# Patient Record
Sex: Female | Born: 1970 | Hispanic: Yes | Marital: Single | Smoking: Never smoker
Health system: Southern US, Community
[De-identification: ages and names within clinical notes are randomized; demographics above are authoritative.]

## PROBLEM LIST (undated history)

## (undated) DIAGNOSIS — G4730 Sleep apnea, unspecified: Secondary | ICD-10-CM

## (undated) DIAGNOSIS — G8929 Other chronic pain: Secondary | ICD-10-CM

## (undated) DIAGNOSIS — F419 Anxiety disorder, unspecified: Secondary | ICD-10-CM

## (undated) DIAGNOSIS — N2889 Other specified disorders of kidney and ureter: Secondary | ICD-10-CM

## (undated) DIAGNOSIS — J45909 Unspecified asthma, uncomplicated: Secondary | ICD-10-CM

## (undated) DIAGNOSIS — K219 Gastro-esophageal reflux disease without esophagitis: Secondary | ICD-10-CM

## (undated) DIAGNOSIS — L509 Urticaria, unspecified: Secondary | ICD-10-CM

## (undated) DIAGNOSIS — F4310 Post-traumatic stress disorder, unspecified: Secondary | ICD-10-CM

## (undated) DIAGNOSIS — D649 Anemia, unspecified: Secondary | ICD-10-CM

## (undated) DIAGNOSIS — T8859XA Other complications of anesthesia, initial encounter: Secondary | ICD-10-CM

## (undated) DIAGNOSIS — M329 Systemic lupus erythematosus, unspecified: Secondary | ICD-10-CM

## (undated) DIAGNOSIS — G43909 Migraine, unspecified, not intractable, without status migrainosus: Secondary | ICD-10-CM

## (undated) DIAGNOSIS — M24312 Pathological dislocation of left shoulder, not elsewhere classified: Secondary | ICD-10-CM

## (undated) DIAGNOSIS — A159 Respiratory tuberculosis unspecified: Secondary | ICD-10-CM

## (undated) DIAGNOSIS — C801 Malignant (primary) neoplasm, unspecified: Secondary | ICD-10-CM

## (undated) DIAGNOSIS — J189 Pneumonia, unspecified organism: Secondary | ICD-10-CM

## (undated) DIAGNOSIS — Q652 Congenital dislocation of hip, unspecified: Secondary | ICD-10-CM

## (undated) DIAGNOSIS — M810 Age-related osteoporosis without current pathological fracture: Secondary | ICD-10-CM

## (undated) DIAGNOSIS — M1990 Unspecified osteoarthritis, unspecified site: Secondary | ICD-10-CM

## (undated) DIAGNOSIS — F32A Depression, unspecified: Secondary | ICD-10-CM

## (undated) DIAGNOSIS — D509 Iron deficiency anemia, unspecified: Secondary | ICD-10-CM

## (undated) DIAGNOSIS — M24311 Pathological dislocation of right shoulder, not elsewhere classified: Secondary | ICD-10-CM

## (undated) HISTORY — DX: Gastro-esophageal reflux disease without esophagitis: K21.9

## (undated) HISTORY — DX: Anemia, unspecified: D64.9

## (undated) HISTORY — DX: Unspecified asthma, uncomplicated: J45.909

## (undated) HISTORY — DX: Iron deficiency anemia, unspecified: D50.9

## (undated) HISTORY — DX: Other specified disorders of kidney and ureter: N28.89

## (undated) HISTORY — DX: Urticaria, unspecified: L50.9

## (undated) HISTORY — DX: Other chronic pain: G89.29

## (undated) HISTORY — DX: Age-related osteoporosis without current pathological fracture: M81.0

## (undated) HISTORY — DX: Systemic lupus erythematosus, unspecified: M32.9

## (undated) HISTORY — PX: TOTAL HIP ARTHROPLASTY: SHX124

## (undated) HISTORY — PX: HIP SURGERY: SHX245

## (undated) HISTORY — DX: Post-traumatic stress disorder, unspecified: F43.10

## (undated) HISTORY — DX: Pathological dislocation of right shoulder, not elsewhere classified: M24.311

## (undated) HISTORY — DX: Migraine, unspecified, not intractable, without status migrainosus: G43.909

## (undated) HISTORY — DX: Pathological dislocation of right shoulder, not elsewhere classified: M24.312

## (undated) HISTORY — DX: Congenital dislocation of hip, unspecified: Q65.2

## (undated) HISTORY — DX: Sleep apnea, unspecified: G47.30

## (undated) HISTORY — DX: Anxiety disorder, unspecified: F41.9

---

## 2011-11-01 HISTORY — PX: STAPEDECTOMY: SHX2435

## 2012-03-08 HISTORY — PX: LAPAROSCOPIC GASTRIC SLEEVE RESECTION: SHX5895

## 2015-03-09 DIAGNOSIS — N2889 Other specified disorders of kidney and ureter: Secondary | ICD-10-CM

## 2015-03-09 HISTORY — DX: Other specified disorders of kidney and ureter: N28.89

## 2016-11-15 ENCOUNTER — Encounter: Payer: Self-pay | Admitting: Family Medicine

## 2016-11-15 ENCOUNTER — Ambulatory Visit (INDEPENDENT_AMBULATORY_CARE_PROVIDER_SITE_OTHER): Payer: Medicare Other | Admitting: Family Medicine

## 2016-11-15 DIAGNOSIS — Z23 Encounter for immunization: Secondary | ICD-10-CM

## 2016-11-15 DIAGNOSIS — G43909 Migraine, unspecified, not intractable, without status migrainosus: Secondary | ICD-10-CM | POA: Insufficient documentation

## 2016-11-15 DIAGNOSIS — M329 Systemic lupus erythematosus, unspecified: Secondary | ICD-10-CM | POA: Diagnosis not present

## 2016-11-15 DIAGNOSIS — D649 Anemia, unspecified: Secondary | ICD-10-CM | POA: Diagnosis not present

## 2016-11-15 DIAGNOSIS — G43809 Other migraine, not intractable, without status migrainosus: Secondary | ICD-10-CM | POA: Diagnosis not present

## 2016-11-15 MED ORDER — TOPIRAMATE 100 MG PO TABS: 100.0000 mg | ORAL_TABLET | Freq: Two times a day (BID) | ORAL | 0 refills | Status: DC

## 2016-11-15 MED ORDER — HYDROXYCHLOROQUINE SULFATE 200 MG PO TABS: 200.0000 mg | ORAL_TABLET | Freq: Every day | ORAL | 0 refills | Status: DC

## 2016-11-15 MED ORDER — PREDNISONE 10 MG PO TABS: 40.0000 mg | ORAL_TABLET | Freq: Every day | ORAL | 0 refills | Status: AC

## 2016-11-15 NOTE — Progress Notes (Signed)
Subjective:  Melissa Gaines is a 46 y.o. female who presents to the [facility] today to establish as a new patient.   HPI:  Establish care - Recently moved to [place] from [place] has multiple medical conditions and her previous PCP had given her paper prescriptions to see several specialists which she has with her today, including: rheumatology, PT, pain clinic, hematology - Needs refills on medications, is about to run out. States is on plaquenil, prednisone and topamax all chronically. Is especially concerned about the prednisone since she states she is not in a lupus flare and feels like the steroids are making her gain weight - Is requesting vitb12 injection as she states that she was getting these every few weeks from her previous PCP and she is overdue. She states this was for her anemia and that she was told that she is iron deficient as well.  ROS: Per HPI, otherwise a 14 point review of systems was performed and was negative  PMH:  The following were reviewed and entered/updated in epic: Past Medical History:  Diagnosis Date  . Anemia   . Asthma   . Chronic pain   . Congenital hip dislocation   . GERD (gastroesophageal reflux disease)   . Kidney mass 2017  . Migraine   . Osteoporosis   . Sleep apnea   . Systemic lupus erythematosus ([place])    Patient Active Problem List   Diagnosis Date Noted  . Systemic lupus erythematosus ([place])   . Anemia   . Migraine    Past Surgical History:  Procedure Laterality Date  . CESAREAN SECTION W/BTL  2010  . EAR MASTOIDECTOMY W/ COCHLEAR IMPLANT W/ LANDMARK  2014  . HIP SURGERY     in childhood for congenital hip dislocation with repeat b/l hip surgery in 2002, L hip surgery again in 2013  . LAPAROSCOPIC GASTRIC SLEEVE RESECTION  2014    Family History  Problem Relation Age of Onset  . Asthma Mother   . Liver cancer Mother   . Heart Problems Mother        heart attack and heart failure  . Kidney Stones Mother   .  Osteoporosis Mother   . Stroke Mother   . Depression Mother   . Anxiety disorder Mother   . Kidney Stones Father   . Hernia Father   . Depression Sister   . Anxiety disorder Sister     Medications- reviewed and updated Current Outpatient Prescriptions  Medication Sig Dispense Refill  . hydroxychloroquine (PLAQUENIL) 200 MG tablet Take 1 tablet (200 mg total) by mouth daily. 30 tablet 0  . predniSONE (DELTASONE) 10 MG tablet Take 4 tablets (40 mg total) by mouth daily with breakfast. 80 tablet 0  . topiramate (TOPAMAX) 100 MG tablet Take 1 tablet (100 mg total) by mouth 2 (two) times daily. 60 tablet 0   No current facility-administered medications for this visit.     Allergies-reviewed and updated Allergies  Allergen Reactions  . Fish Allergy     To salmon and tilapia  NKDA  Social History   Social History  . Marital status: Single    Spouse name: N/A  . Number of children: 2  . Years of education: some high school   Occupational History  . disabled    Social History Main Topics  . Smoking status: Never Smoker  . Smokeless tobacco: Never Used  . Alcohol use No  . Drug use: No  . Sexual activity: Not  Asked   Other Topics Concern  . None   Social History Narrative   Lives with her 2 children, dog, fish, hamsters.    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    The person she would like to make her medical decisions for her is Bobbye Morton, a foster brother.   She likes to spend time with her children and write poems.    Objective:  Physical Exam: BP 102/70   Pulse 91   Temp 98 F (36.7 C) (Oral)   Ht 4' 10.5" (1.486 m)   Wt 177 lb (80.3 kg)   LMP 10/25/2016   SpO2 96%   BMI 36.36 kg/m   Gen: NAD, resting comfortably CV: RRR with no murmurs appreciated Pulm: NWOB, CTAB with no crackles, wheezes, or rhonchi GI: Normal bowel sounds present. Soft, Nontender, Nondistended. MSK: no edema, cyanosis, or clubbing noted Skin: warm,  dry Neuro: grossly normal, moves all extremities Psych: Normal affect and thought content   Assessment/Plan:  Systemic lupus erythematosus ([place]) Request records from previous PCP and rheumatologist. Check baseline bloodwork today including ANA. Refill for plaquenil and prednisone given today. Discussed possibly tapering off prednisone depending on what information her patient records show. Will hold off on rheumatology referral at this time until can obtain more information.   Anemia Patient has a self reported history of both iron and vitb12 deficiency. Will get records from previous PCP and check CBC today before restarting any supplementation given the confusing and complicated history. Will not refer to hematology at this time despite patient request to do so. She voiced good understanding that understanding her medical history is the first step.  Migraine Patient requested refill for her topamax and seemed reasonably sure of her dosing. Since she does not have any headaches and seems to be doing well, topamax refilled today. Will follow up on PCP records regarding this.  Establish care  - ROI signed for previous PCP, opthalmologist, vein specialist, urology, allergy, rheumatologist, pulmonologist, GYN, orthopedics, ENT.  - Patient given advance directive information given her special wishes   Follow up in 3-4 weeks after medical records obtained to discuss next steps for chronic disease management.   Bufford Lope, DO PGY-2, [facility] 11/15/2016 9:26 AM

## 2016-11-15 NOTE — Patient Instructions (Signed)
It was good to see you today! Thank you for establishing with [place] here.  We will get records and get baseline bloodwork today.  We may need to taper you off the prednisone at your next visit.  Please complete an advance directive.   Please check-out at the front desk before leaving the clinic. Make an appointment in  3-4 weeks for follow up so we can get a better idea of your medical history after receiving some medical records.  We are checking some labs today. If results require attention, either myself or my nurse will get in touch with you. If everything is normal, you will get a letter in the mail or a message in My Chart. Please give [place] a call if you do not hear from [place] after 2 weeks.   Please bring all of your medications with you to each visit.   Sign up for My Chart to have easy access to your labs results, and communication with your primary care physician.  Feel free to call with any questions or concerns at any time, at [phone].   Take care,  Dr. Bufford Lope, [place]

## 2016-11-16 LAB — BASIC METABOLIC PANEL
BUN / CREAT RATIO: 13 (ref 9–23)
BUN: 6 mg/dL (ref 6–24)
CHLORIDE: 100 mmol/L (ref 96–106)
CO2: 24 mmol/L (ref 20–29)
Calcium: 8.8 mg/dL (ref 8.7–10.2)
Creatinine, Ser: 0.46 mg/dL — ABNORMAL LOW (ref 0.57–1.00)
GFR calc Af Amer: 139 mL/min/{1.73_m2} (ref >59)
GFR calc non Af Amer: 121 mL/min/{1.73_m2} (ref >59)
GLUCOSE: 79 mg/dL (ref 65–99)
POTASSIUM: 4.4 mmol/L (ref 3.5–5.2)
SODIUM: 141 mmol/L (ref 134–144)

## 2016-11-16 LAB — ANEMIA PROFILE B
BASOS ABS: 0 10*3/uL (ref 0.0–0.2)
Basos: 0 %
EOS (ABSOLUTE): 0.1 10*3/uL (ref 0.0–0.4)
EOS: 1 %
Ferritin: 27 ng/mL (ref 15–150)
Folate: 6.5 ng/mL (ref >3.0)
HEMOGLOBIN: 13.5 g/dL (ref 11.1–15.9)
Hematocrit: 42.6 % (ref 34.0–46.6)
IMMATURE GRANS (ABS): 0 10*3/uL (ref 0.0–0.1)
IMMATURE GRANULOCYTES: 0 %
IRON SATURATION: 35 % (ref 15–55)
IRON: 138 ug/dL (ref 27–159)
LYMPHS: 35 %
Lymphocytes Absolute: 2.2 10*3/uL (ref 0.7–3.1)
MCH: 24.9 pg — AB (ref 26.6–33.0)
MCHC: 31.7 g/dL (ref 31.5–35.7)
MCV: 79 fL (ref 79–97)
MONOCYTES: 6 %
Monocytes Absolute: 0.4 10*3/uL (ref 0.1–0.9)
NEUTROS PCT: 58 %
Neutrophils Absolute: 3.7 10*3/uL (ref 1.4–7.0)
Platelets: 253 10*3/uL (ref 150–379)
RBC: 5.42 x10E6/uL — ABNORMAL HIGH (ref 3.77–5.28)
RDW: 21.7 % — ABNORMAL HIGH (ref 12.3–15.4)
RETIC CT PCT: 1.7 % (ref 0.6–2.6)
TIBC: 398 ug/dL (ref 250–450)
UIBC: 260 ug/dL (ref 131–425)
Vitamin B-12: 327 pg/mL (ref 232–1245)
WBC: 6.4 10*3/uL (ref 3.4–10.8)

## 2016-11-16 LAB — ANA: ANA: POSITIVE — AB

## 2016-11-16 LAB — VITAMIN D 25 HYDROXY (VIT D DEFICIENCY, FRACTURES): VIT D 25 HYDROXY: 18.1 ng/mL — AB (ref 30.0–100.0)

## 2016-11-17 ENCOUNTER — Encounter: Payer: Self-pay | Admitting: Family Medicine

## 2016-11-17 NOTE — Assessment & Plan Note (Signed)
Request records from previous PCP and rheumatologist. Check baseline bloodwork today including ANA. Refill for plaquenil and prednisone given today. Discussed possibly tapering off prednisone depending on what information her patient records show. Will hold off on rheumatology referral at this time until can obtain more information.

## 2016-11-17 NOTE — Assessment & Plan Note (Signed)
Patient has a self reported history of both iron and vitb12 deficiency. Will get records from previous PCP and check CBC today before restarting any supplementation given the confusing and complicated history. Will not refer to hematology at this time despite patient request to do so. She voiced good understanding that understanding her medical history is the first step.

## 2016-11-17 NOTE — Assessment & Plan Note (Signed)
Patient requested refill for her topamax and seemed reasonably sure of her dosing. Since she does not have any headaches and seems to be doing well, topamax refilled today. Will follow up on PCP records regarding this.

## 2016-11-18 ENCOUNTER — Encounter: Payer: Self-pay | Admitting: Family Medicine

## 2016-11-22 ENCOUNTER — Other Ambulatory Visit: Payer: Self-pay | Admitting: Family Medicine

## 2016-11-22 DIAGNOSIS — M329 Systemic lupus erythematosus, unspecified: Secondary | ICD-10-CM

## 2016-12-06 ENCOUNTER — Ambulatory Visit (INDEPENDENT_AMBULATORY_CARE_PROVIDER_SITE_OTHER): Payer: Medicare Other | Admitting: Family Medicine

## 2016-12-06 ENCOUNTER — Encounter: Payer: Self-pay | Admitting: Family Medicine

## 2016-12-06 VITALS — BP 100/70 | HR 84 | Temp 98.2°F | Ht 58.5 in | Wt 171.8 lb

## 2016-12-06 DIAGNOSIS — R42 Dizziness and giddiness: Secondary | ICD-10-CM | POA: Diagnosis not present

## 2016-12-06 NOTE — Patient Instructions (Signed)
It was good to see you today!  I think you are having side effects from your medications - Please taper off the prednisone. Take 30mg  for 2 days, then go to 20mg  for 2 days, then 10mg  for 3 days, then off. I want you to follow up with me next week - If you are still having symptoms then we may need to decrease your topamax dose - get some sleep   Please check-out at the front desk before leaving the clinic. Make an appointment in about 1 week for recheck.   Please bring all of your medications with you to each visit.   Sign up for My Chart to have easy access to your labs results, and communication with your primary care physician.  Feel free to call with any questions or concerns at any time, at [phone].   Take care,  Dr. Bufford Lope, [place]

## 2016-12-06 NOTE — Progress Notes (Signed)
    Subjective:  Melissa Gaines is a 46 y.o. female who presents to the [facility] today with a chief complaint of dizziness.   HPI:  Dizziness - here for regularly scheduled visit and did not have acute concern prior to this appointment but has been feeling dizzy today - states her dizziness is both acute and chronic, does not have a specific time frame but thinks is related to staying up all last night with her sick child. She also states that both her children have been sick for several days and she has gotten very little sleep over the last 2-3 nights - Has not fallen - no vision changes, weakness, numbness or tingling.  ROS: Per HPI  Objective:  Physical Exam: BP 100/70   Pulse 84   Temp 98.2 F (36.8 C) (Oral)   Ht 4' 10.5" (1.486 m)   Wt 171 lb 12.8 oz (77.9 kg)   SpO2 96%   BMI 35.30 kg/m   Gen: NAD, resting comfortably CV: RRR with no murmurs appreciated Pulm: NWOB, CTAB with no crackles, wheezes, or rhonchi GI: Normal bowel sounds present. Soft, Nontender, Nondistended. MSK: no edema, cyanosis, or clubbing noted Skin: warm, dry Neuro: grossly normal, moves all extremities. CN II-XII. Intact finger to nose bilaterally. Gait normal. DTR 2+. Psych: Normal affect and thought content   Assessment/Plan:  Dizziness Patient is well appearing on exam with a completely normal neuro exam. Dizziness is likely due to combination of medication side effect and sleep deprivation. Patient was recently restarted on her prednisone and topamax after being out while she transferred her care to here at [facility] from [place]. Will have patient taper off prednisone and return in 1 week for follow up. Suspect will have to decrease topamax dose to 100mg  once daily since doses >100 mg daily have shown no additional benefit although per her records from [place] she had been on 100mg  BID and tolerating well in the past.  Discussed visit with Melissa Gaines today  Melissa Lope, DO PGY-2, [place]  [facility] 12/06/2016 9:46 AM

## 2016-12-14 DIAGNOSIS — R42 Dizziness and giddiness: Secondary | ICD-10-CM | POA: Insufficient documentation

## 2016-12-14 NOTE — Assessment & Plan Note (Signed)
Patient is well appearing on exam with a completely normal neuro exam. Dizziness is likely due to combination of medication side effect and sleep deprivation. Patient was recently restarted on her prednisone and topamax after being out while she transferred her care to here at [facility] from [place]. Will have patient taper off prednisone and return in 1 week for follow up. Suspect will have to decrease topamax dose to 100mg  once daily since doses >100 mg daily have shown no additional benefit although per her records from [place] she had been on 100mg  BID and tolerating well in the past.

## 2016-12-16 ENCOUNTER — Ambulatory Visit (INDEPENDENT_AMBULATORY_CARE_PROVIDER_SITE_OTHER): Payer: Medicare Other | Admitting: Family Medicine

## 2016-12-16 ENCOUNTER — Encounter: Payer: Self-pay | Admitting: Family Medicine

## 2016-12-16 ENCOUNTER — Other Ambulatory Visit (HOSPITAL_COMMUNITY)
Admission: RE | Admit: 2016-12-16 | Discharge: 2016-12-16 | Disposition: A | Discharge status: Home / Self Care | Payer: Medicare Other | Source: Ambulatory Visit | Attending: Family Medicine | Admitting: Family Medicine

## 2016-12-16 VITALS — BP 120/80 | HR 86 | Temp 97.8°F | Ht 59.0 in | Wt 171.0 lb

## 2016-12-16 DIAGNOSIS — Z124 Encounter for screening for malignant neoplasm of cervix: Secondary | ICD-10-CM | POA: Diagnosis not present

## 2016-12-16 DIAGNOSIS — Z23 Encounter for immunization: Secondary | ICD-10-CM | POA: Diagnosis present

## 2016-12-16 DIAGNOSIS — E559 Vitamin D deficiency, unspecified: Secondary | ICD-10-CM | POA: Insufficient documentation

## 2016-12-16 DIAGNOSIS — G43809 Other migraine, not intractable, without status migrainosus: Secondary | ICD-10-CM | POA: Diagnosis not present

## 2016-12-16 DIAGNOSIS — R42 Dizziness and giddiness: Secondary | ICD-10-CM

## 2016-12-16 MED ORDER — TOPIRAMATE 100 MG PO TABS: 100.0000 mg | ORAL_TABLET | Freq: Every day | ORAL | 2 refills | Status: DC

## 2016-12-16 MED ORDER — VITAMIN D (ERGOCALCIFEROL) 1.25 MG (50000 UNIT) PO CAPS: 50000.0000 [IU] | ORAL_CAPSULE | ORAL | 0 refills | Status: DC

## 2016-12-16 NOTE — Patient Instructions (Addendum)
It was good to see you today!  We discussed decreasing your topamax, take 100mg  daily.  Start taking vit D supplementation 1 pill a week for the next 6-8 weeks, then we will recheck your levels.   Tetanus booster was given today.  We checked a pap smear today, If results require attention, either myself or my nurse will get in touch with you. If everything is normal, you will get a letter in the mail or a message in My Chart. Please give [place] a call if you do not hear from [place] after 2 weeks.  Please check-out at the front desk before leaving the clinic. Make an appointment in  6-8 weeks after starting vit D to recheck your levels and to follow up on your headaches.   Please bring all of your medications with you to each visit.   Sign up for My Chart to have easy access to your labs results, and communication with your primary care physician.  Feel free to call with any questions or concerns at any time, at [phone].   Take care,  Dr. Bufford Lope, [place]

## 2016-12-16 NOTE — Progress Notes (Signed)
    Subjective:  Melissa Gaines is a 46 y.o. female who presents to the [facility] today for follow up on dizziness  HPI:  Dizziness - Feels much improved after getting some rest. Dizziness has resolved. - Recently saw eye doctor or is seeing eye doctor, uncertain but someone told her that she might have cataracts which she thinks was contributing to her dizziness - Has tapered off prednisone, last dose was on 12/09/16 - no acute vision changes, weakness, numbness or tingling - does have her chronic intermittent headaches/migraines that have not worsened   ROS: Per HPI  Objective:  Physical Exam: BP 120/80   Pulse 86   Temp 97.8 F (36.6 C) (Oral)   Ht 4\' 11"  (1.499 m)   Wt 171 lb (77.6 kg)   LMP 11/16/2016   SpO2 97%   BMI 34.54 kg/m   Gen: NAD, resting comfortably CV: RRR with no murmurs appreciated Pulm: NWOB, CTAB with no crackles, wheezes, or rhonchi GI: Normal bowel sounds present. Soft, Nontender, Nondistended. Pelvic exam: normal external genitalia, vulva, vagina, cervix, uterus and adnexa. MSK: no edema, cyanosis, or clubbing noted Skin: warm, dry Neuro: grossly normal, moves all extremities Psych: Normal affect and thought content    Assessment/Plan:  Dizziness Resolved. However, discussed with patient that may be of benefit to trial her topamax at more appropriate dosing of 100mg  qd as suspect it may be playing a role in her intermittent nonspecific symptoms and patient's desire to be on less medication. If patient's migraines worsen, discussed resuming the 100mg  BID dosing.  Vitamin D deficiency Given vitD level of 18.1 on 11/15/16. Will start treatment with 50,000U weekly for 6 weeks with recheck of vitD levels.  Health maintenance - tetanus booster given today - pap smear collected today   Bufford Lope, DO PGY-2, [place] [facility] 12/16/2016 10:10 AM

## 2016-12-16 NOTE — Assessment & Plan Note (Signed)
Resolved. However, discussed with patient that may be of benefit to trial her topamax at more appropriate dosing of 100mg  qd as suspect it may be playing a role in her intermittent nonspecific symptoms and patient's desire to be on less medication. If patient's migraines worsen, discussed resuming the 100mg  BID dosing.

## 2016-12-16 NOTE — Assessment & Plan Note (Signed)
Given vitD level of 18.1 on 11/15/16. Will start treatment with 50,000U weekly for 6 weeks with recheck of vitD levels.

## 2016-12-21 ENCOUNTER — Encounter: Payer: Self-pay | Admitting: Family Medicine

## 2016-12-21 DIAGNOSIS — R479 Unspecified speech disturbances: Secondary | ICD-10-CM | POA: Insufficient documentation

## 2016-12-21 LAB — CYTOLOGY - PAP
Adequacy: ABSENT
DIAGNOSIS: NEGATIVE
HPV: NOT DETECTED

## 2016-12-29 ENCOUNTER — Encounter: Payer: Self-pay | Admitting: Family Medicine

## 2017-01-26 ENCOUNTER — Encounter: Payer: Self-pay | Admitting: Family Medicine

## 2017-01-26 DIAGNOSIS — N2889 Other specified disorders of kidney and ureter: Secondary | ICD-10-CM | POA: Insufficient documentation

## 2017-02-04 ENCOUNTER — Ambulatory Visit: Payer: Medicare Other | Admitting: Family Medicine

## 2017-02-04 ENCOUNTER — Other Ambulatory Visit: Payer: Self-pay

## 2017-02-04 ENCOUNTER — Encounter: Payer: Self-pay | Admitting: Family Medicine

## 2017-02-04 VITALS — BP 98/58 | HR 82 | Temp 98.6°F | Wt 182.0 lb

## 2017-02-04 DIAGNOSIS — G43809 Other migraine, not intractable, without status migrainosus: Secondary | ICD-10-CM

## 2017-02-04 DIAGNOSIS — G44209 Tension-type headache, unspecified, not intractable: Secondary | ICD-10-CM

## 2017-02-04 DIAGNOSIS — E559 Vitamin D deficiency, unspecified: Secondary | ICD-10-CM

## 2017-02-04 NOTE — Progress Notes (Signed)
    Subjective:  Melissa Gaines is a 46 y.o. female who presents to the [facility] today with a chief complaint of headache.   HPI:  Headache - States her dizziness has stayed gone since last appointment with me but now feels like she's having more headaches - Headaches are different than her chronic migraines, these she feels in back of her neck and sides of her head - worse with stress, has been really struggling with communicating with her children.has not been able to go to positive parenting classes because she lost the contact information - no vision changes, lightheadness, nausea or vomiting   ROS: Per HPI  Objective:  Physical Exam: BP (!) 98/58   Pulse 82   Temp 98.6 F (37 C) (Oral)   Wt 182 lb (82.6 kg)   LMP 01/06/2017   SpO2 98%   BMI 36.76 kg/m   Gen: NAD, resting comfortably HEENT: [place], AT. Pupils equal, round and reactive. EOMI. Neck: cervical paraspinal muscles and trapezius muscles are tense and mildly tender to palpation CV: RRR with no murmurs appreciated Pulm: NWOB, CTAB with no crackles, wheezes, or rhonchi GI: Normal bowel sounds present. Soft, Nontender, Nondistended. MSK: no edema, cyanosis, or clubbing noted Skin: warm, dry Neuro: grossly normal, moves all extremities Psych: Normal affect and thought content   Assessment/Plan:  Tension headache Based on history, these more recent headaches appear to be tension headaches associated with increased stress with her children. Migraines appear to be under good control at reduced topamax dose of 100mg  qd. Advised symptomatic management of tension headache at home and discussed ways in which to reduce stress including signing up for positive parenting classes (info given and helped patient record in her cellphone today). Follow up in 3 months.    Bufford Lope, DO PGY-2, [place] [facility] 02/04/2017 10:29 AM

## 2017-02-04 NOTE — Patient Instructions (Addendum)
[facility]: [place], [place] https://www.[facility].org Holistic services for children and adolescents including therapy, substance abuse treatment, housing for pregnant adolescents, and parental education There is also a location at [facility], which is closer to the bus line.  [facility] [place]:  [phone] [address] [place] http://www.fspcares.org/  Therapy for children and adults. Additional services for child abuse, domestic violence, substance use, and financial counseling.  [facility]: [place], [address] Takes Medicaid and provides limited number of parenting classes using positive parenting techniques.  Tension Headache A tension headache is pain, pressure, or aching that is felt over the front and sides of your head. These headaches can last from 30 minutes to several days. Follow these instructions at home: Managing pain  Take over-the-counter and prescription medicines only as told by your doctor.  Lie down in a dark, quiet room when you have a headache.  If directed, apply ice to your head and neck area: ? Put ice in a plastic bag. ? Place a towel between your skin and the bag. ? Leave the ice on for 20 minutes, 2-3 times per day.  Use a heating pad or a hot shower to apply heat to your head and neck area as told by your doctor. Eating and drinking  Eat meals on a regular schedule.  Do not drink a lot of alcohol.  Do not use a lot of caffeine, or stop using caffeine. General instructions  Keep all follow-up visits as told by your doctor. This is important.  Keep a journal to find out if certain things bring on headaches. For example, write down: ? What you eat and drink. ? How much sleep you get. ? Any change to your diet or medicines.  Try getting a massage, or doing other things that help you to relax.  Lessen stress.  Sit up straight. Do  not tighten (tense) your muscles.  Do not use tobacco products. This includes cigarettes, chewing tobacco, or e-cigarettes. If you need help quitting, ask your doctor.  Exercise regularly as told by your doctor.  Get enough sleep. This may mean 7-9 hours of sleep. Contact a doctor if:  Your symptoms are not helped by medicine.  You have a headache that feels different from your usual headache.  You feel sick to your stomach (nauseous) or you throw up (vomit).  You have a fever. Get help right away if:  Your headache becomes very bad.  You keep throwing up.  You have a stiff neck.  You have trouble seeing.  You have trouble speaking.  You have pain in your eye or ear.  Your muscles are weak or you lose muscle control.  You lose your balance or you have trouble walking.  You feel like you will pass out (faint) or you pass out.  You have confusion. This information is not intended to replace advice given to you by your health care provider. Make sure you discuss any questions you have with your health care provider. Document Released: 05/19/2009 Document Revised: 10/23/2015 Document Reviewed: 06/17/2014 Elsevier Interactive Patient Education  [employer].

## 2017-02-05 LAB — VITAMIN D 25 HYDROXY (VIT D DEFICIENCY, FRACTURES): VIT D 25 HYDROXY: 19.2 ng/mL — AB (ref 30.0–100.0)

## 2017-02-07 DIAGNOSIS — G44209 Tension-type headache, unspecified, not intractable: Secondary | ICD-10-CM | POA: Insufficient documentation

## 2017-02-07 NOTE — Assessment & Plan Note (Signed)
Based on history, these more recent headaches appear to be tension headaches associated with increased stress with her children. Migraines appear to be under good control at reduced topamax dose of 100mg  qd. Advised symptomatic management of tension headache at home and discussed ways in which to reduce stress including signing up for positive parenting classes (info given and helped patient record in her cellphone today). Follow up in 3 months.

## 2017-02-07 NOTE — Assessment & Plan Note (Deleted)
Based on history, these more recent headaches appear to be tension headaches associated with increased stress with her children. Migraines appear to be under good control at reduced topamax dose of 100mg  qd. Advised symptomatic management of tension headache at home and discussed ways in which to reduce stress including signing up for positive parenting classes (info given and helped patient record in her cellphone today). Follow up in 3 months.

## 2017-05-23 ENCOUNTER — Ambulatory Visit (INDEPENDENT_AMBULATORY_CARE_PROVIDER_SITE_OTHER): Payer: Medicare Other | Admitting: Family Medicine

## 2017-05-23 ENCOUNTER — Encounter: Payer: Self-pay | Admitting: Family Medicine

## 2017-05-23 ENCOUNTER — Other Ambulatory Visit: Payer: Self-pay

## 2017-05-23 VITALS — BP 122/64 | HR 74 | Temp 97.9°F | Ht 59.0 in | Wt 201.0 lb

## 2017-05-23 DIAGNOSIS — G8929 Other chronic pain: Secondary | ICD-10-CM | POA: Insufficient documentation

## 2017-05-23 DIAGNOSIS — M25561 Pain in right knee: Secondary | ICD-10-CM

## 2017-05-23 MED ORDER — DICLOFENAC SODIUM 1 % TD GEL: 2.0000 g | Freq: Four times a day (QID) | TRANSDERMAL | 0 refills | Status: DC

## 2017-05-23 MED ORDER — IBUPROFEN 600 MG PO TABS: 600.0000 mg | ORAL_TABLET | Freq: Four times a day (QID) | ORAL | 0 refills | Status: DC | PRN

## 2017-05-23 NOTE — Assessment & Plan Note (Signed)
Started after fall 1 month ago, now acutely worsened with continued use. Patient able to bear weight/walk so will treat symptomatically with NSAIDs, ice and home exercises. Discussed with patient that if no improvement with conservative management that she should return for reevaluation at which time would consider xray.

## 2017-05-23 NOTE — Patient Instructions (Signed)
It was good to see you today!  For your knee,  - taken ibuprofen and use the voltaren gel, these have anti-inflammatory effects which will hopefully help your pain. - Ice is good - Knee brace will probably not help. - Once pain is a little better start exercises and stretches.  If not better in the next few weeks come back for re-evaluation and possibly an xray.   Sign up for My Chart to have easy access to your labs results, and communication with your primary care physician.  Feel free to call with any questions or concerns at any time, at [phone].   Take care,  Dr. Bufford Lope, DO [facility]   Knee Exercises Ask your health care provider which exercises are safe for you. Do exercises exactly as told by your health care provider and adjust them as directed. It is normal to feel mild stretching, pulling, tightness, or discomfort as you do these exercises, but you should stop right away if you feel sudden pain or your pain gets worse.Do not begin these exercises until told by your health care provider. STRETCHING AND RANGE OF MOTION EXERCISES These exercises warm up your muscles and joints and improve the movement and flexibility of your knee. These exercises also help to relieve pain, numbness, and tingling. Exercise A: Knee Extension, Prone 1. Lie on your abdomen on a bed. 2. Place your left / right knee just beyond the edge of the surface so your knee is not on the bed. You can put a towel under your left / right thigh just above your knee for comfort. 3. Relax your leg muscles and allow gravity to straighten your knee. You should feel a stretch behind your left / right knee. 4. Hold this position for __________ seconds. 5. Scoot up so your knee is supported between repetitions. Repeat __________ times. Complete this stretch __________ times a day. Exercise B: Knee Flexion, Active  1. Lie on your back with both knees straight. If this causes back discomfort,  bend your left / right knee so your foot is flat on the floor. 2. Slowly slide your left / right heel back toward your buttocks until you feel a gentle stretch in the front of your knee or thigh. 3. Hold this position for __________ seconds. 4. Slowly slide your left / right heel back to the starting position. Repeat __________ times. Complete this exercise __________ times a day. Exercise C: Quadriceps, Prone  1. Lie on your abdomen on a firm surface, such as a bed or padded floor. 2. Bend your left / right knee and hold your ankle. If you cannot reach your ankle or pant leg, loop a belt around your foot and grab the belt instead. 3. Gently pull your heel toward your buttocks. Your knee should not slide out to the side. You should feel a stretch in the front of your thigh and knee. 4. Hold this position for __________ seconds. Repeat __________ times. Complete this stretch __________ times a day. Exercise D: Hamstring, Supine 1. Lie on your back. 2. Loop a belt or towel over the ball of your left / right foot. The ball of your foot is on the walking surface, right under your toes. 3. Straighten your left / right knee and slowly pull on the belt to raise your leg until you feel a gentle stretch behind your knee. ? Do not let your left / right knee bend while you do this. ? Keep your other  leg flat on the floor. 4. Hold this position for __________ seconds. Repeat __________ times. Complete this stretch __________ times a day. STRENGTHENING EXERCISES These exercises build strength and endurance in your knee. Endurance is the ability to use your muscles for a long time, even after they get tired. Exercise E: Quadriceps, Isometric  1. Lie on your back with your left / right leg extended and your other knee bent. Put a rolled towel or small pillow under your knee if told by your health care provider. 2. Slowly tense the muscles in the front of your left / right thigh. You should see your kneecap  slide up toward your hip or see increased dimpling just above the knee. This motion will push the back of the knee toward the floor. 3. For __________ seconds, keep the muscle as tight as you can without increasing your pain. 4. Relax the muscles slowly and completely. Repeat __________ times. Complete this exercise __________ times a day. Exercise F: Straight Leg Raises - Quadriceps 1. Lie on your back with your left / right leg extended and your other knee bent. 2. Tense the muscles in the front of your left / right thigh. You should see your kneecap slide up or see increased dimpling just above the knee. Your thigh may even shake a bit. 3. Keep these muscles tight as you raise your leg 4-6 inches (10-15 cm) off the floor. Do not let your knee bend. 4. Hold this position for __________ seconds. 5. Keep these muscles tense as you lower your leg. 6. Relax your muscles slowly and completely after each repetition. Repeat __________ times. Complete this exercise __________ times a day. Exercise G: Hamstring, Isometric 1. Lie on your back on a firm surface. 2. Bend your left / right knee approximately __________ degrees. 3. Dig your left / right heel into the surface as if you are trying to pull it toward your buttocks. Tighten the muscles in the back of your thighs to dig as hard as you can without increasing any pain. 4. Hold this position for __________ seconds. 5. Release the tension gradually and allow your muscles to relax completely for __________ seconds after each repetition. Repeat __________ times. Complete this exercise __________ times a day. Exercise H: Hamstring Curls  If told by your health care provider, do this exercise while wearing ankle weights. Begin with __________ weights. Then increase the weight by 1 lb (0.5 kg) increments. Do not wear ankle weights that are more than __________. 1. Lie on your abdomen with your legs straight. 2. Bend your left / right knee as far as you  can without feeling pain. Keep your hips flat against the floor. 3. Hold this position for __________ seconds. 4. Slowly lower your leg to the starting position.  Repeat __________ times. Complete this exercise __________ times a day. Exercise I: Squats (Quadriceps) 1. Stand in front of a table, with your feet and knees pointing straight ahead. You may rest your hands on the table for balance but not for support. 2. Slowly bend your knees and lower your hips like you are going to sit in a chair. ? Keep your weight over your heels, not over your toes. ? Keep your lower legs upright so they are parallel with the table legs. ? Do not let your hips go lower than your knees. ? Do not bend lower than told by your health care provider. ? If your knee pain increases, do not bend as low. 3. Hold the  squat position for __________ seconds. 4. Slowly push with your legs to return to standing. Do not use your hands to pull yourself to standing. Repeat __________ times. Complete this exercise __________ times a day. Exercise J: Wall Slides (Quadriceps)  1. Lean your back against a smooth wall or door while you walk your feet out 18-24 inches (46-61 cm) from it. 2. Place your feet hip-width apart. 3. Slowly slide down the wall or door until your knees bend __________ degrees. Keep your knees over your heels, not over your toes. Keep your knees in line with your hips. 4. Hold for __________ seconds. Repeat __________ times. Complete this exercise __________ times a day. Exercise K: Straight Leg Raises - Hip Abductors 1. Lie on your side with your left / right leg in the top position. Lie so your head, shoulder, knee, and hip line up. You may bend your bottom knee to help you keep your balance. 2. Roll your hips slightly forward so your hips are stacked directly over each other and your left / right knee is facing forward. 3. Leading with your heel, lift your top leg 4-6 inches (10-15 cm). You should feel  the muscles in your outer hip lifting. ? Do not let your foot drift forward. ? Do not let your knee roll toward the ceiling. 4. Hold this position for __________ seconds. 5. Slowly return your leg to the starting position. 6. Let your muscles relax completely after each repetition. Repeat __________ times. Complete this exercise __________ times a day. Exercise L: Straight Leg Raises - Hip Extensors 1. Lie on your abdomen on a firm surface. You can put a pillow under your hips if that is more comfortable. 2. Tense the muscles in your buttocks and lift your left / right leg about 4-6 inches (10-15 cm). Keep your knee straight as you lift your leg. 3. Hold this position for __________ seconds. 4. Slowly lower your leg to the starting position. 5. Let your leg relax completely after each repetition. Repeat __________ times. Complete this exercise __________ times a day. This information is not intended to replace advice given to you by your health care provider. Make sure you discuss any questions you have with your health care provider. Document Released: 01/06/2005 Document Revised: 11/17/2015 Document Reviewed: 12/29/2014 Elsevier Interactive Patient Education  2018 [employer].

## 2017-05-23 NOTE — Progress Notes (Signed)
    Subjective:  Melissa Gaines is a 47 y.o. female who presents to the [facility] today with a chief complaint of R knee pain.   HPI:  R knee pain for the last 1 month, acutely worsening over the last 2 weeks. Having some clicking sensation intermittently. Having difficulty going down the stairs or getting in/out of car. Pain is achy at baseline with sharp almost electric feeling when clicks.  Fell down on it when it was raining because slipped in mud about 1 month ago. Did not have popping sensation at the time.  Tried wrapping it, also her friend's voltaren gel which a little. Also tried aspirin 325mg  without much relief.  No numbness or weakness Able to walk and bear weight on it. No falls.  ROS: Per HPI  Objective:  Physical Exam: BP 122/64   Pulse 74   Temp 97.9 F (36.6 C) (Oral)   Ht 4\' 11"  (1.499 m)   Wt 201 lb (91.2 kg)   LMP 04/25/2017 (Approximate)   SpO2 98%   BMI 40.60 kg/m   Gen: NAD, resting comfortably MSK: R anterior knee diffusely TTP, unable to determine point tenderness. No tenderness to palpation in posterior knee. No erythema, edema or effusion. Flexion limited due to pain to 95 degrees, full extension. No laxity with varus or valgus stress. Able to perform drawer testing due to pain. Can walk 4+ steps.  Skin: warm, dry Neuro: grossly normal, moves all extremities.  Psych: Normal affect and thought content   Assessment/Plan:  Acute pain of right knee Started after fall 1 month ago, now acutely worsened with continued use. Patient able to bear weight/walk so will treat symptomatically with NSAIDs, ice and home exercises. Discussed with patient that if no improvement with conservative management that she should return for reevaluation at which time would consider xray.    Bufford Lope, DO PGY-2, [facility] 05/23/2017 9:58 AM

## 2017-07-08 DIAGNOSIS — Z86711 Personal history of pulmonary embolism: Secondary | ICD-10-CM | POA: Insufficient documentation

## 2017-07-08 DIAGNOSIS — M329 Systemic lupus erythematosus, unspecified: Secondary | ICD-10-CM | POA: Insufficient documentation

## 2017-07-08 HISTORY — DX: Personal history of pulmonary embolism: Z86.711

## 2017-07-26 ENCOUNTER — Encounter: Payer: Self-pay | Admitting: Family Medicine

## 2017-07-26 ENCOUNTER — Ambulatory Visit (INDEPENDENT_AMBULATORY_CARE_PROVIDER_SITE_OTHER): Payer: Medicare Other | Admitting: Family Medicine

## 2017-07-26 ENCOUNTER — Other Ambulatory Visit: Payer: Self-pay

## 2017-07-26 VITALS — BP 116/78 | HR 75 | Temp 98.0°F | Wt 204.0 lb

## 2017-07-26 DIAGNOSIS — Z1231 Encounter for screening mammogram for malignant neoplasm of breast: Secondary | ICD-10-CM

## 2017-07-26 DIAGNOSIS — Z0001 Encounter for general adult medical examination with abnormal findings: Secondary | ICD-10-CM | POA: Diagnosis not present

## 2017-07-26 NOTE — Progress Notes (Signed)
Subjective:  Melissa Gaines is a 47 y.o. female who presents to the [facility] today for annual wellness.  HPI:  Saw rheumatology recently earlier this month. Had bloodwork done. Had renal ultrasound.  Still having lots of stress and anxiety but overall manging well. Thinks that this will improve with the Youth focus counseling that her children will get in-home  Obesity -Right now she eats quite a bit as her children do not always finish the food and she feels the need to eat all the remaining food to prevent waist which is causing some overeating - stopped buying junk food - buying more vegetables - starting to walk, limited by her chronic R knee pain.  Now walking about once a week sometimes she walks to the bank which takes her 2 hours.  But most the time she walks about 20 minutes at a time which is what her knee can tolerate  ROS: Per HPI, otherwise all systems reviewed and negative  PMH:  The following were reviewed and entered/updated in epic: Past Medical History:  Diagnosis Date  . Anemia   . Asthma   . Chronic pain   . Congenital hip dislocation   . GERD (gastroesophageal reflux disease)   . Kidney mass 2017  . Migraine   . Osteoporosis   . Sleep apnea   . Systemic lupus erythematosus ([place])    Patient Active Problem List   Diagnosis Date Noted  . Systemic lupus erythematosus ([place]) 07/08/2017  . Hx pulmonary embolism 07/08/2017  . Acute pain of right knee 05/23/2017  . Tension headache 02/07/2017  . Renal mass 01/26/2017  . 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/21/2016  . Vitamin D deficiency 12/16/2016  . Dizziness 12/14/2016  . Anemia   . Migraine    Past Surgical History:  Procedure Laterality Date  . CESAREAN SECTION  2005  . CESAREAN SECTION W/BTL  2010  . HIP SURGERY     in childhood for congenital hip dislocation  . [place] RESECTION  2014  . STAPEDECTOMY  11/01/2011   L postauricular stapedectomy with CO2 laser and insertion of 6 x  4.75 mm SMart Piston  . TOTAL HIP ARTHROPLASTY     05/2000 R, 11/2000 L    Family History  Problem Relation Age of Onset  . Asthma Mother   . Liver cancer Mother   . Heart Problems Mother        heart attack and heart failure  . Kidney Stones Mother   . Osteoporosis Mother   . Stroke Mother   . Depression Mother   . Anxiety disorder Mother   . Kidney Stones Father   . Hernia Father   . Depression Sister   . Anxiety disorder Sister     Medications- reviewed and updated Current Outpatient Medications  Medication Sig Dispense Refill  . aspirin EC 81 MG tablet Take 81 mg by mouth daily.    . folic acid (FOLVITE) 1 MG tablet Take 1 tablet by mouth daily.    . methotrexate (RHEUMATREX) 2.5 MG tablet Take 6 tablets by mouth once a week.    . diclofenac sodium (VOLTAREN) 1 % GEL Apply 2 g topically 4 (four) times daily. 100 g 0  . hydroxychloroquine (PLAQUENIL) 200 MG tablet Take 1 tablet (200 mg total) by mouth daily. 30 tablet 0  . ibuprofen (ADVIL,MOTRIN) 600 MG tablet Take 1 tablet (600 mg total) by mouth every 6 (six) hours as needed for mild pain or moderate pain. 30 tablet 0  .  topiramate (TOPAMAX) 100 MG tablet Take 1 tablet (100 mg total) by mouth daily. 30 tablet 2  . Vitamin D, Ergocalciferol, (DRISDOL) 50000 units CAPS capsule Take 1 capsule (50,000 Units total) by mouth every 7 (seven) days. 8 capsule 0   No current facility-administered medications for this visit.     Allergies-reviewed and updated Allergies  Allergen Reactions  . 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 Throat closes and short of breath.    Social History   Socioeconomic History  . Marital status: Single    Spouse name: Not on file  . Number of children: 2  . Years of education: some high school  . Highest education level: Not on file  Occupational History  . Occupation: disabled  Social Needs  . Financial resource strain: Not on file  . Food insecurity:    Worry: Not on file     Inability: Not on file  . Transportation needs:    Medical: Not on file    Non-medical: Not on file  Tobacco Use  . Smoking status: Never Smoker  . Smokeless tobacco: Never Used  Substance and Sexual Activity  . Alcohol use: No  . Drug use: No  . Sexual activity: Not on file  Lifestyle  . Physical activity:    Days per week: 1 day    Minutes per session: 20 min  . Stress: Not on file  Relationships  . Social connections:    Talks on phone: Not on file    Gets together: Not on file    Attends religious service: Not on file    Active member of club or organization: Not on file    Attends meetings of clubs or organizations: Not on file    Relationship status: Not on file  Other Topics Concern  . Not on file  Social History Narrative   Lives with her 2 children, dog, fish, hamsters.    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    The person she would like to make her medical decisions for her is Bobbye Morton, a foster brother.   She likes to spend time with her children and write poems.    Objective:  Physical Exam: BP 116/78   Pulse 75   Temp 98 F (36.7 C) (Oral)   Wt 204 lb (92.5 kg)   LMP 06/26/2017 (Approximate)   SpO2 96%   BMI 41.20 kg/m   Gen: NAD, resting comfortably CV: RRR with no murmurs appreciated Pulm: NWOB, CTAB with no crackles, wheezes, or rhonchi GI: Normal bowel sounds present. Soft, Nontender, Nondistended. MSK: no edema, cyanosis, or clubbing noted Skin: warm, dry Neuro: grossly normal, moves all extremities Psych: Normal affect and thought content  Assessment/Plan:  Severe obesity (BMI >= 40) (HCC) Patient counseled on healthy diet and exercise.  Specifically counseled on the plate method and portion control.  Given handouts today  Annual visit for general adult medical examination with abnormal findings -Mammogram ordered for health maintenance routine screening   Bufford Lope, DO PGY-2, [facility] 07/26/2017 8:36 AM

## 2017-07-26 NOTE — Assessment & Plan Note (Signed)
Patient counseled on healthy diet and exercise.  Specifically counseled on the plate method and portion control.  Given handouts today

## 2017-07-26 NOTE — Patient Instructions (Addendum)
Obtain twice the volume of vegetables as either protein or starchy foods for both lunch and dinner.   You may also read about the DASH diet at: [employer]  Health Maintenance, Female Adopting a healthy lifestyle and getting preventive care can go a long way to promote health and wellness. Talk with your health care provider about what schedule of regular examinations is right for you. This is a good chance for you to check in with your provider about disease prevention and staying healthy. In between checkups, there are plenty of things you can do on your own. Experts have done a lot of research about which lifestyle changes and preventive measures are most likely to keep you healthy. Ask your health care provider for more information. Weight and diet Eat a healthy diet  Be sure to include plenty of vegetables, fruits, low-fat dairy products, and lean protein.  Do not eat a lot of foods high in solid fats, added sugars, or salt.  Get regular exercise. This is one of the most important things you can do for your health. ? Most adults should exercise for at least 150 minutes each week. The exercise should increase your heart rate and make you sweat (moderate-intensity exercise). ? Most adults should also do strengthening exercises at least twice a week. This is in addition to the moderate-intensity exercise.  Maintain a healthy weight  Body mass index (BMI) is a measurement that can be used to identify possible weight problems. It estimates body fat based on height and weight. Your health care provider can help determine your BMI and help you achieve or maintain a healthy weight.  For females 33 years of age and older: ? A BMI below 18.5 is considered underweight. ? A BMI of 18.5 to 24.9 is normal. ? A BMI of 25 to 29.9 is considered overweight. ? A BMI of 30 and above is considered obese.  Watch levels of  cholesterol and blood lipids  You should start having your blood tested for lipids and cholesterol at 46 years of age, then have this test every 5 years.  You may need to have your cholesterol levels checked more often if: ? Your lipid or cholesterol levels are high. ? You are older than 46 years of age. ? You are at high risk for heart disease.  Cancer screening Lung Cancer  Lung cancer screening is recommended for adults 81-17 years old who are at high risk for lung cancer because of a history of smoking.  A yearly low-dose CT scan of the lungs is recommended for people who: ? Currently smoke. ? Have quit within the past 15 years. ? Have at least a 30-pack-year history of smoking. A pack year is smoking an average of one pack of cigarettes a day for 1 year.  Yearly screening should continue until it has been 15 years since you quit.  Yearly screening should stop if you develop a health problem that would prevent you from having lung cancer treatment.  Breast Cancer  Practice breast self-awareness. This means understanding how your breasts normally appear and feel.  It also means doing regular breast self-exams. Let your health care provider know about any changes, no matter how small.  If you are in your 20s or 30s, you should have a clinical breast exam (CBE) by a health care provider every 1-3 years as part of a regular health exam.  If you are 100 or older, have a CBE every year. Also consider having a  breast X-ray (mammogram) every year.  If you have a family history of breast cancer, talk to your health care provider about genetic screening.  If you are at high risk for breast cancer, talk to your health care provider about having an MRI and a mammogram every year.  Breast cancer gene (BRCA) assessment is recommended for women who have family members with BRCA-related cancers. BRCA-related cancers include: ? Breast. ? Ovarian. ? Tubal. ? Peritoneal cancers.  Results  of the assessment will determine the need for genetic counseling and BRCA1 and BRCA2 testing.  Cervical Cancer Your health care provider may recommend that you be screened regularly for cancer of the pelvic organs (ovaries, uterus, and vagina). This screening involves a pelvic examination, including checking for microscopic changes to the surface of your cervix (Pap test). You may be encouraged to have this screening done every 3 years, beginning at age 20.  For women ages 67-65, health care providers may recommend pelvic exams and Pap testing every 3 years, or they may recommend the Pap and pelvic exam, combined with testing for human papilloma virus (HPV), every 5 years. Some types of HPV increase your risk of cervical cancer. Testing for HPV may also be done on women of any age with unclear Pap test results.  Other health care providers may not recommend any screening for nonpregnant women who are considered low risk for pelvic cancer and who do not have symptoms. Ask your health care provider if a screening pelvic exam is right for you.  If you have had past treatment for cervical cancer or a condition that could lead to cancer, you need Pap tests and screening for cancer for at least 20 years after your treatment. If Pap tests have been discontinued, your risk factors (such as having a new sexual partner) need to be reassessed to determine if screening should resume. Some women have medical problems that increase the chance of getting cervical cancer. In these cases, your health care provider may recommend more frequent screening and Pap tests.  Colorectal Cancer  This type of cancer can be detected and often prevented.  Routine colorectal cancer screening usually begins at 46 years of age and continues through 46 years of age.  Your health care provider may recommend screening at an earlier age if you have risk factors for colon cancer.  Your health care provider may also recommend using  home test kits to check for hidden blood in the stool.  A small camera at the end of a tube can be used to examine your colon directly (sigmoidoscopy or colonoscopy). This is done to check for the earliest forms of colorectal cancer.  Routine screening usually begins at age 59.  Direct examination of the colon should be repeated every 5-10 years through 46 years of age. However, you may need to be screened more often if early forms of precancerous polyps or small growths are found.  Skin Cancer  Check your skin from head to toe regularly.  Tell your health care provider about any new moles or changes in moles, especially if there is a change in a mole's shape or color.  Also tell your health care provider if you have a mole that is larger than the size of a pencil eraser.  Always use sunscreen. Apply sunscreen liberally and repeatedly throughout the day.  Protect yourself by wearing long sleeves, pants, a wide-brimmed hat, and sunglasses whenever you are outside.  Heart disease, diabetes, and high blood pressure  High  blood pressure causes heart disease and increases the risk of stroke. High blood pressure is more likely to develop in: ? People who have blood pressure in the high end of the normal range (130-139/85-89 mm Hg). ? People who are overweight or obese. ? People who are African American.  If you are 2-73 years of age, have your blood pressure checked every 3-5 years. If you are 10 years of age or older, have your blood pressure checked every year. You should have your blood pressure measured twice-once when you are at a hospital or clinic, and once when you are not at a hospital or clinic. Record the average of the two measurements. To check your blood pressure when you are not at a hospital or clinic, you can use: ? An automated blood pressure machine at a pharmacy. ? A home blood pressure monitor.  If you are between 72 years and 69 years old, ask your health care provider  if you should take aspirin to prevent strokes.  Have regular diabetes screenings. This involves taking a blood sample to check your fasting blood sugar level. ? If you are at a normal weight and have a low risk for diabetes, have this test once every three years after 46 years of age. ? If you are overweight and have a high risk for diabetes, consider being tested at a younger age or more often. Preventing infection Hepatitis B  If you have a higher risk for hepatitis B, you should be screened for this virus. You are considered at high risk for hepatitis B if: ? You were born in a country where hepatitis B is common. Ask your health care provider which countries are considered high risk. ? Your parents were born in a high-risk country, and you have not been immunized against hepatitis B (hepatitis B vaccine). ? You have HIV or AIDS. ? You use needles to inject street drugs. ? You live with someone who has hepatitis B. ? You have had sex with someone who has hepatitis B. ? You get hemodialysis treatment. ? You take certain medicines for conditions, including cancer, organ transplantation, and autoimmune conditions.  Hepatitis C  Blood testing is recommended for: ? Everyone born from [birth year] through 1964. ? Anyone with known risk factors for hepatitis C.  Sexually transmitted infections (STIs)  You should be screened for sexually transmitted infections (STIs) including gonorrhea and chlamydia if: ? You are sexually active and are younger than 46 years of age. ? You are older than 46 years of age and your health care provider tells you that you are at risk for this type of infection. ? Your sexual activity has changed since you were last screened and you are at an increased risk for chlamydia or gonorrhea. Ask your health care provider if you are at risk.  If you do not have HIV, but are at risk, it may be recommended that you take a prescription medicine daily to prevent HIV infection. This  is called pre-exposure prophylaxis (PrEP). You are considered at risk if: ? You are sexually active and do not regularly use condoms or know the HIV status of your partner(s). ? You take drugs by injection. ? You are sexually active with a partner who has HIV.  Talk with your health care provider about whether you are at high risk of being infected with HIV. If you choose to begin PrEP, you should first be tested for HIV. You should then be tested every 3 months for  as long as you are taking PrEP. Pregnancy  If you are premenopausal and you may become pregnant, ask your health care provider about preconception counseling.  If you may become pregnant, take 400 to 800 micrograms (mcg) of folic acid every day.  If you want to prevent pregnancy, talk to your health care provider about birth control (contraception). Osteoporosis and menopause  Osteoporosis is a disease in which the bones lose minerals and strength with aging. This can result in serious bone fractures. Your risk for osteoporosis can be identified using a bone density scan.  If you are 9 years of age or older, or if you are at risk for osteoporosis and fractures, ask your health care provider if you should be screened.  Ask your health care provider whether you should take a calcium or vitamin D supplement to lower your risk for osteoporosis.  Menopause may have certain physical symptoms and risks.  Hormone replacement therapy may reduce some of these symptoms and risks. Talk to your health care provider about whether hormone replacement therapy is right for you. Follow these instructions at home:  Schedule regular health, dental, and eye exams.  Stay current with your immunizations.  Do not use any tobacco products including cigarettes, chewing tobacco, or electronic cigarettes.  If you are pregnant, do not drink alcohol.  If you are breastfeeding, limit how much and how often you drink alcohol.  Limit alcohol intake  to no more than 1 drink per day for nonpregnant women. One drink equals 12 ounces of beer, 5 ounces of wine, or 1 ounces of hard liquor.  Do not use street drugs.  Do not share needles.  Ask your health care provider for help if you need support or information about quitting drugs.  Tell your health care provider if you often feel depressed.  Tell your health care provider if you have ever been abused or do not feel safe at home. This information is not intended to replace advice given to you by your health care provider. Make sure you discuss any questions you have with your health care provider. Document Released: 09/07/2010 Document Revised: 07/31/2015 Document Reviewed: 11/26/2014 Elsevier Interactive Patient Education  [employer].

## 2017-08-03 DIAGNOSIS — R760 Raised antibody titer: Secondary | ICD-10-CM | POA: Insufficient documentation

## 2017-08-16 DIAGNOSIS — N2889 Other specified disorders of kidney and ureter: Secondary | ICD-10-CM | POA: Diagnosis not present

## 2017-08-29 DIAGNOSIS — R6889 Other general symptoms and signs: Secondary | ICD-10-CM | POA: Diagnosis not present

## 2017-08-31 ENCOUNTER — Ambulatory Visit
Admission: RE | Admit: 2017-08-31 | Discharge: 2017-08-31 | Disposition: A | Discharge status: Home / Self Care | Payer: Medicare HMO | Source: Ambulatory Visit | Attending: Family Medicine | Admitting: Family Medicine

## 2017-08-31 DIAGNOSIS — Z1231 Encounter for screening mammogram for malignant neoplasm of breast: Secondary | ICD-10-CM

## 2017-08-31 DIAGNOSIS — R6889 Other general symptoms and signs: Secondary | ICD-10-CM | POA: Diagnosis not present

## 2017-09-26 DIAGNOSIS — R6889 Other general symptoms and signs: Secondary | ICD-10-CM | POA: Diagnosis not present

## 2017-10-11 ENCOUNTER — Ambulatory Visit (HOSPITAL_COMMUNITY): Payer: Medicare Other | Admitting: Psychiatry

## 2017-10-12 DIAGNOSIS — M329 Systemic lupus erythematosus, unspecified: Secondary | ICD-10-CM | POA: Diagnosis not present

## 2017-10-12 DIAGNOSIS — R6889 Other general symptoms and signs: Secondary | ICD-10-CM | POA: Diagnosis not present

## 2017-10-12 DIAGNOSIS — Z79899 Other long term (current) drug therapy: Secondary | ICD-10-CM | POA: Diagnosis not present

## 2017-10-12 DIAGNOSIS — Z86711 Personal history of pulmonary embolism: Secondary | ICD-10-CM | POA: Diagnosis not present

## 2017-10-12 DIAGNOSIS — R760 Raised antibody titer: Secondary | ICD-10-CM | POA: Diagnosis not present

## 2017-10-18 ENCOUNTER — Encounter

## 2017-10-28 DIAGNOSIS — R6889 Other general symptoms and signs: Secondary | ICD-10-CM | POA: Diagnosis not present

## 2017-11-05 ENCOUNTER — Ambulatory Visit (HOSPITAL_COMMUNITY): Payer: Medicare HMO | Admitting: Psychiatry

## 2017-11-05 DIAGNOSIS — R6889 Other general symptoms and signs: Secondary | ICD-10-CM | POA: Diagnosis not present

## 2017-12-07 ENCOUNTER — Encounter: Payer: Self-pay | Admitting: Hematology and Oncology

## 2017-12-07 ENCOUNTER — Telehealth: Payer: Self-pay | Admitting: Hematology and Oncology

## 2017-12-07 NOTE — Telephone Encounter (Signed)
New referral received from Dr. Gerilyn Nestle for history of pe. Pt has been cld and scheduled to see Dr. Audelia Hives on 10/14 at 11am. Pt aware to arrive 30 minutes early. Letter mailed.

## 2017-12-10 ENCOUNTER — Ambulatory Visit (HOSPITAL_COMMUNITY): Payer: Medicare HMO | Admitting: Psychiatry

## 2017-12-17 ENCOUNTER — Other Ambulatory Visit: Payer: Self-pay | Admitting: Hematology and Oncology

## 2017-12-17 ENCOUNTER — Ambulatory Visit (HOSPITAL_COMMUNITY): Payer: Medicare HMO | Admitting: Psychiatry

## 2017-12-17 NOTE — Progress Notes (Signed)
[facility] Initial Consultation  Patient Name:  Melissa Gaines  DOB: [birth year]   Date of Service: December 19, 2017  Primary Care Provider Bufford Lope DO  [address] [place], [place] [address]   Referring Provider: Lenice Pressman MD  Consulting Physician: Henreitta Leber, MD Hematology/Oncology  Reason for Referral: In the setting of a previous peripartum pulmonary embolism while residing in [place], [place] in 2010 [facility]); with anticoagulation discontinued after 6 months without recurrence, she presents now to exclude an underlying hereditary/acquired thrombophilia.  History Present Illness: Melissa Gaines is a 47 year old Hispanic Jehovah's Witness, originally from [place], [place], currently residing [place] whose past medical history is significant for systemic lupus erythematosus identified initially in 2005; chronic depression; obstructive sleep apnea noncompliant with CPAP; bronchial asthma; gastroesophageal reflux disease; chronic tension headaches; sleep disorder; elevated BMI; osteoporosis; congenital hip dislocation, status post bilateral total hip arthroplasty; anemia of chronic disease; vitamin D deficiency on replacement; and previous gastric sleeve resection in 2014.  Her primary care provider is Dr. Jamelle Rushing, [place].  She is followed also by Dr. Lenice Pressman, rheumatology.  She is alone at this first visit.  In 2005 she presented with a butterfly rash and mouth sores and was treated initially with Plaquenil and prednisone.  Because they were affecting her eyes, she subsequently was switched to methotrexate and Plaquenil.  She continues on methotrexate to the present. On Jul 08, 2017: A complete blood count showed hemoglobin 11.9 hematocrit 36.8 MCV 38.7 MCH 25.5 RDW 15.7 WBC 6.7 with 63% neutrophils 30% lymphocytes 4% monocytes 2% eosinophils 1%  basophil; platelets 302,000.  Her hepatitis C antibody was nonreactive.  Her hepatitis B surface antigen was nonreactive.  Her ANA titer on May 3 was 1: 320, speckled pattern.  On Jul 22, 2017 a lupus anticoagulant was identified.  The antiphospholipid antibody IgG 46.1 (<15); antiphospholipid antibody IgM 13.4 (<15); antiphospholipid antibody IgA 21 (<15); beta-2 glycoprotein 1 antibody IgM 19.4 (<15); beta-2 glycoprotein antibody IgG G 50.2 (<15).  On Jul 22, 2017 serum iron 31 TIBC 450 iron saturation 7% ferritin 9.  Complement C3 143; complement C4 21 it appears also at the time that she had a urinary tract infection.  The studies were performed at [facility].  She has a prior history of gestational diabetes mellitus.  She has no hypertension, coronary artery disease, or cardiac dysrhythmia.  She has on occasion subjective palpitations.  There is no prior seizure disorder or stroke syndrome.  She has decreased hearing in the left ear and underwent a cochlear implant while residing in [place].  She has no thyroid disease. She had a gastric sleeve resection in 2014.  She denies peptic ulcer or gastroesophageal reflux disease.  She has no viral hepatitis, inflammatory bowel disease, or symptomatic diverticulosis.  She has no rheumatoid or gouty arthritis.  Although she has osteoporosis, by report, she is not on a bisphosphonate.  Her energy level is very low.  She is single, never married.  She has 2 children without major medical problems.  She has monthly missed menstruation unchanged over her usual baseline.  She continues on methotrexate and folic acid for systemic lupus erythematosus..  Both her appetite and weight remain stable.  She has episodic frontal tension headaches without dizziness, lightheadedness, syncope, or near syncopal episodes.  She reports no rash or itching.  She has no new visual changes; she had a cochlear  implant on the left with diminished hearing.  There is  no pain or difficulty in swallowing.  She has no unusual cough, sore throat, or orthopnea.  No fever, shaking chills, sweats, or flulike symptoms are reported.  She has occasional episodes of reflux/heartburn but takes no medication.  There is no nausea, vomiting, diarrhea, constipation.  She denies melena or bright red blood per rectum.  She has never had a screening colonoscopy.No urinary frequency, urgency, hematuria, dysuria reported.    She has swelling of her ankles which does not reseed overnight. She has no bleeding tendency or easy bruisability.  She has degenerative joint disease involving the hands, fingers, hips, lower back, with "electric shock like pain" radiating from her lower back to her feet (right > left).  She has chronic paresthesias involving the feet.  It is with this background she presents now for further diagnostic and therapeutic recommendations to exclude a hereditary/acquired thrombophilia in the setting of a previous peripartum pulmonary embolism presumably without lower extremity deep vein thrombosis in 2010, currently not on anticoagulation therapy as described above.  Past Medical History:  Diagnosis Date  . Anemia   . Asthma   . Chronic pain   . Congenital hip dislocation   . GERD (gastroesophageal reflux disease)   . Kidney mass 2017  . Migraine   . Osteoporosis   . Sleep apnea   . Systemic lupus erythematosus ([place])    Past Surgical History:  Procedure Laterality Date  . CESAREAN SECTION  2005  . CESAREAN SECTION W/BTL  2010  . HIP SURGERY     in childhood for congenital hip dislocation  . [place] RESECTION  2014  . STAPEDECTOMY  11/01/2011   L postauricular stapedectomy with CO2 laser and insertion of 6 x 4.75 mm SMart Piston  . TOTAL HIP ARTHROPLASTY     05/2000 R, 11/2000 L   Gynecologic History: Her menarche was at age 70 years. Her last menses was in September. She is a gravida 2 para 2. Her first pregnancy was at age 21  years. Her last pregnancy was at age 63 years. Her menses last 7 days with 4 heavy days. Her menstruation is fairly regular on a 28-30-day cycle. In her teens she was on oral contraception for 1-1/2 years. Her most mammogram was in June, 2019. Her last Pap smear was in 2018. She has no prior breast biopsy.  Family History  Problem Relation Age of Onset  . Asthma Mother   . Liver cancer Mother   . Heart Problems Mother        heart attack and heart failure  . Kidney Stones Mother   . Osteoporosis Mother   . Stroke Mother   . Depression Mother   . Anxiety disorder Mother   . Kidney Stones Father   . Hernia Father   . Depression Sister   . Anxiety disorder Sister   Mother: Deceased: Age 82 years: SLE/CAD/liver cancer Father: Age 77 years: Kidney problems She has no brothers. She has 1 sister age 61 years with bronchial asthma/CAD.  Social History   Socioeconomic History  . Marital status: Single    Spouse name: Not on file  . Number of children: 2  . Years of education: some high school  . Highest education level: Not on file  Occupational History  . Occupation: disabled  Social Needs  . Financial resource strain: Not on file  . Food insecurity:    Worry: Not on file  Inability: Not on file  . Transportation needs:    Medical: Not on file    Non-medical: Not on file  Tobacco Use  . Smoking status: Never Smoker  . Smokeless tobacco: Never Used  Substance and Sexual Activity  . Alcohol use: No  . Drug use: No  . Sexual activity: Not on file  Lifestyle  . Physical activity:    Days per week: 1 day    Minutes per session: 20 min  . Stress: Not on file  Relationships  . Social connections:    Talks on phone: Not on file    Gets together: Not on file    Attends religious service: Not on file    Active member of club or organization: Not on file    Attends meetings of clubs or organizations: Not on file    Relationship status: Not on file  . Intimate partner  violence:    Fear of current or ex partner: Not on file    Emotionally abused: Not on file    Physically abused: Not on file    Forced sexual activity: Not on file  Other Topics Concern  . Not on file  Social History Narrative   Lives with her 2 children, dog, fish, hamsters.    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    The person she would like to make her medical decisions for her is Bobbye Morton, a foster brother.   She likes to spend time with her children and write poems.  Melissa Gaines is single, never married. She is a lifetime non-smoker. In her teens between ages 55 and 60 years, she drank fairly heavily. She does not consume any alcoholic beverages now. She reports no recreational drug use.  Transfusion History: She was transfused following her surgery 4 years ago.  Exposure History: She has no known exposure to toxic chemicals, radiation, or pesticides.  Allergies  Allergen Reactions  . 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 Throat closes and short of breath.  She has no medical allergies. She has nonspecific seasonal allergies.   Current Outpatient Medications on File Prior to Visit  Medication Sig  . aspirin EC 81 MG tablet Take 81 mg by mouth daily.  . folic acid (FOLVITE) 1 MG tablet Take 1 tablet by mouth daily.  . hydroxychloroquine (PLAQUENIL) 200 MG tablet Take 1 tablet (200 mg total) by mouth daily.  [name] Kitchen ibuprofen (ADVIL,MOTRIN) 600 MG tablet Take 1 tablet (600 mg total) by mouth every 6 (six) hours as needed for mild pain or moderate pain.  . methotrexate (RHEUMATREX) 2.5 MG tablet Take 6 tablets by mouth once a week.  . Vitamin D, Ergocalciferol, (DRISDOL) 50000 units CAPS capsule Take 1 capsule (50,000 Units total) by mouth every 7 (seven) days.  . diclofenac sodium (VOLTAREN) 1 % GEL Apply 2 g topically 4 (four) times daily. (Patient not taking: Reported on 12/19/2017)  . topiramate (TOPAMAX) 100 MG tablet Take 1 tablet  (100 mg total) by mouth daily. (Patient not taking: Reported on 12/19/2017)   No current facility-administered medications on file prior to visit.     Review of Systems: Constitutional: No fever, sweats, or shaking chills.  No appetite or weight deficit.  Elevated BMI. Skin: No rash, scaling, sores, lumps, or jaundice. HEENT: No visual changes; left cochlear implant and hearing deficit. Pulmonary: No unusual cough, sore throat, or orthopnea; DOE; bronchial asthma. Breasts: No complaints. Cardiovascular: No coronary artery disease,  angina, or myocardial infarction.  No cardiac dysrhythmia, essential hypertension, or dyslipidemia. Gastrointestinal: No indigestion, dysphagia, abdominal pain, diarrhea, or constipation.  No change in bowel habits; GERD.  No nausea or vomiting.  No melena or bright red blood per rectum.  Gastric sleeve resection, 2014. Genitourinary: No urinary frequency, urgency, hematuria, or dysuria. Musculoskeletal: Congenital dislocated hips; status post arthroplasty.  Degenerative joint disease involving the fingers, hands, hips, lower back, with extension into the lower extremities, right > left.  No new arthralgias or myalgias; no joint swelling, pain, or instability.  Right lower extremity pain without swelling SLE. Hematologic: No bleeding tendency or easy bruisability. Endocrine: No intolerance to heat or cold; no thyroid disease or diabetes mellitus. Vascular: No peripheral arterial or venous thromboembolic disease. Psychological: Chronic depression without suicidal ideation.  No anxiety disorder or panic attacks; or mood changes. Neurological: Chronic tension headaches 3 times weekly.  Topamax is ineffective.  Disequilibrium and imbalance due to back and hip pain.  No dizziness, lightheadedness, syncope, or near syncopal episodes; paresthesias involving the lower extremities; bilateral congenital hip dislocation.  Physical Examination: Vital Signs: Body surface area is  1.93 meters squared.  Vitals:   12/19/17 1216  BP: 123/79  Pulse: 69  Resp: 20  Temp: 97.9 F (36.6 C)  SpO2: 99%    Filed Weights   12/19/17 1216  Weight: 198 lb 3.2 oz (89.9 kg)  ECOG PERFORMANCE STATUS: 1 Constitutional:  Melissa Gaines is fully nourished and developed albeit overweight.  She looks age appropriate.  She is friendly and cooperative without respiratory compromise at rest. Skin: No rashes, scaling, dryness, jaundice, or itching. HEENT: Head is normocephalic and atraumatic.  Pupils are equal round and reactive to light and accommodation.  Sclerae are anicteric.  Conjunctivae are pink.  No sinus tenderness nor oropharyngeal lesions.  Lips without cracking or peeling; tongue without mass, inflammation, or nodularity.  Mucous membranes are moist. Neck: Supple and symmetric.  No jugular venous distention or thyromegaly.  Trachea is midline. Lymphatics: No cervical or supraclavicular lymphadenopathy.  No epitrochlear, axillary, or inguinal lymphadenopathy is appreciated. Respiratory/chest: Thorax is symmetrical.  Breath sounds are clear to auscultation and percussion.  Normal excursion and respiratory effort. Back: Symmetric without deformity or tenderness. Cardiovascular: Heart rate and rhythm are regular without murmurs. Gastrointestinal: Abdomen is soft, nontender; no organomegaly.  Bowel sounds are normoactive.  No masses are appreciated. Rectal examination: Not performed. Extremities: In the lower extremities, there is no asymmetric swelling, erythema, or cord formation.  She has pain in the right lower extremity on palpation.  No clubbing, cyanosis, nor edema. Hematologic: No petechiae, hematomas, or ecchymoses. Psychological:  She is oriented to person, place, and time; normal affect, memory, and cognition. Neurological: There are no gross neurologic deficits.  Laboratory Results: I have reviewed the data as listed: December 19, 2017  Ref Range & Units 13:42   WBC Count 4.0 - 10.5 K/uL 7.1   RBC 3.87 - 5.11 MIL/uL 5.36High    Hemoglobin 12.0 - 15.0 g/dL 12.2   HCT 36.0 - 46.0 % 39.4   MCV 80.0 - 100.0 fL 73.5Low    MCH 26.0 - 34.0 pg 22.8Low    MCHC 30.0 - 36.0 g/dL 31.0   RDW 11.5 - 15.5 % 19.9High    Platelet Count 150 - 400 K/uL 312   nRBC 0.0 - 0.2 % 0.0   Neutrophils Relative % % 53   Neutro Abs 1.7 - 7.7 K/uL 3.8   Lymphocytes Relative % 40  Lymphs Abs 0.7 - 4.0 K/uL 2.8   Monocytes Relative % 4   Monocytes Absolute 0.1 - 1.0 K/uL 0.3   Eosinophils Relative % 2   Eosinophils Absolute 0.0 - 0.5 K/uL 0.1   Basophils Relative % 0   Basophils Absolute 0.0 - 0.1 K/uL 0.0   Immature Granulocytes % 1   Abs Immature Granulocytes 0.00 - 0.07 K/uL 0.04     Ref Range & Units 13:42 34yr ago  Sodium 135 - 145 mmol/L 140  141 R  Potassium 3.5 - 5.1 mmol/L 3.8  4.4 R  Chloride 98 - 111 mmol/L 104  100 R  CO2 22 - 32 mmol/L 24  24 R  Glucose, Bld 70 - 99 mg/dL 77  79 R  BUN 6 - 20 mg/dL 5Low   6 R  Creatinine, Ser 0.44 - 1.00 mg/dL 0.63  0.46Low  R  Calcium 8.9 - 10.3 mg/dL 9.6  8.8 R  Total Protein 6.5 - 8.1 g/dL 8.7High     Albumin 3.5 - 5.0 g/dL 4.1    AST 15 - 41 U/L 17    ALT 0 - 44 U/L 17    Alkaline Phosphatase 38 - 126 U/L 81    Total Bilirubin 0.3 - 1.2 mg/dL 0.3    GFR calc non Af Amer >60 mL/min >60  121 R  GFR calc Af Amer >60 mL/min >60  139 R   Diagnostic/Imaging Studies: August 11, 2017 DIGITAL SCREENING BILATERAL MAMMOGRAM WITH CAD  COMPARISON:  None.  ACR Breast Density Category a: The breast tissue is almost entirely fatty.  FINDINGS: There are no findings suspicious for malignancy. Images were processed with CAD.  IMPRESSION: No mammographic evidence of malignancy. A result letter of this screening mammogram will be mailed directly to the patient.  RECOMMENDATION: Screening mammogram in one year. (Code:SM-B-01Y)  BI-RADS CATEGORY  1: Negative.  Trude Mcburney M.D. 08/31/2017  14:57  Summary/Assessment: In the setting of a previous peripartum pulmonary embolism while residing in [place], [place] in 2010 [facility]); with anticoagulation discontinued after 6 months without recurrence, she presents now to exclude an underlying hereditary/acquired thrombophilia.  In 2005, she presented with a butterfly rash and mouth sores and was treated initially with hydroxychloroquine and prednisone.  Because the prednisone was affecting her eyesight, she subsequently was switched to methotrexate and Plaquenil.  She continues on methotrexate and hydroxychloroquine to the present.   On Jul 08, 2017: A complete blood count showed hemoglobin 11.9 hematocrit 36.8 MCV 38.7 MCH 25.5 RDW 15.7 WBC 6.7 with 63% neutrophils 30% lymphocytes 4% monocytes 2% eosinophils 1% basophil; platelets 302,000.  Her hepatitis C antibody was nonreactive.  Her hepatitis B surface antigen was nonreactive.  Her ANA titer on May 3 was 1: 320, speckled pattern.  On Jul 22, 2017 a lupus anticoagulant was identified.  The antiphospholipid antibody IgG 46.1 (<15); antiphospholipid antibody IgM 13.4 (<15); antiphospholipid antibody IgA 21 (<15); beta-2 glycoprotein 1 antibody IgM 19.4 (<15); beta-2 glycoprotein antibody IgG G 50.2 (<15).  On Jul 22, 2017 serum iron 31 TIBC 450 iron saturation 7% ferritin 9.  Complement C3 143; complement C4 21.  It appears also at the time that she had a urinary tract infection.  The studies were performed at [facility].  She has a prior history of gestational diabetes mellitus.  She has no hypertension, coronary artery disease, or cardiac dysrhythmia.  She has on occasion subjective palpitations.  There is no  prior seizure disorder or stroke syndrome.  She has decreased hearing in the left ear and underwent a cochlear implant while residing in [place].  She has no thyroid disease. She had a gastric sleeve resection in 2014.  She takes no  vitamin supplementation.  She denies peptic ulcer or gastroesophageal reflux disease.  She has no viral hepatitis, inflammatory bowel disease, or symptomatic diverticulosis.  She has no rheumatoid or gouty arthritis.  Although she has osteoporosis, by report, she is not on a bisphosphonate.  Her energy level is very low.    Both her appetite and weight remain stable.  She has episodic frontal tension headaches without dizziness, lightheadedness, syncope, or near syncopal episodes.  She reports no rash or itching.  She has no new visual changes; she had a cochlear implant on the left with diminished hearing.  There is no pain or difficulty in swallowing.  She has no unusual cough, sore throat, or orthopnea.  No fever, shaking chills, sweats, or flulike symptoms are reported.  She has occasional episodes of reflux/heartburn but takes no medication.  There is no nausea, vomiting, diarrhea, constipation.  She denies melena or bright red blood per rectum.  She has never had a screening colonoscopy.No urinary frequency, urgency, hematuria, dysuria reported.  She has swelling of her ankles which does not reseed overnight. She has no bleeding tendency or easy bruisability.  She has degenerative joint disease involving the hands, fingers, hips, lower back, with "electric shock like pain" radiating from her lower back to her feet (right > left).  She has chronic paresthesias involving the feet.   Her other comorbid problems include systemic lupus erythematosus identified initially in 2005, on methotrexate and hydroxychloroquine; chronic depression; obstructive sleep apnea noncompliant with CPAP; bronchial asthma; gastroesophageal reflux disease; chronic tension headaches; sleep disorder; elevated BMI; osteoporosis; degenerative joint disease; chronic pain syndrome; congenital hip dislocation, status post bilateral total hip arthroplasty; anemia of chronic disease; vitamin D deficiency on replacement; and previous gastric  sleeve resection in 2014.    Recommendation/Plan: We discussed in detail her prior history as well as the results of her laboratory studies including a previous hypercoagulable evaluation discussed above.  Because of her prior history with a pulmonary embolism in the peripartum period, a hypercoagulable evaluation was requested to assess and/or identify her risk for subsequent pulmonary embolism/DVT.  If the antiphospholipid antibody is still abnormal, I would consider some form of anticoagulation given her risk.  By report, a duplex venous doppler was performed of her right lower extremity which was reportedly negative.  Those records are not available on the existing chart.  If it was not performed, I recommend a right lower extremity duplex venous doppler to exclude a thrombus.  She reports no previous deep vein thrombosis at the time of her initial pulmonary embolism.  Those records however are not available on the existing chart.  Because of her anemia, laboratory studies were obtained to exclude an underlying nutritional deficiency, hemolytic process, iron deficit, or metabolic anomaly.  Those results are pending.  Her next scheduled doctor visit to discuss those results is on January 02, 2018.  She was advised to call us in the interim should any new or untoward problems arise.   The total time spent discussing her prior pulmonary embolism history, laboratory studies done prior to today's visit, and the importance of identifying a predisposing hereditary/acquired tendency toward venous thromboembolic disease or arterial vascular disease was 60 minutes.  At least 50% of that time was  spent in discussion, reviewing outside records, laboratory evaluation, counseling, and answering questions.   This note was dictated using voice activated technology/software.  Unfortunately, typographical errors are not uncommon, and transcription is subject to mistakes and regrettably misinterpretation.  If  necessary, clarification of the above information can be discussed with me at any time.  Thank you Drs. Shawna Orleans and Ziokowska for allowing my participation in the care of Melissa Gaines. I will keep you closely informed as the results of her preliminary laboratory data become available.  Please do not hesitate to call should any questions arise regarding this initial consultation and discussion.  FOLLOW UP: AS DIRECTED   cc:      Bufford Lope DO                 Lenice Pressman MD   Henreitta Leber, MD  Hematology/Oncology [place] [address] [place], [place] [address] Office: [phone] KRCV: [phone]

## 2017-12-19 ENCOUNTER — Inpatient Hospital Stay: Payer: Medicare HMO

## 2017-12-19 ENCOUNTER — Other Ambulatory Visit: Payer: Self-pay | Admitting: Hematology and Oncology

## 2017-12-19 ENCOUNTER — Inpatient Hospital Stay: Payer: Medicare HMO | Attending: Hematology and Oncology | Admitting: Hematology and Oncology

## 2017-12-19 ENCOUNTER — Encounter: Payer: Self-pay | Admitting: Hematology and Oncology

## 2017-12-19 ENCOUNTER — Other Ambulatory Visit: Payer: Self-pay

## 2017-12-19 ENCOUNTER — Telehealth: Payer: Self-pay

## 2017-12-19 VITALS — BP 123/79 | HR 69 | Temp 97.9°F | Resp 20 | Ht 59.0 in | Wt 198.2 lb

## 2017-12-19 DIAGNOSIS — Z7982 Long term (current) use of aspirin: Secondary | ICD-10-CM | POA: Diagnosis not present

## 2017-12-19 DIAGNOSIS — Z86711 Personal history of pulmonary embolism: Secondary | ICD-10-CM | POA: Diagnosis not present

## 2017-12-19 DIAGNOSIS — K219 Gastro-esophageal reflux disease without esophagitis: Secondary | ICD-10-CM | POA: Diagnosis not present

## 2017-12-19 DIAGNOSIS — D649 Anemia, unspecified: Secondary | ICD-10-CM

## 2017-12-19 DIAGNOSIS — Z8249 Family history of ischemic heart disease and other diseases of the circulatory system: Secondary | ICD-10-CM | POA: Diagnosis not present

## 2017-12-19 DIAGNOSIS — D539 Nutritional anemia, unspecified: Secondary | ICD-10-CM

## 2017-12-19 DIAGNOSIS — O88019 Air embolism in pregnancy, unspecified trimester: Secondary | ICD-10-CM

## 2017-12-19 DIAGNOSIS — Z9884 Bariatric surgery status: Secondary | ICD-10-CM

## 2017-12-19 DIAGNOSIS — G4730 Sleep apnea, unspecified: Secondary | ICD-10-CM | POA: Diagnosis not present

## 2017-12-19 DIAGNOSIS — M329 Systemic lupus erythematosus, unspecified: Secondary | ICD-10-CM

## 2017-12-19 DIAGNOSIS — E559 Vitamin D deficiency, unspecified: Secondary | ICD-10-CM

## 2017-12-19 DIAGNOSIS — F329 Major depressive disorder, single episode, unspecified: Secondary | ICD-10-CM | POA: Diagnosis not present

## 2017-12-19 DIAGNOSIS — R002 Palpitations: Secondary | ICD-10-CM | POA: Diagnosis not present

## 2017-12-19 DIAGNOSIS — Z79899 Other long term (current) drug therapy: Secondary | ICD-10-CM | POA: Diagnosis not present

## 2017-12-19 LAB — CBC WITH DIFFERENTIAL (CANCER CENTER ONLY)
ABS IMMATURE GRANULOCYTES: 0.04 10*3/uL (ref 0.00–0.07)
BASOS ABS: 0 10*3/uL (ref 0.0–0.1)
Basophils Relative: 0 %
Eosinophils Absolute: 0.1 10*3/uL (ref 0.0–0.5)
Eosinophils Relative: 2 %
HCT: 39.4 % (ref 36.0–46.0)
HEMOGLOBIN: 12.2 g/dL (ref 12.0–15.0)
IMMATURE GRANULOCYTES: 1 %
LYMPHS ABS: 2.8 10*3/uL (ref 0.7–4.0)
Lymphocytes Relative: 40 %
MCH: 22.8 pg — ABNORMAL LOW (ref 26.0–34.0)
MCHC: 31 g/dL (ref 30.0–36.0)
MCV: 73.5 fL — ABNORMAL LOW (ref 80.0–100.0)
MONOS PCT: 4 %
Monocytes Absolute: 0.3 10*3/uL (ref 0.1–1.0)
Neutro Abs: 3.8 10*3/uL (ref 1.7–7.7)
Neutrophils Relative %: 53 %
Platelet Count: 312 10*3/uL (ref 150–400)
RBC: 5.36 MIL/uL — AB (ref 3.87–5.11)
RDW: 19.9 % — ABNORMAL HIGH (ref 11.5–15.5)
WBC Count: 7.1 10*3/uL (ref 4.0–10.5)
nRBC: 0 % (ref 0.0–0.2)

## 2017-12-19 LAB — RETICULOCYTES
Immature Retic Fract: 17.6 % — ABNORMAL HIGH (ref 2.3–15.9)
RBC.: 5.36 MIL/uL — ABNORMAL HIGH (ref 3.87–5.11)
RETIC CT PCT: 1.4 % (ref 0.4–3.1)
Retic Count, Absolute: 75 10*3/uL (ref 19.0–186.0)

## 2017-12-19 LAB — COMPREHENSIVE METABOLIC PANEL
ALBUMIN: 4.1 g/dL (ref 3.5–5.0)
ALK PHOS: 81 U/L (ref 38–126)
ALT: 17 U/L (ref 0–44)
ANION GAP: 12 (ref 5–15)
AST: 17 U/L (ref 15–41)
BILIRUBIN TOTAL: 0.3 mg/dL (ref 0.3–1.2)
BUN: 5 mg/dL — AB (ref 6–20)
CALCIUM: 9.6 mg/dL (ref 8.9–10.3)
CO2: 24 mmol/L (ref 22–32)
CREATININE: 0.63 mg/dL (ref 0.44–1.00)
Chloride: 104 mmol/L (ref 98–111)
GFR calc Af Amer: >60 mL/min (ref >60)
GFR calc non Af Amer: >60 mL/min (ref >60)
GLUCOSE: 77 mg/dL (ref 70–99)
Potassium: 3.8 mmol/L (ref 3.5–5.1)
Sodium: 140 mmol/L (ref 135–145)
TOTAL PROTEIN: 8.7 g/dL — AB (ref 6.5–8.1)

## 2017-12-19 LAB — VITAMIN B12: Vitamin B-12: 226 pg/mL (ref 180–914)

## 2017-12-19 LAB — ANTITHROMBIN III: ANTITHROMB III FUNC: 109 % (ref 75–120)

## 2017-12-19 LAB — FOLATE: FOLATE: 13.6 ng/mL (ref >5.9)

## 2017-12-19 NOTE — Patient Instructions (Signed)
We discussed in detail your prior history with blood clots during pregnancy and your most recent laboratory studies.  Because of your prior pulmonary embolism during pregnancy, a hypercoagulable evaluation was requested to identify your risk for subsequent pulmonary embolism/DVT.  In addition, because of your anemia, laboratory studies are obtained to exclude an underlying cause for your anemia.  Barring any unforeseen complications, your next scheduled doctor visit to discuss those results he is on January 02, 2018.  Please do not hesitate to call in the interim should any new or untoward problems arise.  Thank you! Ladona Ridgel, MD Hematology/Oncology [phone]

## 2017-12-19 NOTE — Telephone Encounter (Signed)
Printed avs and calender of upcoming appointment. Per 10/14 los 

## 2017-12-20 ENCOUNTER — Telehealth: Payer: Self-pay | Admitting: Emergency Medicine

## 2017-12-20 LAB — HEMOGLOBINOPATHY EVALUATION
HGB F QUANT: 0 % (ref 0.0–2.0)
HGB S QUANTITAION: 0 %
Hgb A2 Quant: 1.9 % (ref 1.8–3.2)
Hgb A: 98.1 % (ref 96.4–98.8)
Hgb C: 0 %
Hgb Variant: 0 %

## 2017-12-20 LAB — IRON AND TIBC
Iron: 19 ug/dL — ABNORMAL LOW (ref 41–142)
Saturation Ratios: 4 % — ABNORMAL LOW (ref 21–57)
TIBC: 478 ug/dL — AB (ref 236–444)
UIBC: 459 ug/dL

## 2017-12-20 LAB — FACTOR 8 ASSAY: COAGULATION FACTOR VIII: 223 % — AB (ref 56–140)

## 2017-12-20 LAB — PROTEIN C ACTIVITY: Protein C Activity: 181 % — ABNORMAL HIGH (ref 73–180)

## 2017-12-20 LAB — HOMOCYSTEINE: HOMOCYSTEINE-NORM: 9.9 umol/L (ref 0.0–15.0)

## 2017-12-20 LAB — HAPTOGLOBIN: Haptoglobin: 305 mg/dL — ABNORMAL HIGH (ref 34–200)

## 2017-12-20 LAB — PROTEIN S ACTIVITY: PROTEIN S ACTIVITY: 81 % (ref 63–140)

## 2017-12-20 LAB — FERRITIN: Ferritin: 6 ng/mL — ABNORMAL LOW (ref 11–307)

## 2017-12-20 NOTE — Telephone Encounter (Signed)
Faxed visit info for MD Ruben visit from 12/19/17 to MD Ziolkowska.   Fax received

## 2017-12-21 LAB — CARDIOLIPIN ANTIBODIES, IGG, IGM, IGA
ANTICARDIOLIPIN IGA: 21 U/mL — AB (ref 0–11)
ANTICARDIOLIPIN IGG: 62 GPL U/mL — AB (ref 0–14)
ANTICARDIOLIPIN IGM: 28 [MPL'U]/mL — AB (ref 0–12)

## 2017-12-21 LAB — BETA-2-GLYCOPROTEIN I ABS, IGG/M/A
Beta-2 Glyco I IgG: 27 GPI IgG units — ABNORMAL HIGH (ref 0–20)
Beta-2-Glycoprotein I IgA: 17 GPI IgA units (ref 0–25)
Beta-2-Glycoprotein I IgM: 16 GPI IgM units (ref 0–32)

## 2017-12-23 LAB — METHYLMALONIC ACID, SERUM: Methylmalonic Acid, Quantitative: 92 nmol/L (ref 0–378)

## 2017-12-26 LAB — FACTOR 5 LEIDEN

## 2017-12-26 LAB — PROTHROMBIN GENE MUTATION

## 2018-01-01 ENCOUNTER — Other Ambulatory Visit: Payer: Self-pay | Admitting: Hematology and Oncology

## 2018-01-01 DIAGNOSIS — O88019 Air embolism in pregnancy, unspecified trimester: Secondary | ICD-10-CM

## 2018-01-03 ENCOUNTER — Inpatient Hospital Stay: Payer: Medicare HMO

## 2018-01-03 ENCOUNTER — Inpatient Hospital Stay (HOSPITAL_BASED_OUTPATIENT_CLINIC_OR_DEPARTMENT_OTHER): Payer: Medicare HMO | Admitting: Hematology and Oncology

## 2018-01-03 ENCOUNTER — Encounter: Payer: Self-pay | Admitting: Hematology and Oncology

## 2018-01-03 ENCOUNTER — Telehealth: Payer: Self-pay | Admitting: Hematology and Oncology

## 2018-01-03 VITALS — BP 136/68 | HR 98 | Temp 98.4°F | Resp 17 | Ht 59.0 in | Wt 203.6 lb

## 2018-01-03 DIAGNOSIS — R002 Palpitations: Secondary | ICD-10-CM | POA: Diagnosis not present

## 2018-01-03 DIAGNOSIS — F329 Major depressive disorder, single episode, unspecified: Secondary | ICD-10-CM | POA: Diagnosis not present

## 2018-01-03 DIAGNOSIS — Z7982 Long term (current) use of aspirin: Secondary | ICD-10-CM

## 2018-01-03 DIAGNOSIS — G4730 Sleep apnea, unspecified: Secondary | ICD-10-CM

## 2018-01-03 DIAGNOSIS — Z86711 Personal history of pulmonary embolism: Secondary | ICD-10-CM

## 2018-01-03 DIAGNOSIS — Z8249 Family history of ischemic heart disease and other diseases of the circulatory system: Secondary | ICD-10-CM

## 2018-01-03 DIAGNOSIS — D649 Anemia, unspecified: Secondary | ICD-10-CM

## 2018-01-03 DIAGNOSIS — Z79899 Other long term (current) drug therapy: Secondary | ICD-10-CM

## 2018-01-03 DIAGNOSIS — M329 Systemic lupus erythematosus, unspecified: Secondary | ICD-10-CM

## 2018-01-03 DIAGNOSIS — O88019 Air embolism in pregnancy, unspecified trimester: Secondary | ICD-10-CM

## 2018-01-03 DIAGNOSIS — E559 Vitamin D deficiency, unspecified: Secondary | ICD-10-CM

## 2018-01-03 DIAGNOSIS — Z9884 Bariatric surgery status: Secondary | ICD-10-CM

## 2018-01-03 DIAGNOSIS — K219 Gastro-esophageal reflux disease without esophagitis: Secondary | ICD-10-CM | POA: Diagnosis not present

## 2018-01-03 LAB — PROTIME-INR
INR: 0.92
PROTHROMBIN TIME: 12.3 s (ref 11.4–15.2)

## 2018-01-03 LAB — D-DIMER, QUANTITATIVE: D-Dimer, Quant: 0.48 ug/mL-FEU (ref 0.00–0.50)

## 2018-01-03 LAB — APTT: aPTT: 38 seconds — ABNORMAL HIGH (ref 24–36)

## 2018-01-03 NOTE — Patient Instructions (Signed)
We discussed in detail the results of your prior hypercoagulable work-up and evaluation.  Your antiphospholipid antibody profile was abnormal.  Your factor VIII level was also elevated.  Copies of these tests were given for your review.  A repeat lupus anticoagulant was requested today.  Barring any unforeseen complications, your next scheduled doctor visit to discuss these results is on November 13.  Please do not hesitate to call if any questions arise in the interim.  Ladona Ridgel, MD Hematology/Oncology

## 2018-01-03 NOTE — Progress Notes (Signed)
[facility] Outpatient Progress Note  Patient Name:  Melissa Gaines  DOB: March 22, 1970   Date of Service: January 03, 2018  Primary Care Provider Bufford Lope DO  [address] [place], [place] [address]   Referring Provider: Lenice Pressman MD  Consulting Physician: Henreitta Leber, MD Hematology/Oncology  Reason for Visit: In the setting of a previous peripartum pulmonary embolism while residing in [place], [place] in 2010 [facility]); with anticoagulation discontinued after 6 months without recurrence, she presents now for the results of her preliminary laboratory evaluation and recommendations in the setting of 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  Brief History: Melissa Gaines is a 47 year old Hispanic Je[employer]'s [employer], originally from [place], [place], currently residing [place] whose past medical history is significant for systemic lupus erythematosus identified initially in 2005; chronic depression; obstructive sleep apnea noncompliant with CPAP; bronchial asthma; gastroesophageal reflux disease; chronic tension headaches; sleep disorder; elevated BMI; osteoporosis; congenital hip dislocation, status post bilateral total hip arthroplasty; anemia of chronic disease; vitamin D deficiency on replacement; and previous gastric sleeve resection in 2014.  Her primary care provider is Dr. Jamelle Rushing, [place].  She is followed also by Dr. Lenice Pressman, rheumatology.  She is alone at this visit.  In 2005 she presented with a butterfly rash and mouth sores and was treated initially with Plaquenil and prednisone.  Because they were affecting her eyes, she subsequently was switched to methotrexate and Plaquenil.  She continues on methotrexate to the present. On Jul 08, 2017: A complete blood count showed hemoglobin 11.9 hematocrit 36.8 MCV 38.7 MCH 25.5 RDW 15.7 WBC 6.7 with 63%  neutrophils 30% lymphocytes 4% monocytes 2% eosinophils 1% basophil; platelets 302,000.  Her hepatitis C antibody was nonreactive.  Her hepatitis B surface antigen was nonreactive.  Her ANA titer on May 3 was 1: 320, speckled pattern.  On Jul 22, 2017 a lupus anticoagulant was identified.  The antiphospholipid antibody IgG 46.1 (<15); antiphospholipid antibody IgM 13.4 (<15); antiphospholipid antibody IgA 21 (<15); beta-2 glycoprotein 1 antibody IgM 19.4 (<15); beta-2 glycoprotein antibody IgG G 50.2 (<15).  On Jul 22, 2017 serum iron 31 TIBC 450 iron saturation 7% ferritin 9.  Complement C3 143; complement C4 21 it appears also at the time that she had a urinary tract infection.  The studies were performed at [facility].  At the time of our initial visit, we discussed in detail her prior history as well as the results of her laboratory studies including a previous hypercoagulable evaluation. Because of  her prior pulmonary embolism was in the peripartum period, a hypercoagulable evaluation was requested to assess and/or identify her risk for subsequent pulmonary embolism/DVT.  If the antiphospholipid antibody is still abnormal, some form of anticoagulation would be considered given the risk.  By report, a duplex venous doppler was performed of her right lower extremity which was reportedly negative.  Those records are not available on the existing chart.  If it was not performed, I recommend a right lower extremity duplex venous doppler to exclude a thrombus. She reports no previous deep vein thrombosis at the time of her initial pulmonary embolism.  Those records however are not available on the existing chart.  She has decreased hearing in the left ear and underwent a cochlear implant while residing in [place]. She had a gastric sleeve resection in 2014. Although she has osteoporosis, by report, she is not on a bisphosphonate. Her energy level  is low. She is single, never married.  She has 2 healthy children. She has monthly missed menstruation unchanged over her usual baseline. She continues on methotrexate and folic acid for systemic lupus erythematosus.  Because of her anemia, laboratory studies were obtained to exclude an underlying nutritional deficiency, hemolytic process, iron deficit, or metabolic anomaly.  Those results are detailed below. It is with this background she presents now for further discussion regarding her previous laboratory evaluation from October 14 to exclude a hereditary/acquired thrombophilia in the setting of previous peripartum  pulmonary embolism presumably without lower extremity deep vein thrombosis in 2010, currently not on anticoagulation therapy as described above.  Interval History: In the interim since her last visit, both her appetite and weight remain stable.  She has episodic frontal tension headaches without dizziness, lightheadedness, syncope, or near syncopal episodes. She reports no rash or itching.  She has no new visual changes; she had a cochlear implant on the left with diminished hearing. There is no pain or difficulty in swallowing.  She has no unusual cough, sore throat, or orthopnea.  No fever, shaking chills, sweats, or flulike symptoms are reported.  She has occasional episodes of reflux/heartburn but takes no medication.  There is no nausea, vomiting, diarrhea, constipation.  She denies melena or bright red blood per rectum. She has never had a screening colonoscopy. No urinary frequency, urgency, hematuria, dysuria reported. She has swelling of her ankles which does not reseed overnight. She has no bleeding tendency or easy bruisability.  She has degenerative joint disease involving the hands, fingers, hips, lower back, with "electric shock like pain" radiating from her lower back to her feet (right > left).  She has chronic paresthesias involving the feet.    Past Medical History Reviewed        Family History Reviewed        Social History Reviewed  Past Medical History:  Diagnosis Date  . Anemia   . Asthma   . Chronic pain   . Congenital hip dislocation   . GERD (gastroesophageal reflux disease)   . Kidney mass 2017  . Migraine   . Osteoporosis   . Sleep apnea   . Systemic lupus erythematosus (HCC)    Allergies  Allergen Reactions  . Fish Allergy Anaphylaxis    To Gaines and tilapia. Throat closes and short of breath.  She has no medical allergies. She has nonspecific seasonal allergies.  Current Outpatient Medications on File Prior to Visit  Medication Sig  . diclofenac sodium (VOLTAREN) 1 % GEL Apply 2 g topically 4 (four) times daily. (Patient not taking: Reported on 12/19/2017)  . folic acid (FOLVITE) 1 MG tablet Take 1 tablet by mouth daily.  [name] Kitchen ibuprofen (ADVIL,MOTRIN) 600 MG tablet Take 1 tablet (600 mg total) by mouth every 6 (six) hours as needed for mild pain or moderate pain.  . methotrexate (RHEUMATREX) 2.5 MG tablet Take 6 tablets by mouth once a week.  . topiramate (TOPAMAX) 100 MG tablet Take 1 tablet (100 mg total) by mouth daily. (Patient not taking: Reported on 12/19/2017)  . Vitamin D, Ergocalciferol, (DRISDOL) 50000 units CAPS capsule Take 1 capsule (50,000 Units total) by mouth every 7 (seven) days.   No current facility-administered medications on file prior to visit.     Review of Systems: Constitutional: No fever, sweats, or shaking chills.  No appetite or weight deficit.  Elevated BMI. Skin: No rash, scaling, sores, lumps, or jaundice. HEENT: No visual changes; left cochlear implant and  hearing deficit. Pulmonary: No unusual cough, sore throat, or orthopnea; DOE; bronchial asthma. Breasts: No complaints. Cardiovascular: No coronary artery disease, angina, or myocardial infarction.  No cardiac dysrhythmia, essential hypertension, or dyslipidemia. Gastrointestinal: No indigestion, dysphagia, abdominal pain, diarrhea, or constipation.  No change in bowel habits; GERD.   No nausea or vomiting.  No melena or bright red blood per rectum.  Gastric sleeve resection, 2014. Genitourinary: No urinary frequency, urgency, hematuria, or dysuria. Musculoskeletal: Congenital dislocated hips; status post arthroplasty.  Degenerative joint disease involving the fingers, hands, hips, lower back, with extension into the lower extremities, right > left.  No new arthralgias or myalgias; no joint swelling, pain, or instability.  Right lower extremity pain without swelling SLE. Hematologic: No bleeding tendency or easy bruisability. Endocrine: No intolerance to heat or cold; no thyroid disease or diabetes mellitus. Vascular: No peripheral arterial or venous thromboembolic disease. Psychological: Chronic depression without suicidal ideation.  No anxiety disorder or panic attacks; or mood changes. Neurological: Chronic tension headaches 3 times weekly.  Topamax is ineffective.  Disequilibrium and imbalance due to back and hip pain.  No dizziness, lightheadedness, syncope, or near syncopal episodes; paresthesias involving the lower extremities; bilateral congenital hip dislocation.  Physical Examination: Vital Signs: Body surface area is 1.96 meters squared.  Vitals:   01/03/18 1101  BP: 136/68  Pulse: 98  Resp: 17  Temp: 98.4 F (36.9 C)  SpO2: 99%    Filed Weights   01/03/18 1101  Weight: 203 lb 9.6 oz (92.4 kg)  Body mass index is 41.12 kg/m. ECOG PERFORMANCE STATUS: 1 Constitutional:  Rhythm Gubbels is fully nourished and developed albeit overweight.  She looks age appropriate.  She is friendly and cooperative without respiratory compromise at rest. Skin: No rashes, scaling, dryness, jaundice, or itching. HEENT: Head is normocephalic and atraumatic.  Pupils are equal round and reactive to light and accommodation.  Sclerae are anicteric.  Conjunctivae are pink.  No sinus tenderness nor oropharyngeal lesions.  Lips without cracking or peeling; tongue without mass,  inflammation, or nodularity.  Mucous membranes are moist. Neck: Supple and symmetric.  No jugular venous distention or thyromegaly.  Trachea is midline. Lymphatics: No cervical or supraclavicular lymphadenopathy.  No epitrochlear, axillary, or inguinal lymphadenopathy is appreciated. Respiratory/chest: Thorax is symmetrical.  Breath sounds are clear to auscultation and percussion.  Normal excursion and respiratory effort. Back: Symmetric without deformity or tenderness. Cardiovascular: Heart rate and rhythm are regular without murmurs. Gastrointestinal: Abdomen is soft, nontender; no organomegaly.  Bowel sounds are normoactive.  No masses are appreciated. Extremities: In the lower extremities, there is no asymmetric swelling, erythema, or cord formation.  She has pain in the right lower extremity on palpation.  No clubbing, cyanosis, nor edema. Hematologic: No petechiae, hematomas, or ecchymoses. Psychological:  She is oriented to person, place, and time; normal affect, memory, and cognition. Neurological: There are no gross neurologic deficits.  Laboratory Results: December 19, 2017  Ref Range & Units 2wk ago 68yr ago  WBC Count 4.0 - 10.5 K/uL 7.1  6.4 R  RBC 3.87 - 5.11 MIL/uL 5.36High   5.42High  R  Hemoglobin 12.0 - 15.0 g/dL 12.2  13.5 R  HCT 36.0 - 46.0 % 39.4  42.6 R  MCV 80.0 - 100.0 fL 73.5Low   79 R  MCH 26.0 - 34.0 pg 22.8Low   24.9Low  R  MCHC 30.0 - 36.0 g/dL 31.0  31.7 R  RDW 11.5 - 15.5 % 19.9High   21.7High  R  Platelet Count 150 - 400 K/uL 312  253 R  nRBC 0.0 - 0.2 % 0.0    Neutrophils Relative % % 53  58 R  Neutro Abs 1.7 - 7.7 K/uL 3.8  3.7 R  Lymphocytes Relative % 40    Lymphs Abs 0.7 - 4.0 K/uL 2.8  2.2 R  Monocytes Relative % 4    Monocytes Absolute 0.1 - 1.0 K/uL 0.3    Eosinophils Relative % 2    Eosinophils Absolute 0.0 - 0.5 K/uL 0.1    Basophils Relative % 0    Basophils Absolute 0.0 - 0.1 K/uL 0.0  0.0 R  Immature Granulocytes % 1  0 R  Abs Immature  Granulocytes 0.00 - 0.07 K/uL 0.04      Ref Range & Units 2wk ago 75yr ago  Sodium 135 - 145 mmol/L 140  141 R  Potassium 3.5 - 5.1 mmol/L 3.8  4.4 R  Chloride 98 - 111 mmol/L 104  100 R  CO2 22 - 32 mmol/L 24  24 R  Glucose, Bld 70 - 99 mg/dL 77  79 R  BUN 6 - 20 mg/dL 5Low   6 R  Creatinine, Ser 0.44 - 1.00 mg/dL 0.63  0.46Low  R  Calcium 8.9 - 10.3 mg/dL 9.6  8.8 R  Total Protein 6.5 - 8.1 g/dL 8.7High     Albumin 3.5 - 5.0 g/dL 4.1    AST 15 - 41 U/L 17    ALT 0 - 44 U/L 17    Alkaline Phosphatase 38 - 126 U/L 81    Total Bilirubin 0.3 - 1.2 mg/dL 0.3    GFR calc non Af Amer >60 mL/min >60  121 R  GFR calc Af Amer >60 mL/min >60  139 R  Reticulocyte count 1.4% Haptoglobin 305 Iron/TIBC 19/478 Iron saturation 4% Ferritin 6 Vitamin B12 226 Hemoglobin electrophoresis:  Normal adult hemoglobin Antithrombin III 109 Protein C activity 181 Protein S activity 81 Factor V Leiden gene mutation: Not identified Prothrombin gene mutation ([id number]): Not detected Factor VIII 223  Ref Range & Units 2wk ago  Anticardiolipin IgG 0 - 14 GPL U/mL 62High    Comment: (NOTE)              Negative:       <15              Indeterminate:   15 - 20              Low-Med Positive: >20 - 80              High Positive:     >80   Anticardiolipin IgM 0 - 12 MPL U/mL 28High    Comment: (NOTE)              Negative:       <13              Indeterminate:   13 - 20              Low-Med Positive: >20 - 80              High Positive:     >80   Anticardiolipin IgA 0 - 11 APL U/mL 21High    Comment: (NOTE)              Negative:       <12              Indeterminate:   12 -  20              Low-Med Positive: >20 - 80              High Positive:     >80  Performed At: [facility]  [place], [place] [id number]  Rush Farmer MD [place]:[id number]     Ref Range & Units 2wk ago  Beta-2 Glyco I IgG 0 - 20 GPI IgG units 27High    Comment: (NOTE)  The reference interval reflects a 3SD or 99th percentile interval,  which is thought to represent a potentially clinically significant  result in accordance with the International Consensus Statement on  the classification criteria for definitive antiphospholipid  syndrome (APS). J Thromb Haem 2006;4:295-306.   Beta-2-Glycoprotein I IgM 0 - 32 GPI IgM units 16   Comment: (NOTE)  The reference interval reflects a 3SD or 99th percentile interval,  which is thought to represent a potentially clinically significant  result in accordance with the International Consensus Statement on  the classification criteria for definitive antiphospholipid  syndrome (APS). J Thromb Haem 2006;4:295-306.  Performed At: [facility]  [place], [place] [id number]  Rush Farmer MD PX:[id number]   Beta-2-Glycoprotein I IgA 0 - 25 GPI IgA units 17   Comment: (NOTE)  The reference interval reflects a 3SD or 99th percentile interval,  which is thought to represent a potentially clinically significant  result in accordance with the International Consensus Statement on  the classification criteria for definitive antiphospholipid  syndrome (APS). J Thromb Haem 2006;4:295-306.        Diagnostic/Imaging Studies: August 11, 2017 DIGITAL SCREENING BILATERAL MAMMOGRAM WITH CAD  COMPARISON: None.  ACR Breast Density Category a: The breast tissue is almost entirely fatty.  FINDINGS: There are no findings suspicious for malignancy. Images were processed with CAD.  IMPRESSION: No mammographic evidence of malignancy. A result letter of this screening mammogram will be mailed directly to the patient.  RECOMMENDATION: Screening mammogram in one year. (Code:SM-B-01Y)  BI-RADS CATEGORY 1: Negative.  Melissa Gaines  M.D. 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  In 2005 she presented with a butterfly rash and mouth sores and was treated initially with Plaquenil and prednisone.  Because they were affecting her eyes, she subsequently was switched to methotrexate and Plaquenil.  She continues on methotrexate to the present. On Jul 08, 2017: A complete blood count showed hemoglobin 11.9 hematocrit 36.8 MCV 38.7 MCH 25.5 RDW 15.7 WBC 6.7 with 63% neutrophils 30% lymphocytes 4% monocytes 2% eosinophils 1% basophil; platelets 302,000.  Her hepatitis C antibody was nonreactive.  Her hepatitis B surface antigen was nonreactive.  Her ANA titer on May 3 was 1: 320, speckled pattern.    On Jul 22, 2017 a lupus anticoagulant was identified. The antiphospholipid antibody IgG 46.1 (<15); antiphospholipid antibody IgM 13.4 (<15); antiphospholipid antibody IgA 21 (<15); beta-2 glycoprotein 1 antibody IgM 19.4 (<15); beta-2 glycoprotein antibody IgG G 50.2 (<15).  On Jul 22, 2017 serum iron 31 TIBC 450 iron saturation 7% ferritin 9.  Complement C3 143; complement C4 21 it appears also at the time that she had a urinary tract infection. [facility] studies were performed [facility].  At the time of our initial visit, we discussed in detail her prior history as well as the results of her laboratory studies including a previous hypercoagulable evaluation. Because of  her prior pulmonary embolism was in the peripartum period, a hypercoagulable evaluation was requested to assess and/or identify her risk for subsequent pulmonary embolism/DVT. If the antiphospholipid  antibody is still abnormal, some form of anticoagulation would be considered given her risk.  There is  She has decreased hearing in the left ear and underwent a cochlear implant while residing in [place]. She had a gastric sleeve resection in 2014. Although she has osteoporosis, by report, she is not on a bisphosphonate. Her energy level is low. She is single, never married. She has 2 healthy children. She has monthly missed menstruation unchanged over her usual baseline. She continues on methotrexate and folic acid for systemic lupus erythematosus.  Because of her anemia, laboratory studies were obtained to exclude an underlying nutritional deficiency, hemolytic process, iron deficit, or metabolic anomaly.  Those results are detailed above.   In the interim since her last visit, both her appetite and weight remain stable.  She has episodic frontal tension headaches without dizziness, lightheadedness, syncope, or near syncopal episodes. She reports no rash or itching.  She has no new visual changes; she had a cochlear implant on the left with diminished hearing.  There is no pain or difficulty in swallowing.  She has no unusual cough, sore throat, or orthopnea.  No fever, shaking chills, sweats, or flulike symptoms are reported. She has occasional episodes of reflux/heartburn but takes no medication.  There is no nausea, vomiting, diarrhea, constipation.  She denies melena or bright red blood per rectum. She has never had a screening colonoscopy.No urinary frequency, urgency, hematuria, dysuria reported. She has swelling of her ankles which does not reseed overnight. She has no bleeding tendency or easy bruisability.  She has degenerative joint disease involving the hands, fingers, hips, lower back, with "electric shock like pain" radiating from her lower back to her feet (right > left).  She has chronic paresthesias involving the feet.   Her other comorbid problems include systemic lupus erythematosus  identified initially in 2005; chronic depression; obstructive sleep apnea noncompliant with CPAP; bronchial asthma; gastroesophageal reflux disease; chronic tension headaches; sleep disorder; elevated BMI; osteoporosis; congenital hip dislocation, status post bilateral total hip arthroplasty; anemia of chronic disease; vitamin D deficiency on replacement; and previous gastric sleeve resection in 2014.   Recommendation/Plan: The results of her laboratory studies were reviewed and discussed in detail.  The antiphospholipid antibody profile is positive.  Her factor VIII assay at is also elevated both suggesting a thrombophilia.  Because factor VIII is also an acute phase reactant, it should be repeated after 12 weeks to confirm. She has no hemoglobinopathy.  A lupus anticoagulant, although requested, was not performed.  That test will be obtained today along with baseline PT and aPTT.  Unbeknownst at our first visit, she has been taking iron by mouth: 3 tablets daily at equal intervals.  Although she is iron deficient, she is not iron depleted.  She is not anemic. She was advised to continue for the present. She appears to be tolerating oral iron without difficulty.  If her menses continue to be heavy, then parenteral iron may be necessary.  It was requested that she bring in her iron tablets at the time of her next visit in order to record the brand, dose, and interval.  By report,  a duplex venous doppler was performed of her right lower extremity which was reportedly negative. Those records are not available on the existing chart.  If it was not performed, I recommend a right lower extremity duplex venous doppler to exclude a thrombus. She reports no previous deep vein thrombosis at the time of her initial pulmonary embolism.  Those records however are not available on the existing chart.  Barring any unforeseen complications, she is scheduled for return visit on November 13 to discuss those results with  recommendations.  She was advised to call us in the interim should any new or untoward problems arise.  The total time spent discussing her prior laboratory studies, their significance, and the importance of identifying a predisposing hereditary/acquired tendency toward venous thromboembolic disease or arterial vascular disease was 40 minutes.  At least 50% of that time was spent in face-to-face discussion, reviewing outside records, laboratory evaluation, counseling, and answering questions.   This note was dictated using voice activated technology/software.  Unfortunately, typographical errors are not uncommon, and transcription is subject to mistakes and regrettably misinterpretation.  If necessary, clarification of the above information can be discussed with me at any time.  FOLLOW UP: AS DIRECTED   cc:      Bufford Lope DO                 Lenice Pressman MD   Henreitta Leber, MD  Hematology/Oncology [place] [address] [place], [place] [address] Office: [phone] TGGY: [phone]

## 2018-01-03 NOTE — Telephone Encounter (Signed)
Gave pt avs and calendar  °

## 2018-01-05 LAB — DRVVT MIX: DRVVT MIX: 45.7 s (ref 0.0–47.0)

## 2018-01-05 LAB — LUPUS ANTICOAGULANT PANEL
DRVVT: 50.7 s — ABNORMAL HIGH (ref 0.0–47.0)
PTT Lupus Anticoagulant: 42 s (ref 0.0–51.9)

## 2018-01-17 DIAGNOSIS — R760 Raised antibody titer: Secondary | ICD-10-CM | POA: Diagnosis not present

## 2018-01-17 DIAGNOSIS — M329 Systemic lupus erythematosus, unspecified: Secondary | ICD-10-CM | POA: Diagnosis not present

## 2018-01-17 DIAGNOSIS — E611 Iron deficiency: Secondary | ICD-10-CM | POA: Diagnosis not present

## 2018-01-17 DIAGNOSIS — Z79899 Other long term (current) drug therapy: Secondary | ICD-10-CM | POA: Diagnosis not present

## 2018-01-17 DIAGNOSIS — Z86711 Personal history of pulmonary embolism: Secondary | ICD-10-CM | POA: Diagnosis not present

## 2018-01-18 ENCOUNTER — Telehealth: Payer: Self-pay

## 2018-01-18 ENCOUNTER — Inpatient Hospital Stay: Payer: Medicare HMO | Attending: Hematology and Oncology | Admitting: Hematology and Oncology

## 2018-01-18 ENCOUNTER — Encounter: Payer: Self-pay | Admitting: Hematology and Oncology

## 2018-01-18 VITALS — BP 120/72 | HR 84 | Temp 97.8°F | Resp 18 | Ht 59.0 in | Wt 201.9 lb

## 2018-01-18 DIAGNOSIS — Z79899 Other long term (current) drug therapy: Secondary | ICD-10-CM | POA: Diagnosis not present

## 2018-01-18 DIAGNOSIS — M329 Systemic lupus erythematosus, unspecified: Secondary | ICD-10-CM | POA: Diagnosis not present

## 2018-01-18 DIAGNOSIS — Z86711 Personal history of pulmonary embolism: Secondary | ICD-10-CM | POA: Insufficient documentation

## 2018-01-18 DIAGNOSIS — D500 Iron deficiency anemia secondary to blood loss (chronic): Secondary | ICD-10-CM

## 2018-01-18 DIAGNOSIS — D649 Anemia, unspecified: Secondary | ICD-10-CM

## 2018-01-18 NOTE — Telephone Encounter (Signed)
Printed avs and calender of upcoming appointment. Per 11/13 los 

## 2018-01-18 NOTE — Progress Notes (Signed)
Hematology/Oncology Outpatient Progress Note  Patient Name:  Melissa Gaines DOB:01-22-1971    Date of Service: January 18, 2018  Primary Care Provider Lauretta Chester [address] [place], [place] [address]  Referring Provider: Gunnar Fusi ZiokowskaMD  Consulting Physician: Henreitta Leber, MD Hematology/Oncology  Reason for Visit: In the setting of a previousperipartumpulmonary embolismwhile residing in [place] in 2010 ([facility]);with anticoagulation discontinued after 6 months without recurrence,she presents now for the results of her laboratory evaluation and recommendations in the setting of thrombophilia.   Brief History: WandaRodriguez-Almeydais a 47 year old Hispanic Jehovah's Witness,[place] past medical history is significant for systemic lupus erythematosus identified initially in 2005; chronic depression; obstructive sleep apneanoncompliant with CPAP; bronchial asthma; gastroesophageal reflux disease; chronic tension headaches; sleep disorder; elevated BMI; osteoporosis; congenital hip dislocation,status post bilateral total hip arthroplasty; anemia of chronic disease; vitamin D deficiency on replacement; and previous gastric sleeve resection in 2014.Her primary care provider is Dr. Delrae Rend [facility]. She is followed also by Dr. Lenice Pressman, rheumatology.She is alone at this visit.  In 2005 she presented with a butterfly rash and mouth sores and was treated initially with Plaquenil and prednisone. Because they were affecting her eyes, she subsequently was switched to methotrexate and Plaquenil. She continues on methotrexate to the present.On Jul 08, 2017: A complete blood count showed hemoglobin 11.9 hematocrit 36.8 MCV 38.7 MCH 25.5 RDW 15.7 WBC 6.7 with 63% neutrophils 30% lymphocytes 4% monocytes 2% eosinophils 1% basophil;  platelets 302,000. Her hepatitis C antibody was nonreactive. Her hepatitis B surface antigen was nonreactive. Her ANA titer on May 3 was 1: 320, speckled pattern. On Jul 22, 2017 a lupus anticoagulant was identified.The antiphospholipid antibody IgG 46.1 (<15); antiphospholipid antibody IgM 13.4 (<15); antiphospholipid antibody IgA 21(<15);beta-2 glycoprotein 1 antibody IgM19.4 (<15); beta-2 glycoprotein antibody IgG G 50.2 (<15).On Jul 22, 2017 serum iron 31 TIBC 450 iron saturation 7% ferritin 9. Complement C3 143; complement C4 21 it appears also at the time that she had a urinary tract infection. The studies were performed at [facility].  At the time of our initial visit, we discussed in detail her prior history as well as the results of her laboratory studies including a previous hypercoagulable evaluation. Because of her priorpulmonary embolism was in the peripartum period,a hypercoagulable evaluation was requested to assess and/or identify herrisk for subsequent pulmonary embolism/DVT. If the antiphospholipid antibody is still abnormal, some form of anticoagulation would be considered given the risk.  By report, a duplex venousdoppler was performed ofherright lower extremity which was reportedly negative. Those records are not available on the existing chart. If it was not performed, I recommend a right lower extremity duplex venous doppler to exclude a thrombus. She reports no previous deep vein thrombosis at the time of her initial pulmonary embolism. Those records however are not available on the existing chart. She has decreased hearing in the left ear and underwent a cochlear implant while residing in [place]. She had a gastric sleeve resection in 2014. Although she has osteoporosis, by report, she is not on a bisphosphonate. Her energy level is low. She is single, never married. She has 2 healthy children. She has monthly menstruation unchanged  over her usual baseline. Only yesterday methotrexate was discontinued for systemic lupus erythematosus.   At the time of her last visit, we discussed the results of her hypercoagulable evaluation.  The antiphospholipid antibody profile was positive. This was the second antiphospholipid antibody profile  that was positive since May.  Her factor VIII assay was also elevated.  Because factor VIII is also an acute phase reactant, it should be repeated after 12 weeks to confirm. She has no hemoglobinopathy.  The lupus anticoagulant, and baseline PT and aPTT were normal.  Because ofheranemia, laboratory studies were obtained to exclude an underlying nutritional deficiency, hemolytic process, iron deficit,or metabolic anomaly. Those results are detailed below. Unbeknownst at our first visit, she has been taking iron by mouth: 3 tablets daily for years.  Although she is iron deficient, she is not iron depleted.  She is not anemic. She was advised to continue for the present. She appears to be tolerating oral iron without difficulty.  If her menses continue to be heavy, then parenteral iron may be an option. It is with this background she presents now for further discussion regarding her previous laboratory evaluation from October 14and October 29 to exclude a hereditary/acquired thrombophiliain the setting of previousperipartumpulmonary embolism presumably without lower extremity deep vein thrombosis in 2010, currently not on anticoagulation therapy as described above.  Interval History: In the interim since her last visit, both her appetite and weight remain stable. She has episodic frontaltension headaches without dizziness, lightheadedness, syncope, or near syncopal episodes. She reports no rash or itching. She has no new visual changes;she had a cochlear implant on the left with diminished hearing. There is no pain or difficulty in swallowing. She has no unusual cough, sore throat, or orthopnea. No  fever, shaking chills, sweats, or flulike symptoms are reported. She has occasional episodes of reflux/heartburn but takes no medication. There is no nausea, vomiting, diarrhea, constipation. She denies melena or bright red blood per rectum. She has never had a screening colonoscopy. No urinary frequency, urgency, hematuria, dysuria reported. She has swelling of her ankleswhich does not reseed overnight. She has no bleeding tendency or easy bruisability.She has degenerative joint disease involving the hands, fingers, hips, lower back, with "electric shock like pain" radiating from her lower back to her feet (right >left). She has chronic paresthesias involving the feet.   Past Medical History Reviewed        Family History Reviewed       Social History Reviewed      Past Medical History:  Diagnosis Date  . Anemia   . Asthma   . Chronic pain   . Congenital hip dislocation   . GERD (gastroesophageal reflux disease)   . Kidney mass 2017  . Migraine   . Osteoporosis   . Sleep apnea   . Systemic lupus erythematosus (HCC)         Allergies  Allergen Reactions  . 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 Throat closes and short of breath.  She has no medical allergies. She has nonspecific seasonal allergies.      Current Outpatient Medications on File Prior to Visit  Medication Sig  . diclofenac sodium (VOLTAREN) 1 % GEL Apply 2 g topically 4 (four) times daily. (Patient not taking: Reported on 12/19/2017)  . folic acid (FOLVITE) 1 MG tablet Take 1 tablet by mouth daily.  [name] Kitchen ibuprofen (ADVIL,MOTRIN) 600 MG tablet Take 1 tablet (600 mg total) by mouth every 6 (six) hours as needed for mild pain or moderate pain.  . methotrexate (RHEUMATREX) 2.5 MG tablet Take 6 tablets by mouth once a week.  . topiramate (TOPAMAX) 100 MG tablet Take 1 tablet (100 mg total) by mouth daily. (Patient not taking: Reported on 12/19/2017)  .  Vitamin D, Ergocalciferol, (DRISDOL)  50000 units CAPS capsule Take 1 capsule (50,000 Units total) by mouth every 7 (seven) days.   No current facility-administered medications on file prior to visit.     Review of Systems: Constitutional: No fever, sweats, or shaking chills. No appetite or weight deficit. Elevated BMI. Skin: No rash, scaling, sores, lumps, or jaundice. HEENT: No visual changes;left cochlear implant andhearing deficit. Pulmonary: No unusual cough, sore throat, or orthopnea;DOE;bronchial asthma. Breasts: No complaints. Cardiovascular: No coronary artery disease, angina, or myocardial infarction. No cardiac dysrhythmia, essential hypertension, or dyslipidemia. Gastrointestinal: No indigestion, dysphagia, abdominal pain, diarrhea, or constipation.No change in bowel habits;GERD.No nausea or vomiting. No melena or bright red blood per rectum. Gastric sleeve resection, 2014. Genitourinary: Nourinaryfrequency, urgency, hematuria, or dysuria. Musculoskeletal:Congenital dislocated hips; status post arthroplasty. Degenerative joint disease involving the fingers, hands, hips, lower back, with extension into the lower extremities,right >left. Nonewarthralgias or myalgias; no joint swelling, pain, or instability.Right lower extremity pain without swelling SLE. Hematologic: No bleeding tendency or easy bruisability. Endocrine: No intolerance to heat or cold; no thyroid disease or diabetes mellitus. Vascular: No peripheral arterial or venous thromboembolic disease. Psychological:Chronicdepressionwithout suicidal ideation.No anxiety disorder or panic attacks;or mood changes. Neurological:Chronic tension headaches 3 times weekly. Topamax is ineffective. Disequilibrium and imbalance due to back and hip pain. No dizziness, lightheadedness, syncope, or near syncopal episodes;paresthesias involving the lower extremities;bilateral congenital hip dislocation.  Physical Examination:  Vital Signs: Body  surface area is 1.95 meters squared. Body mass index is 40.78 kg/m.    01/18/18 1012  BP: 120/72  Pulse: 84  Resp: 18  Temp: 97.8 F (36.6 C)  SpO2: 98%    Filed Weights   01/18/18 1012  Weight: 201 lb 14.4 oz (91.6 kg)  ECOG PERFORMANCE STATUS:1 Constitutional:WandaRodriguez-Almeydais fully nourished and developedalbeit overweight. She looks age appropriate. She is friendly and cooperative without respiratory compromise at rest. Skin: No rashes, scaling, dryness, jaundice, or itching. HEENT: Head is normocephalic and atraumatic. Pupils are equal round and reactive to light and accommodation. Sclerae are anicteric. Conjunctivae are pink. No sinus tenderness nor oropharyngeal lesions. Lips without cracking or peeling; tongue without mass, inflammation, or nodularity. Mucous membranes are moist. Neck: Supple and symmetric. No jugular venous distention or thyromegaly. Trachea is midline. Lymphatics: No cervical or supraclavicular lymphadenopathy. No epitrochlear, axillary, or inguinal lymphadenopathy is appreciated. Respiratory/chest: Thorax is symmetrical. Breath sounds are clear to auscultation and percussion. Normal excursion and respiratory effort. Back: Symmetric without deformity or tenderness. Cardiovascular: Heart rate and rhythm are regular without murmurs. Gastrointestinal: Abdomen is soft, nontender; no organomegaly. Bowel sounds are normoactive. No masses are appreciated. Extremities: In the lower extremities, there is no asymmetric swelling, erythema, or cord formation.She has pain in the right lower extremity on palpation. No clubbing, cyanosis, nor edema. Hematologic: No petechiae, hematomas, or ecchymoses. Psychological: She is oriented to person, place, and time; normal affect, memory, andcognition. Neurological:There are no gross neurologic deficits.  Laboratory Results: January 03, 2018 PT/INR 12.3/0.92 aPTT 38 D-dimer 0.48 Lupus  anticoagulant: Not identified.  December 19, 2017  Ref Range & Units 2wk ago 22yr ago  WBC Count 4.0 - 10.5 K/uL 7.1  6.4 R  RBC 3.87 - 5.11 MIL/uL 5.36High   5.42High  R  Hemoglobin 12.0 - 15.0 g/dL 12.2  13.5 R  HCT 36.0 - 46.0 % 39.4  42.6 R  MCV 80.0 - 100.0 fL 73.5Low   79 R  MCH 26.0 - 34.0 pg 22.8Low   24.9Low  R  MCHC  30.0 - 36.0 g/dL 31.0  31.7 R  RDW 11.5 - 15.5 % 19.9High   21.7High  R  Platelet Count 150 - 400 K/uL 312  253 R  nRBC 0.0 - 0.2 % 0.0    Neutrophils Relative % % 53  58 R  Neutro Abs 1.7 - 7.7 K/uL 3.8  3.7 R  Lymphocytes Relative % 40    Lymphs Abs 0.7 - 4.0 K/uL 2.8  2.2 R  Monocytes Relative % 4    Monocytes Absolute 0.1 - 1.0 K/uL 0.3    Eosinophils Relative % 2    Eosinophils Absolute 0.0 - 0.5 K/uL 0.1    Basophils Relative % 0    Basophils Absolute 0.0 - 0.1 K/uL 0.0  0.0 R  Immature Granulocytes % 1  0 R  Abs Immature Granulocytes 0.00 - 0.07 K/uL 0.04      Ref Range & Units 2wk ago 27yr ago  Sodium 135 - 145 mmol/L 140  141 R  Potassium 3.5 - 5.1 mmol/L 3.8  4.4 R  Chloride 98 - 111 mmol/L 104  100 R  CO2 22 - 32 mmol/L 24  24 R  Glucose, Bld 70 - 99 mg/dL 77  79 R  BUN 6 - 20 mg/dL 5Low   6 R  Creatinine, Ser 0.44 - 1.00 mg/dL 0.63  0.46Low  R  Calcium 8.9 - 10.3 mg/dL 9.6  8.8 R  Total Protein 6.5 - 8.1 g/dL 8.7High     Albumin 3.5 - 5.0 g/dL 4.1    AST 15 - 41 U/L 17    ALT 0 - 44 U/L 17    Alkaline Phosphatase 38 - 126 U/L 81    Total Bilirubin 0.3 - 1.2 mg/dL 0.3    GFR calc non Af Amer >60 mL/min >60  121 R  GFR calc Af Amer >60 mL/min >60  139 R  Reticulocyte count 1.4% Haptoglobin 305 Iron/TIBC 19/478 Iron saturation 4% Ferritin 6 Vitamin B12 226 Hemoglobin electrophoresis:  Normal adult hemoglobin Antithrombin III 109 Protein C activity 181 Protein S activity 81 Factor V Leiden gene mutation: Not identified Prothrombin gene mutation ([id number]): Not detected Factor VIII 223  Ref Range & Units 2wk ago    Anticardiolipin IgG 0 - 14 GPL U/mL 62High    Comment: (NOTE)              Negative:       <15              Indeterminate:   15 - 20              Low-Med Positive: >20 - 80              High Positive:     >80   Anticardiolipin IgM 0 - 12 MPL U/mL 28High    Comment: (NOTE)              Negative:       <13              Indeterminate:   13 - 20              Low-Med Positive: >20 - 80              High Positive:     >80   Anticardiolipin IgA 0 - 11 APL U/mL 21High    Comment: (NOTE)              Negative:       <  12              Indeterminate:   12 - 20              Low-Med Positive: >20 - 80              High Positive:     >80  Performed At: [facility]  [place], [place] [id number]  Rush Farmer MD YS:[id number]     Ref Range & Units 2wk ago  Beta-2 Glyco I IgG 0 - 20 GPI IgG units 27High    Comment: (NOTE)  The reference interval reflects a 3SD or 99th percentile interval,  which is thought to represent a potentially clinically significant  result in accordance with the International Consensus Statement on  the classification criteria for definitive antiphospholipid  syndrome (APS). J Thromb Haem 2006;4:295-306.   Beta-2-Glycoprotein I IgM 0 - 32 GPI IgM units 16   Comment: (NOTE)  The reference interval reflects a 3SD or 99th percentile interval,  which is thought to represent a potentially clinically significant  result in accordance with the International Consensus Statement on  the classification criteria for definitive antiphospholipid  syndrome (APS). J Thromb Haem 2006;4:295-306.  Performed At: [facility]  [place], [place] [id number]  Rush Farmer MD [id number]   Beta-2-Glycoprotein I IgA 0 - 25 GPI IgA units 17    Comment: (NOTE)  The reference interval reflects a 3SD or 99th percentile interval,  which is thought to represent a potentially clinically significant  result in accordance with the International Consensus Statement on  the classification criteria for definitive antiphospholipid  syndrome (APS). J Thromb Haem 2006;4:295-306.        Diagnostic/Imaging Studies: August 11, 2017 DIGITAL SCREENING BILATERAL MAMMOGRAM WITH CAD  COMPARISON: None.  ACR Breast Density Category a: The breast tissue is almost entirely fatty.  FINDINGS: There are no findings suspicious for malignancy. Images were processed with CAD.  IMPRESSION: No mammographic evidence of malignancy. A result letter of this screening mammogram will be mailed directly to the patient.  RECOMMENDATION: Screening mammogram in one year. (Code:SM-B-01Y)  BI-RADS CATEGORY 1: Negative.  Melissa Gaines M.D. 08/31/2017 14:57  Summary/Assessment: In the setting of a previousperipartumpulmonary embolismwhile [place] in [place] in [place] ([facility]);with anticoagulation discontinued after 6 months without recurrence,she presents now for the results of her laboratory evaluation and recommendations in the setting of thrombophilia.   In 2005 she presented with a butterfly rash and mouth sores and was treated initially with Plaquenil and prednisone. Because they were affecting her eyes, she subsequently was switched to methotrexate and Plaquenil. She continues on methotrexate to the present.On Jul 08, 2017: A complete blood count showed hemoglobin 11.9 hematocrit 36.8 MCV 38.7 MCH 25.5 RDW 15.7 WBC 6.7 with 63% neutrophils 30% lymphocytes 4% monocytes 2% eosinophils 1% basophil; platelets 302,000. Her hepatitis C antibody was nonreactive. Her hepatitis B surface antigen was nonreactive. Her ANA titer on May 3 was 1: 320, speckled pattern. On Jul 22, 2017 a lupus anticoagulant was  identified.The antiphospholipid antibody IgG 46.1 (<15); antiphospholipid antibody IgM 13.4 (<15); antiphospholipid antibody IgA 21(<15);beta-2 glycoprotein 1 antibody IgM19.4 (<15); beta-2 glycoprotein antibody IgG G 50.2 (<15).On Jul 22, 2017 serum iron 31 TIBC 450 iron saturation 7% ferritin 9. Complement C3 143; complement C4 21 it appears also at the time that she had a urinary tract infection. The studies were performed at [facility].  At the time  of our initial visit, we discussed in detail her prior history as well as the results of her laboratory studies including a previous hypercoagulable evaluation. Because of her priorpulmonary embolism was in the peripartum period,a hypercoagulable evaluation was requested to assess and/or identify herrisk for subsequent pulmonary embolism/DVT. If the antiphospholipid antibody was still abnormal, some form of anticoagulation would be considered given the risk.  By report, a duplex venousdoppler was performed ofherright lower extremity which was reportedly negative. Those records are not available on the existing chart. If it was not performed, I recommend a right lower extremity duplex venous doppler to exclude a thrombus. She reports no previous deep vein thrombosis at the time of her initial pulmonary embolism. Those records however are not available on the existing chart. She has 2 healthy children. She has monthly menstruation unchanged over her usual baseline. Only yesterday methotrexate was discontinued for systemic lupus erythematosus.   At the time of her last visit, we discussed the results of her hypercoagulable evaluation.  The antiphospholipid antibody profile was positive. This was the second antiphospholipid antibody profile that was positive since May.  Her factor VIII assay was also elevated.  Because factor VIII is also an acute phase reactant, it should be repeated after 12 weeks to confirm. She has  no hemoglobinopathy.  The lupus anticoagulant, and baseline PT and aPTT were normal.  Because ofheranemia, laboratory studies were obtained to exclude an underlying nutritional deficiency, hemolytic process, iron deficit,or metabolic anomaly. Those results are detailed below. Unbeknownst at our first visit, she has been taking iron by mouth: 3 tablets daily for years.  Although she is iron deficient, she is not iron depleted.  She is not anemic. She was advised to continue for the present. She appears to be tolerating oral iron without difficulty.  If her menses continue to be heavy, then parenteral iron may be an option.  In the interim since her last visit, both her appetite and weight remain stable. She has episodic frontaltension headaches without dizziness, lightheadedness, syncope, or near syncopal episodes. She reports no rash or itching. She has no new visual changes;she had a cochlear implant on the left with diminished hearing. There is no pain or difficulty in swallowing. She has no unusual cough, sore throat, or orthopnea. No fever, shaking chills, sweats, or flulike symptoms are reported. She has occasional episodes of reflux/heartburn but takes no medication. There is no nausea, vomiting, diarrhea, constipation. She denies melena or bright red blood per rectum. She has never had a screening colonoscopy.No urinary frequency, urgency, hematuria, dysuria reported. She has swelling of her ankleswhich does not reseed overnight. She has no bleeding tendency or easy bruisability.She has degenerative joint disease involving the hands, fingers, hips, lower back, with "electric shock like pain" radiating from her lower back to her feet (right >left). She has chronic paresthesias involving the feet.   Her other comorbid problems include systemic lupus erythematosus identified initially in 2005; chronic depression; obstructive sleep apneanoncompliant with CPAP; bronchial asthma;  gastroesophageal reflux disease; chronic tension headaches; sleep disorder; elevated BMI; osteoporosis; congenital hip dislocation,status post bilateral total hip arthroplasty; anemia of chronic disease; vitamin D deficiency on replacement; and previous gastric sleeve resection in 2014.  Recommendation/Plan: The results of her laboratory studies were reviewed and discussed in detail.  The antiphospholipid antibody profile is positive.  Her factor VIII assay is also elevated, both suggesting thrombophilia.  Because factor VIII is an acute phase reactant, it should be repeated after 12 weeks to confirm. She  has no hemoglobinopathy. The lupus anticoagulant, d-dimer, PT and aPTT are normal.  Because the antiphospholipid antibody profile was positive on 2 separate occasions greater than 12 weeks apart, and in light of the fact that she had a pulmonary embolism previously with a known risk factor, systemic lupus erythematosus, I recommended that she consider long-term anticoagulation with 1 of the newer DOAC such as apixaban. Because factor VIII itself can be misleading as an acute phase reactant, I would not place significant emphasis on it.  It can be repeated in 12 weeks to confirm.  Her level at 223 is high and generally considered an independent risk factor for VTE.  I recommended that she discuss restarting anticoagulation with her primary care physician, Dr. Shawna Orleans, and her rheumatologist, Dr. Lyda Perone.  If comfortable, they can start her on either apixaban or rivaroxaban.  Alternatively, we discussed obtaining an outside second opinion at [facility] with Dr. Joan Flores, a worldwide anticoagulation specialist.  I would be more than willing to facilitate that visit if requested.  She was advised to continue ferrous sulfate: 325 mg 3 times daily for the present. She appears to be tolerating oral iron without difficulty.  If her menses continue to be heavy, then parenteral iron may be necessary.   Barring  any unforeseen complications, she is scheduled for return visit on December 30 for repeat blood count and ferritin.  She was advised to call us in the interim should any new or untoward problems arise.  The total time spent discussingher laboratory studies, their significance, and the identifiable predisposing  tendency toward venous thromboembolic disease or arterial vascular disease was 25 minutes. At least 50% of that time was spent in face-to-face discussion, reviewing outside records, laboratory evaluation, counseling, and answering questions.   This note was dictated using voice activated technology/software.  Unfortunately, typographical errors are not uncommon, and transcription is subject to mistakes and regrettably misinterpretation.  If necessary, clarification of the above information can be discussed with me at any time.  FOLLOW UP: AS DIRECTED   BR:AXENM JYooDO Aldona ZiokowskaMD   Henreitta Leber, MD  Hematology/Oncology [facility] [address] [place], [place] [address] Office: [phone] VOPF: [phone]

## 2018-01-18 NOTE — Patient Instructions (Signed)
We discussed the results of your hypercoagulable evaluation in detail.  Because the anti-cardiolipin antibody profile was positive on 2 separate occasions greater than 12 weeks apart, and in light of the fact that you had a pulmonary embolism previously with unknown risk factor of systemic lupus erythematosus, you should consider long-term anticoagulation with 1 of the newer oral anticoagulants such as Xarelto or Eliquis.  As mentioned before, your factor VIII level was also elevated.  This by itself, in the presence of SLE, is not the only determinant in recommending anticoagulation.  Discussed this with Dr. you your primary care physician.  If you wish to get a second opinion, I would recommend [facility] in [facility] with Dr. Joan Flores a coagulation specialist.  Please let us know if we can facilitate.  Continue ferrous sulfate: 325 mg 3 times daily as tolerated.  Barring any unforeseen complications, your next scheduled doctor visit with laboratory studies is on December 30.  Please call if any new or untoward problems arise.

## 2018-01-25 ENCOUNTER — Other Ambulatory Visit: Payer: Self-pay

## 2018-01-25 ENCOUNTER — Encounter: Payer: Self-pay | Admitting: Family Medicine

## 2018-01-25 ENCOUNTER — Ambulatory Visit (INDEPENDENT_AMBULATORY_CARE_PROVIDER_SITE_OTHER): Payer: Medicare HMO | Admitting: Family Medicine

## 2018-01-25 VITALS — BP 108/62 | HR 90 | Temp 98.0°F | Wt 202.0 lb

## 2018-01-25 DIAGNOSIS — D6861 Antiphospholipid syndrome: Secondary | ICD-10-CM | POA: Diagnosis not present

## 2018-01-25 DIAGNOSIS — Z114 Encounter for screening for human immunodeficiency virus [HIV]: Secondary | ICD-10-CM | POA: Diagnosis not present

## 2018-01-25 DIAGNOSIS — G43809 Other migraine, not intractable, without status migrainosus: Secondary | ICD-10-CM | POA: Diagnosis not present

## 2018-01-25 MED ORDER — APIXABAN 2.5 MG PO TABS: 2.5000 mg | ORAL_TABLET | Freq: Two times a day (BID) | ORAL | 2 refills | Status: DC

## 2018-01-25 MED ORDER — TOPIRAMATE 100 MG PO TABS: 100.0000 mg | ORAL_TABLET | Freq: Every day | ORAL | 2 refills | Status: DC

## 2018-01-25 NOTE — Assessment & Plan Note (Signed)
Reviewed heme/onc notes which detail that given positive antiphospholipid antibody along with h/o peripartum PE that patient would benefit from being on a DOAC. Start eliquis 2.5mg  BID. She will need q66mo CBC and yearly renal function monitoring. 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 Patient voiced good understanding.

## 2018-01-25 NOTE — Progress Notes (Signed)
    Subjective:  Melissa Gaines is a 47 y.o. female who presents to the [facility] today with a chief complaint of dizziness and anticoagulation.   HPI:  Patient states that she has been having her chronic migraines that are worse than usual lately since running out of her topamax and also with the stress of her children's behavioral issues. She has noticed 2 episodes of room spinning over the last few months that was short lived and self resolved. No changes in vision or hearing. No presyncope. She has been trying to stay well hydrated which seems to help.  She is also here today to discuss starting anticoagulation as recommended by her rheumatologist and hematologist. She states that when she was on a blood thinner in [place] for a PE at that time, she was on an oral medications that she thinks was not warfarin. She recalls being counseled on the risk of bleeding at that time but she thankfully did not have any issues.    ROS: Per HPI  Social Hx: She reports that she has never smoked. She has never used smokeless tobacco. She reports that she does not drink alcohol or use drugs.   Objective:  Physical Exam: BP 108/62   Pulse 90   Temp 98 F (36.7 C) (Oral)   Wt 202 lb (91.6 kg)   LMP 12/30/2017   SpO2 96%   BMI 40.80 kg/m   Gen: NAD, resting comfortably CV: RRR with no murmurs appreciated Pulm: NWOB, CTAB with no crackles, wheezes, or rhonchi GI: Normal bowel sounds present. Soft, Nontender, Nondistended. MSK: no edema, cyanosis, or clubbing noted. T Skin: warm, dry Neuro: grossly normal, moves all extremities   Assessment/Plan:  Migraine Refilled topamax. Vertigo may or may not be related but seems infrequent. Discussed good hydration and return precautions.  Antiphospholipid antibody syndrome [facility]) Reviewed heme/onc notes which detail that given positive antiphospholipid antibody along with h/o peripartum PE that patient would benefit from being on a DOAC. Start  eliquis 2.5mg  BID. She will need q52mo CBC and yearly renal function monitoring. 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 Patient voiced good understanding.   Bufford Lope, DO PGY-3, [facility] 01/25/2018 9:41 AM

## 2018-01-25 NOTE — Assessment & Plan Note (Signed)
Refilled topamax. Vertigo may or may not be related but seems infrequent. Discussed good hydration and return precautions.

## 2018-01-25 NOTE — Patient Instructions (Addendum)
Start taking apixiban 2.5mg  twice a day    Apixaban oral tablets What is this medicine? APIXABAN (a PIX a ban) is an anticoagulant (blood thinner). It is used to lower the chance of stroke in people with a medical condition called atrial fibrillation. It is also used to treat or prevent blood clots in the lungs or in the veins. This medicine may be used for other purposes; ask your health care provider or pharmacist if you have questions. COMMON BRAND NAME(S): Eliquis What should I tell my health care provider before I take this medicine? They need to know if you have any of these conditions: -bleeding disorders -bleeding in the brain -blood in your stools (black or tarry stools) or if you have blood in your vomit -history of stomach bleeding -kidney disease -liver disease -mechanical heart valve -an unusual or allergic reaction to apixaban, other medicines, foods, dyes, or preservatives -pregnant or trying to get pregnant -breast-feeding How should I use this medicine? Take this medicine by mouth with a glass of water. Follow the directions on the prescription label. You can take it with or without food. If it upsets your stomach, take it with food. Take your medicine at regular intervals. Do not take it more often than directed. Do not stop taking except on your doctor's advice. Stopping this medicine may increase your risk of a blot clot. Be sure to refill your prescription before you run out of medicine. Talk to your pediatrician regarding the use of this medicine in children. Special care may be needed. Overdosage: If you think you have taken too much of this medicine contact a poison control center or emergency room at once. NOTE: This medicine is only for you. Do not share this medicine with others. What if I miss a dose? If you miss a dose, take it as soon as you can. If it is almost time for your next dose, take only that dose. Do not take double or extra doses. What may interact  with this medicine? This medicine may interact with the following: -aspirin and aspirin-like medicines -certain medicines for fungal infections like ketoconazole and itraconazole -certain medicines for seizures like carbamazepine and phenytoin -certain medicines that treat or prevent blood clots like warfarin, enoxaparin, and dalteparin -clarithromycin -NSAIDs, medicines for pain and inflammation, like ibuprofen or naproxen -rifampin -ritonavir -St. John's wort This list may not describe all possible interactions. Give your health care provider a list of all the medicines, herbs, non-prescription drugs, or dietary supplements you use. Also tell them if you smoke, drink alcohol, or use illegal drugs. Some items may interact with your medicine. What should I watch for while using this medicine? Visit your doctor or health care professional for regular checks on your progress. Notify your doctor or health care professional and seek emergency treatment if you develop breathing problems; changes in vision; chest pain; severe, sudden headache; pain, swelling, warmth in the leg; trouble speaking; sudden numbness or weakness of the face, arm or leg. These can be signs that your condition has gotten worse. If you are going to have surgery or other procedure, tell your doctor that you are taking this medicine. What side effects may I notice from receiving this medicine? Side effects that you should report to your doctor or health care professional as soon as possible: -allergic reactions like skin rash, itching or hives, swelling of the face, lips, or tongue -signs and symptoms of bleeding such as bloody or black, tarry stools; red or dark-brown urine;  spitting up blood or brown material that looks like coffee grounds; red spots on the skin; unusual bruising or bleeding from the eye, gums, or nose This list may not describe all possible side effects. Call your doctor for medical advice about side effects.  You may report side effects to FDA at 1-800-FDA-1088. Where should I keep my medicine? Keep out of the reach of children. Store at room temperature between 20 and 25 degrees C (68 and 77 degrees F). Throw away any unused medicine after the expiration date. NOTE: This sheet is a summary. It may not cover all possible information. If you have questions about this medicine, talk to your doctor, pharmacist, or health care provider.  2018 Elsevier/Gold Standard (2015-09-15 11:54:23)    Vertigo Vertigo means that you feel like you are moving when you are not. Vertigo can also make you feel like things around you are moving when they are not. This feeling can come and go at any time. Vertigo often goes away on its own. Follow these instructions at home:  Avoid making fast movements.  Avoid driving.  Avoid using heavy machinery.  Avoid doing any task or activity that might cause danger to you or other people if you would have a vertigo attack while you are doing it.  Sit down right away if you feel dizzy or have trouble with your balance.  Take over-the-counter and prescription medicines only as told by your doctor.  Follow instructions from your doctor about which positions or movements you should avoid.  Drink enough fluid to keep your pee (urine) clear or pale yellow.  Keep all follow-up visits as told by your doctor. This is important. Contact a doctor if:  Medicine does not help your vertigo.  You have a fever.  Your problems get worse or you have new symptoms.  Your family or friends see changes in your behavior.  You feel sick to your stomach (nauseous) or you throw up (vomit).  You have a "pins and needles" feeling or you are numb in part of your body. Get help right away if:  You have trouble moving or talking.  You are always dizzy.  You pass out (faint).  You get very bad headaches.  You feel weak or have trouble using your hands, arms, or legs.  You have  changes in your hearing.  You have changes in your seeing (vision).  You get a stiff neck.  Bright light starts to bother you. This information is not intended to replace advice given to you by your health care provider. Make sure you discuss any questions you have with your health care provider. Document Released: 12/02/2007 Document Revised: 07/31/2015 Document Reviewed: 06/17/2014 Elsevier Interactive Patient Education  [employer].

## 2018-01-26 ENCOUNTER — Encounter: Payer: Self-pay | Admitting: Family Medicine

## 2018-01-26 LAB — HIV ANTIBODY (ROUTINE TESTING W REFLEX): HIV SCREEN 4TH GENERATION: NONREACTIVE

## 2018-02-01 DIAGNOSIS — R809 Proteinuria, unspecified: Secondary | ICD-10-CM | POA: Diagnosis not present

## 2018-02-14 ENCOUNTER — Ambulatory Visit (INDEPENDENT_AMBULATORY_CARE_PROVIDER_SITE_OTHER): Payer: Medicare HMO | Admitting: Psychiatry

## 2018-02-14 ENCOUNTER — Encounter (HOSPITAL_COMMUNITY): Payer: Self-pay | Admitting: Psychiatry

## 2018-02-14 VITALS — BP 108/75 | HR 76 | Ht <= 58 in | Wt 208.0 lb

## 2018-02-14 DIAGNOSIS — F331 Major depressive disorder, recurrent, moderate: Secondary | ICD-10-CM

## 2018-02-14 DIAGNOSIS — F4310 Post-traumatic stress disorder, unspecified: Secondary | ICD-10-CM | POA: Diagnosis not present

## 2018-02-14 MED ORDER — PAROXETINE HCL 20 MG PO TABS: ORAL_TABLET | ORAL | 1 refills | Status: DC

## 2018-02-14 MED ORDER — HYDROXYZINE PAMOATE 25 MG PO CAPS: ORAL_CAPSULE | ORAL | 0 refills | Status: DC
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
## 2018-02-28 ENCOUNTER — Telehealth: Payer: Self-pay | Admitting: Internal Medicine

## 2018-02-28 NOTE — Telephone Encounter (Signed)
RR out moved 12/30 lab/fu to [facility] 1/9. Left message. Schedule mailed.

## 2018-03-06 ENCOUNTER — Other Ambulatory Visit: Payer: Self-pay

## 2018-03-06 ENCOUNTER — Ambulatory Visit: Payer: Self-pay | Admitting: Hematology and Oncology

## 2018-03-14 ENCOUNTER — Telehealth: Payer: Self-pay | Admitting: Internal Medicine

## 2018-03-14 NOTE — Telephone Encounter (Signed)
R/s appt per 1/7 sch message - r/s per patient request unable to come in in the afternoon need AM appt around 10

## 2018-03-16 ENCOUNTER — Inpatient Hospital Stay: Payer: Medicare HMO | Admitting: Internal Medicine

## 2018-03-16 ENCOUNTER — Inpatient Hospital Stay: Payer: Medicare HMO

## 2018-03-29 ENCOUNTER — Other Ambulatory Visit: Payer: Self-pay

## 2018-03-29 DIAGNOSIS — D500 Iron deficiency anemia secondary to blood loss (chronic): Secondary | ICD-10-CM | POA: Diagnosis not present

## 2018-03-29 DIAGNOSIS — R310 Gross hematuria: Secondary | ICD-10-CM | POA: Insufficient documentation

## 2018-03-29 DIAGNOSIS — Z7901 Long term (current) use of anticoagulants: Secondary | ICD-10-CM | POA: Insufficient documentation

## 2018-03-29 DIAGNOSIS — G43809 Other migraine, not intractable, without status migrainosus: Secondary | ICD-10-CM

## 2018-03-29 DIAGNOSIS — D649 Anemia, unspecified: Secondary | ICD-10-CM | POA: Insufficient documentation

## 2018-03-29 DIAGNOSIS — D6861 Antiphospholipid syndrome: Secondary | ICD-10-CM

## 2018-03-29 MED ORDER — APIXABAN 2.5 MG PO TABS: 2.5000 mg | ORAL_TABLET | Freq: Two times a day (BID) | ORAL | 2 refills | Status: DC

## 2018-03-29 MED ORDER — TOPIRAMATE 100 MG PO TABS: 100.0000 mg | ORAL_TABLET | Freq: Every day | ORAL | 2 refills | Status: DC

## 2018-03-29 NOTE — Telephone Encounter (Signed)
Pt calling asking for Rx's be sent to [facility]. Pt was told by [facility] that we would have to send new Rx's to [facility].  Ottis Stain, CMA

## 2018-04-07 ENCOUNTER — Inpatient Hospital Stay: Payer: Medicare HMO | Admitting: Internal Medicine

## 2018-04-07 ENCOUNTER — Inpatient Hospital Stay: Payer: Medicare HMO

## 2018-04-11 ENCOUNTER — Encounter (HOSPITAL_COMMUNITY): Payer: Self-pay | Admitting: Psychiatry

## 2018-04-11 ENCOUNTER — Ambulatory Visit (INDEPENDENT_AMBULATORY_CARE_PROVIDER_SITE_OTHER): Payer: Medicare HMO | Admitting: Psychiatry

## 2018-04-11 DIAGNOSIS — F331 Major depressive disorder, recurrent, moderate: Secondary | ICD-10-CM

## 2018-04-11 DIAGNOSIS — F4310 Post-traumatic stress disorder, unspecified: Secondary | ICD-10-CM

## 2018-04-11 MED ORDER — PAROXETINE HCL 30 MG PO TABS: 30.0000 mg | ORAL_TABLET | Freq: Every day | ORAL | 1 refills | Status: DC
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
## 2018-04-14 ENCOUNTER — Telehealth: Payer: Self-pay

## 2018-04-14 ENCOUNTER — Inpatient Hospital Stay: Payer: Medicare HMO | Attending: Hematology and Oncology

## 2018-04-14 ENCOUNTER — Telehealth: Payer: Self-pay | Admitting: *Deleted

## 2018-04-14 ENCOUNTER — Encounter: Payer: Self-pay | Admitting: Neurology

## 2018-04-14 ENCOUNTER — Inpatient Hospital Stay (HOSPITAL_BASED_OUTPATIENT_CLINIC_OR_DEPARTMENT_OTHER): Payer: Medicare HMO | Admitting: Internal Medicine

## 2018-04-14 ENCOUNTER — Encounter (HOSPITAL_COMMUNITY): Payer: Self-pay | Admitting: Internal Medicine

## 2018-04-14 ENCOUNTER — Other Ambulatory Visit (HOSPITAL_COMMUNITY): Payer: Self-pay | Admitting: Internal Medicine

## 2018-04-14 VITALS — BP 100/67 | HR 79 | Temp 97.7°F | Resp 18 | Ht <= 58 in | Wt 191.3 lb

## 2018-04-14 DIAGNOSIS — D6861 Antiphospholipid syndrome: Secondary | ICD-10-CM | POA: Insufficient documentation

## 2018-04-14 DIAGNOSIS — E559 Vitamin D deficiency, unspecified: Secondary | ICD-10-CM | POA: Diagnosis not present

## 2018-04-14 DIAGNOSIS — K219 Gastro-esophageal reflux disease without esophagitis: Secondary | ICD-10-CM | POA: Insufficient documentation

## 2018-04-14 DIAGNOSIS — D500 Iron deficiency anemia secondary to blood loss (chronic): Secondary | ICD-10-CM

## 2018-04-14 DIAGNOSIS — J45909 Unspecified asthma, uncomplicated: Secondary | ICD-10-CM | POA: Diagnosis not present

## 2018-04-14 DIAGNOSIS — Z86718 Personal history of other venous thrombosis and embolism: Secondary | ICD-10-CM

## 2018-04-14 DIAGNOSIS — F4310 Post-traumatic stress disorder, unspecified: Secondary | ICD-10-CM

## 2018-04-14 DIAGNOSIS — Z86711 Personal history of pulmonary embolism: Secondary | ICD-10-CM | POA: Diagnosis not present

## 2018-04-14 DIAGNOSIS — G8929 Other chronic pain: Secondary | ICD-10-CM

## 2018-04-14 DIAGNOSIS — Z79899 Other long term (current) drug therapy: Secondary | ICD-10-CM

## 2018-04-14 DIAGNOSIS — M25561 Pain in right knee: Secondary | ICD-10-CM

## 2018-04-14 DIAGNOSIS — M810 Age-related osteoporosis without current pathological fracture: Secondary | ICD-10-CM | POA: Diagnosis not present

## 2018-04-14 DIAGNOSIS — M329 Systemic lupus erythematosus, unspecified: Secondary | ICD-10-CM | POA: Diagnosis not present

## 2018-04-14 DIAGNOSIS — R51 Headache: Secondary | ICD-10-CM | POA: Diagnosis not present

## 2018-04-14 DIAGNOSIS — D509 Iron deficiency anemia, unspecified: Secondary | ICD-10-CM | POA: Insufficient documentation

## 2018-04-14 DIAGNOSIS — D508 Other iron deficiency anemias: Secondary | ICD-10-CM

## 2018-04-14 HISTORY — DX: Iron deficiency anemia, unspecified: D50.9

## 2018-04-14 LAB — CBC WITH DIFFERENTIAL (CANCER CENTER ONLY)
Abs Immature Granulocytes: 0.04 10*3/uL (ref 0.00–0.07)
Basophils Absolute: 0 10*3/uL (ref 0.0–0.1)
Basophils Relative: 1 %
Eosinophils Absolute: 0.1 10*3/uL (ref 0.0–0.5)
Eosinophils Relative: 2 %
HCT: 37.9 % (ref 36.0–46.0)
Hemoglobin: 11.4 g/dL — ABNORMAL LOW (ref 12.0–15.0)
Immature Granulocytes: 1 %
Lymphocytes Relative: 43 %
Lymphs Abs: 2.9 10*3/uL (ref 0.7–4.0)
MCH: 21.8 pg — AB (ref 26.0–34.0)
MCHC: 30.1 g/dL (ref 30.0–36.0)
MCV: 72.3 fL — ABNORMAL LOW (ref 80.0–100.0)
MONO ABS: 0.5 10*3/uL (ref 0.1–1.0)
Monocytes Relative: 8 %
Neutro Abs: 3.2 10*3/uL (ref 1.7–7.7)
Neutrophils Relative %: 45 %
Platelet Count: 281 10*3/uL (ref 150–400)
RBC: 5.24 MIL/uL — ABNORMAL HIGH (ref 3.87–5.11)
RDW: 20.4 % — ABNORMAL HIGH (ref 11.5–15.5)
WBC Count: 6.8 10*3/uL (ref 4.0–10.5)
nRBC: 0 % (ref 0.0–0.2)

## 2018-04-14 LAB — COMPREHENSIVE METABOLIC PANEL
ALT: 14 U/L (ref 0–44)
AST: 15 U/L (ref 15–41)
Albumin: 4 g/dL (ref 3.5–5.0)
Alkaline Phosphatase: 72 U/L (ref 38–126)
Anion gap: 9 (ref 5–15)
BUN: 6 mg/dL (ref 6–20)
CO2: 21 mmol/L — ABNORMAL LOW (ref 22–32)
Calcium: 8.9 mg/dL (ref 8.9–10.3)
Chloride: 109 mmol/L (ref 98–111)
Creatinine, Ser: 0.66 mg/dL (ref 0.44–1.00)
GFR calc Af Amer: >60 mL/min (ref >60)
Glucose, Bld: 99 mg/dL (ref 70–99)
POTASSIUM: 3.8 mmol/L (ref 3.5–5.1)
Sodium: 139 mmol/L (ref 135–145)
TOTAL PROTEIN: 7.9 g/dL (ref 6.5–8.1)
Total Bilirubin: 0.5 mg/dL (ref 0.3–1.2)

## 2018-04-14 LAB — FERRITIN: Ferritin: <4 ng/mL — ABNORMAL LOW (ref 11–307)

## 2018-04-14 NOTE — Progress Notes (Signed)
Diagnosis Iron deficiency anemia due to chronic blood loss - Plan: CBC with Differential/Platelet, Comprehensive metabolic panel, Lactate dehydrogenase, Protein electrophoresis, serum, Ferritin, Vitamin B12, Folate, Methylmalonic acid, serum  Chronic nonintractable headache, unspecified headache type - Plan: Ambulatory referral to Neurology  Staging Cancer Staging No matching staging information was found for the patient.  Assessment and Plan: 1.  Pulmonary embolism.  48 year old female previously followed by Dr. Audelia Hives due to history of peripartumpulmonary embolismwhile residing in [place] in 2010 ([facility]);with anticoagulation discontinued after 6 months without recurrence.    In 2005 she presented with a butterfly rash and mouth sores and was treated initially with Plaquenil and prednisone. Because they were affecting her eyes, she subsequently was switched to methotrexate and Plaquenil. She continues on methotrexate to the present.On Jul 08, 2017: A complete blood count showed hemoglobin 11.9 hematocrit 36.8 MCV 38.7 MCH 25.5 RDW 15.7 WBC 6.7 with 63% neutrophils 30% lymphocytes 4% monocytes 2% eosinophils 1% basophil; platelets 302,000. Her hepatitis C antibody was nonreactive. Her hepatitis B surface antigen was nonreactive. Her ANA titer on May 3 was 1: 320, speckled pattern. On Jul 22, 2017 a lupus anticoagulant was identified.The antiphospholipid antibody IgG 46.1 (<15); antiphospholipid antibody IgM 13.4 (<15); antiphospholipid antibody IgA 21(<15);beta-2 glycoprotein 1 antibody IgM19.4 (<15); beta-2 glycoprotein antibody IgG G 50.2 (<15).On Jul 22, 2017 serum iron 31 TIBC 450 iron saturation 7% ferritin 9. Complement C3 143; complement C4 21 it appears also at the time that she had a urinary tract infection. The studies were performed at [facility].  Pt underwent a hypercoagulable evaluation  to assess and/or  identify herrisk for subsequent pulmonary embolism/DVT. By report, a duplex venousdoppler was performed ofherright lower extremity which was reportedly negative. Those records are not available for confirmation.   The antiphospholipid antibody profile was positive. This was the second antiphospholipid antibody profile that was positive since May.  Her factor VIII assay was also elevated.  She has no hemoglobinopathy.  The lupus anticoagulant, and baseline PT and aPTT were normal.  Because the antiphospholipid antibody profile was positive on 2 separate occasions greater than 12 weeks apart, and in light of the fact that she had a pulmonary embolism previously with a known risk factor, systemic lupus erythematosus, Dr. Audelia Hives recommended that she consider long-term anticoagulation with 1 of the newer DOAC such as apixaban. Pt has been started on anticoagulation with her primary care physician, Dr. Shawna Orleans with Apixaban. Her  rheumatologist, Dr. Lyda Perone was also aware of the recommendations.  Pt is a Jehovah's Witness and has concerns about bleeding risks associated with anticoagulation.  She also reports she had evaluation in [place] or follow-up that was reportedly negative and she was taken off anticoagulation in 2012.    I discussed with pt that she has reportedly not had evidence of clot since 2010.  She was reportedly off blood thinner since 2012 and has now resumed therapy based on Dr. Payton Spark recommendations and has been on anticoagulation 2 months.     I have discussed with her their are potential risks of bleeding with any form of anticoagulation.  Risk versus benefit is weighed and based on Dr. Payton Spark evaluation there is concern regarding potential risk for thrombosis based on APL ab profile continuing to be positive.  You would also have to factor in she likely had a provoked clot due to pregnancy and postpartum and has not had recurrent event since that time.  She has been  informed of her  risk of recurrent clot due to prior history and persistent positive labs studies.    Unclear if pt should resume anticoagulation due to the length of time off therapy with no event especially due to her concerns for bleeding risks with anticoagulation.  Also, unclear currently role of DOACs for treatment of APL ab as coumadin usually recommended. Will determine if Dr. Joan Flores at [facility] will review case to determine if continuation of therapy will be recommended.  At the present time will continue Apaxiban.  Pt will be seen for follow-up in 08/2018.  2.  Headaches.  Pt is referred to nephrology for evaluation.  She denies any change in symptoms since starting Apaxiban.    3.  Iron deficiency anemia. Labs done today 04/14/2018 reviewed and showed WBC 6.8 HB 11.4 plts 281,000.  Chemistries WNL with K+ 3.8 Cr 0.66 and normal LFTs.  Ferritin is <4 which is consistent with IDA.  Pt reportedly set up for GI evaluation. She has an intolerance to oral iron.  Pt recommended for Feraheme 510 mg IV D1 and D8.  She will have repeat labs 05/2018 after completion of IV iron.    4.  Lupus.  Pt is followed by  Dr. Lyda Perone of Rheumatology.  Have spoken with her today regarding case.    5.  Health maintenance.  Follow-up with GI as recommended.   25 minutes spent with more than 50% spent in counseling and coordination of care and review of records.    Interval History:  Historical data obtained from note dated 01/18/2018: 48 year old female previously followed by Dr. Audelia Hives due to history of peripartumpulmonary embolismwhile residing in [place] in 2010 ([facility]);with anticoagulation discontinued after 6 months without recurrence.    In 2005 she presented with a butterfly rash and mouth sores and was treated initially with Plaquenil and prednisone. Because they were affecting her eyes, she subsequently was switched to methotrexate and Plaquenil. She continues on methotrexate to the  present.On Jul 08, 2017: A complete blood count showed hemoglobin 11.9 hematocrit 36.8 MCV 38.7 MCH 25.5 RDW 15.7 WBC 6.7 with 63% neutrophils 30% lymphocytes 4% monocytes 2% eosinophils 1% basophil; platelets 302,000. Her hepatitis C antibody was nonreactive. Her hepatitis B surface antigen was nonreactive. Her ANA titer on May 3 was 1: 320, speckled pattern. On Jul 22, 2017 a lupus anticoagulant was identified.The antiphospholipid antibody IgG 46.1 (<15); antiphospholipid antibody IgM 13.4 (<15); antiphospholipid antibody IgA 21(<15);beta-2 glycoprotein 1 antibody IgM19.4 (<15); beta-2 glycoprotein antibody IgG G 50.2 (<15).On Jul 22, 2017 serum iron 31 TIBC 450 iron saturation 7% ferritin 9. Complement C3 143; complement C4 21 it appears also at the time that she had a urinary tract infection. The studies were performed at [facility].  Pt underwent a hypercoagulable evaluation  to assess and/or identify herrisk for subsequent pulmonary embolism/DVT. By report, a duplex venousdoppler was performed ofherright lower extremity which was reportedly negative. Those records are not available for confirmation.   The antiphospholipid antibody profile was positive. This was the second antiphospholipid antibody profile that was positive since May.  Her factor VIII assay was also elevated.  She has no hemoglobinopathy.  The lupus anticoagulant, and baseline PT and aPTT were normal.  Because the antiphospholipid antibody profile was positive on 2 separate occasions greater than 12 weeks apart, and in light of the fact that she had a pulmonary embolism previously with a known risk factor, systemic lupus erythematosus, Dr.  Audelia Hives recommended that she consider long-term anticoagulation with 1 of the newer DOAC such as apixaban. Pt has been started on anticoagulation with her primary care physician, Dr. Shawna Orleans with Apixaban. Her  rheumatologist, Dr. Lyda Perone was also aware of the  recommendations.  Current Status:  Pt is seen today for follow-up to go over labs.  She reports headache that has not changed since beginning [place].  She is here to go over labs.    Problem List Patient Active Problem List   Diagnosis Date Noted  . Hematuria, gross [R31.0] 03/29/2018  . Iron deficiency anemia due to chronic blood loss [D50.0] 03/29/2018  . Long term current use of anticoagulant [Z79.01] 03/29/2018  . Antiphospholipid antibody syndrome ([place]) [D68.61] 01/25/2018  . Severe obesity (BMI >= 40) ([place]) [E66.01] 07/26/2017  . Systemic lupus erythematosus ([place]) [M32.9] 07/08/2017  . Hx pulmonary embolism [Z86.711] 07/08/2017  . Chronic pain of right knee [M25.561, G89.29] 05/23/2017  . Renal mass [N28.89] 01/26/2017  . Speech impediment [R47.9] 12/21/2016  . Vitamin D deficiency [E55.9] 12/16/2016  . Anemia [D64.9]   . Migraine [G43.909]     Past Medical History Past Medical History:  Diagnosis Date  . Anemia   . Anxiety   . Asthma   . Chronic pain   . Congenital hip dislocation   . GERD (gastroesophageal reflux disease)   . Kidney mass 2017  . Migraine   . Osteoporosis   . Pathological dislocation of shoulder joint, bilateral    congential  . PTSD (post-traumatic stress disorder)   . Sleep apnea   . Systemic lupus erythematosus ([place])     Past Surgical History Past Surgical History:  Procedure Laterality Date  . CESAREAN SECTION  2005  . CESAREAN SECTION W/BTL  2010  . HIP SURGERY     in childhood for congenital hip dislocation  . Portal RESECTION  2014  . STAPEDECTOMY  11/01/2011   L postauricular stapedectomy with CO2 laser and insertion of 6 x 4.75 mm SMart Piston  . TOTAL HIP ARTHROPLASTY     05/2000 R, 11/2000 L    Family History Family History  Problem Relation Age of Onset  . Asthma Mother   . Liver cancer Mother   . Heart Problems Mother        heart attack and heart failure  . Kidney Stones Mother   . Osteoporosis  Mother   . Stroke Mother   . Depression Mother   . Anxiety disorder Mother   . Kidney Stones Father   . Hernia Father   . Depression Sister   . Anxiety disorder Sister      Social History  reports that she has never smoked. She has never used smokeless tobacco. She reports previous alcohol use. She reports that she does not use drugs.  Medications  Current Outpatient Medications:  .  apixaban (ELIQUIS) 2.5 MG TABS tablet, Take 1 tablet (2.5 mg total) by mouth 2 (two) times daily., Disp: 60 tablet, Rfl: 2 .  diclofenac sodium (VOLTAREN) 1 % GEL, Apply 2 g topically 4 (four) times daily., Disp: 100 g, Rfl: 0 .  ferrous sulfate 325 (65 FE) MG tablet, Take 325 mg by mouth daily with breakfast., Disp: , Rfl:  .  hydrOXYzine (VISTARIL) 25 MG capsule, Take one to two capsule as needed for sleep, Disp: 60 capsule, Rfl: 0 .  PARoxetine (PAXIL) 30 MG tablet, Take 1 tablet (30 mg total) by mouth daily., Disp: 30 tablet, Rfl: 1 .  topiramate (TOPAMAX) 100 MG tablet, Take 1 tablet (100 mg total) by mouth daily., Disp: 30 tablet, Rfl: 2 .  Vitamin D, Ergocalciferol, (DRISDOL) 50000 units CAPS capsule, Take 1 capsule (50,000 Units total) by mouth every 7 (seven) days., Disp: 8 capsule, Rfl: 0 .  folic acid (FOLVITE) 1 MG tablet, Take 1 tablet by mouth daily., Disp: , Rfl:  .  methotrexate (RHEUMATREX) 2.5 MG tablet, Take 6 tablets by mouth once a week., Disp: , Rfl:   Allergies Fish allergy  Review of Systems Review of Systems - Oncology ROS negative other than headache   Physical Exam  Vitals Wt Readings from Last 3 Encounters:  04/14/18 191 lb 4.8 oz (86.8 kg)  01/25/18 202 lb (91.6 kg)  01/18/18 201 lb 14.4 oz (91.6 kg)   Temp Readings from Last 3 Encounters:  04/14/18 97.7 F (36.5 C) (Oral)  01/25/18 98 F (36.7 C) (Oral)  01/18/18 97.8 F (36.6 C) (Oral)   BP Readings from Last 3 Encounters:  04/14/18 100/67  01/25/18 108/62  01/18/18 120/72   Pulse Readings from Last 3  Encounters:  04/14/18 79  01/25/18 90  01/18/18 84   Constitutional: Well-developed, well-nourished, and in no distress.   HENT: Head: Normocephalic and atraumatic.  Mouth/Throat: No oropharyngeal exudate. Mucosa moist. Eyes: Pupils are equal, round, and reactive to light. Conjunctivae are normal. No scleral icterus.  Neck: Normal range of motion. Neck supple. No JVD present.  Cardiovascular: Normal rate, regular rhythm and normal heart sounds.  Exam reveals no gallop and no friction rub.   No murmur heard. Pulmonary/Chest: Effort normal and breath sounds normal. No respiratory distress. No wheezes.No rales.  Abdominal: Soft. Bowel sounds are normal. No distension. There is no tenderness. There is no guarding.  Musculoskeletal: No edema or tenderness.  Lymphadenopathy: No cervical, axillary or supraclavicular adenopathy.  Neurological: Alert and oriented to person, place, and time. No cranial nerve deficit.  Skin: Skin is warm and dry. No rash noted. No erythema. No pallor.  Psychiatric: Affect and judgment normal.   Labs Appointment on 04/14/2018  Component Date Value Ref Range Status  . Ferritin 04/14/2018 <4* 11 - 307 ng/mL Final   Performed at [facility], [place] [address], [place], [place] [address]  . Sodium 04/14/2018 139  135 - 145 mmol/L Final  . Potassium 04/14/2018 3.8  3.5 - 5.1 mmol/L Final  . Chloride 04/14/2018 109  98 - 111 mmol/L Final  . CO2 04/14/2018 21* 22 - 32 mmol/L Final  . Glucose, Bld 04/14/2018 99  70 - 99 mg/dL Final  . BUN 04/14/2018 6  6 - 20 mg/dL Final  . Creatinine, Ser 04/14/2018 0.66  0.44 - 1.00 mg/dL Final  . Calcium 04/14/2018 8.9  8.9 - 10.3 mg/dL Final  . Total Protein 04/14/2018 7.9  6.5 - 8.1 g/dL Final  . Albumin 04/14/2018 4.0  3.5 - 5.0 g/dL Final  . AST 04/14/2018 15  15 - 41 U/L Final  . ALT 04/14/2018 14  0 - 44 U/L Final  . Alkaline Phosphatase 04/14/2018 72  38 - 126 U/L Final  . Total Bilirubin 04/14/2018  0.5  0.3 - 1.2 mg/dL Final  . GFR calc non Af Amer 04/14/2018 >60  >60 mL/min Final  . GFR calc Af Amer 04/14/2018 >60  >60 mL/min Final  . Anion gap 04/14/2018 9  5 - 15 Final   Performed at [facility], [place] [address] [place], [place] [address]  .  WBC Count 04/14/2018 6.8  4.0 - 10.5 K/uL Final  . RBC 04/14/2018 5.24* 3.87 - 5.11 MIL/uL Final  . Hemoglobin 04/14/2018 11.4* 12.0 - 15.0 g/dL Final  . HCT 04/14/2018 37.9  36.0 - 46.0 % Final  . MCV 04/14/2018 72.3* 80.0 - 100.0 fL Final  . MCH 04/14/2018 21.8* 26.0 - 34.0 pg Final  . MCHC 04/14/2018 30.1  30.0 - 36.0 g/dL Final  . RDW 04/14/2018 20.4* 11.5 - 15.5 % Final  . Platelet Count 04/14/2018 281  150 - 400 K/uL Final  . nRBC 04/14/2018 0.0  0.0 - 0.2 % Final  . Neutrophils Relative % 04/14/2018 45  % Final  . Neutro Abs 04/14/2018 3.2  1.7 - 7.7 K/uL Final  . Lymphocytes Relative 04/14/2018 43  % Final  . Lymphs Abs 04/14/2018 2.9  0.7 - 4.0 K/uL Final  . Monocytes Relative 04/14/2018 8  % Final  . Monocytes Absolute 04/14/2018 0.5  0.1 - 1.0 K/uL Final  . Eosinophils Relative 04/14/2018 2  % Final  . Eosinophils Absolute 04/14/2018 0.1  0.0 - 0.5 K/uL Final  . Basophils Relative 04/14/2018 1  % Final  . Basophils Absolute 04/14/2018 0.0  0.0 - 0.1 K/uL Final  . Immature Granulocytes 04/14/2018 1  % Final  . Abs Immature Granulocytes 04/14/2018 0.04  0.00 - 0.07 K/uL Final   Performed at [facility], [place] [address], [place], [place] [address]     Pathology Orders Placed This Encounter  Procedures  . CBC with Differential/Platelet    Standing Status:   Future    Standing Expiration Date:   04/15/2019  . Comprehensive metabolic panel    Standing Status:   Future    Standing Expiration Date:   04/15/2019  . Lactate dehydrogenase    Standing Status:   Future    Standing Expiration Date:   04/15/2019  . Protein electrophoresis, serum    Standing Status:   Future    Standing  Expiration Date:   04/15/2019  . Ferritin    Standing Status:   Future    Standing Expiration Date:   04/15/2019  . Vitamin B12    Standing Status:   Future    Standing Expiration Date:   04/15/2019  . Folate    Standing Status:   Future    Standing Expiration Date:   04/15/2019  . Methylmalonic acid, serum    Standing Status:   Future    Standing Expiration Date:   04/15/2019  . Ambulatory referral to Neurology    Referral Priority:   Routine    Referral Type:   Consultation    Referral Reason:   Specialty Services Required    Requested Specialty:   Neurology    Number of Visits Requested:   1       Zoila Shutter MD

## 2018-04-14 NOTE — Telephone Encounter (Signed)
Printed avs and calender of upcoming appointment. Per 2/7 los 

## 2018-04-14 NOTE — Telephone Encounter (Signed)
TCT patient regarding lab results.  Spoke with patient.  Informed her that her lab work from today revealed low iron levels. Dr. Walden Field is ordering IV iron x 2 doses, a week apart, follow up labs will be in March. Pt also instructed to stay on her blood thinner for now and to make a follow up appt with her GI doctor. Pt voiced understanding. No further questions or concerns.

## 2018-04-18 ENCOUNTER — Telehealth: Payer: Self-pay | Admitting: Internal Medicine

## 2018-04-18 NOTE — Telephone Encounter (Signed)
Scheduled appt per 2/7 sch message - pt is aware of appt date and time

## 2018-04-19 ENCOUNTER — Encounter (HOSPITAL_COMMUNITY): Payer: Self-pay | Admitting: Psychiatry

## 2018-04-19 ENCOUNTER — Ambulatory Visit (INDEPENDENT_AMBULATORY_CARE_PROVIDER_SITE_OTHER): Payer: Medicare HMO | Admitting: Psychiatry

## 2018-04-19 DIAGNOSIS — F4310 Post-traumatic stress disorder, unspecified: Secondary | ICD-10-CM | POA: Diagnosis not present

## 2018-04-19 DIAGNOSIS — F331 Major depressive disorder, recurrent, moderate: Secondary | ICD-10-CM | POA: Diagnosis not present
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
## 2018-04-27 ENCOUNTER — Ambulatory Visit (INDEPENDENT_AMBULATORY_CARE_PROVIDER_SITE_OTHER): Payer: Medicare HMO | Admitting: Psychiatry

## 2018-04-27 ENCOUNTER — Encounter (HOSPITAL_COMMUNITY): Payer: Self-pay | Admitting: Psychiatry

## 2018-04-27 DIAGNOSIS — F4310 Post-traumatic stress disorder, unspecified: Secondary | ICD-10-CM

## 2018-04-27 DIAGNOSIS — F331 Major depressive disorder, recurrent, moderate: Secondary | ICD-10-CM | POA: Diagnosis not present

## 2018-04-28 ENCOUNTER — Inpatient Hospital Stay: Payer: Medicare HMO

## 2018-04-28 VITALS — BP 96/59 | HR 88 | Temp 98.4°F | Resp 16 | Ht <= 58 in | Wt 189.2 lb

## 2018-04-28 DIAGNOSIS — M329 Systemic lupus erythematosus, unspecified: Secondary | ICD-10-CM | POA: Diagnosis not present

## 2018-04-28 DIAGNOSIS — Z86718 Personal history of other venous thrombosis and embolism: Secondary | ICD-10-CM | POA: Diagnosis not present

## 2018-04-28 DIAGNOSIS — M25561 Pain in right knee: Secondary | ICD-10-CM | POA: Diagnosis not present

## 2018-04-28 DIAGNOSIS — D500 Iron deficiency anemia secondary to blood loss (chronic): Secondary | ICD-10-CM | POA: Diagnosis not present

## 2018-04-28 DIAGNOSIS — Z86711 Personal history of pulmonary embolism: Secondary | ICD-10-CM | POA: Diagnosis not present

## 2018-04-28 DIAGNOSIS — Z79899 Other long term (current) drug therapy: Secondary | ICD-10-CM | POA: Diagnosis not present

## 2018-04-28 DIAGNOSIS — G8929 Other chronic pain: Secondary | ICD-10-CM | POA: Diagnosis not present

## 2018-04-28 DIAGNOSIS — R51 Headache: Secondary | ICD-10-CM | POA: Diagnosis not present

## 2018-04-28 DIAGNOSIS — D508 Other iron deficiency anemias: Secondary | ICD-10-CM

## 2018-04-28 DIAGNOSIS — D6861 Antiphospholipid syndrome: Secondary | ICD-10-CM | POA: Diagnosis not present

## 2018-04-28 MED ORDER — SODIUM CHLORIDE 0.9 % IV SOLN
Freq: Once | INTRAVENOUS | Status: AC
  Administered 2018-04-28: 09:00:00 via INTRAVENOUS
  Filled 2018-04-28: qty 250

## 2018-04-28 MED ORDER — SODIUM CHLORIDE 0.9 % IV SOLN
510.0000 mg | Freq: Once | INTRAVENOUS | Status: AC
  Administered 2018-04-28: 510 mg via INTRAVENOUS
  Filled 2018-04-28: qty 17

## 2018-04-28 NOTE — Patient Instructions (Signed)
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
## 2018-05-01 DIAGNOSIS — Z86711 Personal history of pulmonary embolism: Secondary | ICD-10-CM | POA: Diagnosis not present

## 2018-05-01 DIAGNOSIS — M329 Systemic lupus erythematosus, unspecified: Secondary | ICD-10-CM | POA: Diagnosis not present

## 2018-05-01 DIAGNOSIS — R760 Raised antibody titer: Secondary | ICD-10-CM | POA: Diagnosis not present

## 2018-05-04 ENCOUNTER — Ambulatory Visit (INDEPENDENT_AMBULATORY_CARE_PROVIDER_SITE_OTHER): Payer: Medicare HMO | Admitting: Psychiatry

## 2018-05-04 DIAGNOSIS — F331 Major depressive disorder, recurrent, moderate: Secondary | ICD-10-CM | POA: Diagnosis not present

## 2018-05-04 DIAGNOSIS — F4310 Post-traumatic stress disorder, unspecified: Secondary | ICD-10-CM | POA: Diagnosis not present

## 2018-05-05 ENCOUNTER — Inpatient Hospital Stay: Payer: Medicare HMO

## 2018-05-05 VITALS — BP 136/72 | HR 87 | Temp 97.7°F | Resp 18

## 2018-05-05 DIAGNOSIS — Z79899 Other long term (current) drug therapy: Secondary | ICD-10-CM | POA: Diagnosis not present

## 2018-05-05 DIAGNOSIS — D500 Iron deficiency anemia secondary to blood loss (chronic): Secondary | ICD-10-CM | POA: Diagnosis not present

## 2018-05-05 DIAGNOSIS — Z86711 Personal history of pulmonary embolism: Secondary | ICD-10-CM | POA: Diagnosis not present

## 2018-05-05 DIAGNOSIS — D508 Other iron deficiency anemias: Secondary | ICD-10-CM

## 2018-05-05 DIAGNOSIS — D6861 Antiphospholipid syndrome: Secondary | ICD-10-CM | POA: Diagnosis not present

## 2018-05-05 DIAGNOSIS — Z86718 Personal history of other venous thrombosis and embolism: Secondary | ICD-10-CM | POA: Diagnosis not present

## 2018-05-05 DIAGNOSIS — G8929 Other chronic pain: Secondary | ICD-10-CM | POA: Diagnosis not present

## 2018-05-05 DIAGNOSIS — M25561 Pain in right knee: Secondary | ICD-10-CM | POA: Diagnosis not present

## 2018-05-05 DIAGNOSIS — R51 Headache: Secondary | ICD-10-CM | POA: Diagnosis not present

## 2018-05-05 DIAGNOSIS — M329 Systemic lupus erythematosus, unspecified: Secondary | ICD-10-CM | POA: Diagnosis not present

## 2018-05-05 MED ORDER — SODIUM CHLORIDE 0.9 % IV SOLN
510.0000 mg | Freq: Once | INTRAVENOUS | Status: AC
  Administered 2018-05-05: 510 mg via INTRAVENOUS
  Filled 2018-05-05: qty 17

## 2018-05-05 MED ORDER — SODIUM CHLORIDE 0.9 % IV SOLN
Freq: Once | INTRAVENOUS | Status: AC
  Administered 2018-05-05: 09:00:00 via INTRAVENOUS
  Filled 2018-05-05: qty 250

## 2018-05-05 NOTE — Patient Instructions (Signed)

## 2018-05-07 ENCOUNTER — Encounter (HOSPITAL_COMMUNITY): Payer: Self-pay | Admitting: Psychiatry
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
## 2018-05-11 ENCOUNTER — Encounter (HOSPITAL_COMMUNITY): Payer: Self-pay | Admitting: Psychiatry

## 2018-05-11 ENCOUNTER — Ambulatory Visit (INDEPENDENT_AMBULATORY_CARE_PROVIDER_SITE_OTHER): Payer: Medicare Other | Admitting: Psychiatry

## 2018-05-11 DIAGNOSIS — F331 Major depressive disorder, recurrent, moderate: Secondary | ICD-10-CM

## 2018-05-11 DIAGNOSIS — F4310 Post-traumatic stress disorder, unspecified: Secondary | ICD-10-CM
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
## 2018-05-15 ENCOUNTER — Ambulatory Visit (INDEPENDENT_AMBULATORY_CARE_PROVIDER_SITE_OTHER): Payer: Medicare Other | Admitting: Psychiatry

## 2018-05-15 DIAGNOSIS — F331 Major depressive disorder, recurrent, moderate: Secondary | ICD-10-CM | POA: Diagnosis not present

## 2018-05-15 DIAGNOSIS — F4310 Post-traumatic stress disorder, unspecified: Secondary | ICD-10-CM

## 2018-05-15 MED ORDER — PAROXETINE HCL 40 MG PO TABS: 40.0000 mg | ORAL_TABLET | Freq: Every day | ORAL | 2 refills | Status: DC

## 2018-05-15 MED ORDER — HYDROXYZINE PAMOATE 25 MG PO CAPS: 25.0000 mg | ORAL_CAPSULE | Freq: Every day | ORAL | 0 refills | Status: DC

## 2018-05-15 MED ORDER — HYDROXYZINE PAMOATE 25 MG PO CAPS: 25.0000 mg | ORAL_CAPSULE | Freq: Every day | ORAL | 2 refills | Status: DC

## 2018-05-15 MED ORDER — PAROXETINE HCL 30 MG PO TABS: 30.0000 mg | ORAL_TABLET | Freq: Every day | ORAL | 2 refills | Status: DC

## 2018-05-15 MED ORDER — PAROXETINE HCL 40 MG PO TABS: 40.0000 mg | ORAL_TABLET | Freq: Every day | ORAL | 0 refills | Status: DC
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
## 2018-05-18 ENCOUNTER — Encounter (HOSPITAL_COMMUNITY): Payer: Self-pay | Admitting: Psychiatry

## 2018-05-18 ENCOUNTER — Ambulatory Visit (INDEPENDENT_AMBULATORY_CARE_PROVIDER_SITE_OTHER): Payer: Medicare Other | Admitting: Psychiatry

## 2018-05-18 ENCOUNTER — Other Ambulatory Visit: Payer: Self-pay

## 2018-05-18 DIAGNOSIS — F4310 Post-traumatic stress disorder, unspecified: Secondary | ICD-10-CM

## 2018-05-18 DIAGNOSIS — F331 Major depressive disorder, recurrent, moderate: Secondary | ICD-10-CM | POA: Diagnosis not present
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
## 2018-05-25 ENCOUNTER — Encounter (HOSPITAL_COMMUNITY): Payer: Self-pay | Admitting: Psychiatry

## 2018-05-25 ENCOUNTER — Ambulatory Visit (INDEPENDENT_AMBULATORY_CARE_PROVIDER_SITE_OTHER): Payer: Medicare Other | Admitting: Psychiatry

## 2018-05-25 ENCOUNTER — Other Ambulatory Visit: Payer: Self-pay

## 2018-05-25 DIAGNOSIS — F4310 Post-traumatic stress disorder, unspecified: Secondary | ICD-10-CM | POA: Diagnosis not present

## 2018-05-25 DIAGNOSIS — F331 Major depressive disorder, recurrent, moderate: Secondary | ICD-10-CM | POA: Diagnosis not present
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
## 2018-05-26 ENCOUNTER — Inpatient Hospital Stay: Payer: Medicare Other | Attending: Hematology and Oncology

## 2018-05-26 ENCOUNTER — Other Ambulatory Visit: Payer: Self-pay

## 2018-05-26 DIAGNOSIS — D500 Iron deficiency anemia secondary to blood loss (chronic): Secondary | ICD-10-CM | POA: Insufficient documentation

## 2018-05-26 LAB — CBC WITH DIFFERENTIAL/PLATELET
Abs Immature Granulocytes: 0.01 10*3/uL (ref 0.00–0.07)
BASOS PCT: 1 %
Basophils Absolute: 0 10*3/uL (ref 0.0–0.1)
Eosinophils Absolute: 0.1 10*3/uL (ref 0.0–0.5)
Eosinophils Relative: 2 %
HCT: 40.7 % (ref 36.0–46.0)
Hemoglobin: 12.8 g/dL (ref 12.0–15.0)
Immature Granulocytes: 0 %
Lymphocytes Relative: 45 %
Lymphs Abs: 2.3 10*3/uL (ref 0.7–4.0)
MCH: 25.1 pg — ABNORMAL LOW (ref 26.0–34.0)
MCHC: 31.4 g/dL (ref 30.0–36.0)
MCV: 80 fL (ref 80.0–100.0)
Monocytes Absolute: 0.3 10*3/uL (ref 0.1–1.0)
Monocytes Relative: 7 %
Neutro Abs: 2.3 10*3/uL (ref 1.7–7.7)
Neutrophils Relative %: 45 %
Platelets: 277 10*3/uL (ref 150–400)
RBC: 5.09 MIL/uL (ref 3.87–5.11)
RDW: 27.9 % — ABNORMAL HIGH (ref 11.5–15.5)
WBC: 5.1 10*3/uL (ref 4.0–10.5)
nRBC: 0 % (ref 0.0–0.2)

## 2018-05-26 LAB — COMPREHENSIVE METABOLIC PANEL
ALT: 10 U/L (ref 0–44)
AST: 10 U/L — ABNORMAL LOW (ref 15–41)
Albumin: 3.8 g/dL (ref 3.5–5.0)
Alkaline Phosphatase: 67 U/L (ref 38–126)
Anion gap: 10 (ref 5–15)
BUN: 8 mg/dL (ref 6–20)
CO2: 20 mmol/L — ABNORMAL LOW (ref 22–32)
Calcium: 8.4 mg/dL — ABNORMAL LOW (ref 8.9–10.3)
Chloride: 110 mmol/L (ref 98–111)
Creatinine, Ser: 0.64 mg/dL (ref 0.44–1.00)
GFR calc Af Amer: >60 mL/min (ref >60)
GFR calc non Af Amer: >60 mL/min (ref >60)
Glucose, Bld: 91 mg/dL (ref 70–99)
Potassium: 3.9 mmol/L (ref 3.5–5.1)
Sodium: 140 mmol/L (ref 135–145)
TOTAL PROTEIN: 7.5 g/dL (ref 6.5–8.1)
Total Bilirubin: 0.2 mg/dL — ABNORMAL LOW (ref 0.3–1.2)

## 2018-05-26 LAB — FERRITIN: Ferritin: 127 ng/mL (ref 11–307)

## 2018-05-26 LAB — LACTATE DEHYDROGENASE: LDH: 133 U/L (ref 98–192)

## 2018-06-01 ENCOUNTER — Ambulatory Visit (INDEPENDENT_AMBULATORY_CARE_PROVIDER_SITE_OTHER): Payer: Medicare Other | Admitting: Psychiatry

## 2018-06-01 ENCOUNTER — Encounter (HOSPITAL_COMMUNITY): Payer: Self-pay | Admitting: Psychiatry

## 2018-06-01 ENCOUNTER — Other Ambulatory Visit: Payer: Self-pay

## 2018-06-01 DIAGNOSIS — F331 Major depressive disorder, recurrent, moderate: Secondary | ICD-10-CM | POA: Diagnosis not present

## 2018-06-01 DIAGNOSIS — F4310 Post-traumatic stress disorder, unspecified: Secondary | ICD-10-CM
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
## 2018-06-08 ENCOUNTER — Ambulatory Visit (HOSPITAL_COMMUNITY): Payer: Medicare Other | Admitting: Psychiatry

## 2018-06-12 ENCOUNTER — Telehealth (INDEPENDENT_AMBULATORY_CARE_PROVIDER_SITE_OTHER): Payer: Medicare Other | Admitting: Family Medicine

## 2018-06-12 ENCOUNTER — Other Ambulatory Visit: Payer: Self-pay

## 2018-06-12 DIAGNOSIS — G43809 Other migraine, not intractable, without status migrainosus: Secondary | ICD-10-CM | POA: Diagnosis not present

## 2018-06-12 MED ORDER — SUMATRIPTAN SUCCINATE 100 MG PO TABS: 50.0000 mg | ORAL_TABLET | Freq: Once | ORAL | 0 refills | Status: DC

## 2018-06-12 NOTE — Assessment & Plan Note (Addendum)
Per patient, this is similar to typical migraine headache. No concerning symptoms present at this time. Dizziness is also chronic for patient. Could be vertigo as it seems to be exacerbated with movement. Patient without a car and unable to afford medications at this time. Recommended migraine cocktail with tylenol, Aspirin, and caffeine. Spent time going through meds at home and instructions on how to make and take cocktail (one 500mg  Tylenol, two 325mg  Aspirin, and a cup of coffee). Will also send in a dose of Sumatriptan 50mg  x 1 with repeat dose after 2 hours if no relief. Return precautions discussed at length.  Given patient is on Eliquis chronically, reassured her that acute use of Aspirin is safe but return precautions discussed and to seek medical care if she experiences any form of trauma.  Patient understood and agreed to plan.

## 2018-06-12 NOTE — Progress Notes (Signed)
[place] Telemedicine Visit  Patient consented to have visit conducted via telephone.  Encounter participants: Patient: Melissa Gaines  Provider: Danna Hefty  Others (if applicable): None  Chief Complaint: "Migraines"  HPI: Pain along temples bilaterally, "feels like electric shock" x 1 week, constant, noises and lights make the pain worse. This is similar to her chronic headaches. Complete darkness and silence makes the pain better. Notes episodes of room spinning with sudden changes in movement, self resolves when laying down. Has been taking topamax without improvement. Denies any worsening stress at this time. Denies vision changes, lightheadedness, nausea, vomiting. Tylenol and ibuprofen are not helping.  ROS: See HPI  Pertinent PMHx: Chronic migraines, dizziness, tension headache  Exam:  Gen: no acute distress, pleasant Respiratory: talking in full sentences, no audible wheezes  Assessment/Plan:  Migraine Per patient, this is similar to typical migraine headache. No concerning symptoms present at this time. Dizziness is also chronic for patient. Could be vertigo as it seems to be exacerbated with movement. Patient without a car and unable to afford medications at this time. Recommended migraine cocktail with tylenol, Aspirin, and caffeine. Spent time going through meds at home and instructions on how to make and take cocktail (one 500mg  Tylenol, two 325mg  Aspirin, and a cup of coffee). Will also send in a dose of Sumatriptan 50mg  x 1 with repeat dose after 2 hours if no relief. Return precautions discussed at length.  Given patient is on Eliquis chronically, reassured her that acute use of Aspirin is safe but return precautions discussed and to seek medical care if she experiences any form of trauma.  Patient understood and agreed to plan.    Time spent on phone with patient: 25 minutes  Mina Marble, [place],  PGY1 06/12/2018

## 2018-06-15 ENCOUNTER — Other Ambulatory Visit: Payer: Self-pay

## 2018-06-15 ENCOUNTER — Ambulatory Visit (INDEPENDENT_AMBULATORY_CARE_PROVIDER_SITE_OTHER): Payer: Medicare Other | Admitting: Psychiatry

## 2018-06-15 ENCOUNTER — Encounter (HOSPITAL_COMMUNITY): Payer: Self-pay | Admitting: Psychiatry

## 2018-06-15 DIAGNOSIS — F331 Major depressive disorder, recurrent, moderate: Secondary | ICD-10-CM | POA: Diagnosis not present

## 2018-06-15 DIAGNOSIS — F4310 Post-traumatic stress disorder, unspecified: Secondary | ICD-10-CM
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
## 2018-06-22 ENCOUNTER — Ambulatory Visit (HOSPITAL_COMMUNITY): Payer: Medicare Other | Admitting: Psychiatry

## 2018-06-27 NOTE — Progress Notes (Signed)
Virtual Visit via Video Note The purpose of this virtual visit is to provide medical care while limiting exposure to the novel coronavirus.    Consent was obtained for video visit:  Yes.   Answered questions that patient had about telehealth interaction:  Yes.   I discussed the limitations, risks, security and privacy concerns of performing an evaluation and management service by telemedicine. I also discussed with the patient that there may be a patient responsible charge related to this service. The patient expressed understanding and agreed to proceed.  Pt location: Home Physician Location: office Name of referring provider:  Higgs, Melissa Dad, MD I connected with Melissa Gaines at patients initiation/request on 06/28/2018 at  9:10 AM EDT by video enabled telemedicine application and verified that I am speaking with the correct person using two identifiers. Pt MRN:  [id number] Pt DOB:  29-Nov-1970 Video Participants:  Melissa Gaines   History of Present Illness:  Melissa Gaines is a 48 year old woman with PTSD, anxiety, chronic dizziness, migraines, SLE and antiphospholipid antibody syndrome with history of PE on chronic anticoagulation who presents for migraines.  History supplemented by family medicine notes.  Onset:  Mild migraines in 1990s but worse since birth of son in 2010 Location:  Left temple radiating down to back of head Quality:  pounding Intensity:  Severe.  She denies new headache, thunderclap headache  Aura:  no Premonitory Phase:  no Postdrome:  no Associated symptoms:  Photophobia, phonophobia, dizziness, stuttering speech. If severe, she has memory deficits such as difficulty spelling her name or remembering her social security number.  She denies associated nausea, vomiting, visual disturbance, unilateral numbness or weakness. Duration:  Varies.  1 hour to several hours.  Last month, she had one that lasted 5 days. Frequency:  Daily Frequency  of abortive medication: ASA and Tylenol daily Triggers:  unknown Relieving factors:  Resting in dark quiet room, cold packs Activity:  aggravates  Rescue therapy:  Extra-Strength Tylenol with 2 ASA 325mg  and sometimes cup of coffee (makes her jittery) Current NSAIDS:  ASA 325mg  Current analgesics:  Extra-strength Tylenol Current triptans:  none Current ergotamine:  none Current anti-emetic:  none Current muscle relaxants:  none Current anti-anxiolytic:  hydroxyzine Current sleep aide:  none Current Antihypertensive medications:  none Current Antidepressant medications:  Paxil 40mg  Current Anticonvulsant medications:  topiramate 100mg  (but she ran out) Current anti-CGRP:  none Current Vitamins/Herbal/Supplements:  D, folic acid Current Antihistamines/Decongestants:  none Other therapy:  Icy-cold  Hormone/birth control:  none Other medications:  Eliquis, methotrexate  Past NSAIDS:  ibuprofen Past analgesics:  none Past abortive triptans:  none Past abortive ergotamine:  none Past muscle relaxants:  none Past anti-emetic:  none Past antihypertensive medications:  none Past antidepressant medications:  none Past anticonvulsant medications:  none Past anti-CGRP:  none Past vitamins/Herbal/Supplements:  none Past antihistamines/decongestants:  none Other past therapies:  none  Caffeine:  No coffee.  Pepsi daily. Alcohol:  no Smoker:  no Diet:  4 glasses of water daily.  Skips meals Exercise:  no Depression:  yes; Anxiety:  yes Other pain:  Joint pain, back pain, lumbar radicular pain Sleep hygiene:  varies Family history of headache:  Mom  05/26/18 LABS:  CBC with WBC 5.1, HGB 12.8, HCT 40.7, PLT 277; CMP with Na 140, K 3.9, Cl 110, CO2 20, glucose 91, BUN 8, Cr 0.64, t bili 0.2, ALP 67, AST 10, ALT 10.  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  However, she followed up with another neurologist in [place] who performed a  repeat brain MRI and was told that it was gone.  Past Medical History: Past Medical History:  Diagnosis Date  . Anemia   . Anxiety   . Asthma   . Chronic pain   . Congenital hip dislocation   . GERD (gastroesophageal reflux disease)   . Iron deficiency anemia 04/14/2018  . Kidney mass 2017  . Migraine   . Osteoporosis   . Pathological dislocation of shoulder joint, bilateral    congential  . PTSD (post-traumatic stress disorder)   . Sleep apnea   . Systemic lupus erythematosus (HCC)     Medications: Outpatient Encounter Medications as of 06/28/2018  Medication Sig  . apixaban (ELIQUIS) 2.5 MG TABS tablet Take 1 tablet (2.5 mg total) by mouth 2 (two) times daily.  . diclofenac sodium (VOLTAREN) 1 % GEL Apply 2 g topically 4 (four) times daily.  . ferrous sulfate 325 (65 FE) MG tablet Take 325 mg by mouth daily with breakfast.  . folic acid (FOLVITE) 1 MG tablet Take 1 tablet by mouth daily.  . hydrOXYzine (VISTARIL) 25 MG capsule Take 1 capsule (25 mg total) by mouth at bedtime.  . methotrexate (RHEUMATREX) 2.5 MG tablet Take 6 tablets by mouth once a week.  [name] Kitchen PARoxetine (PAXIL) 40 MG tablet Take 1 tablet (40 mg total) by mouth daily.  . SUMAtriptan (IMITREX) 100 MG tablet Take 0.5 tablets (50 mg total) by mouth once for 1 dose. May repeat in 2 hours if headache persists or recurs.  . topiramate (TOPAMAX) 100 MG tablet Take 1 tablet (100 mg total) by mouth daily.  . Vitamin D, Ergocalciferol, (DRISDOL) 50000 units CAPS capsule Take 1 capsule (50,000 Units total) by mouth every 7 (seven) days.   No facility-administered encounter medications on file as of 06/28/2018.     Allergies: Allergies  Allergen Reactions  . 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 Throat closes and short of breath.    Family History: Family History  Problem Relation Age of Onset  . Asthma Mother   . Liver cancer Mother   . Heart Problems Mother        heart attack and heart failure  .  Kidney Stones Mother   . Osteoporosis Mother   . Stroke Mother   . Depression Mother   . Anxiety disorder Mother   . Kidney Stones Father   . Hernia Father   . Depression Sister   . Anxiety disorder Sister     Social History: Social History   Socioeconomic History  . Marital status: Single    Spouse name: Not on file  . Number of children: 2  . Years of education: some high school  . Highest education level: 11th grade  Occupational History  . Occupation: disabled  Social Needs  . Financial resource strain: Very hard  . Food insecurity:    Worry: Often true    Inability: Often true  . Transportation needs:    Medical: Yes    Non-medical: Yes  Tobacco Use  . Smoking status: Never Smoker  . Smokeless tobacco: Never Used  Substance and Sexual Activity  . Alcohol use: Not Currently    Comment: when she was 48 years old  . Drug use: No  . Sexual activity: Yes    Partners: Male    Birth control/protection: Condom  Lifestyle  .  Physical activity:    Days per week: 0 days    Minutes per session: 0 min  . Stress: To some extent  Relationships  . Social connections:    Talks on phone: Never    Gets together: Never    Attends religious service: Never    Active member of club or organization: No    Attends meetings of clubs or organizations: Never    Relationship status: Never married  . Intimate partner violence:    Fear of current or ex partner: No    Emotionally abused: No    Physically abused: No    Forced sexual activity: No  Other Topics Concern  . Not on file  Social History Narrative   Lives with her 2 children, dog, fish, hamsters.    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    The person she would like to make her medical decisions for her is Bobbye Morton, a foster brother.   She likes to spend time with her children and write poems.   Observations/Objective:   Height 4\' 10"  (1.473 m), weight 190 lb (86.2 kg). No acute  distress.  Alert and oriented.  Speech fluent and not dysarthric.  Language intact.  Eyes orthophoric and move in all directions.  Face symmetric.  Assessment and Plan:   Chronic migraine without aura, without status migrainosus, not intractable  1.  For preventative management, Aimovig 70mg  monthly.  She will remain off topiramate as it was ineffective. 2.  For abortive therapy, rizatriptan 10mg .  While she has antiphospholipid antibody syndrome which may increase risk of stroke, she is currently anticoagulated and does not have known cerebrovascular disease. 3.  She will stop ASA and Tylenol use.  She will use only rizatriptan for abortive therapy.  Limit use of pain relievers to no more than 2 days out of week to prevent risk of rebound or medication-overuse headache. 4.  Keep headache diary 5.  Exercise, hydration, caffeine cessation, sleep hygiene, monitor for and avoid triggers 6.  Consider:  magnesium citrate 400mg  daily, riboflavin 400mg  daily, and coenzyme Q10 100mg  three times daily 7.  Follow up in 4 months.  Follow Up Instructions:    -I discussed the assessment and treatment plan with the patient. The patient was provided an opportunity to ask questions and all were answered. The patient agreed with the plan and demonstrated an understanding of the instructions.   The patient was advised to call back or seek an in-person evaluation if the symptoms worsen or if the condition fails to improve as anticipated.   Dudley Major, DO

## 2018-06-28 ENCOUNTER — Telehealth (INDEPENDENT_AMBULATORY_CARE_PROVIDER_SITE_OTHER): Payer: Medicare Other | Admitting: Neurology

## 2018-06-28 ENCOUNTER — Other Ambulatory Visit: Payer: Self-pay

## 2018-06-28 ENCOUNTER — Encounter: Payer: Self-pay | Admitting: Neurology

## 2018-06-28 VITALS — Ht <= 58 in | Wt 190.0 lb

## 2018-06-28 DIAGNOSIS — G43709 Chronic migraine without aura, not intractable, without status migrainosus: Secondary | ICD-10-CM | POA: Diagnosis not present

## 2018-06-28 MED ORDER — ERENUMAB-AOOE 70 MG/ML ~~LOC~~ SOAJ: 70.0000 mg | SUBCUTANEOUS | 3 refills | Status: DC

## 2018-06-28 MED ORDER — RIZATRIPTAN BENZOATE 10 MG PO TABS: ORAL_TABLET | ORAL | 3 refills | Status: DC

## 2018-06-28 NOTE — Patient Instructions (Signed)
1.  Start Aimovig 70mg  injection monthly (every 30 days).  We will not continue topiramate. 2.  at earliest onset of migraine, take rizatriptan 10mg .  May repeat dose after 2 hours if needed (maximum 2 tablets in 24 hours). 3.  Stop ASA and Tylenol and caffeine (Pepsi) use.  She will use only rizatriptan for abortive therapy.  Limit use of pain relievers to no more than 2 days out of week to prevent risk of rebound or medication-overuse headache. 4.  Keep headache diary 5.  Exercise, hydration, caffeine cessation, sleep hygiene, monitor for and avoid triggers 6.  Consider:  magnesium citrate 400mg  daily, riboflavin 400mg  daily, and coenzyme Q10 100mg  three times daily 7.  Follow up in 4 months.

## 2018-06-29 ENCOUNTER — Ambulatory Visit (INDEPENDENT_AMBULATORY_CARE_PROVIDER_SITE_OTHER): Payer: Medicare Other | Admitting: Psychiatry

## 2018-06-29 ENCOUNTER — Encounter (HOSPITAL_COMMUNITY): Payer: Self-pay | Admitting: Psychiatry

## 2018-06-29 DIAGNOSIS — F331 Major depressive disorder, recurrent, moderate: Secondary | ICD-10-CM | POA: Diagnosis not present

## 2018-06-29 DIAGNOSIS — F4310 Post-traumatic stress disorder, unspecified: Secondary | ICD-10-CM | POA: Diagnosis not present

## 2018-06-29 NOTE — Progress Notes (Signed)
Virtual Visit via Video Note  I connected with Jeny Nield on 06/29/18 at  1:30 PM EDT by a video enabled telemedicine application and verified that I am speaking with the correct person using two identifiers.   I discussed the limitations of evaluation and management by telemedicine and the availability of in person appointments. The patient expressed understanding and agreed to proceed.  History of Present Illness: PTSD and MDD due to adverse life events, parenting challenges, and relational conflict.    Observations/Objective: Counselor met with [name] for individual therapy via Webex. Counselor assessed for mental health concerns. [name] looked happy and content, however she reported that her kids are "driving her crazy" and that she is having a difficult time getting them to follow any rules or structure. She reports she is overwhelmed with their school work, because she doesn't even understand it and they stay up all night and miss classes. Counselor discussed strategies to put in place to address these issues. [name] reported having little motivation or interest in changing things until the next school year. We discussed the impact of that choice on her health and mental health, as well as how it would impact the kids as well. Counselor encouraged [name] to be in touch with the school and their therapist to find alternative solutions.    Assessment and Plan: Counselor will continue to meet with [name] to address treatment plan goals. [name] will attempt to implement boundaries, structure, effective communication and self-care.   Follow Up Instructions: Counselor will set up additional session and send Webex link before next visit.    I discussed the assessment and treatment plan with the patient. The patient was provided an opportunity to ask questions and all were answered. The patient agreed with the plan and demonstrated an understanding of the instructions.   The patient was advised to call  back or seek an in-person evaluation if the symptoms worsen or if the condition fails to improve as anticipated.  I provided 33 minutes of non-face-to-face time during this encounter.   Lise Auer, LCSW

## 2018-07-06 ENCOUNTER — Ambulatory Visit (HOSPITAL_COMMUNITY): Payer: Medicare Other | Admitting: Psychiatry

## 2018-07-14 ENCOUNTER — Other Ambulatory Visit: Payer: Self-pay

## 2018-07-14 ENCOUNTER — Ambulatory Visit (INDEPENDENT_AMBULATORY_CARE_PROVIDER_SITE_OTHER): Payer: Medicare Other | Admitting: Psychiatry

## 2018-07-14 ENCOUNTER — Encounter (HOSPITAL_COMMUNITY): Payer: Self-pay | Admitting: Psychiatry

## 2018-07-14 DIAGNOSIS — F4310 Post-traumatic stress disorder, unspecified: Secondary | ICD-10-CM

## 2018-07-14 DIAGNOSIS — F331 Major depressive disorder, recurrent, moderate: Secondary | ICD-10-CM | POA: Diagnosis not present

## 2018-07-14 NOTE — Progress Notes (Signed)
Virtual Visit via Video Note  I connected with Melissa Gaines on 07/14/18 at  9:30 AM EDT by a video enabled telemedicine application and verified that I am speaking with the correct person using two identifiers.  Location: Patient: Melissa Gaines Provider: Lise Auer, LCSCW   I discussed the limitations of evaluation and management by telemedicine and the availability of in person appointments. The patient expressed understanding and agreed to proceed.  History of Present Illness: MDD and PTSD due to adverse life experiences, parenting issues, financial issues, and complex medical conditions.    Observations/Objective: Counselor met with Melissa Gaines for individual therapy via Webex. Counselor assessed MH symptoms and progress on treatment plan goals. Melissa Gaines shared that things have been about the same for her, no extreme depression or anxiety, just frustration with her children for not focusing on school, respecting her or following rules/expectations. Counselor and Melissa Gaines explored solutions to making her parenting issues and home schooling more manageable. Counselor assessed transposrtation, financial and physical health concerns. Counselor and Melissa Gaines explored communication and boundary issues within new relationships. Counselor praised Materials engineer for putting skills to action. Melissa Gaines denied suicidal ideation or self-harm.  Assessment and Plan: Counselor will continue to meet with Melissa Gaines to address treatment plan goals. [name] will continue to follow recommendations of providers and implement skills learned in session.  Follow Up Instructions: Counselor will send information for next session via Webex.    I discussed the assessment and treatment plan with the patient. The patient was provided an opportunity to ask questions and all were answered. The patient agreed with the plan and demonstrated an understanding of the instructions.   The patient was advised to call back or seek an in-person evaluation  if the symptoms worsen or if the condition fails to improve as anticipated.  I provided 50 minutes of non-face-to-face time during this encounter.   Lise Auer, LCSW

## 2018-07-28 ENCOUNTER — Other Ambulatory Visit: Payer: Self-pay

## 2018-07-28 ENCOUNTER — Ambulatory Visit (INDEPENDENT_AMBULATORY_CARE_PROVIDER_SITE_OTHER): Payer: Medicare Other | Admitting: Psychiatry

## 2018-07-28 DIAGNOSIS — F331 Major depressive disorder, recurrent, moderate: Secondary | ICD-10-CM | POA: Diagnosis not present

## 2018-07-28 DIAGNOSIS — F4310 Post-traumatic stress disorder, unspecified: Secondary | ICD-10-CM

## 2018-07-30 ENCOUNTER — Encounter (HOSPITAL_COMMUNITY): Payer: Self-pay | Admitting: Psychiatry

## 2018-07-30 NOTE — Progress Notes (Signed)
Virtual Visit via Video Note  I connected with Melissa Gaines on 07/30/18 at  9:30 AM EDT by a video enabled telemedicine application and verified that I am speaking with the correct person using two identifiers.  Location: Patient: Melissa Gaines Provider: Lise Auer, LCSW   I discussed the limitations of evaluation and management by telemedicine and the availability of in person appointments. The patient expressed understanding and agreed to proceed.  History of Present Illness: MDD and PTSD   Observations/Objective: Counselor met with Melissa Gaines for individual therapy via Webex. Counselor assessed MH symptoms and progress on treatment plan goals. Melissa Gaines denied suicidal ideation or self-harm behaviors. Melissa Gaines shared that her main concerns are her children. She reports that she continues to struggle to get them to follow household expectations which has caused them to be behind in their online academics. Melissa Gaines reports her anxiety has increased as she suspects her teenaged daughter may be pregnant. Counselor explored her thoughts and feelings about this situation. Counselor praised her for having open communication with her daughter and for seeking medical services to determine her needs. Counselor reminded Melissa Gaines to focus on things she can control and her own self-care, as well as reaching out to her support system to process more.   Assessment and Plan: Counselor will continue to meet with Melissa Gaines to address treatment plan goals. [name] will continue to follow recommendations of providers and implement skills learned in session.  Follow Up Instructions: Counselor will send information for next session via Webex.     I discussed the assessment and treatment plan with the patient. The patient was provided an opportunity to ask questions and all were answered. The patient agreed with the plan and demonstrated an understanding of the instructions.   The patient was advised to call back or seek an  in-person evaluation if the symptoms worsen or if the condition fails to improve as anticipated.  I provided 45 minutes of non-face-to-face time during this encounter.   Lise Auer, LCSW

## 2018-08-05 ENCOUNTER — Other Ambulatory Visit (HOSPITAL_COMMUNITY): Payer: Self-pay | Admitting: Psychiatry

## 2018-08-05 DIAGNOSIS — F4310 Post-traumatic stress disorder, unspecified: Secondary | ICD-10-CM

## 2018-08-05 DIAGNOSIS — F331 Major depressive disorder, recurrent, moderate: Secondary | ICD-10-CM

## 2018-08-11 ENCOUNTER — Ambulatory Visit (HOSPITAL_COMMUNITY): Payer: Medicare Other | Admitting: Psychiatry

## 2018-08-11 ENCOUNTER — Other Ambulatory Visit: Payer: Self-pay

## 2018-08-11 DIAGNOSIS — F4310 Post-traumatic stress disorder, unspecified: Secondary | ICD-10-CM

## 2018-08-11 DIAGNOSIS — F331 Major depressive disorder, recurrent, moderate: Secondary | ICD-10-CM

## 2018-08-11 NOTE — Progress Notes (Signed)
Virtual Visit via Video Note  I connected with Melissa Gaines on 08/11/18 at 10:00 AM EDT by a video enabled telemedicine application and verified that I am speaking with the correct person using two identifiers.  Location: Patient: Melissa Gaines Provider: Lise Auer, LCSW   I discussed the limitations of evaluation and management by telemedicine and the availability of in person appointments. The patient expressed understanding and agreed to proceed.  History of Present Illness: MDD and PTSD due adverse life experiences, financial issues and family stressors.    Observations/Objective: Counselor met with Melissa Gaines for individual therapy via Webex. Counselor assessed MH symptoms and progress on treatment plan goals. Melissa Gaines denied suicidal ideation or self-harm behaviors. Melissa Gaines shared that her anxiety levels had decreased and stabilized since our last session. She had found out that her daughter is not pregnant and has been able to have good conversations with her, the boyfriend and his mother about their relationship and the expectations. Counselor praised Melissa Gaines for her parenting abilities and for her communication skills. Counselor and [name] explored her frustrations with her son and daughter's school experience and how she has communicated her concerns with the school. Counselor and Melissa Gaines spent time discussing and developing healthy boundaries in her current efforts to find a companion. Counselor explored past attempts and we role played ways to protect her and her kids from new people/men getting involved in an unhealthy manner. Counselor praised Melissa Gaines for the work she is doing and planned out next session together.   Assessment and Plan: Counselor will continue to meet with Melissa Gaines to address treatment plan goals. [name] will continue to follow recommendations of providers and implement skills learned in session.  Follow Up Instructions: Counselor will send information for next session via Webex.      I discussed the assessment and treatment plan with the patient. The patient was provided an opportunity to ask questions and all were answered. The patient agreed with the plan and demonstrated an understanding of the instructions.   The patient was advised to call back or seek an in-person evaluation if the symptoms worsen or if the condition fails to improve as anticipated.  I provided 60 minutes of non-face-to-face time during this encounter.   Lise Auer, LCSW

## 2018-08-15 ENCOUNTER — Ambulatory Visit (INDEPENDENT_AMBULATORY_CARE_PROVIDER_SITE_OTHER): Payer: Medicare Other | Admitting: Psychiatry

## 2018-08-15 ENCOUNTER — Other Ambulatory Visit: Payer: Self-pay

## 2018-08-15 ENCOUNTER — Encounter (HOSPITAL_COMMUNITY): Payer: Self-pay | Admitting: Psychiatry

## 2018-08-15 DIAGNOSIS — F4310 Post-traumatic stress disorder, unspecified: Secondary | ICD-10-CM | POA: Diagnosis not present

## 2018-08-15 DIAGNOSIS — F331 Major depressive disorder, recurrent, moderate: Secondary | ICD-10-CM

## 2018-08-15 MED ORDER — HYDROXYZINE PAMOATE 25 MG PO CAPS: 25.0000 mg | ORAL_CAPSULE | Freq: Every day | ORAL | 0 refills | Status: DC

## 2018-08-15 MED ORDER — PAROXETINE HCL 40 MG PO TABS: 40.0000 mg | ORAL_TABLET | Freq: Every day | ORAL | 0 refills | Status: DC
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
## 2018-08-24 NOTE — Progress Notes (Signed)
Request Reference Number: [id number]. AIMOVIG INJ 70MG /ML is approved through 03/08/2019. For further questions, call [phone]  Also rcvd fax from [employer]. Stating covered approved through 03/08/19 under Medicare part D benefit.

## 2018-08-24 NOTE — Progress Notes (Signed)
Initiated on Cover My Meds. Key: [id number] [employer] is reviewing your PA request. Typically an electronic response will be received within 72 hours.

## 2018-08-25 ENCOUNTER — Ambulatory Visit (HOSPITAL_COMMUNITY): Payer: Medicare Other | Admitting: Psychiatry

## 2018-09-18 ENCOUNTER — Encounter (HOSPITAL_COMMUNITY): Payer: Self-pay | Admitting: Psychiatry

## 2018-09-18 ENCOUNTER — Ambulatory Visit (INDEPENDENT_AMBULATORY_CARE_PROVIDER_SITE_OTHER): Payer: Medicare Other | Admitting: Psychiatry

## 2018-09-18 ENCOUNTER — Other Ambulatory Visit: Payer: Self-pay

## 2018-09-18 DIAGNOSIS — F331 Major depressive disorder, recurrent, moderate: Secondary | ICD-10-CM | POA: Diagnosis not present

## 2018-09-18 DIAGNOSIS — F4310 Post-traumatic stress disorder, unspecified: Secondary | ICD-10-CM

## 2018-09-18 NOTE — Progress Notes (Signed)
Virtual Visit via Video Note  I connected with Melissa Gaines on 09/18/18 at  3:00 PM EDT by a video enabled telemedicine application and verified that I am speaking with the correct person using two identifiers.  Location: Patient: Melissa Gaines Provider: Lise Auer, LCSW   I discussed the limitations of evaluation and management by telemedicine and the availability of in person appointments. The patient expressed understanding and agreed to proceed.  History of Present Illness: MDD and PTSD   Observations/Objective: Counselor met with Melissa Gaines for individual therapy via Webex. Counselor assessed MH symptoms and progress on treatment plan goals. Melissa Gaines denied suicidal ideation or self-harm behaviors. Melissa Gaines shared that she is "surviving" being at home with her children for the 5th straight month. She shared her concerns about their behaviors and concerns of them returning to school. Counselor prompted Melissa Gaines getting engaged in a support or parent group to connect with others who have similar concerns. Counselor praised her for her parenting skills and what she would have to offer to the groups. Counselor explored parenting influences she has had throughout life and her role as a parent. Counselor assessed her social outlets and basic needs. Counselor will send her information on support groups in her area that are meeting virtually.   Assessment and Plan: Counselor will continue to meet with Melissa Gaines to address treatment plan goals. Melissa Gaines will continue to follow recommendations of providers and implement skills learned in session.  Follow Up Instructions: Counselor will send information for next session via Webex.     I discussed the assessment and treatment plan with the patient. The patient was provided an opportunity to ask questions and all were answered. The patient agreed with the plan and demonstrated an understanding of the instructions.   The patient was advised to call  back or seek an in-person evaluation if the symptoms worsen or if the condition fails to improve as anticipated.  I provided 60 minutes of non-face-to-face time during this encounter.   Lise Auer, LCSW

## 2018-10-06 ENCOUNTER — Inpatient Hospital Stay: Payer: Medicare Other

## 2018-10-10 ENCOUNTER — Other Ambulatory Visit: Payer: Self-pay | Admitting: Family Medicine

## 2018-10-10 DIAGNOSIS — Z1231 Encounter for screening mammogram for malignant neoplasm of breast: Secondary | ICD-10-CM

## 2018-10-13 ENCOUNTER — Telehealth: Payer: Self-pay | Admitting: Hematology

## 2018-10-13 ENCOUNTER — Ambulatory Visit: Payer: Self-pay | Admitting: Internal Medicine

## 2018-10-13 ENCOUNTER — Other Ambulatory Visit: Payer: Self-pay

## 2018-10-13 ENCOUNTER — Inpatient Hospital Stay: Payer: Medicare Other | Attending: Internal Medicine

## 2018-10-13 DIAGNOSIS — G43909 Migraine, unspecified, not intractable, without status migrainosus: Secondary | ICD-10-CM | POA: Diagnosis not present

## 2018-10-13 DIAGNOSIS — K219 Gastro-esophageal reflux disease without esophagitis: Secondary | ICD-10-CM | POA: Insufficient documentation

## 2018-10-13 DIAGNOSIS — Z7901 Long term (current) use of anticoagulants: Secondary | ICD-10-CM | POA: Insufficient documentation

## 2018-10-13 DIAGNOSIS — G8929 Other chronic pain: Secondary | ICD-10-CM | POA: Diagnosis not present

## 2018-10-13 DIAGNOSIS — F4310 Post-traumatic stress disorder, unspecified: Secondary | ICD-10-CM | POA: Diagnosis not present

## 2018-10-13 DIAGNOSIS — R5383 Other fatigue: Secondary | ICD-10-CM | POA: Diagnosis not present

## 2018-10-13 DIAGNOSIS — F329 Major depressive disorder, single episode, unspecified: Secondary | ICD-10-CM | POA: Diagnosis not present

## 2018-10-13 DIAGNOSIS — Z9884 Bariatric surgery status: Secondary | ICD-10-CM | POA: Diagnosis not present

## 2018-10-13 DIAGNOSIS — D6861 Antiphospholipid syndrome: Secondary | ICD-10-CM | POA: Diagnosis not present

## 2018-10-13 DIAGNOSIS — M810 Age-related osteoporosis without current pathological fracture: Secondary | ICD-10-CM | POA: Insufficient documentation

## 2018-10-13 DIAGNOSIS — D6851 Activated protein C resistance: Secondary | ICD-10-CM | POA: Diagnosis not present

## 2018-10-13 DIAGNOSIS — E538 Deficiency of other specified B group vitamins: Secondary | ICD-10-CM | POA: Diagnosis not present

## 2018-10-13 DIAGNOSIS — D509 Iron deficiency anemia, unspecified: Secondary | ICD-10-CM | POA: Insufficient documentation

## 2018-10-13 DIAGNOSIS — M329 Systemic lupus erythematosus, unspecified: Secondary | ICD-10-CM | POA: Insufficient documentation

## 2018-10-13 DIAGNOSIS — D500 Iron deficiency anemia secondary to blood loss (chronic): Secondary | ICD-10-CM

## 2018-10-13 DIAGNOSIS — Z79899 Other long term (current) drug therapy: Secondary | ICD-10-CM | POA: Insufficient documentation

## 2018-10-13 LAB — CBC WITH DIFFERENTIAL/PLATELET
Abs Immature Granulocytes: 0.03 10*3/uL (ref 0.00–0.07)
Basophils Absolute: 0 10*3/uL (ref 0.0–0.1)
Basophils Relative: 1 %
Eosinophils Absolute: 0.3 10*3/uL (ref 0.0–0.5)
Eosinophils Relative: 4 %
HCT: 42 % (ref 36.0–46.0)
Hemoglobin: 13.4 g/dL (ref 12.0–15.0)
Immature Granulocytes: 0 %
Lymphocytes Relative: 37 %
Lymphs Abs: 2.6 10*3/uL (ref 0.7–4.0)
MCH: 28.2 pg (ref 26.0–34.0)
MCHC: 31.9 g/dL (ref 30.0–36.0)
MCV: 88.2 fL (ref 80.0–100.0)
Monocytes Absolute: 0.4 10*3/uL (ref 0.1–1.0)
Monocytes Relative: 5 %
Neutro Abs: 3.8 10*3/uL (ref 1.7–7.7)
Neutrophils Relative %: 53 %
Platelets: 317 10*3/uL (ref 150–400)
RBC: 4.76 MIL/uL (ref 3.87–5.11)
RDW: 13.4 % (ref 11.5–15.5)
WBC: 7 10*3/uL (ref 4.0–10.5)
nRBC: 0 % (ref 0.0–0.2)

## 2018-10-13 LAB — COMPREHENSIVE METABOLIC PANEL
ALT: 11 U/L (ref 0–44)
AST: 12 U/L — ABNORMAL LOW (ref 15–41)
Albumin: 3.6 g/dL (ref 3.5–5.0)
Alkaline Phosphatase: 69 U/L (ref 38–126)
Anion gap: 11 (ref 5–15)
BUN: 6 mg/dL (ref 6–20)
CO2: 21 mmol/L — ABNORMAL LOW (ref 22–32)
Calcium: 9 mg/dL (ref 8.9–10.3)
Chloride: 105 mmol/L (ref 98–111)
Creatinine, Ser: 0.6 mg/dL (ref 0.44–1.00)
GFR calc Af Amer: >60 mL/min (ref >60)
GFR calc non Af Amer: >60 mL/min (ref >60)
Glucose, Bld: 111 mg/dL — ABNORMAL HIGH (ref 70–99)
Potassium: 4 mmol/L (ref 3.5–5.1)
Sodium: 137 mmol/L (ref 135–145)
Total Bilirubin: <0.2 mg/dL — ABNORMAL LOW (ref 0.3–1.2)
Total Protein: 7.9 g/dL (ref 6.5–8.1)

## 2018-10-13 LAB — FERRITIN: Ferritin: 16 ng/mL (ref 11–307)

## 2018-10-13 LAB — VITAMIN B12: Vitamin B-12: 164 pg/mL — ABNORMAL LOW (ref 180–914)

## 2018-10-13 LAB — FOLATE: Folate: 8.4 ng/mL (ref >5.9)

## 2018-10-13 LAB — LACTATE DEHYDROGENASE: LDH: 149 U/L (ref 98–192)

## 2018-10-13 NOTE — Telephone Encounter (Signed)
[facility] transfer to [place]. When patient came to [facility] today for lab spoke with patient re transfer to Dr. Burr Medico. Gave patient f/u for 8/19 with Lacie.

## 2018-10-16 LAB — METHYLMALONIC ACID, SERUM: Methylmalonic Acid, Quantitative: 208 nmol/L (ref 0–378)

## 2018-10-16 LAB — PROTEIN ELECTROPHORESIS, SERUM
A/G Ratio: 1 (ref 0.7–1.7)
Albumin ELP: 3.6 g/dL (ref 2.9–4.4)
Alpha-1-Globulin: 0.2 g/dL (ref 0.0–0.4)
Alpha-2-Globulin: 0.8 g/dL (ref 0.4–1.0)
Beta Globulin: 1.1 g/dL (ref 0.7–1.3)
Gamma Globulin: 1.4 g/dL (ref 0.4–1.8)
Globulin, Total: 3.6 g/dL (ref 2.2–3.9)
Total Protein ELP: 7.2 g/dL (ref 6.0–8.5)

## 2018-10-25 ENCOUNTER — Inpatient Hospital Stay (HOSPITAL_BASED_OUTPATIENT_CLINIC_OR_DEPARTMENT_OTHER): Payer: Medicare Other | Admitting: Nurse Practitioner

## 2018-10-25 ENCOUNTER — Other Ambulatory Visit: Payer: Self-pay

## 2018-10-25 ENCOUNTER — Encounter: Payer: Self-pay | Admitting: Nurse Practitioner

## 2018-10-25 VITALS — BP 114/64 | HR 73 | Temp 98.9°F | Resp 18 | Ht <= 58 in | Wt 213.2 lb

## 2018-10-25 DIAGNOSIS — M810 Age-related osteoporosis without current pathological fracture: Secondary | ICD-10-CM | POA: Diagnosis not present

## 2018-10-25 DIAGNOSIS — D509 Iron deficiency anemia, unspecified: Secondary | ICD-10-CM | POA: Diagnosis not present

## 2018-10-25 DIAGNOSIS — G8929 Other chronic pain: Secondary | ICD-10-CM | POA: Diagnosis not present

## 2018-10-25 DIAGNOSIS — D6851 Activated protein C resistance: Secondary | ICD-10-CM | POA: Diagnosis not present

## 2018-10-25 DIAGNOSIS — Z7901 Long term (current) use of anticoagulants: Secondary | ICD-10-CM | POA: Diagnosis not present

## 2018-10-25 DIAGNOSIS — Z9884 Bariatric surgery status: Secondary | ICD-10-CM | POA: Diagnosis not present

## 2018-10-25 DIAGNOSIS — R5383 Other fatigue: Secondary | ICD-10-CM | POA: Diagnosis not present

## 2018-10-25 DIAGNOSIS — M329 Systemic lupus erythematosus, unspecified: Secondary | ICD-10-CM | POA: Diagnosis not present

## 2018-10-25 DIAGNOSIS — E538 Deficiency of other specified B group vitamins: Secondary | ICD-10-CM | POA: Diagnosis not present

## 2018-10-25 DIAGNOSIS — Z79899 Other long term (current) drug therapy: Secondary | ICD-10-CM | POA: Diagnosis not present

## 2018-10-25 DIAGNOSIS — D6861 Antiphospholipid syndrome: Secondary | ICD-10-CM | POA: Diagnosis not present

## 2018-10-25 DIAGNOSIS — D508 Other iron deficiency anemias: Secondary | ICD-10-CM

## 2018-10-25 DIAGNOSIS — K219 Gastro-esophageal reflux disease without esophagitis: Secondary | ICD-10-CM | POA: Diagnosis not present

## 2018-10-25 DIAGNOSIS — G43909 Migraine, unspecified, not intractable, without status migrainosus: Secondary | ICD-10-CM | POA: Diagnosis not present

## 2018-10-25 MED ORDER — CYANOCOBALAMIN 1000 MCG/ML IJ SOLN: 1000.0000 ug | INTRAMUSCULAR | 0 refills | Status: DC

## 2018-10-25 MED ORDER — CYANOCOBALAMIN 1000 MCG/ML IJ SOLN: 1000.0000 ug | Freq: Once | INTRAMUSCULAR | Status: DC

## 2018-10-25 MED ORDER — "SYRINGE/NEEDLE (DISP) 25G X 5/8"" 1 ML MISC": 3 refills | Status: DC

## 2018-10-25 MED ORDER — CYANOCOBALAMIN 1000 MCG/ML IJ SOLN
1000.0000 ug | Freq: Once | INTRAMUSCULAR | Status: AC
  Administered 2018-10-25: 1000 ug via SUBCUTANEOUS

## 2018-10-25 MED ORDER — CYANOCOBALAMIN 1000 MCG/ML IJ SOLN
INTRAMUSCULAR | Status: AC
  Filled 2018-10-25: qty 1

## 2018-10-25 NOTE — Progress Notes (Addendum)
[name]   [phone] [phone]) [phone]   Clinic Follow up Note   Patient Care Team: [name], MD as PCP - Almond Lint, DO as Consulting Physician (Neurology) 10/25/2018  CHIEF COMPLAINT: f/u iron deficiency anemia  CURRENT THERAPY:  1. IV Feraheme 510 mg 04/28/18, 05/05/18 2. Oral ferrous sulfate, takes 1-3 tabs daily  3. Apixaban 2.5 mg BID  4. Begin B12 injection 1000 mcg monthly, first dose today in clinic then will do at home  DIAGNOSIS LIST: 1. Anemia - on 12/19/2017 ferritin 6 serum iron 19 TIBC 478, 4% transferrin saturation Hgb 12.2 consistent with iron deficiency, began oral iron; ferritin on 04/14/18 <4, patient received IV Feraheme weekly x2 on 04/28/18 and 05/05/18   2. B12 on 10/13/27 low 164; pending GI eval. She reports history of B12 deficiency and injections while living in [place], none in last 3 years since relocating to [facility]. H/o gastric bypass surgery in 2014  3. 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 G2P2  4. SLE - presented with butterfly rash in 2005, per historical note she was treated with plaquenil and prednisone but changed to plaquenil and methotrexate due to drug AE's. Positive ANA titer on 11/15/2016; lupus anticoagulant positive on 07/08/17, 07/22/17, and 05/01/18. The antiphospholipid antibody IgG 46.1 (<15); antiphospholipid antibody IgM 13.4 (<15); antiphospholipid antibody IgA 21(<15);beta-2 glycoprotein 1 antibody IgM19.4 (<15); beta-2 glycoprotein antibody IgG G 50.2 (<15).Followed by Rheumatologist Dr. Gerilyn Nestle at [facility]  5. Hypercoagulation work up: On 12/19/2017 showed slightly elevated protein C activity to 181% (range 73-180%) and elevated factor VIII assay to 223%, elevated beta-2 glycoprotein IgG to 27, and elevated cardiolipin antibodies IgG, IgM, and IgA; otherwise hemoglobinopathy evaluation, Antithrombin III,  protein S activity; factor V Leiden, and prothrombin gene were normal  INTERVAL HISTORY: [name] returns for f/u as scheduled. She was previously seen by Dr. Walden Field. She continues anticoagulation with eliquis. She tolerates well. She takes oral iron 1-3 tabs daily depending on how she feels. Denies epistaxis. Denies bloody or black stools. Her headaches improved after IV iron dose. Denies cough, chest pain, dyspnea, leg swelling. LMP early Aug, lasts 7 days and heavy for 4 days requiring product change q30-60 mins. She is more fatigued lately. Weight has increased, she is not very active due to her disability. She has 2 children with behavior challenges and she is stressed at home. 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 Socially she is single, 2 children. 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 Does not drive. She denies tobacco or recent alcohol use. Family history is significant for mother who is deceased, had liver cancer and SLE. MGM and PGM with cancer, and a "[place] aunt" who had throat cancer.    MEDICAL HISTORY:  Past Medical History:  Diagnosis Date  . Anemia   . Anxiety   . Asthma   . Chronic pain   . Congenital hip dislocation   . GERD (gastroesophageal reflux disease)   . Iron deficiency anemia 04/14/2018  . Kidney mass 2017  . Migraine   . Osteoporosis   . Pathological dislocation of shoulder joint, bilateral    congential  . PTSD (post-traumatic stress disorder)   .  Sleep apnea   . Systemic lupus erythematosus ([place])     SURGICAL HISTORY: Past Surgical History:  Procedure Laterality Date  . CESAREAN SECTION  2005  . CESAREAN SECTION W/BTL  2010  . HIP SURGERY     in childhood for  congenital hip dislocation  . Marcus RESECTION  2014  . STAPEDECTOMY  11/01/2011   L postauricular stapedectomy with CO2 laser and insertion of 6 x 4.75 mm SMart Piston  . TOTAL HIP ARTHROPLASTY     05/2000 R, 11/2000 L    I have reviewed the social history and family history with the patient and they are unchanged from previous note.  ALLERGIES:  is allergic to fish allergy.  MEDICATIONS:  Current Outpatient Medications  Medication Sig Dispense Refill  . acetaminophen (TYLENOL) 500 MG tablet Take 500 mg by mouth every 6 (six) hours as needed for headache.    [name] Kitchen apixaban (ELIQUIS) 2.5 MG TABS tablet Take 1 tablet (2.5 mg total) by mouth 2 (two) times daily. 60 tablet 2  . ferrous sulfate 325 (65 FE) MG tablet Take 325 mg by mouth daily with breakfast.    . hydrOXYzine (VISTARIL) 25 MG capsule Take 1 capsule (25 mg total) by mouth at bedtime. 90 capsule 0  . PARoxetine (PAXIL) 40 MG tablet Take 1 tablet (40 mg total) by mouth daily. 90 tablet 0  . rizatriptan (MAXALT) 10 MG tablet Take 1 tablet earliest onset of migraine.  May repeat in 2 hours if needed.  Maximum 2 tablets in 24hrs 10 tablet 3  . Vitamin D, Ergocalciferol, (DRISDOL) 50000 units CAPS capsule Take 1 capsule (50,000 Units total) by mouth every 7 (seven) days. 8 capsule 0  . cyanocobalamin (,VITAMIN B-12,) 1000 MCG/ML injection Inject 1 mL (1,000 mcg total) into the skin every 30 (thirty) days. 5 mL 0  . SUMAtriptan (IMITREX) 100 MG tablet Take 0.5 tablets (50 mg total) by mouth once for 1 dose. May repeat in 2 hours if headache persists or recurs. 1 tablet 0  . SYRINGE/NEEDLE, DISP, 1 ML 25G X 5/8" 1 ML MISC Match with B12 3 each 3  . topiramate (TOPAMAX) 100 MG tablet Take 1 tablet (100 mg total) by mouth daily. 30 tablet 2   No current facility-administered medications for this visit.     PHYSICAL EXAMINATION: ECOG PERFORMANCE STATUS: 2 - Symptomatic, <50% confined to bed  Vitals:   10/25/18 0941   BP: 114/64  Pulse: 73  Resp: 18  Temp: 98.9 F (37.2 C)  SpO2: 98%   Filed Weights   10/25/18 0941  Weight: 213 lb 3.2 oz (96.7 kg)    GENERAL:alert, no distress and comfortable SKIN: no obvious rash  EYES: sclera clear LUNGS: distant, with normal breathing effort HEART: regular rate & rhythm, no lower extremity edema ABDOMEN:abdomen soft, non-tender and normal bowel sounds Musculoskeletal:no cyanosis of digits  NEURO: alert & oriented x 3 with fluent speech, normal gait  LABORATORY DATA:  I have reviewed the data as listed CBC Latest Ref Rng & Units 10/13/2018 05/26/2018 04/14/2018  WBC 4.0 - 10.5 K/uL 7.0 5.1 6.8  Hemoglobin 12.0 - 15.0 g/dL 13.4 12.8 11.4(L)  Hematocrit 36.0 - 46.0 % 42.0 40.7 37.9  Platelets 150 - 400 K/uL 317 277 281     CMP Latest Ref Rng & Units 10/13/2018 05/26/2018 04/14/2018  Glucose 70 - 99 mg/dL 111(H) 91 99  BUN 6 - 20 mg/dL 6 8 6   Creatinine 0.44 - 1.00 mg/dL 0.60  0.64 0.66  Sodium 135 - 145 mmol/L 137 140 139  Potassium 3.5 - 5.1 mmol/L 4.0 3.9 3.8  Chloride 98 - 111 mmol/L 105 110 109  CO2 22 - 32 mmol/L 21(L) 20(L) 21(L)  Calcium 8.9 - 10.3 mg/dL 9.0 8.4(L) 8.9  Total Protein 6.5 - 8.1 g/dL 7.9 7.5 7.9  Total Bilirubin 0.3 - 1.2 mg/dL <0.2(L) 0.2(L) 0.5  Alkaline Phos 38 - 126 U/L 69 67 72  AST 15 - 41 U/L 12(L) 10(L) 15  ALT 0 - 44 U/L 11 10 14       RADIOGRAPHIC STUDIES: I have personally reviewed the radiological images as listed and agreed with the findings in the report. No results found.   ASSESSMENT & PLAN: 48 yo female with   1. Iron deficiency anemia due to chronic blood loss from menorrhagia and malabsorption due to gastric bypass surgery -She takes oral iron 1-3 times daily, which is not sufficient for her.  -S/p IV Feraheme x2 in 04/2018. She initially responded well, but ferritin on 10/13/18 down to 16; she is symptomatic with fatigue.   -The patient was seen with Dr. Burr Medico who discussed giving one dose IV Feraheme this week  to build her reserve, goal ferritin >50. The patient is in favor. She tolerated IV Feraheme in the past.  -Due to her heavy menses, Dr. Burr Medico recommended to hold eliquis 2 days before bleeding starts for up to 5 days, she will resume when bleeding lightens. She understands.  -She will return for lab in 3 and 6 months, f/u in 6 months or sooner if needed.  -She will call if symptoms of anemia worsen. Will arrange IV Feraheme PRN based on labs.   2. B12 deficiency, secondary to previous gastric bypass surgery -We reviewed her medical record; on 10/13/18 B12 level decreased to 164  -She also has what appears to represent peripheral neuropathy.  -Will initiate B12 injection. She will receive first dose today in clinic with nurse teaching so she can administer at home going forward.  -Dose will be 1000 mcg subQ monthly.  -Will monitor levels periodically   3. SLE  - followed by Rheum at [facility]  4. Antiphospholipid syndrome  -continue eliquis, she tolerates well.  -No bleeding.  -Refills per PCP   5. Migraines -followed by Neurology   PLAN: -Labs, medical record reviewed -IV Feraheme x1 in the next week; goal ferritin >50  -Continue oral iron  -Begin B12 injection, first dose today in clinic then once monthly at home -B12 inj teaching today per my nurse  -Continue eliquis, OK to hold eliquis 2 days before menstrual bleeding starts for up to 5 days, then resume when bleeding lightens  Orders Placed This Encounter  Procedures  . CBC with Differential (Cancer Center Only)    Standing Status:   Standing    Number of Occurrences:   10    Standing Expiration Date:   10/25/2019  . CMP ([facility] only)    Standing Status:   Standing    Number of Occurrences:   10    Standing Expiration Date:   10/25/2019  . Iron and TIBC    Standing Status:   Standing    Number of Occurrences:   10    Standing Expiration Date:   10/25/2019  . Ferritin    Standing Status:   Standing    Number of  Occurrences:   10    Standing Expiration Date:   10/25/2019  . Vitamin B12  Standing Status:   Standing    Number of Occurrences:   10    Standing Expiration Date:   10/25/2019   All questions were answered. The patient knows to call the clinic with any problems, questions or concerns. No barriers to learning was detected.     Alla Feeling, NP 10/25/18   Addendum  I have seen the patient, examined her. I agree with the assessment and and plan and have edited the notes.   I have reviewed her chart including lab results. Her iron deficient anemia is likely related to menorrhagia and gastric bypass surgery,will continue iv iron. Her anemia has resolved. She also has B12 deficiency which is probably related to gastric bypass surgery, she is comfortable to do B12 injection monthly at home. She also has antiphospholipid syndrome, based on her history of PE, and positive antiphospholipid antibodies and lupus anticoagulant.  I agree with long-term anticoagulation.  Patient is a Jehovah witness, would not take blood transfusion, so I recommend her to hold Eliquis during her menstrual, to reduce her heavy bleeding.  We will continue monitoring her lab and f/u her in our clinic.  Truitt Merle  10/25/2018

## 2018-10-26 ENCOUNTER — Telehealth: Payer: Self-pay | Admitting: Nurse Practitioner

## 2018-10-26 NOTE — Telephone Encounter (Signed)
Scheduled appt per 8/19 los. ° °Spoke with patient and she is aware of her appt date and time. °

## 2018-10-27 ENCOUNTER — Encounter: Payer: Self-pay | Admitting: Neurology

## 2018-10-29 NOTE — Progress Notes (Signed)
Virtual Visit via Telephone Note The purpose of this virtual visit is to provide medical care while limiting exposure to the novel coronavirus.    Consent was obtained for phone visit:  Yes Answered questions that patient had about telehealth interaction:  Yes I discussed the limitations, risks, security and privacy concerns of performing an evaluation and management service by telephone. I also discussed with the patient that there may be a patient responsible charge related to this service. The patient expressed understanding and agreed to proceed.  Pt location: Home Physician Location: Home Name of referring provider:  Bufford Lope, DO I connected with .[name] at patients initiation/request on 10/30/2018 at  8:30 AM EDT by telephone and verified that I am speaking with the correct person using two identifiers.  Pt MRN:  [id number] Pt DOB:  1970/09/25   History of Present Illness:  Melissa Gaines is a 48 year old woman with PTSD, anxiety, chronic dizziness, migraines, SLE and antiphospholipid antibody syndrome with history of PE on chronic anticoagulation who follows up for migraines.  UPDATE: Started Aimovig for preventative and rizatriptan for abortive therapy.  She only received 2 shots because insurance wouldn't approve it.  She still had headaches daily but intensity was a lot less.    Intensity:  severe Duration:  5 hours or more. Frequency:  daily Frequency of abortive medication: once or twice a week  Rescue therapy:  Extra-Strength Tylenol with 2 ASA 325mg  and sometimes cup of coffee (makes her jittery) Current NSAIDS:  None Current analgesics:  None Current triptans:  sumatriptan 100mg  Current ergotamine:  none Current anti-emetic:  none Current muscle relaxants:  none Current anti-anxiolytic:  hydroxyzine Current sleep aide:  none Current Antihypertensive medications:  none Current Antidepressant medications:  Paxil 40mg  Current  Anticonvulsant medications:  topiramate 100mg  (but she ran out) Current anti-CGRP:  Aimovig 70mg  Current Vitamins/Herbal/Supplements:  D, folic acid Current Antihistamines/Decongestants:  none Other therapy:  Icy-cold  Hormone/birth control:  none Other medications:  Eliquis, methotrexate  Caffeine:  No coffee.  Pepsi daily. Alcohol:  no Smoker:  no Diet:  4 glasses of water daily.  Skips meals Exercise:  no Depression:  yes; Anxiety:  yes Other pain:  Joint pain, back pain, lumbar radicular pain Sleep hygiene:  varies  HISTORY: Onset:  Mild migraines in 1990s but worse since birth of son in 2010 Location:  Left temple radiating down to back of head Quality:  pounding, shocks Initial Intensity:  Severe.  She denies new headache, thunderclap headache  Aura:  no Premonitory Phase:  no Postdrome:  no Associated symptoms:  Photophobia, phonophobia, dizziness, stuttering speech. If severe, she has memory deficits such as difficulty spelling her name or remembering her social security number.  She denies associated nausea, vomiting, visual disturbance, unilateral numbness or weakness. Initial Duration:  Varies.  1 hour to several hours.  Last month, she had one that lasted 5 days. Initial Frequency:  Daily Initial Frequency of abortive medication: ASA and Tylenol daily Triggers:  Unknown Relieving factors:  Resting in dark quiet room, cold packs Activity:  aggravates  Past NSAIDS:  ibuprofen, ASA 325mg  Past analgesics:  Extra-strength Tylenol Past abortive triptans:  rizatriptan  Past abortive ergotamine:  none Past muscle relaxants:  none Past anti-emetic:  none Past antihypertensive medications:  none Past antidepressant medications:  none Past anticonvulsant medications:  none Past anti-CGRP:  none Past vitamins/Herbal/Supplements:  none Past antihistamines/decongestants:  none Other past therapies:  none  Family  history of headache:  Mom  Of note, she was told she  had a 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  However, she followed up with another neurologist in [place] who performed a repeat brain MRI and was told that it was gone.    Observations/Objective:   Height 4\' 10"  (1.473 m), weight 213 lb (96.6 kg). No acute distress.  Alert and oriented.  Speech fluent and not dysarthric.  Language intact.   Assessment and Plan:   Migraine without aura, without status migrainosus, not intractable  1.  For preventative management, we will check with insurance to see if we can get Aimovig or other anti-CGRP covered.  Otherwise, we ill prescribe something else, such as nortriptyline or propranolol. 2.  For abortive therapy, sumatriptan refilled. 3.  Limit use of pain relievers to no more than 2 days out of week to prevent risk of rebound or medication-overuse headache. 4.  Keep headache diary 5.  Exercise, hydration, caffeine cessation, sleep hygiene, monitor for and avoid triggers 6.  Consider:  magnesium citrate 400mg  daily, riboflavin 400mg  daily, and coenzyme Q10 100mg  three times daily 7. Always keep in mind that currently taking a hormone or birth control may be a possible trigger or aggravating factor for migraine. 8. Follow up 4 months    Follow Up Instructions:    -I discussed the assessment and treatment plan with the patient. The patient was provided an opportunity to ask questions and all were answered. The patient agreed with the plan and demonstrated an understanding of the instructions.   The patient was advised to call back or seek an in-person evaluation if the symptoms worsen or if the condition fails to improve as anticipated.    Total Time spent in visit with the patient was:  13 minutes    Dudley Major, DO

## 2018-10-30 ENCOUNTER — Encounter: Payer: Self-pay | Admitting: Neurology

## 2018-10-30 ENCOUNTER — Ambulatory Visit (INDEPENDENT_AMBULATORY_CARE_PROVIDER_SITE_OTHER): Payer: Medicare Other

## 2018-10-30 ENCOUNTER — Telehealth (INDEPENDENT_AMBULATORY_CARE_PROVIDER_SITE_OTHER): Payer: Medicare Other | Admitting: Neurology

## 2018-10-30 ENCOUNTER — Other Ambulatory Visit: Payer: Self-pay

## 2018-10-30 VITALS — BP 110/70 | HR 93 | Temp 98.0°F | Ht <= 58 in | Wt 210.0 lb

## 2018-10-30 VITALS — Ht <= 58 in | Wt 213.0 lb

## 2018-10-30 DIAGNOSIS — G43709 Chronic migraine without aura, not intractable, without status migrainosus: Secondary | ICD-10-CM | POA: Diagnosis not present

## 2018-10-30 DIAGNOSIS — Z0000 Encounter for general adult medical examination without abnormal findings: Secondary | ICD-10-CM

## 2018-10-30 DIAGNOSIS — G43809 Other migraine, not intractable, without status migrainosus: Secondary | ICD-10-CM

## 2018-10-30 MED ORDER — SUMATRIPTAN SUCCINATE 100 MG PO TABS: ORAL_TABLET | ORAL | 3 refills | Status: DC

## 2018-10-30 NOTE — Progress Notes (Signed)
Subjective:   Melissa Gaines is a 48 y.o. female who presents for Medicare Annual (Subsequent) preventive examination.  The patient consented to a virtual visit.  Review of Systems: Defer to PCP    Objective:    Vitals: BP 110/70   Pulse 93   Temp 98 F (36.7 C) (Oral)   Ht 4\' 10"  (1.473 m)   Wt 210 lb (95.3 kg)   LMP 10/07/2018   SpO2 96%   BMI 43.89 kg/m   Body mass index is 43.89 kg/m.  Advanced Directives 10/30/2018 10/30/2018 10/27/2018 06/28/2018 04/14/2018 01/25/2018 01/18/2018  Does Patient Have a Medical Advance Directive? No No No Yes No No No  Type of Advance Directive - - - [place]  Does patient want to make changes to medical advance directive? - - - No - Patient declined - - -  Copy of [place] in Chart? - - - No - copy requested - - -  Would patient like information on creating a medical advance directive? Yes (MAU/Ambulatory/Procedural Areas - Information given) No - Guardian declined No - Patient declined - No - Patient declined No - Patient declined No - Patient declined    Tobacco Social History   Tobacco Use  Smoking Status Never Smoker  Smokeless Tobacco Never Used     Clinical Intake:  Pre-visit preparation completed: Yes  Pain Score: 10-Worst pain ever   How often do you need to have someone help you when you read instructions, pamphlets, or other written materials from your doctor or pharmacy?: 3 - Sometimes What is the last grade level you completed in school?: 11th grade  Interpreter Needed?: No  Past Medical History:  Diagnosis Date  . Anemia   . Anxiety   . Asthma   . Chronic pain   . Congenital hip dislocation   . GERD (gastroesophageal reflux disease)   . Iron deficiency anemia 04/14/2018  . Kidney mass 2017  . Migraine   . Osteoporosis   . Pathological dislocation of shoulder joint, bilateral    congential  . PTSD (post-traumatic stress disorder)   . Sleep apnea    . Systemic lupus erythematosus ([place])    Past Surgical History:  Procedure Laterality Date  . CESAREAN SECTION  2005  . CESAREAN SECTION W/BTL  2010  . HIP SURGERY     in childhood for congenital hip dislocation  . [place] RESECTION  2014  . STAPEDECTOMY  11/01/2011   L postauricular stapedectomy with CO2 laser and insertion of 6 x 4.75 mm SMart Piston  . TOTAL HIP ARTHROPLASTY     05/2000 R, 11/2000 L   Family History  Problem Relation Age of Onset  . Asthma Mother   . Liver cancer Mother   . Heart Problems Mother        heart attack and heart failure  . Kidney Stones Mother   . Osteoporosis Mother   . Stroke Mother   . Depression Mother   . Anxiety disorder Mother   . Kidney Stones Father   . Hernia Father   . Kidney disease Father   . Depression Sister   . Anxiety disorder Sister   . Asthma Sister   . Cancer Maternal Grandmother   . Cancer Paternal Grandmother   . Cancer Other    Social History   Socioeconomic History  . Marital status: Single    Spouse name: Not on file  . Number of  children: 2  . Years of education: some high school  . Highest education level: 11th grade  Occupational History  . Occupation: Disabled   Social Needs  . Financial resource strain: Very hard  . Food insecurity    Worry: Often true    Inability: Often true  . Transportation needs    Medical: Yes    Non-medical: Yes  Tobacco Use  . Smoking status: Never Smoker  . Smokeless tobacco: Never Used  Substance and Sexual Activity  . Alcohol use: Not Currently    Comment: when she was 48 years old; none after having children   . Drug use: No  . Sexual activity: Not Currently    Partners: Male    Birth control/protection: Condom, Surgical  Lifestyle  . Physical activity    Days per week: 0 days    Minutes per session: 0 min  . Stress: To some extent  Relationships  . Social connections    Talks on phone: More than three times a week    Gets together: Once  a week    Attends religious service: Never    Active member of club or organization: No    Attends meetings of clubs or organizations: Never    Relationship status: Never married  Other Topics Concern  . Not on file  Social History Narrative   Lives with her 2 children and dog here in [place].   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    She likes to spend time with her children and write poems.   Patient is dependent on sister, Mohammed Kindle. Mohammed Kindle runs her errands for her and helps her with her children.    Outpatient Encounter Medications as of 10/30/2018  Medication Sig  . acetaminophen (TYLENOL) 500 MG tablet Take 500 mg by mouth every 6 (six) hours as needed for headache.  [name] Kitchen apixaban (ELIQUIS) 2.5 MG TABS tablet Take 1 tablet (2.5 mg total) by mouth 2 (two) times daily.  . cyanocobalamin (,VITAMIN B-12,) 1000 MCG/ML injection Inject 1 mL (1,000 mcg total) into the skin every 30 (thirty) days.  . ferrous sulfate 325 (65 FE) MG tablet Take 325 mg by mouth daily with breakfast.  . hydrOXYzine (VISTARIL) 25 MG capsule Take 1 capsule (25 mg total) by mouth at bedtime.  [name] Kitchen PARoxetine (PAXIL) 40 MG tablet Take 1 tablet (40 mg total) by mouth daily.  . SUMAtriptan (IMITREX) 100 MG tablet Take 1 tablet earliest onset of headache.  May repeat in 2 hours if headache persists or recurs.  Maximum 2 tablets in 24 hours  . SYRINGE/NEEDLE, DISP, 1 ML 25G X 5/8" 1 ML MISC Match with B12  . Vitamin D, Ergocalciferol, (DRISDOL) 50000 units CAPS capsule Take 1 capsule (50,000 Units total) by mouth every 7 (seven) days.  [name] Kitchen AIMOVIG 70 MG/ML SOAJ    No facility-administered encounter medications on file as of 10/30/2018.     Activities of Daily Living In your present state of health, do you have any difficulty performing the following activities: 10/30/2018  Hearing? Y  Comment deaf left ear  Vision? N  Difficulty concentrating or making decisions? N  Walking or climbing stairs? Y   Dressing or bathing? N  Doing errands, shopping? Y  Comment sister runs her errands  Conservation officer, nature and eating ? N  Using the Toilet? N  In the past six months, have you accidently leaked urine? N  Do you have problems with loss of bowel control?  N  Managing your Medications? N  Managing your Finances? N  Housekeeping or managing your Housekeeping? N  Some recent data might be hidden    Patient Care Team: [name], MD as PCP - Almond Lint, DO as Consulting Physician (Neurology)    Assessment:   This is a routine wellness examination for Dajia.  Exercise Activities and Dietary recommendations Current Exercise Habits: The patient does not participate in regular exercise at present, Exercise limited by: orthopedic condition(s)  Goals    . Weight (lb) < 200 lb (90.7 kg)       Fall Risk Fall Risk  10/30/2018 10/27/2018 06/28/2018 12/16/2016 12/06/2016  Falls in the past year? 1 0 0 No No  Number falls in past yr: 0 - - - -  Injury with Fall? 1 - - - -  Risk for fall due to : Impaired balance/gait - - - -  Follow up Falls prevention discussed - Falls evaluation completed - -   Is the patient's home free of loose throw rugs in walkways, pet beds, electrical cords, etc?   yes      Grab bars in the bathroom? yes      Handrails on the stairs?   yes      Adequate lighting?   yes  Patient rating of health (0-10) scale: 5   Depression Screen PHQ 2/9 Scores 10/30/2018 01/25/2018 07/26/2017 05/23/2017  PHQ - 2 Score 2 0 0 0     Cognitive Function     6CIT Screen 10/30/2018  What Year? 0 points  What month? 0 points  What time? 0 points  Count back from 20 0 points  Months in reverse 0 points  Repeat phrase 0 points  Total Score 0    Immunization History  Administered Date(s) Administered  . Influenza,inj,Quad PF,6+ Mos 11/15/2016  . Tdap 12/16/2016     Screening Tests Health Maintenance  Topic Date Due  . INFLUENZA VACCINE  06/06/2019 (Originally  10/07/2018)  . MAMMOGRAM  09/01/2019  . PAP SMEAR-Modifier  12/17/2019  . TETANUS/TDAP  12/17/2026  . HIV Screening  Completed    Cancer Screenings: Lung: Low Dose CT Chest recommended if Age 62-80 years, 30 pack-year currently smoking OR have quit w/in 15years. Patient does not qualify. Breast:  Up to date on Mammogram? Yes   Up to date of Bone Density/Dexa? Yes Colorectal: has never had one  Additional Screenings: Hepatitis C Screening: Completed   Plan:  Complete the advanced directive packet with your sister.  You declined your flu vaccine today, please consider getting one in the next month or so.  Keep your mammogram appointment scheduled for September.   I have personally reviewed and noted the following in the patient's chart:   . Medical and social history . Use of alcohol, tobacco or illicit drugs  . Current medications and supplements . Functional ability and status . Nutritional status . Physical activity . Advanced directives . List of other physicians . Hospitalizations, surgeries, and ER visits in previous 12 months . Vitals . Screenings to include cognitive, depression, and falls . Referrals and appointments  In addition, I have reviewed and discussed with patient certain preventive protocols, quality metrics, and best practice recommendations. A written personalized care plan for preventive services as well as general preventive health recommendations were provided to patient.    Dorna Bloom, [place]  10/30/2018

## 2018-10-30 NOTE — Patient Instructions (Addendum)
You spoke to Melissa Gaines, [place] for your annual wellness visit.  We discussed goals: Goals    . Weight (lb) < 200 lb (90.7 kg)      We also discussed recommended health maintenance. Please call our office and schedule a visit. As discussed, you are due for your flu vaccine. You declined this today. Please consider getting one in the next month or so.   Health Maintenance  Topic Date Due  . INFLUENZA VACCINE  06/06/2019 (Originally 10/07/2018)  . MAMMOGRAM  09/01/2019  . PAP SMEAR-Modifier  12/17/2019  . TETANUS/TDAP  12/17/2026  . HIV Screening  Completed   Complete the advanced directive packet with your sister.   We also discussed weight and BMI. See handouts I have given you.   Here is an example of what a healthy plate looks like:    ? Make half your plate fruits and vegetables.     ? Focus on whole fruits.     ? Vary your veggies.  ? Make half your grains whole grains. -     ? Look for the word "whole" at the beginning of the ingredients list    ? Some whole-grain ingredients include whole oats, whole-wheat flour,        whole-grain corn, whole-grain brown rice, and whole rye.  ? Move to low-fat and fat-free milk or yogurt.  ? Vary your protein routine. - Meat, fish, poultry (chicken, [place]), eggs, beans (kidney, pinto), dairy.  ? Drink and eat less sodium, saturated fat, and added sugars.   Preventive Care 1-59 Years Old, Female Preventive care refers to visits with your health care provider and lifestyle choices that can promote health and wellness. This includes:  A yearly physical exam. This may also be called an annual well check.  Regular dental visits and eye exams.  Immunizations.  Screening for certain conditions.  Healthy lifestyle choices, such as eating a healthy diet, getting regular exercise, not using drugs or products that contain nicotine and tobacco, and limiting alcohol use. What can I expect for my preventive care visit? Physical exam  Your health care provider will check your:  Height and weight. This may be used to calculate body mass index (BMI), which tells if you are at a healthy weight.  Heart rate and blood pressure.  Skin for abnormal spots. Counseling Your health care provider may ask you questions about your:  Alcohol, tobacco, and drug use.  Emotional well-being.  Home and relationship well-being.  Sexual activity.  Eating habits.  Work and work Statistician.  Method of birth control.  Menstrual cycle.  Pregnancy history. What immunizations do I need?  Influenza (flu) vaccine  This is recommended every year. Tetanus, diphtheria, and pertussis (Tdap) vaccine  You may need a Td booster every 10 years. Varicella (chickenpox) vaccine  You may need this if you have not been vaccinated. Zoster (shingles) vaccine  You may need this after age 5. Measles, mumps, and rubella (MMR) vaccine  You may need at least one dose of MMR if you were born in 1957 or later. You may also need a second dose. Pneumococcal conjugate (PCV13) vaccine  You may need this if you have certain conditions and were not previously vaccinated. Pneumococcal polysaccharide (PPSV23) vaccine  You may need one or two doses if you smoke cigarettes or if you have certain conditions. Meningococcal conjugate (MenACWY) vaccine  You may need this if you have certain conditions. Hepatitis A vaccine  You may need this if  you have certain conditions or if you travel or work in places where you may be exposed to hepatitis A. Hepatitis B vaccine  You may need this if you have certain conditions or if you travel or work in places where you may be exposed to hepatitis B. Haemophilus influenzae type b (Hib) vaccine  You may need this if you have certain conditions. Human papillomavirus (HPV) vaccine  If recommended by your health care provider, you may need three doses over 6 months. You may receive vaccines as individual doses  or as more than one vaccine together in one shot (combination vaccines). Talk with your health care provider about the risks and benefits of combination vaccines. What tests do I need? Blood tests  Lipid and cholesterol levels. These may be checked every 5 years, or more frequently if you are over 3 years old.  Hepatitis C test.  Hepatitis B test. Screening  Lung cancer screening. You may have this screening every year starting at age 68 if you have a 30-pack-year history of smoking and currently smoke or have quit within the past 15 years.  Colorectal cancer screening. All adults should have this screening starting at age 37 and continuing until age 70. Your health care provider may recommend screening at age 48 if you are at increased risk. You will have tests every 1-10 years, depending on your results and the type of screening test.  Diabetes screening. This is done by checking your blood sugar (glucose) after you have not eaten for a while (fasting). You may have this done every 1-3 years.  Mammogram. This may be done every 1-2 years. Talk with your health care provider about when you should start having regular mammograms. This may depend on whether you have a family history of breast cancer.  BRCA-related cancer screening. This may be done if you have a family history of breast, ovarian, tubal, or peritoneal cancers.  Pelvic exam and Pap test. This may be done every 3 years starting at age 81. Starting at age 10, this may be done every 5 years if you have a Pap test in combination with an HPV test. Other tests  Sexually transmitted disease (STD) testing.  Bone density scan. This is done to screen for osteoporosis. You may have this scan if you are at high risk for osteoporosis. Follow these instructions at home: Eating and drinking  Eat a diet that includes fresh fruits and vegetables, whole grains, lean protein, and low-fat dairy.  Take vitamin and mineral supplements as  recommended by your health care provider.  Do not drink alcohol if: ? Your health care provider tells you not to drink. ? You are pregnant, may be pregnant, or are planning to become pregnant.  If you drink alcohol: ? Limit how much you have to 0-1 drink a day. ? Be aware of how much alcohol is in your drink. In the [place] one drink equals one 12 oz bottle of beer (355 mL), one 5 oz glass of wine (148 mL), or one 1 oz glass of hard liquor (44 mL). Lifestyle  Take daily care of your teeth and gums.  Stay active. Exercise for at least 30 minutes on 5 or more days each week.  Do not use any products that contain nicotine or tobacco, such as cigarettes, e-cigarettes, and chewing tobacco. If you need help quitting, ask your health care provider.  If you are sexually active, practice safe sex. Use a condom or other form of birth control (  contraception) in order to prevent pregnancy and STIs (sexually transmitted infections).  If told by your health care provider, take low-dose aspirin daily starting at age 93. What's next?  Visit your health care provider once a year for a well check visit.  Ask your health care provider how often you should have your eyes and teeth checked.  Stay up to date on all vaccines. This information is not intended to replace advice given to you by your health care provider. Make sure you discuss any questions you have with your health care provider. Document Released: 03/21/2015 Document Revised: 11/03/2017 Document Reviewed: 11/03/2017 Elsevier Patient Education  2020 [place] [facility]'s number is [phone]. Please call with questions or concerns about what we discussed today.

## 2018-11-01 ENCOUNTER — Inpatient Hospital Stay: Payer: Medicare Other

## 2018-11-01 ENCOUNTER — Other Ambulatory Visit: Payer: Self-pay | Admitting: Hematology

## 2018-11-01 ENCOUNTER — Telehealth: Payer: Self-pay

## 2018-11-01 ENCOUNTER — Telehealth: Payer: Self-pay | Admitting: *Deleted

## 2018-11-01 NOTE — Telephone Encounter (Signed)
Yes, please schedule her injection monthly until her next OV in Feb 2021. First dose tomorrow with iron infusion. I will check her B12 injection orders. Thanks   Truitt Merle MD

## 2018-11-01 NOTE — Telephone Encounter (Signed)
Pt. not arrived for her Iron infusion. Called her and she stated that she had the appt. down for Thursday08/27/20. Nurse told Pt. appt. was for today at 0830. Pt. states she will try to find transportation and call infusion back.

## 2018-11-01 NOTE — Telephone Encounter (Signed)
Pt called requesting to come in office for B12 injections.  Stated her copay is too high for her to do self B12 injections at home.  Wanted to know if she could come in office for the injections. Pt's   Phone    [phone].

## 2018-11-01 NOTE — Progress Notes (Signed)
I have reviewed this visit and agree with the documentation.  [name], MD Family Medicine PGY-1

## 2018-11-01 NOTE — Telephone Encounter (Signed)
Called patient back to inform her that appointment has been rescheduled for tomorrow 11/02/18 at 0830. She verbalized understanding.

## 2018-11-02 ENCOUNTER — Other Ambulatory Visit: Payer: Self-pay

## 2018-11-02 ENCOUNTER — Other Ambulatory Visit: Payer: Self-pay | Admitting: Hematology

## 2018-11-02 ENCOUNTER — Ambulatory Visit (INDEPENDENT_AMBULATORY_CARE_PROVIDER_SITE_OTHER): Payer: Medicare Other | Admitting: Psychiatry

## 2018-11-02 ENCOUNTER — Inpatient Hospital Stay: Payer: Medicare Other

## 2018-11-02 VITALS — BP 112/71 | HR 78 | Temp 98.2°F | Resp 20

## 2018-11-02 DIAGNOSIS — D6851 Activated protein C resistance: Secondary | ICD-10-CM | POA: Diagnosis not present

## 2018-11-02 DIAGNOSIS — M329 Systemic lupus erythematosus, unspecified: Secondary | ICD-10-CM | POA: Diagnosis not present

## 2018-11-02 DIAGNOSIS — D509 Iron deficiency anemia, unspecified: Secondary | ICD-10-CM | POA: Diagnosis not present

## 2018-11-02 DIAGNOSIS — M810 Age-related osteoporosis without current pathological fracture: Secondary | ICD-10-CM | POA: Diagnosis not present

## 2018-11-02 DIAGNOSIS — G8929 Other chronic pain: Secondary | ICD-10-CM | POA: Diagnosis not present

## 2018-11-02 DIAGNOSIS — F4310 Post-traumatic stress disorder, unspecified: Secondary | ICD-10-CM

## 2018-11-02 DIAGNOSIS — Z9884 Bariatric surgery status: Secondary | ICD-10-CM | POA: Diagnosis not present

## 2018-11-02 DIAGNOSIS — R5383 Other fatigue: Secondary | ICD-10-CM | POA: Diagnosis not present

## 2018-11-02 DIAGNOSIS — D508 Other iron deficiency anemias: Secondary | ICD-10-CM

## 2018-11-02 DIAGNOSIS — F331 Major depressive disorder, recurrent, moderate: Secondary | ICD-10-CM | POA: Diagnosis not present

## 2018-11-02 DIAGNOSIS — Z79899 Other long term (current) drug therapy: Secondary | ICD-10-CM | POA: Diagnosis not present

## 2018-11-02 DIAGNOSIS — E538 Deficiency of other specified B group vitamins: Secondary | ICD-10-CM | POA: Diagnosis not present

## 2018-11-02 DIAGNOSIS — K219 Gastro-esophageal reflux disease without esophagitis: Secondary | ICD-10-CM | POA: Diagnosis not present

## 2018-11-02 DIAGNOSIS — G43909 Migraine, unspecified, not intractable, without status migrainosus: Secondary | ICD-10-CM | POA: Diagnosis not present

## 2018-11-02 DIAGNOSIS — Z7901 Long term (current) use of anticoagulants: Secondary | ICD-10-CM | POA: Diagnosis not present

## 2018-11-02 DIAGNOSIS — D6861 Antiphospholipid syndrome: Secondary | ICD-10-CM | POA: Diagnosis not present

## 2018-11-02 MED ORDER — SODIUM CHLORIDE 0.9 % IV SOLN
510.0000 mg | Freq: Once | INTRAVENOUS | Status: AC
  Administered 2018-11-02: 510 mg via INTRAVENOUS
  Filled 2018-11-02: qty 510

## 2018-11-02 MED ORDER — CYANOCOBALAMIN 1000 MCG/ML IJ SOLN: 1000.0000 ug | Freq: Once | INTRAMUSCULAR | Status: DC

## 2018-11-02 NOTE — Progress Notes (Signed)
Virtual Visit via Video Note  I connected with Melissa Gaines on 11/02/18 at  3:00 PM EDT by a video enabled telemedicine application and verified that I am speaking with the correct person using two identifiers.  Location: Patient: Melissa Gaines Provider: Lise Auer, LCSW   I discussed the limitations of evaluation and management by telemedicine and the availability of in person appointments. The patient expressed understanding and agreed to proceed.  History of Present Illness: PTSD and MDD   Observations/Objective: Counselor met with Melissa Gaines for individual therapy via Webex. Counselor assessed MH symptoms and progress on treatment plan goals. Melissa Gaines denied suicidal ideation or self-harm behaviors. Melissa Gaines shared that she was currently experiencing a migraine due to increased activity and leaving the home yesterday. Counselor assessed her medical conditions and symptoms that are impacting her mental health. Melissa Gaines reported that her children are doing school from home and that she continues to struggle with their behaviors, but that she is glad they are home because she is in a high risk category for contracting COVID19. Counselor processed parenting and communication skills with Melissa Gaines, sharing psychoeducation on specific topics. Melissa Gaines reported that she intends on getting her children back into therapy and on their medications since school has started back. Counselor promoted self-care and we made a plan to schedule upcoming appointments.   Assessment and Plan: Counselor will continue to meet with patient to address treatment plan goals. Patient will continue to follow recommendations of providers and implement skills learned in session.  Follow Up Instructions: Counselor will send information for next session via Webex.     I discussed the assessment and treatment plan with the patient. The patient was provided an opportunity to ask questions and all were answered. The patient  agreed with the plan and demonstrated an understanding of the instructions.   The patient was advised to call back or seek an in-person evaluation if the symptoms worsen or if the condition fails to improve as anticipated.  I provided 30 minutes of non-face-to-face time during this encounter.   Lise Auer, LCSW

## 2018-11-02 NOTE — Patient Instructions (Addendum)
Ferumoxytol injection (IV Iron) What is this medicine? FERUMOXYTOL is an iron complex. Iron is used to make healthy red blood cells, which carry oxygen and nutrients throughout the body. This medicine is used to treat iron deficiency anemia. This medicine may be used for other purposes; ask your health care provider or pharmacist if you have questions. COMMON BRAND NAME(S): Feraheme What should I tell my health care provider before I take this medicine? They need to know if you have any of these conditions:  anemia not caused by low iron levels  high levels of iron in the blood  magnetic resonance imaging (MRI) test scheduled  an unusual or allergic reaction to iron, other medicines, foods, dyes, or preservatives  pregnant or trying to get pregnant  breast-feeding How should I use this medicine? This medicine is for injection into a vein. It is given by a health care professional in a hospital or clinic setting. Talk to your pediatrician regarding the use of this medicine in children. Special care may be needed. Overdosage: If you think you have taken too much of this medicine contact a poison control center or emergency room at once. NOTE: This medicine is only for you. Do not share this medicine with others. What if I miss a dose? It is important not to miss your dose. Call your doctor or health care professional if you are unable to keep an appointment. What may interact with this medicine? This medicine may interact with the following medications:  other iron products This list may not describe all possible interactions. Give your health care provider a list of all the medicines, herbs, non-prescription drugs, or dietary supplements you use. Also tell them if you smoke, drink alcohol, or use illegal drugs. Some items may interact with your medicine. What should I watch for while using this medicine? Visit your doctor or healthcare professional regularly. Tell your doctor or  healthcare professional if your symptoms do not start to get better or if they get worse. You may need blood work done while you are taking this medicine. You may need to follow a special diet. Talk to your doctor. Foods that contain iron include: whole grains/cereals, dried fruits, beans, or peas, leafy green vegetables, and organ meats (liver, kidney). What side effects may I notice from receiving this medicine? Side effects that you should report to your doctor or health care professional as soon as possible:  allergic reactions like skin rash, itching or hives, swelling of the face, lips, or tongue  breathing problems  changes in blood pressure  feeling faint or lightheaded, falls  fever or chills  flushing, sweating, or hot feelings  swelling of the ankles or feet Side effects that usually do not require medical attention (report to your doctor or health care professional if they continue or are bothersome):  diarrhea  headache  nausea, vomiting  stomach pain This list may not describe all possible side effects. Call your doctor for medical advice about side effects. You may report side effects to FDA at 1-800-FDA-1088. Where should I keep my medicine? This drug is given in a hospital or clinic and will not be stored at home. NOTE: This sheet is a summary. It may not cover all possible information. If you have questions about this medicine, talk to your doctor, pharmacist, or health care provider.  2020 Elsevier/Gold Standard (2016-04-12 20:21:10)  

## 2018-11-02 NOTE — Progress Notes (Signed)
Pt observed for 30 minutes post feraheme infusion. New schedule given with b12 injections.

## 2018-11-03 ENCOUNTER — Encounter (HOSPITAL_COMMUNITY): Payer: Self-pay | Admitting: Psychiatry

## 2018-11-15 ENCOUNTER — Other Ambulatory Visit: Payer: Self-pay

## 2018-11-15 ENCOUNTER — Encounter (HOSPITAL_COMMUNITY): Payer: Self-pay | Admitting: Psychiatry

## 2018-11-15 ENCOUNTER — Ambulatory Visit (INDEPENDENT_AMBULATORY_CARE_PROVIDER_SITE_OTHER): Payer: Medicare Other | Admitting: Psychiatry

## 2018-11-15 DIAGNOSIS — F4310 Post-traumatic stress disorder, unspecified: Secondary | ICD-10-CM | POA: Diagnosis not present

## 2018-11-15 DIAGNOSIS — F331 Major depressive disorder, recurrent, moderate: Secondary | ICD-10-CM

## 2018-11-15 MED ORDER — HYDROXYZINE PAMOATE 50 MG PO CAPS: 50.0000 mg | ORAL_CAPSULE | Freq: Every day | ORAL | 0 refills | Status: DC

## 2018-11-15 MED ORDER — PAROXETINE HCL 40 MG PO TABS: 40.0000 mg | ORAL_TABLET | Freq: Every day | ORAL | 0 refills | Status: DC
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
## 2018-11-20 DIAGNOSIS — H04123 Dry eye syndrome of bilateral lacrimal glands: Secondary | ICD-10-CM | POA: Diagnosis not present

## 2018-11-20 DIAGNOSIS — H25813 Combined forms of age-related cataract, bilateral: Secondary | ICD-10-CM | POA: Diagnosis not present

## 2018-11-28 ENCOUNTER — Ambulatory Visit
Admission: RE | Admit: 2018-11-28 | Discharge: 2018-11-28 | Disposition: A | Discharge status: Home / Self Care | Payer: Medicare Other | Source: Ambulatory Visit | Attending: Family Medicine | Admitting: Family Medicine

## 2018-11-28 ENCOUNTER — Other Ambulatory Visit: Payer: Self-pay

## 2018-11-28 DIAGNOSIS — Z1231 Encounter for screening mammogram for malignant neoplasm of breast: Secondary | ICD-10-CM | POA: Diagnosis not present

## 2018-12-01 ENCOUNTER — Other Ambulatory Visit: Payer: Self-pay

## 2018-12-01 ENCOUNTER — Inpatient Hospital Stay: Payer: Medicare Other | Attending: Nurse Practitioner

## 2018-12-01 VITALS — BP 118/71 | Temp 98.9°F | Resp 20

## 2018-12-01 DIAGNOSIS — Z79899 Other long term (current) drug therapy: Secondary | ICD-10-CM | POA: Diagnosis not present

## 2018-12-01 DIAGNOSIS — D509 Iron deficiency anemia, unspecified: Secondary | ICD-10-CM | POA: Diagnosis not present

## 2018-12-01 DIAGNOSIS — D508 Other iron deficiency anemias: Secondary | ICD-10-CM

## 2018-12-01 MED ORDER — CYANOCOBALAMIN 1000 MCG/ML IJ SOLN
INTRAMUSCULAR | Status: AC
  Filled 2018-12-01: qty 1

## 2018-12-01 MED ORDER — CYANOCOBALAMIN 1000 MCG/ML IJ SOLN
1000.0000 ug | Freq: Once | INTRAMUSCULAR | Status: AC
  Administered 2018-12-01: 1000 ug via INTRAMUSCULAR

## 2018-12-01 NOTE — Patient Instructions (Signed)
Cyanocobalamin, Pyridoxine, and Folate What is this medicine? A multivitamin containing folic acid, vitamin B6, and vitamin B12. This medicine may be used for other purposes; ask your health care provider or pharmacist if you have questions. COMMON BRAND NAME(S): AllanFol RX, AllanTex, Av-Vite FB, B Complex with Folic Acid, ComBgen, FaBB, Folamin, Folastin, Folbalin, Folbee, Folbic, Folcaps, Folgard, Folgard RX, Folgard RX 2.2, Folplex, Folplex 2.2, Foltabs 800, Foltx, Homocysteine Formula, Niva-Fol, NuFol, TL Gard RX, Virt-Gard, Virt-Vite, Virt-Vite Forte, Vita-Respa What should I tell my health care provider before I take this medicine? They need to know if you have any of these conditions:  bleeding or clotting disorder  history of anemia of any type  other chronic health condition  an unusual or allergic reaction to vitamins, other medicines, foods, dyes, or preservatives  pregnant or trying to get pregnant  breast-feeding How should I use this medicine? Take by mouth with a glass of water. May take with food. Follow the directions on the prescription label. It is usually given once a day. Do not take your medicine more often than directed. Contact your pediatrician regarding the use of this medicine in children. Special care may be needed. Overdosage: If you think you have taken too much of this medicine contact a poison control center or emergency room at once. NOTE: This medicine is only for you. Do not share this medicine with others. What if I miss a dose? If you miss a dose, take it as soon as you can. If it is almost time for your next dose, take only that dose. Do not take double or extra doses. What may interact with this medicine?  levodopa This list may not describe all possible interactions. Give your health care provider a list of all the medicines, herbs, non-prescription drugs, or dietary supplements you use. Also tell them if you smoke, drink alcohol, or use illegal  drugs. Some items may interact with your medicine. What should I watch for while using this medicine? See your health care professional for regular checks on your progress. Remember that vitamin supplements do not replace the need for good nutrition from a balanced diet. What side effects may I notice from receiving this medicine? Side effects that you should report to your doctor or health care professional as soon as possible:  allergic reaction such as skin rash or difficulty breathing  vomiting Side effects that usually do not require medical attention (report to your doctor or health care professional if they continue or are bothersome):  nausea  stomach upset This list may not describe all possible side effects. Call your doctor for medical advice about side effects. You may report side effects to FDA at 1-800-FDA-1088. Where should I keep my medicine? Keep out of the reach of children. Most vitamins should be stored at controlled room temperature. Check your specific product directions. Protect from heat and moisture. Throw away any unused medicine after the expiration date. NOTE: This sheet is a summary. It may not cover all possible information. If you have questions about this medicine, talk to your doctor, pharmacist, or health care provider.  2020 Elsevier/Gold Standard (2007-04-15 00:59:55)  

## 2018-12-14 ENCOUNTER — Ambulatory Visit (INDEPENDENT_AMBULATORY_CARE_PROVIDER_SITE_OTHER): Payer: Medicare Other | Admitting: Psychiatry

## 2018-12-14 ENCOUNTER — Other Ambulatory Visit: Payer: Self-pay

## 2018-12-14 DIAGNOSIS — F4310 Post-traumatic stress disorder, unspecified: Secondary | ICD-10-CM | POA: Diagnosis not present

## 2018-12-14 DIAGNOSIS — F331 Major depressive disorder, recurrent, moderate: Secondary | ICD-10-CM

## 2018-12-14 NOTE — Progress Notes (Signed)
Virtual Visit via Video Note  I connected with Melissa Gaines on 12/14/18 at  2:00 PM EDT by a video enabled telemedicine application and verified that I am speaking with the correct person using two identifiers.  Location: Patient: Melissa Gaines Provider: Lise Auer, LCSW   I discussed the limitations of evaluation and management by telemedicine and the availability of in person appointments. The patient expressed understanding and agreed to proceed.  History of Present Illness: PTSD and MDD   Observations/Objective: Counselor met with Melissa Gaines for individual therapy via Webex. Counselor assessed MH symptoms and progress on treatment plan goals. Melissa Gaines's anxiety is mild to moderate and depression mild. Melissa Gaines denied suicidal ideation or self-harm behaviors. Melissa Gaines shared that she recently had a negative interaction over the phone with her ex-husband, which has caused her to ruminate on his words, flashing back to past traumas related to him and others who have been negative to her in the past. Counselor shared psychoeducation about PTSD and trauma triggers. Melissa Gaines connected that she is also being triggered by her son, when he says hurtful words to her. Counselor discussed family dynamics and childhood development. Melissa Gaines connected with the information and was able to have a different mindset about his interactions with her. Counselor and Melissa Gaines identified healthy ways to deal with trauma responses. Melissa Gaines shared that she is connecting well with her children's schools and is also in touch with DSS/Housing for ongoing basic need provision, due to fixed income. Counselor and Melissa Gaines discussed getting involved with virtual support groups, however Melissa Gaines was resistant to this based on cultural beliefs.   Assessment and Plan: Counselor will continue to meet with patient to address treatment plan goals. Patient will continue to follow recommendations of providers and implement skills learned in  session.  Follow Up Instructions: Counselor will send information for next session via Webex.     I discussed the assessment and treatment plan with the patient. The patient was provided an opportunity to ask questions and all were answered. The patient agreed with the plan and demonstrated an understanding of the instructions.   The patient was advised to call back or seek an in-person evaluation if the symptoms worsen or if the condition fails to improve as anticipated.  I provided 55 minutes of non-face-to-face time during this encounter.   Lise Auer, LCSW

## 2018-12-16 ENCOUNTER — Encounter (HOSPITAL_COMMUNITY): Payer: Self-pay | Admitting: Psychiatry

## 2018-12-28 ENCOUNTER — Encounter (HOSPITAL_COMMUNITY): Payer: Self-pay | Admitting: Psychiatry

## 2018-12-28 ENCOUNTER — Ambulatory Visit (INDEPENDENT_AMBULATORY_CARE_PROVIDER_SITE_OTHER): Payer: Medicare Other | Admitting: Psychiatry

## 2018-12-28 ENCOUNTER — Other Ambulatory Visit: Payer: Self-pay

## 2018-12-28 DIAGNOSIS — F4310 Post-traumatic stress disorder, unspecified: Secondary | ICD-10-CM | POA: Diagnosis not present

## 2018-12-28 DIAGNOSIS — F331 Major depressive disorder, recurrent, moderate: Secondary | ICD-10-CM
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
## 2018-12-29 ENCOUNTER — Other Ambulatory Visit: Payer: Self-pay

## 2018-12-29 ENCOUNTER — Inpatient Hospital Stay: Payer: Medicare Other | Attending: Nurse Practitioner

## 2018-12-29 VITALS — BP 106/78 | HR 78 | Temp 98.7°F | Resp 18

## 2018-12-29 DIAGNOSIS — E538 Deficiency of other specified B group vitamins: Secondary | ICD-10-CM | POA: Insufficient documentation

## 2018-12-29 DIAGNOSIS — D508 Other iron deficiency anemias: Secondary | ICD-10-CM

## 2018-12-29 MED ORDER — CYANOCOBALAMIN 1000 MCG/ML IJ SOLN
INTRAMUSCULAR | Status: AC
  Filled 2018-12-29: qty 1

## 2018-12-29 MED ORDER — CYANOCOBALAMIN 1000 MCG/ML IJ SOLN
1000.0000 ug | Freq: Once | INTRAMUSCULAR | Status: AC
  Administered 2018-12-29: 1000 ug via INTRAMUSCULAR

## 2018-12-29 NOTE — Patient Instructions (Signed)
Cyanocobalamin, Pyridoxine, and Folate What is this medicine? A multivitamin containing folic acid, vitamin B6, and vitamin B12. This medicine may be used for other purposes; ask your health care provider or pharmacist if you have questions. COMMON BRAND NAME(S): AllanFol RX, AllanTex, Av-Vite FB, B Complex with Folic Acid, ComBgen, FaBB, Folamin, Folastin, Folbalin, Folbee, Folbic, Folcaps, Folgard, Folgard RX, Folgard RX 2.2, Folplex, Folplex 2.2, Foltabs 800, Foltx, Homocysteine Formula, Niva-Fol, NuFol, TL Gard RX, Virt-Gard, Virt-Vite, Virt-Vite Forte, Vita-Respa What should I tell my health care provider before I take this medicine? They need to know if you have any of these conditions:  bleeding or clotting disorder  history of anemia of any type  other chronic health condition  an unusual or allergic reaction to vitamins, other medicines, foods, dyes, or preservatives  pregnant or trying to get pregnant  breast-feeding How should I use this medicine? Take by mouth with a glass of water. May take with food. Follow the directions on the prescription label. It is usually given once a day. Do not take your medicine more often than directed. Contact your pediatrician regarding the use of this medicine in children. Special care may be needed. Overdosage: If you think you have taken too much of this medicine contact a poison control center or emergency room at once. NOTE: This medicine is only for you. Do not share this medicine with others. What if I miss a dose? If you miss a dose, take it as soon as you can. If it is almost time for your next dose, take only that dose. Do not take double or extra doses. What may interact with this medicine?  levodopa This list may not describe all possible interactions. Give your health care provider a list of all the medicines, herbs, non-prescription drugs, or dietary supplements you use. Also tell them if you smoke, drink alcohol, or use illegal  drugs. Some items may interact with your medicine. What should I watch for while using this medicine? See your health care professional for regular checks on your progress. Remember that vitamin supplements do not replace the need for good nutrition from a balanced diet. What side effects may I notice from receiving this medicine? Side effects that you should report to your doctor or health care professional as soon as possible:  allergic reaction such as skin rash or difficulty breathing  vomiting Side effects that usually do not require medical attention (report to your doctor or health care professional if they continue or are bothersome):  nausea  stomach upset This list may not describe all possible side effects. Call your doctor for medical advice about side effects. You may report side effects to FDA at 1-800-FDA-1088. Where should I keep my medicine? Keep out of the reach of children. Most vitamins should be stored at controlled room temperature. Check your specific product directions. Protect from heat and moisture. Throw away any unused medicine after the expiration date. NOTE: This sheet is a summary. It may not cover all possible information. If you have questions about this medicine, talk to your doctor, pharmacist, or health care provider.  2020 Elsevier/Gold Standard (2007-04-15 00:59:55)  

## 2019-01-09 ENCOUNTER — Ambulatory Visit (INDEPENDENT_AMBULATORY_CARE_PROVIDER_SITE_OTHER): Payer: Medicare Other | Admitting: Psychiatry

## 2019-01-09 ENCOUNTER — Encounter (HOSPITAL_COMMUNITY): Payer: Self-pay | Admitting: Psychiatry

## 2019-01-09 ENCOUNTER — Other Ambulatory Visit: Payer: Self-pay

## 2019-01-09 DIAGNOSIS — F331 Major depressive disorder, recurrent, moderate: Secondary | ICD-10-CM | POA: Diagnosis not present

## 2019-01-09 DIAGNOSIS — F4310 Post-traumatic stress disorder, unspecified: Secondary | ICD-10-CM | POA: Diagnosis not present

## 2019-01-09 NOTE — Progress Notes (Signed)
Virtual Visit via Video Note  I connected with Melissa Gaines on 01/09/19 at  2:00 PM EST by a video enabled telemedicine application and verified that I am speaking with the correct person using two identifiers.  Location: Patient: Melissa Gaines Provider: Lise Auer, LCSW   I discussed the limitations of evaluation and management by telemedicine and the availability of in person appointments. The patient expressed understanding and agreed to proceed.  History of Present Illness: PTSD and MDD   Observations/Objective: Counselor met with Melissa for individual therapy via Webex. Counselor assessed MH symptoms and progress on treatment plan goals. Melissa presented with moderate depression and mild anxiety. Melissa denied suicidal ideation or self-harm behaviors. Melissa shared that she has become more content with the way her and her children are functioning in this phase of the pandemic. Counselor assessed daily functioning and self-care routine. Melissa desired to discuss the impact of past relationships in relation to her self-esteem, self-concept and self-worth. Counselor ad Melissa Gaines explored relationship history, generational patterns, trauma responses, her view of others, her family and how she parents. Counselor provided psychoeducation on self-esteem and confidence building post traumas. Melissa Gaines would like to continue exploring this area of her life more in session and through self work.   Assessment and Plan: Counselor will continue to meet with patient to address treatment plan goals. Patient will continue to follow recommendations of providers and implement skills learned in session.  Follow Up Instructions: Counselor will send information for next session via Webex.    I discussed the assessment and treatment plan with the patient. The patient was provided an opportunity to ask questions and all were answered. The patient agreed with the plan and demonstrated an understanding of the  instructions.   The patient was advised to call back or seek an in-person evaluation if the symptoms worsen or if the condition fails to improve as anticipated.  I provided 60 minutes of non-face-to-face time during this encounter.   Lise Auer, LCSW

## 2019-01-19 ENCOUNTER — Other Ambulatory Visit: Payer: Self-pay | Admitting: Neurology

## 2019-01-19 DIAGNOSIS — G43809 Other migraine, not intractable, without status migrainosus: Secondary | ICD-10-CM

## 2019-01-22 ENCOUNTER — Other Ambulatory Visit: Payer: Self-pay

## 2019-01-22 ENCOUNTER — Encounter (HOSPITAL_COMMUNITY): Payer: Self-pay | Admitting: Psychiatry

## 2019-01-22 ENCOUNTER — Ambulatory Visit (INDEPENDENT_AMBULATORY_CARE_PROVIDER_SITE_OTHER): Payer: Medicare Other | Admitting: Psychiatry

## 2019-01-22 DIAGNOSIS — F331 Major depressive disorder, recurrent, moderate: Secondary | ICD-10-CM | POA: Diagnosis not present

## 2019-01-22 DIAGNOSIS — F4310 Post-traumatic stress disorder, unspecified: Secondary | ICD-10-CM | POA: Diagnosis not present
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
## 2019-01-26 ENCOUNTER — Inpatient Hospital Stay: Payer: Medicare Other

## 2019-01-26 ENCOUNTER — Other Ambulatory Visit: Payer: Self-pay

## 2019-01-26 ENCOUNTER — Inpatient Hospital Stay: Payer: Medicare Other | Attending: Nurse Practitioner

## 2019-01-26 VITALS — BP 107/71 | HR 63 | Temp 99.1°F | Resp 18

## 2019-01-26 DIAGNOSIS — Z79899 Other long term (current) drug therapy: Secondary | ICD-10-CM | POA: Insufficient documentation

## 2019-01-26 DIAGNOSIS — D509 Iron deficiency anemia, unspecified: Secondary | ICD-10-CM | POA: Insufficient documentation

## 2019-01-26 DIAGNOSIS — E538 Deficiency of other specified B group vitamins: Secondary | ICD-10-CM

## 2019-01-26 DIAGNOSIS — D508 Other iron deficiency anemias: Secondary | ICD-10-CM

## 2019-01-26 LAB — CMP (CANCER CENTER ONLY)
ALT: 16 U/L (ref 0–44)
AST: 15 U/L (ref 15–41)
Albumin: 3.9 g/dL (ref 3.5–5.0)
Alkaline Phosphatase: 71 U/L (ref 38–126)
Anion gap: 12 (ref 5–15)
BUN: 7 mg/dL (ref 6–20)
CO2: 24 mmol/L (ref 22–32)
Calcium: 9 mg/dL (ref 8.9–10.3)
Chloride: 103 mmol/L (ref 98–111)
Creatinine: 0.64 mg/dL (ref 0.44–1.00)
GFR, Est AFR Am: >60 mL/min (ref >60)
GFR, Estimated: >60 mL/min (ref >60)
Glucose, Bld: 86 mg/dL (ref 70–99)
Potassium: 4.4 mmol/L (ref 3.5–5.1)
Sodium: 139 mmol/L (ref 135–145)
Total Bilirubin: 0.3 mg/dL (ref 0.3–1.2)
Total Protein: 8.2 g/dL — ABNORMAL HIGH (ref 6.5–8.1)

## 2019-01-26 LAB — CBC WITH DIFFERENTIAL (CANCER CENTER ONLY)
Abs Immature Granulocytes: 0.08 10*3/uL — ABNORMAL HIGH (ref 0.00–0.07)
Basophils Absolute: 0.1 10*3/uL (ref 0.0–0.1)
Basophils Relative: 1 %
Eosinophils Absolute: 0.2 10*3/uL (ref 0.0–0.5)
Eosinophils Relative: 2 %
HCT: 46.6 % — ABNORMAL HIGH (ref 36.0–46.0)
Hemoglobin: 15.3 g/dL — ABNORMAL HIGH (ref 12.0–15.0)
Immature Granulocytes: 1 %
Lymphocytes Relative: 36 %
Lymphs Abs: 2.6 10*3/uL (ref 0.7–4.0)
MCH: 28.4 pg (ref 26.0–34.0)
MCHC: 32.8 g/dL (ref 30.0–36.0)
MCV: 86.5 fL (ref 80.0–100.0)
Monocytes Absolute: 0.4 10*3/uL (ref 0.1–1.0)
Monocytes Relative: 5 %
Neutro Abs: 3.9 10*3/uL (ref 1.7–7.7)
Neutrophils Relative %: 55 %
Platelet Count: 307 10*3/uL (ref 150–400)
RBC: 5.39 MIL/uL — ABNORMAL HIGH (ref 3.87–5.11)
RDW: 15.3 % (ref 11.5–15.5)
WBC Count: 7.1 10*3/uL (ref 4.0–10.5)
nRBC: 0 % (ref 0.0–0.2)

## 2019-01-26 LAB — IRON AND TIBC
Iron: 68 ug/dL (ref 41–142)
Saturation Ratios: 17 % — ABNORMAL LOW (ref 21–57)
TIBC: 398 ug/dL (ref 236–444)
UIBC: 329 ug/dL (ref 120–384)

## 2019-01-26 LAB — FERRITIN: Ferritin: 63 ng/mL (ref 11–307)

## 2019-01-26 LAB — VITAMIN B12: Vitamin B-12: 220 pg/mL (ref 180–914)

## 2019-01-26 MED ORDER — CYANOCOBALAMIN 1000 MCG/ML IJ SOLN
INTRAMUSCULAR | Status: AC
  Filled 2019-01-26: qty 1

## 2019-01-26 MED ORDER — CYANOCOBALAMIN 1000 MCG/ML IJ SOLN
1000.0000 ug | Freq: Once | INTRAMUSCULAR | Status: AC
  Administered 2019-01-26: 11:00:00 1000 ug via INTRAMUSCULAR

## 2019-01-26 NOTE — Patient Instructions (Signed)

## 2019-01-30 ENCOUNTER — Telehealth: Payer: Self-pay | Admitting: *Deleted

## 2019-01-30 DIAGNOSIS — D500 Iron deficiency anemia secondary to blood loss (chronic): Secondary | ICD-10-CM | POA: Diagnosis not present

## 2019-01-30 DIAGNOSIS — R1314 Dysphagia, pharyngoesophageal phase: Secondary | ICD-10-CM | POA: Diagnosis not present

## 2019-01-30 DIAGNOSIS — K219 Gastro-esophageal reflux disease without esophagitis: Secondary | ICD-10-CM | POA: Diagnosis not present

## 2019-01-30 NOTE — Telephone Encounter (Signed)
-----   Message from Alla Feeling, NP sent at 01/30/2019  2:15 PM EST ----- Please let her know labs. Anemia resolved. Iron levels are good, continue current dose of oral iron. B12 improved, continue monthly injection. Call us back if she has any issues or concerns.  Thanks, Regan Rakers NP

## 2019-01-30 NOTE — Telephone Encounter (Signed)
Per [name], NP, notified pt anemia has resolved, iron levels are good and continue current dose of oral iron. Also B12 improved and to continue monthly injections. Advised if there are any concerns or issues to call office. Pt verbalized understanding.

## 2019-02-06 ENCOUNTER — Other Ambulatory Visit: Payer: Self-pay

## 2019-02-06 ENCOUNTER — Encounter (HOSPITAL_COMMUNITY): Payer: Self-pay | Admitting: Psychiatry

## 2019-02-06 ENCOUNTER — Ambulatory Visit (INDEPENDENT_AMBULATORY_CARE_PROVIDER_SITE_OTHER): Payer: Medicare Other | Admitting: Psychiatry

## 2019-02-06 DIAGNOSIS — F331 Major depressive disorder, recurrent, moderate: Secondary | ICD-10-CM | POA: Diagnosis not present

## 2019-02-06 DIAGNOSIS — F4310 Post-traumatic stress disorder, unspecified: Secondary | ICD-10-CM

## 2019-02-06 NOTE — Progress Notes (Signed)
Virtual Visit via Video Note  I connected with Melissa Gaines on 02/06/19 at  4:00 PM EST by a video enabled telemedicine application and verified that I am speaking with the correct person using two identifiers.  Location: Patient: Melissa Gaines Provider: Lise Auer, LCSW   I discussed the limitations of evaluation and management by telemedicine and the availability of in person appointments. The patient expressed understanding and agreed to proceed.  History of Present Illness: PTSD and MDD   Observations/Objective: Counselor met with Melissa Gaines for individual therapy via Webex. Counselor assessed MH symptoms and progress on treatment plan goals. Melissa Gaines presents with mild anxiety and moderate depression. Melissa Gaines denied suicidal ideation or self-harm behaviors. Melissa Gaines shared that she is currently having GI pains and a headache. Counselor encouraged her to rest and relax during the session. Counselor engaged Melissa Gaines in [employer] exercises prompt relaxation. Counselor assessed daily functioning and together identified the need for more structure and routine for her and her children to allow for self-care and reduced anxiety for Melissa Gaines. Melissa Gaines identified basic need challenges with current income and children's needs. Counselor shared local resources with Melissa Gaines to connect with for support. Counselor and Melissa Gaines processed holiday traditions within her family and how the impact of COVID will change those for her this year. Melissa Gaines stated that she was starting to feel less impacted by the GI pains at the end of the session. She planned to remain resting and will contact providers if pain persist.   Assessment and Plan: Counselor will continue to meet with patient to address treatment plan goals. Patient will continue to follow recommendations of providers and implement skills learned in session.  Follow Up Instructions: Counselor will send information for next session via Webex.     I discussed the  assessment and treatment plan with the patient. The patient was provided an opportunity to ask questions and all were answered. The patient agreed with the plan and demonstrated an understanding of the instructions.   The patient was advised to call back or seek an in-person evaluation if the symptoms worsen or if the condition fails to improve as anticipated.  I provided 60 minutes of non-face-to-face time during this encounter.   Lise Auer, LCSW

## 2019-02-14 ENCOUNTER — Encounter (HOSPITAL_COMMUNITY): Payer: Self-pay | Admitting: Psychiatry

## 2019-02-14 ENCOUNTER — Ambulatory Visit (INDEPENDENT_AMBULATORY_CARE_PROVIDER_SITE_OTHER): Payer: Medicare Other | Admitting: Psychiatry

## 2019-02-14 ENCOUNTER — Other Ambulatory Visit: Payer: Self-pay

## 2019-02-14 DIAGNOSIS — F331 Major depressive disorder, recurrent, moderate: Secondary | ICD-10-CM | POA: Diagnosis not present

## 2019-02-14 DIAGNOSIS — F4310 Post-traumatic stress disorder, unspecified: Secondary | ICD-10-CM | POA: Diagnosis not present

## 2019-02-14 MED ORDER — HYDROXYZINE PAMOATE 50 MG PO CAPS: 50.0000 mg | ORAL_CAPSULE | Freq: Every day | ORAL | 0 refills | Status: DC

## 2019-02-14 MED ORDER — PAROXETINE HCL 40 MG PO TABS: 40.0000 mg | ORAL_TABLET | Freq: Every day | ORAL | 0 refills | Status: DC
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
## 2019-02-19 NOTE — Progress Notes (Signed)
(  Key: [id number]) Aimovig 70MG /ML auto-injectors     Wait for Determination Please wait for [employer] 2017 NCPDP to return a determination.

## 2019-02-20 NOTE — Progress Notes (Signed)
Status of request approved 03/07/2020 [id number] [phone] [employer] [facility]

## 2019-02-21 ENCOUNTER — Encounter: Payer: Self-pay | Admitting: Neurology

## 2019-02-21 NOTE — Progress Notes (Signed)
Virtual Visit via Video Note The purpose of this virtual visit is to provide medical care while limiting exposure to the novel coronavirus.    Consent was obtained for video visit:  Yes Answered questions that patient had about telehealth interaction:  Yes I discussed the limitations, risks, security and privacy concerns of performing an evaluation and management service by telemedicine. I also discussed with the patient that there may be a patient responsible charge related to this service. The patient expressed understanding and agreed to proceed.  Pt location: Home Physician Location: office Name of referring provider:  [name], MD I connected with Melissa Gaines at patients initiation/request on 02/22/2019 at  9:50 AM EST by video enabled telemedicine application and verified that I am speaking with the correct person using two identifiers. Pt MRN:  [id number] Pt DOB:  [birth year] Video Participants:  Melissa Gaines;     History of Present Illness:  Melissa Gaines is a 48 year old woman with PTSD, anxiety, chronic dizziness, migraines, SLE andantiphospholipid antibody syndrome withhistory of PE on chronic anticoagulation who follows up for migraines.  UPDATE: She took 1 dose of Aimovig but thought it wasn't approved.  For that one month, she noted some improvement in headaches. Intensity:  severe Duration:  1 to several hours with sumatriptan Frequency:  Almost daily.  Rescue therapy: Extra-Strength Tylenol with 2 ASA 325mg  and sometimes cup of coffee (makes her jittery) Current NSAIDS:None Current analgesics:None Current triptans:sumatriptan 100mg  Current ergotamine:none Current anti-emetic:none Current muscle relaxants:none Current anti-anxiolytic:hydroxyzine Current sleep aide:none Current Antihypertensive medications:none Current Antidepressant medications:Paxil 40mg  Current Anticonvulsant  medications:topiramate 100mg (but she ran out) Current anti-CGRP:Aimovig 70mg  Current Vitamins/Herbal/Supplements:D, folic acid Current Antihistamines/Decongestants:none Other therapy:Icy-cold Hormone/birth control:none Other medications:Eliquis, methotrexate  Caffeine:No coffee. Pepsi daily. Alcohol:no Smoker:no Diet:4 glasses of water daily. Skips meals Exercise:no Depression:yes; Anxiety:yes Other pain:Joint pain, back pain, lumbar radicular pain Sleep hygiene:varies  HISTORY: Onset:Mild migraines in 1990s but worse since birth of son in 2010 Location:Left temple radiating down to back of head Quality:pounding, shocks Initial Intensity:Severe. Shedenies new headache, thunderclap headache  Aura:no Premonitory Phase:no Postdrome:no Associated symptoms:Photophobia, phonophobia, dizziness, blurred vision, stuttering speech.If severe, she has memory deficits such as difficulty spelling her name or remembering her social security number. Shedenies associated nausea, vomiting, unilateral numbness or weakness. Initial Duration:Varies. 1 hour to several hours. Last month, she had one that lasted 5 days. Initial Frequency:Daily Initial Frequency of abortive medication:ASA and Tylenol daily Triggers:  Unknown Relieving factors:  Resting in dark quiet room, cold packs Activity:aggravates  Past NSAIDS:ibuprofen, ASA 325mg  Past analgesics: Extra-strength Tylenol Past abortive triptans:rizatriptan  Past abortive ergotamine:none Past muscle relaxants:none Past anti-emetic:none Past antihypertensive medications:none Past antidepressant medications:none Past anticonvulsant medications:topiramate 100mg  Past anti-CGRP:none Past vitamins/Herbal/Supplements:none Past antihistamines/decongestants:none Other past therapies:none  Family history of headache:Mom  Of note, she was told she had a  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 However, she followed up with another neurologist in [place] who performed a repeat brain MRI and was told that it was gone.  Past Medical History: Past Medical History:  Diagnosis Date  . Anemia   . Anxiety   . Asthma   . Chronic pain   . Congenital hip dislocation   . GERD (gastroesophageal reflux disease)   . Iron deficiency anemia 04/14/2018  . Kidney mass 2017  . Migraine   . Osteoporosis   . Pathological dislocation of shoulder joint, bilateral    congential  . PTSD (post-traumatic stress  disorder)   . Sleep apnea   . Systemic lupus erythematosus ([place])     Medications: Outpatient Encounter Medications as of 02/22/2019  Medication Sig Note  . acetaminophen (TYLENOL) 500 MG tablet Take 500 mg by mouth every 6 (six) hours as needed for headache.   [name] Kitchen AIMOVIG 70 MG/ML SOAJ  10/27/2018: States only had one and insurance wouldn't cover - told patient if MD orders after virtual visit Monday office will start PA process on this med.  [name] Kitchen apixaban (ELIQUIS) 2.5 MG TABS tablet Take 1 tablet (2.5 mg total) by mouth 2 (two) times daily.   . cyanocobalamin (,VITAMIN B-12,) 1000 MCG/ML injection Inject 1 mL (1,000 mcg total) into the skin every 30 (thirty) days.   . ferrous sulfate 325 (65 FE) MG tablet Take 325 mg by mouth daily with breakfast.   . hydrOXYzine (VISTARIL) 50 MG capsule Take 1 capsule (50 mg total) by mouth at bedtime.   [name] Kitchen PARoxetine (PAXIL) 40 MG tablet Take 1 tablet (40 mg total) by mouth daily.   . SUMAtriptan (IMITREX) 100 MG tablet TAKE 1 TABLET AT EARLIEST ONSET OF HEADACHE, MAY REPEAT IN 2 HOURS MAX OF 2 TABLETS IN 24 HOURS   . SYRINGE/NEEDLE, DISP, 1 ML 25G X 5/8" 1 ML MISC Match with B12   . Vitamin D, Ergocalciferol, (DRISDOL) 50000 units CAPS capsule Take 1 capsule (50,000 Units total) by mouth every 7 (seven) days.    No facility-administered encounter medications on file as of 02/22/2019.     Allergies: Allergies  Allergen Reactions  . 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 Throat closes and short of breath.    Family History: Family History  Problem Relation Age of Onset  . Asthma Mother   . Liver cancer Mother   . Heart Problems Mother        heart attack and heart failure  . Kidney Stones Mother   . Osteoporosis Mother   . Stroke Mother   . Depression Mother   . Anxiety disorder Mother   . Kidney Stones Father   . Hernia Father   . Kidney disease Father   . Depression Sister   . Anxiety disorder Sister   . Asthma Sister   . Cancer Maternal Grandmother   . Cancer Paternal Grandmother   . Cancer Other     Social History: Social History   Socioeconomic History  . Marital status: Single    Spouse name: Not on file  . Number of children: 2  . Years of education: some high school  . Highest education level: 11th grade  Occupational History  . Occupation: Disabled   Tobacco Use  . Smoking status: Never Smoker  . Smokeless tobacco: Never Used  Substance and Sexual Activity  . Alcohol use: Not Currently    Comment: when she was 48 years old; none after having children   . Drug use: No  . Sexual activity: Not Currently    Partners: Male    Birth control/protection: Condom, Surgical  Other Topics Concern  . Not on file  Social History Narrative   Lives with her 2 children and dog here in [place].   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    She likes to spend time with her children and write poems.   Patient is dependent on sister, Melissa Gaines. Melissa Gaines runs her errands for her and helps her with her children.   Social Determinants of Health  Financial Resource Strain:   . Difficulty of Paying Living Expenses: Not on file  Food Insecurity:   . Worried About Charity fundraiser in the Last Year: Not on file  . Ran Out of Food in the Last Year: Not on file  Transportation Needs:   . Lack of Transportation  (Medical): Not on file  . Lack of Transportation (Non-Medical): Not on file  Physical Activity:   . Days of Exercise per Week: Not on file  . Minutes of Exercise per Session: Not on file  Stress:   . Feeling of Stress : Not on file  Social Connections: Unknown  . Frequency of Communication with Friends and Family: More than three times a week  . Frequency of Social Gatherings with Friends and Family: Once a week  . Attends Religious Services: Not on file  . Active Member of Clubs or Organizations: Not on file  . Attends Archivist Meetings: Not on file  . Marital Status: Not on file  Intimate Partner Violence:   . Fear of Current or Ex-Partner: Not on file  . Emotionally Abused: Not on file  . Physically Abused: Not on file  . Sexually Abused: Not on file    Observations/Objective:   Height 4\' 10"  (1.473 m), weight 220 lb (99.8 kg). No acute distress.  Alert and oriented.  Speech fluent and not dysarthric.  Language intact.  Eyes orthophoric on primary gaze.  Face symmetric.  Assessment and Plan:   Chronic migraine without aura, without status migrainosus, not intractable  1.  For preventative management, Aimovig 70mg  monthly.  It was approved.  Will send in prescription 2.  For abortive therapy, sumatriptan.  Thought about prescribing naproxen but will hold off as it may increase risk of bleeding (as she is on Eliquis) 3.  Limit use of pain relievers to no more than 2 days out of week to prevent risk of rebound or medication-overuse headache. 4.  Keep headache diary 5.  Exercise, hydration, caffeine cessation, sleep hygiene, monitor for and avoid triggers 6.  Consider:  magnesium citrate 400mg  daily, riboflavin 400mg  daily, and coenzyme Q10 100mg  three times daily 7. Follow up in 4 months.   Follow Up Instructions:    -I discussed the assessment and treatment plan with the patient. The patient was provided an opportunity to ask questions and all were answered. The  patient agreed with the plan and demonstrated an understanding of the instructions.   The patient was advised to call back or seek an in-person evaluation if the symptoms worsen or if the condition fails to improve as anticipated.   Dudley Major, DO

## 2019-02-22 ENCOUNTER — Telehealth (INDEPENDENT_AMBULATORY_CARE_PROVIDER_SITE_OTHER): Payer: Medicare Other | Admitting: Neurology

## 2019-02-22 ENCOUNTER — Other Ambulatory Visit: Payer: Self-pay

## 2019-02-22 ENCOUNTER — Encounter: Payer: Self-pay | Admitting: Neurology

## 2019-02-22 VITALS — Ht <= 58 in | Wt 220.0 lb

## 2019-02-22 DIAGNOSIS — G43709 Chronic migraine without aura, not intractable, without status migrainosus: Secondary | ICD-10-CM | POA: Diagnosis not present

## 2019-02-22 MED ORDER — NAPROXEN 500 MG PO TABS: 500.0000 mg | ORAL_TABLET | Freq: Two times a day (BID) | ORAL | 3 refills | Status: DC | PRN

## 2019-02-22 MED ORDER — AIMOVIG 70 MG/ML ~~LOC~~ SOAJ: 70.0000 mg | SUBCUTANEOUS | 11 refills | Status: DC

## 2019-02-23 ENCOUNTER — Other Ambulatory Visit: Payer: Self-pay

## 2019-02-23 ENCOUNTER — Inpatient Hospital Stay: Payer: Medicare Other | Attending: Nurse Practitioner

## 2019-02-23 VITALS — BP 109/66 | HR 76 | Temp 98.0°F | Resp 20

## 2019-02-23 DIAGNOSIS — E538 Deficiency of other specified B group vitamins: Secondary | ICD-10-CM | POA: Diagnosis not present

## 2019-02-23 DIAGNOSIS — Z79899 Other long term (current) drug therapy: Secondary | ICD-10-CM | POA: Diagnosis not present

## 2019-02-23 DIAGNOSIS — D508 Other iron deficiency anemias: Secondary | ICD-10-CM

## 2019-02-23 MED ORDER — CYANOCOBALAMIN 1000 MCG/ML IJ SOLN
1000.0000 ug | Freq: Once | INTRAMUSCULAR | Status: AC
  Administered 2019-02-23: 12:00:00 1000 ug via INTRAMUSCULAR

## 2019-02-23 NOTE — Patient Instructions (Signed)
Cyanocobalamin, Pyridoxine, and Folate What is this medicine? A multivitamin containing folic acid, vitamin B6, and vitamin B12. This medicine may be used for other purposes; ask your health care provider or pharmacist if you have questions. COMMON BRAND NAME(S): AllanFol RX, AllanTex, Av-Vite FB, B Complex with Folic Acid, ComBgen, FaBB, Folamin, Folastin, Folbalin, Folbee, Folbic, Folcaps, Folgard, Folgard RX, Folgard RX 2.2, Folplex, Folplex 2.2, Foltabs 800, Foltx, Homocysteine Formula, Niva-Fol, NuFol, TL Gard RX, Virt-Gard, Virt-Vite, Virt-Vite Forte, Vita-Respa What should I tell my health care provider before I take this medicine? They need to know if you have any of these conditions:  bleeding or clotting disorder  history of anemia of any type  other chronic health condition  an unusual or allergic reaction to vitamins, other medicines, foods, dyes, or preservatives  pregnant or trying to get pregnant  breast-feeding How should I use this medicine? Take by mouth with a glass of water. May take with food. Follow the directions on the prescription label. It is usually given once a day. Do not take your medicine more often than directed. Contact your pediatrician regarding the use of this medicine in children. Special care may be needed. Overdosage: If you think you have taken too much of this medicine contact a poison control center or emergency room at once. NOTE: This medicine is only for you. Do not share this medicine with others. What if I miss a dose? If you miss a dose, take it as soon as you can. If it is almost time for your next dose, take only that dose. Do not take double or extra doses. What may interact with this medicine?  levodopa This list may not describe all possible interactions. Give your health care provider a list of all the medicines, herbs, non-prescription drugs, or dietary supplements you use. Also tell them if you smoke, drink alcohol, or use illegal  drugs. Some items may interact with your medicine. What should I watch for while using this medicine? See your health care professional for regular checks on your progress. Remember that vitamin supplements do not replace the need for good nutrition from a balanced diet. What side effects may I notice from receiving this medicine? Side effects that you should report to your doctor or health care professional as soon as possible:  allergic reaction such as skin rash or difficulty breathing  vomiting Side effects that usually do not require medical attention (report to your doctor or health care professional if they continue or are bothersome):  nausea  stomach upset This list may not describe all possible side effects. Call your doctor for medical advice about side effects. You may report side effects to FDA at 1-800-FDA-1088. Where should I keep my medicine? Keep out of the reach of children. Most vitamins should be stored at controlled room temperature. Check your specific product directions. Protect from heat and moisture. Throw away any unused medicine after the expiration date. NOTE: This sheet is a summary. It may not cover all possible information. If you have questions about this medicine, talk to your doctor, pharmacist, or health care provider.  2020 Elsevier/Gold Standard (2007-04-15 00:59:55)  

## 2019-03-15 ENCOUNTER — Ambulatory Visit (INDEPENDENT_AMBULATORY_CARE_PROVIDER_SITE_OTHER): Payer: Medicare Other | Admitting: Psychiatry

## 2019-03-15 ENCOUNTER — Encounter (HOSPITAL_COMMUNITY): Payer: Self-pay | Admitting: Psychiatry

## 2019-03-15 ENCOUNTER — Other Ambulatory Visit: Payer: Self-pay

## 2019-03-15 DIAGNOSIS — F4310 Post-traumatic stress disorder, unspecified: Secondary | ICD-10-CM | POA: Diagnosis not present

## 2019-03-15 DIAGNOSIS — F331 Major depressive disorder, recurrent, moderate: Secondary | ICD-10-CM | POA: Diagnosis not present

## 2019-03-15 NOTE — Progress Notes (Signed)
Virtual Visit via Video Note  I connected with [name] on 03/15/19 at  4:00 PM EST by a video enabled telemedicine application and verified that I am speaking with the correct person using two identifiers.  Location: Patient: Melissa Gaines Provider: Lise Auer, LCSW   I discussed the limitations of evaluation and management by telemedicine and the availability of in person appointments. The patient expressed understanding and agreed to proceed.  History of Present Illness: MDD and PTSD   Observations/Objective: Counselor met with [name] for individual therapy via Webex. Counselor assessed MH symptoms and progress on treatment plan goals. [name] presents with mild depression and mild anxiety. [name] denied suicidal ideation or self-harm behaviors. [name] shared that she has been experiencing increasing pain and migraines that are impacting her functioning. She requested that the Counselor communicate with her provider, as she has concerns that due to her speech, language barrier and poor virtual connection, that he is not understanding her concerns and description of symptoms. Counselor took note of concerns, worked to alleviate her emotional response of confusion, frustration, anxiety, and plans to communicate needs with provider through EHR system. Counselor processed journal entries/reflections composed by [name], with themes of generational patterns, past traumas, grief, loss, current impacts and personal growth. Counselor praised [name] for her growing strength, confidence, positive self-esteem and self-worth. Counselor promoted future journal activity to explore her thoughts and feelings regarding PTSD symptoms as a healthy outlet and expression of thoughts and feelings. Counselor discussed discharge planning and upcoming appointments.   Assessment and Plan: Counselor will continue to meet with patient to address treatment plan goals. Patient will continue to follow  recommendations of providers and implement skills learned in session.  Follow Up Instructions: Counselor will send information for next session via Webex.     I discussed the assessment and treatment plan with the patient. The patient was provided an opportunity to ask questions and all were answered. The patient agreed with the plan and demonstrated an understanding of the instructions.   The patient was advised to call back or seek an in-person evaluation if the symptoms worsen or if the condition fails to improve as anticipated.  I provided 55 minutes of non-face-to-face time during this encounter.   Lise Auer, LCSW

## 2019-03-23 ENCOUNTER — Other Ambulatory Visit: Payer: Self-pay

## 2019-03-23 ENCOUNTER — Inpatient Hospital Stay: Payer: Medicare Other | Attending: Nurse Practitioner

## 2019-03-23 VITALS — BP 110/68 | HR 90 | Temp 97.9°F | Resp 20

## 2019-03-23 DIAGNOSIS — D508 Other iron deficiency anemias: Secondary | ICD-10-CM

## 2019-03-23 DIAGNOSIS — Z79899 Other long term (current) drug therapy: Secondary | ICD-10-CM | POA: Insufficient documentation

## 2019-03-23 DIAGNOSIS — E538 Deficiency of other specified B group vitamins: Secondary | ICD-10-CM | POA: Insufficient documentation

## 2019-03-23 MED ORDER — CYANOCOBALAMIN 1000 MCG/ML IJ SOLN
1000.0000 ug | Freq: Once | INTRAMUSCULAR | Status: AC
  Administered 2019-03-23: 12:00:00 1000 ug via INTRAMUSCULAR

## 2019-03-23 MED ORDER — CYANOCOBALAMIN 1000 MCG/ML IJ SOLN
INTRAMUSCULAR | Status: AC
  Filled 2019-03-23: qty 1

## 2019-03-23 NOTE — Patient Instructions (Signed)
Cyanocobalamin, Pyridoxine, and Folate What is this medicine? A multivitamin containing folic acid, vitamin B6, and vitamin B12. This medicine may be used for other purposes; ask your health care provider or pharmacist if you have questions. COMMON BRAND NAME(S): AllanFol RX, AllanTex, Av-Vite FB, B Complex with Folic Acid, ComBgen, FaBB, Folamin, Folastin, Folbalin, Folbee, Folbic, Folcaps, Folgard, Folgard RX, Folgard RX 2.2, Folplex, Folplex 2.2, Foltabs 800, Foltx, Homocysteine Formula, Niva-Fol, NuFol, TL Gard RX, Virt-Gard, Virt-Vite, Virt-Vite Forte, Vita-Respa What should I tell my health care provider before I take this medicine? They need to know if you have any of these conditions:  bleeding or clotting disorder  history of anemia of any type  other chronic health condition  an unusual or allergic reaction to vitamins, other medicines, foods, dyes, or preservatives  pregnant or trying to get pregnant  breast-feeding How should I use this medicine? Take by mouth with a glass of water. May take with food. Follow the directions on the prescription label. It is usually given once a day. Do not take your medicine more often than directed. Contact your pediatrician regarding the use of this medicine in children. Special care may be needed. Overdosage: If you think you have taken too much of this medicine contact a poison control center or emergency room at once. NOTE: This medicine is only for you. Do not share this medicine with others. What if I miss a dose? If you miss a dose, take it as soon as you can. If it is almost time for your next dose, take only that dose. Do not take double or extra doses. What may interact with this medicine?  levodopa This list may not describe all possible interactions. Give your health care provider a list of all the medicines, herbs, non-prescription drugs, or dietary supplements you use. Also tell them if you smoke, drink alcohol, or use illegal  drugs. Some items may interact with your medicine. What should I watch for while using this medicine? See your health care professional for regular checks on your progress. Remember that vitamin supplements do not replace the need for good nutrition from a balanced diet. What side effects may I notice from receiving this medicine? Side effects that you should report to your doctor or health care professional as soon as possible:  allergic reaction such as skin rash or difficulty breathing  vomiting Side effects that usually do not require medical attention (report to your doctor or health care professional if they continue or are bothersome):  nausea  stomach upset This list may not describe all possible side effects. Call your doctor for medical advice about side effects. You may report side effects to FDA at 1-800-FDA-1088. Where should I keep my medicine? Keep out of the reach of children. Most vitamins should be stored at controlled room temperature. Check your specific product directions. Protect from heat and moisture. Throw away any unused medicine after the expiration date. NOTE: This sheet is a summary. It may not cover all possible information. If you have questions about this medicine, talk to your doctor, pharmacist, or health care provider.  2020 Elsevier/Gold Standard (2007-04-15 00:59:55)  

## 2019-04-10 ENCOUNTER — Encounter (HOSPITAL_COMMUNITY): Payer: Self-pay | Admitting: Psychiatry

## 2019-04-10 ENCOUNTER — Ambulatory Visit (INDEPENDENT_AMBULATORY_CARE_PROVIDER_SITE_OTHER): Payer: Medicare Other | Admitting: Psychiatry

## 2019-04-10 ENCOUNTER — Other Ambulatory Visit: Payer: Self-pay

## 2019-04-10 DIAGNOSIS — F4310 Post-traumatic stress disorder, unspecified: Secondary | ICD-10-CM | POA: Diagnosis not present

## 2019-04-10 DIAGNOSIS — F331 Major depressive disorder, recurrent, moderate: Secondary | ICD-10-CM
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
## 2019-04-29 NOTE — Progress Notes (Signed)
[place]   [phone] [phone]   Clinic Follow up Note   Patient Care Team: [name], MD as PCP - Almond Lint, DO as Consulting Physician (Neurology) 04/30/2019  CHIEF COMPLAINT: F/u iron deficiency and B12 deficiency anemia   DIAGNOSIS LIST: 1. Anemia - on 12/19/2017 ferritin 6 serum iron 19 TIBC 478, 4% transferrin saturation Hgb 12.2 consistent with iron deficiency, began oral iron; ferritin on 04/14/18 <4, patient received IV Feraheme weekly x2 on 04/28/18 and 05/05/18   2. B12 on 10/13/27 low 164; pending GI eval. She reports history of B12 deficiency and injections while living in [place], none in last 3 years since relocating to [facility]. H/o gastric bypass surgery in 2014  3. 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 G2P2  4. SLE - presented with butterfly rash in 2005, per historical note she was treated with plaquenil and prednisone but changed to plaquenil and methotrexate due to drug AE's. Positive ANA titer on 11/15/2016; lupus anticoagulant positive on 07/08/17, 07/22/17, and 05/01/18. The antiphospholipid antibody IgG 46.1 (<15); antiphospholipid antibody IgM 13.4 (<15); antiphospholipid antibody IgA 21(<15);beta-2 glycoprotein 1 antibody IgM19.4 (<15); beta-2 glycoprotein antibody IgG G 50.2 (<15).Followed by Rheumatologist Dr. Gerilyn Nestle at [facility]  5. Hypercoagulation work up: On 12/19/2017 showed slightly elevated protein C activity to 181% (range 73-180%) and elevated factor VIII assay to 223%, elevated beta-2 glycoprotein IgG to 27, and elevated cardiolipin antibodies IgG, IgM, and IgA; otherwise hemoglobinopathy evaluation, Antithrombin III, protein S activity; factor V Leiden, and prothrombin gene were normal   CURRENT THERAPY: 1. IV Feraheme 510 mg 04/28/18, 05/05/18, 11/02/2018 2. Oral ferrous sulfate, takes 1-3 tabs daily  3.  Apixaban 2.5 mg BID  4. B12 injection 1000 mcg monthly starting 10/2018    INTERVAL HISTORY: Ms. Acie Fredrickson returns for f/u as scheduled. She was last seen 10/2018 and received IV Feraheme on 11/02/18. She takes oral iron TID usually. She continues B12 injections here. She feels OK, no changes in health since last f/u. She continues to have headaches. She has less energy because she is home more. Not working. Menses are becoming lighter and irregular, might skip a month here and there. Denies other bleeding. Has dyspnea when allergies are bad, otherwise none and no cough or chest pain. Denies recent fever, chills. Has ringing in her ears. Right leg soreness is at baseline.    MEDICAL HISTORY:  Past Medical History:  Diagnosis Date  . Anemia   . Anxiety   . Asthma   . Chronic pain   . Congenital hip dislocation   . GERD (gastroesophageal reflux disease)   . Iron deficiency anemia 04/14/2018  . Kidney mass 2017  . Migraine   . Osteoporosis   . Pathological dislocation of shoulder joint, bilateral    congential  . PTSD (post-traumatic stress disorder)   . Sleep apnea   . Systemic lupus erythematosus ([place])     SURGICAL HISTORY: Past Surgical History:  Procedure Laterality Date  . CESAREAN SECTION  2005  . CESAREAN SECTION W/BTL  2010  . HIP SURGERY     in childhood for congenital hip dislocation  . [place] RESECTION  2014  . STAPEDECTOMY  11/01/2011   L postauricular stapedectomy with CO2 laser and insertion of 6 x 4.75 mm SMart Piston  . TOTAL HIP ARTHROPLASTY     05/2000 R, 11/2000 L    I  have reviewed the social history and family history with the patient and they are unchanged from previous note.  ALLERGIES:  is allergic to fish allergy.  MEDICATIONS:  Current Outpatient Medications  Medication Sig Dispense Refill  . acetaminophen (TYLENOL) 500 MG tablet Take 500 mg by mouth every 6 (six) hours as needed for headache.    [name] Kitchen apixaban (ELIQUIS) 2.5 MG TABS  tablet Take 1 tablet (2.5 mg total) by mouth 2 (two) times daily. 60 tablet 2  . cyanocobalamin (,VITAMIN B-12,) 1000 MCG/ML injection Inject 1 mL (1,000 mcg total) into the skin every 30 (thirty) days. 5 mL 0  . Erenumab-aooe (AIMOVIG) 70 MG/ML SOAJ Inject 70 mg into the skin every 30 (thirty) days. 1 pen 11  . ferrous sulfate 325 (65 FE) MG tablet Take 325 mg by mouth daily with breakfast.    . hydrOXYzine (VISTARIL) 50 MG capsule Take 1 capsule (50 mg total) by mouth at bedtime. 90 capsule 0  . naproxen (NAPROSYN) 500 MG tablet Take 500 mg by mouth 2 (two) times daily.    [name] Kitchen omeprazole (PRILOSEC) 40 MG capsule Take 40 mg by mouth daily.    [name] Kitchen PARoxetine (PAXIL) 40 MG tablet Take 1 tablet (40 mg total) by mouth daily. 90 tablet 0  . SUMAtriptan (IMITREX) 100 MG tablet TAKE 1 TABLET AT EARLIEST ONSET OF HEADACHE, MAY REPEAT IN 2 HOURS MAX OF 2 TABLETS IN 24 HOURS 9 tablet 3  . SYRINGE/NEEDLE, DISP, 1 ML 25G X 5/8" 1 ML MISC Match with B12 3 each 3  . Vitamin D, Ergocalciferol, (DRISDOL) 50000 units CAPS capsule Take 1 capsule (50,000 Units total) by mouth every 7 (seven) days. 8 capsule 0   No current facility-administered medications for this visit.    PHYSICAL EXAMINATION:  Vitals:   04/30/19 1052  BP: 96/65  Pulse: 85  Resp: 18  Temp: 98 F (36.7 C)  SpO2: 97%   Filed Weights   04/30/19 1052  Weight: 227 lb 12.8 oz (103.3 kg)    GENERAL:alert, no distress and comfortable SKIN: no rash  EYES:  sclera clear NECK: without mass LUNGS: clear with normal breathing effort HEART: regular rate & rhythm, no lower extremity edema NEURO: alert & oriented x 3 with fluent speech, normal gait   LABORATORY DATA:  I have reviewed the data as listed CBC Latest Ref Rng & Units 04/30/2019 01/26/2019 10/13/2018  WBC 4.0 - 10.5 K/uL 7.5 7.1 7.0  Hemoglobin 12.0 - 15.0 g/dL 14.1 15.3(H) 13.4  Hematocrit 36.0 - 46.0 % 42.8 46.6(H) 42.0  Platelets 150 - 400 K/uL 303 307 317     CMP Latest Ref  Rng & Units 04/30/2019 01/26/2019 10/13/2018  Glucose 70 - 99 mg/dL 91 86 111(H)  BUN 6 - 20 mg/dL 7 7 6   Creatinine 0.44 - 1.00 mg/dL 0.61 0.64 0.60  Sodium 135 - 145 mmol/L 140 139 137  Potassium 3.5 - 5.1 mmol/L 3.9 4.4 4.0  Chloride 98 - 111 mmol/L 105 103 105  CO2 22 - 32 mmol/L 24 24 21(L)  Calcium 8.9 - 10.3 mg/dL 8.7(L) 9.0 9.0  Total Protein 6.5 - 8.1 g/dL 7.6 8.2(H) 7.9  Total Bilirubin 0.3 - 1.2 mg/dL 0.2(L) 0.3 <0.2(L)  Alkaline Phos 38 - 126 U/L 58 71 69  AST 15 - 41 U/L 13(L) 15 12(L)  ALT 0 - 44 U/L 15 16 11       RADIOGRAPHIC STUDIES: I have personally reviewed the radiological images as listed and agreed with  the findings in the report. No results found.   ASSESSMENT & PLAN: 49 yo female with   1. 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 She initially responded well, but ferritin on 10/13/18 down to 16 and she became symptomatic with fatigue. Received IV Feraheme x1 on 11/02/2018 -Iron has been adequate in last 6 months.  -Ms. Almeyda appears stable today. Menses have become lighter and irregular, she is likely pre/perimenopausal -labs reviewed, normal CBC. Iron in low-normal range. Ferritin 24 today. We will arrange for her to have IV Feraheme x1 in the next week to keep ferritin >50. Continue oral iron -she will return for lab in 3 and 6 weeks, f/u in 6 months or sooner if needed  -we reviewed signs of recurrent anemia, she will call us if symptoms of anemia worsen. Will arrange IV Feraheme PRN   2. B12 deficiency, secondary to previous gastric bypass surgery - 10/13/18 B12 level decreased to 164 with signs of peripheral neuropathy.  -she began monthly B12 inj in 10/2018, her level is stable around 220 -continue monthly injections at [facility],  her insurance does not cover home administration  3. SLE  - followed by Rheum at [facility]  4. Antiphospholipid syndrome  -continue eliquis, tolerating well without bleeding -Refills per PCP   5. Migraines -followed by Neurology   PLAN: -Continue monthly B12 inj -IV Feraheme in the next week for ferritin 24, goal >50 -Continue oral iron  -Lab in 3 and 6 months -F/u in 6 months   No problem-specific Assessment & Plan notes found for this encounter.   No orders of the defined types were placed in this encounter.  All questions were answered. The patient knows to call the clinic with any problems, questions or concerns. No barriers to learning was detected.     Alla Feeling, NP 04/30/19

## 2019-04-30 ENCOUNTER — Encounter: Payer: Self-pay | Admitting: Nurse Practitioner

## 2019-04-30 ENCOUNTER — Inpatient Hospital Stay: Payer: Medicare Other

## 2019-04-30 ENCOUNTER — Inpatient Hospital Stay: Payer: Medicare Other | Attending: Nurse Practitioner

## 2019-04-30 ENCOUNTER — Inpatient Hospital Stay (HOSPITAL_BASED_OUTPATIENT_CLINIC_OR_DEPARTMENT_OTHER): Payer: Medicare Other | Admitting: Nurse Practitioner

## 2019-04-30 ENCOUNTER — Other Ambulatory Visit: Payer: Self-pay

## 2019-04-30 VITALS — BP 96/65 | HR 85 | Temp 98.0°F | Resp 18 | Ht <= 58 in | Wt 227.8 lb

## 2019-04-30 DIAGNOSIS — D500 Iron deficiency anemia secondary to blood loss (chronic): Secondary | ICD-10-CM

## 2019-04-30 DIAGNOSIS — D508 Other iron deficiency anemias: Secondary | ICD-10-CM

## 2019-04-30 DIAGNOSIS — D6861 Antiphospholipid syndrome: Secondary | ICD-10-CM | POA: Diagnosis not present

## 2019-04-30 DIAGNOSIS — R7989 Other specified abnormal findings of blood chemistry: Secondary | ICD-10-CM | POA: Diagnosis not present

## 2019-04-30 DIAGNOSIS — E538 Deficiency of other specified B group vitamins: Secondary | ICD-10-CM | POA: Diagnosis not present

## 2019-04-30 DIAGNOSIS — M329 Systemic lupus erythematosus, unspecified: Secondary | ICD-10-CM | POA: Insufficient documentation

## 2019-04-30 DIAGNOSIS — F4310 Post-traumatic stress disorder, unspecified: Secondary | ICD-10-CM | POA: Insufficient documentation

## 2019-04-30 DIAGNOSIS — G43909 Migraine, unspecified, not intractable, without status migrainosus: Secondary | ICD-10-CM | POA: Diagnosis not present

## 2019-04-30 DIAGNOSIS — G4730 Sleep apnea, unspecified: Secondary | ICD-10-CM | POA: Diagnosis not present

## 2019-04-30 DIAGNOSIS — R51 Headache with orthostatic component, not elsewhere classified: Secondary | ICD-10-CM | POA: Insufficient documentation

## 2019-04-30 DIAGNOSIS — Z7901 Long term (current) use of anticoagulants: Secondary | ICD-10-CM | POA: Insufficient documentation

## 2019-04-30 DIAGNOSIS — K219 Gastro-esophageal reflux disease without esophagitis: Secondary | ICD-10-CM | POA: Diagnosis not present

## 2019-04-30 DIAGNOSIS — J45909 Unspecified asthma, uncomplicated: Secondary | ICD-10-CM | POA: Diagnosis not present

## 2019-04-30 DIAGNOSIS — M810 Age-related osteoporosis without current pathological fracture: Secondary | ICD-10-CM | POA: Insufficient documentation

## 2019-04-30 DIAGNOSIS — D509 Iron deficiency anemia, unspecified: Secondary | ICD-10-CM | POA: Diagnosis not present

## 2019-04-30 DIAGNOSIS — Z9884 Bariatric surgery status: Secondary | ICD-10-CM | POA: Insufficient documentation

## 2019-04-30 DIAGNOSIS — Z79899 Other long term (current) drug therapy: Secondary | ICD-10-CM | POA: Insufficient documentation

## 2019-04-30 LAB — CMP (CANCER CENTER ONLY)
ALT: 15 U/L (ref 0–44)
AST: 13 U/L — ABNORMAL LOW (ref 15–41)
Albumin: 3.6 g/dL (ref 3.5–5.0)
Alkaline Phosphatase: 58 U/L (ref 38–126)
Anion gap: 11 (ref 5–15)
BUN: 7 mg/dL (ref 6–20)
CO2: 24 mmol/L (ref 22–32)
Calcium: 8.7 mg/dL — ABNORMAL LOW (ref 8.9–10.3)
Chloride: 105 mmol/L (ref 98–111)
Creatinine: 0.61 mg/dL (ref 0.44–1.00)
GFR, Est AFR Am: >60 mL/min (ref >60)
GFR, Estimated: >60 mL/min (ref >60)
Glucose, Bld: 91 mg/dL (ref 70–99)
Potassium: 3.9 mmol/L (ref 3.5–5.1)
Sodium: 140 mmol/L (ref 135–145)
Total Bilirubin: 0.2 mg/dL — ABNORMAL LOW (ref 0.3–1.2)
Total Protein: 7.6 g/dL (ref 6.5–8.1)

## 2019-04-30 LAB — IRON AND TIBC
Iron: 42 ug/dL (ref 41–142)
Saturation Ratios: 10 % — ABNORMAL LOW (ref 21–57)
TIBC: 409 ug/dL (ref 236–444)
UIBC: 367 ug/dL (ref 120–384)

## 2019-04-30 LAB — CBC WITH DIFFERENTIAL (CANCER CENTER ONLY)
Abs Immature Granulocytes: 0.04 10*3/uL (ref 0.00–0.07)
Basophils Absolute: 0 10*3/uL (ref 0.0–0.1)
Basophils Relative: 1 %
Eosinophils Absolute: 0.3 10*3/uL (ref 0.0–0.5)
Eosinophils Relative: 4 %
HCT: 42.8 % (ref 36.0–46.0)
Hemoglobin: 14.1 g/dL (ref 12.0–15.0)
Immature Granulocytes: 1 %
Lymphocytes Relative: 29 %
Lymphs Abs: 2.2 10*3/uL (ref 0.7–4.0)
MCH: 28.8 pg (ref 26.0–34.0)
MCHC: 32.9 g/dL (ref 30.0–36.0)
MCV: 87.3 fL (ref 80.0–100.0)
Monocytes Absolute: 0.4 10*3/uL (ref 0.1–1.0)
Monocytes Relative: 5 %
Neutro Abs: 4.6 10*3/uL (ref 1.7–7.7)
Neutrophils Relative %: 60 %
Platelet Count: 303 10*3/uL (ref 150–400)
RBC: 4.9 MIL/uL (ref 3.87–5.11)
RDW: 14.1 % (ref 11.5–15.5)
WBC Count: 7.5 10*3/uL (ref 4.0–10.5)
nRBC: 0 % (ref 0.0–0.2)

## 2019-04-30 LAB — FERRITIN: Ferritin: 24 ng/mL (ref 11–307)

## 2019-04-30 LAB — VITAMIN B12: Vitamin B-12: 218 pg/mL (ref 180–914)

## 2019-04-30 MED ORDER — CYANOCOBALAMIN 1000 MCG/ML IJ SOLN
1000.0000 ug | Freq: Once | INTRAMUSCULAR | Status: AC
  Administered 2019-04-30: 1000 ug via INTRAMUSCULAR

## 2019-04-30 MED ORDER — CYANOCOBALAMIN 1000 MCG/ML IJ SOLN
INTRAMUSCULAR | Status: AC
  Filled 2019-04-30: qty 1

## 2019-04-30 NOTE — Patient Instructions (Signed)

## 2019-05-01 ENCOUNTER — Telehealth: Payer: Self-pay | Admitting: Nurse Practitioner

## 2019-05-01 ENCOUNTER — Ambulatory Visit (INDEPENDENT_AMBULATORY_CARE_PROVIDER_SITE_OTHER): Payer: Medicare Other | Admitting: Psychiatry

## 2019-05-01 ENCOUNTER — Encounter (HOSPITAL_COMMUNITY): Payer: Self-pay | Admitting: Psychiatry

## 2019-05-01 DIAGNOSIS — F4310 Post-traumatic stress disorder, unspecified: Secondary | ICD-10-CM | POA: Diagnosis not present

## 2019-05-01 DIAGNOSIS — F331 Major depressive disorder, recurrent, moderate: Secondary | ICD-10-CM

## 2019-05-01 NOTE — Progress Notes (Signed)
Virtual Visit via Video Note  I connected with Melissa Gaines on 05/01/19 at  2:00 PM EST by a video enabled telemedicine application and verified that I am speaking with the correct person using two identifiers.  Location: Patient: Patient Home Provider: Home Office   I discussed the limitations of evaluation and management by telemedicine and the availability of in person appointments. The patient expressed understanding and agreed to proceed.  History of Present Illness: PTSD and MDD  Treatment Plan Goals: Will work to decrease PTSD symptoms and improve relationship with her children.   Observations/Objective: Counselor met with Melissa Gaines for individual therapy via Webex. Counselor assessed MH symptoms and progress on treatment plan goals, with patient reporting improvement in setting structure and boundaries with children, as well as, attempting family bonding activities not involving screen time. Melissa Gaines presents with mild depression and mild anxiety. Melissa Gaines denied suicidal ideation or self-harm behaviors.   Melissa Gaines shared that she continues to have chronic pain and issues related to her autoimmune diseases and it looking forward to her upcoming appointment with her specialist to address concerns. Counselor assessed how her psychical health is impacting her mental health and caregiver capacities. Melissa Gaines noted that since the last session she has been implementing strategies for more structure, since her son has returned to in-person school, and in bonding activities with her son and her daughter. Melissa Gaines noted that cooking was something that helped her to bond with her mother and sisters, so she has been inviting and encouraging her children to join her in the kitchen to learn cooking skills and traditional family recipes from her Melissa Gaines. Melissa Gaines reports that her children are resistant and "lazy", but have been reluctantly joining which makes her happy. Counselor assessed self-care  practices, with Melissa Gaines noting that she is having more alone time, better rest and time to attend to daily hygiene practices, now that her son is in school, because he is not constantly with her, invading her space in her room or in need of help with class work.    Counselor assessed ongoing need for therapy, with Melissa Gaines stating that discharging today was ok and that she would like to reconvene with services upon the Counselor's return from [place]. Counselor to contact [place] upon return to assess therapeutic needs at that time. Counselor to send additional information about community resources and access to services, if needed. Melissa Gaines satisfied with discharge plan.   Assessment and Plan: Patient will continue to follow recommendations of providers and implement skills learned in session. Patient will contact clinic if additional therapy services are need while Counselor is on Leave.   Follow Up Instructions: Counselor will send information about local community resources.    The patient was advised to call back or seek an in-person evaluation if the symptoms worsen or if the condition fails to improve as anticipated.  I provided 50 minutes of non-face-to-face time during this encounter.   Lise Auer, LCSW

## 2019-05-01 NOTE — Telephone Encounter (Signed)
Scheduled appt per 2/22 los.  Spoke with pt and she is aware of the appt date and time.

## 2019-05-07 DIAGNOSIS — R760 Raised antibody titer: Secondary | ICD-10-CM | POA: Diagnosis not present

## 2019-05-07 DIAGNOSIS — Z79899 Other long term (current) drug therapy: Secondary | ICD-10-CM | POA: Diagnosis not present

## 2019-05-07 DIAGNOSIS — M329 Systemic lupus erythematosus, unspecified: Secondary | ICD-10-CM | POA: Diagnosis not present

## 2019-05-10 ENCOUNTER — Inpatient Hospital Stay: Payer: Medicare Other

## 2019-05-15 ENCOUNTER — Other Ambulatory Visit: Payer: Self-pay

## 2019-05-15 ENCOUNTER — Encounter (HOSPITAL_COMMUNITY): Payer: Self-pay | Admitting: Psychiatry

## 2019-05-15 ENCOUNTER — Inpatient Hospital Stay: Payer: Medicare Other | Attending: Nurse Practitioner

## 2019-05-15 ENCOUNTER — Ambulatory Visit (INDEPENDENT_AMBULATORY_CARE_PROVIDER_SITE_OTHER): Payer: Medicare Other | Admitting: Psychiatry

## 2019-05-15 VITALS — BP 112/73 | HR 97 | Temp 98.2°F | Resp 16

## 2019-05-15 DIAGNOSIS — D509 Iron deficiency anemia, unspecified: Secondary | ICD-10-CM | POA: Diagnosis not present

## 2019-05-15 DIAGNOSIS — F331 Major depressive disorder, recurrent, moderate: Secondary | ICD-10-CM | POA: Diagnosis not present

## 2019-05-15 DIAGNOSIS — Z79899 Other long term (current) drug therapy: Secondary | ICD-10-CM | POA: Diagnosis not present

## 2019-05-15 DIAGNOSIS — E538 Deficiency of other specified B group vitamins: Secondary | ICD-10-CM | POA: Insufficient documentation

## 2019-05-15 DIAGNOSIS — F4310 Post-traumatic stress disorder, unspecified: Secondary | ICD-10-CM

## 2019-05-15 DIAGNOSIS — D508 Other iron deficiency anemias: Secondary | ICD-10-CM

## 2019-05-15 MED ORDER — PAROXETINE HCL 40 MG PO TABS: 40.0000 mg | ORAL_TABLET | Freq: Every day | ORAL | 0 refills | Status: DC

## 2019-05-15 MED ORDER — SODIUM CHLORIDE 0.9 % IV SOLN
INTRAVENOUS | Status: DC
  Filled 2019-05-15: qty 250

## 2019-05-15 MED ORDER — SODIUM CHLORIDE 0.9 % IV SOLN
510.0000 mg | Freq: Once | INTRAVENOUS | Status: AC
  Administered 2019-05-15: 510 mg via INTRAVENOUS
  Filled 2019-05-15: qty 510

## 2019-05-15 MED ORDER — ACETAMINOPHEN 325 MG PO TABS
ORAL_TABLET | ORAL | Status: AC
  Filled 2019-05-15: qty 2

## 2019-05-15 MED ORDER — HYDROXYZINE PAMOATE 50 MG PO CAPS: 50.0000 mg | ORAL_CAPSULE | Freq: Every day | ORAL | 0 refills | Status: DC

## 2019-05-15 MED ORDER — ACETAMINOPHEN 325 MG PO TABS
650.0000 mg | ORAL_TABLET | Freq: Once | ORAL | Status: AC
  Administered 2019-05-15: 650 mg via ORAL

## 2019-05-15 NOTE — Patient Instructions (Signed)
Ferumoxytol injection (IV Iron) What is this medicine? FERUMOXYTOL is an iron complex. Iron is used to make healthy red blood cells, which carry oxygen and nutrients throughout the body. This medicine is used to treat iron deficiency anemia. This medicine may be used for other purposes; ask your health care provider or pharmacist if you have questions. COMMON BRAND NAME(S): Feraheme What should I tell my health care provider before I take this medicine? They need to know if you have any of these conditions:  anemia not caused by low iron levels  high levels of iron in the blood  magnetic resonance imaging (MRI) test scheduled  an unusual or allergic reaction to iron, other medicines, foods, dyes, or preservatives  pregnant or trying to get pregnant  breast-feeding How should I use this medicine? This medicine is for injection into a vein. It is given by a health care professional in a hospital or clinic setting. Talk to your pediatrician regarding the use of this medicine in children. Special care may be needed. Overdosage: If you think you have taken too much of this medicine contact a poison control center or emergency room at once. NOTE: This medicine is only for you. Do not share this medicine with others. What if I miss a dose? It is important not to miss your dose. Call your doctor or health care professional if you are unable to keep an appointment. What may interact with this medicine? This medicine may interact with the following medications:  other iron products This list may not describe all possible interactions. Give your health care provider a list of all the medicines, herbs, non-prescription drugs, or dietary supplements you use. Also tell them if you smoke, drink alcohol, or use illegal drugs. Some items may interact with your medicine. What should I watch for while using this medicine? Visit your doctor or healthcare professional regularly. Tell your doctor or  healthcare professional if your symptoms do not start to get better or if they get worse. You may need blood work done while you are taking this medicine. You may need to follow a special diet. Talk to your doctor. Foods that contain iron include: whole grains/cereals, dried fruits, beans, or peas, leafy green vegetables, and organ meats (liver, kidney). What side effects may I notice from receiving this medicine? Side effects that you should report to your doctor or health care professional as soon as possible:  allergic reactions like skin rash, itching or hives, swelling of the face, lips, or tongue  breathing problems  changes in blood pressure  feeling faint or lightheaded, falls  fever or chills  flushing, sweating, or hot feelings  swelling of the ankles or feet Side effects that usually do not require medical attention (report to your doctor or health care professional if they continue or are bothersome):  diarrhea  headache  nausea, vomiting  stomach pain This list may not describe all possible side effects. Call your doctor for medical advice about side effects. You may report side effects to FDA at 1-800-FDA-1088. Where should I keep my medicine? This drug is given in a hospital or clinic and will not be stored at home. NOTE: This sheet is a summary. It may not cover all possible information. If you have questions about this medicine, talk to your doctor, pharmacist, or health care provider.  2020 Elsevier/Gold Standard (2016-04-12 20:21:10)
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
## 2019-05-28 ENCOUNTER — Other Ambulatory Visit: Payer: Self-pay

## 2019-05-28 ENCOUNTER — Inpatient Hospital Stay: Payer: Medicare Other

## 2019-05-28 VITALS — BP 141/98 | HR 99 | Temp 98.0°F | Resp 18

## 2019-05-28 DIAGNOSIS — D508 Other iron deficiency anemias: Secondary | ICD-10-CM

## 2019-05-28 DIAGNOSIS — D509 Iron deficiency anemia, unspecified: Secondary | ICD-10-CM | POA: Diagnosis not present

## 2019-05-28 DIAGNOSIS — E538 Deficiency of other specified B group vitamins: Secondary | ICD-10-CM | POA: Diagnosis not present

## 2019-05-28 DIAGNOSIS — Z79899 Other long term (current) drug therapy: Secondary | ICD-10-CM | POA: Diagnosis not present

## 2019-05-28 MED ORDER — CYANOCOBALAMIN 1000 MCG/ML IJ SOLN
1000.0000 ug | Freq: Once | INTRAMUSCULAR | Status: AC
  Administered 2019-05-28: 1000 ug via INTRAMUSCULAR

## 2019-05-28 NOTE — Patient Instructions (Signed)

## 2019-06-21 DIAGNOSIS — Z79899 Other long term (current) drug therapy: Secondary | ICD-10-CM | POA: Diagnosis not present

## 2019-06-21 DIAGNOSIS — R760 Raised antibody titer: Secondary | ICD-10-CM | POA: Diagnosis not present

## 2019-06-21 DIAGNOSIS — M329 Systemic lupus erythematosus, unspecified: Secondary | ICD-10-CM | POA: Diagnosis not present

## 2019-06-25 ENCOUNTER — Encounter: Payer: Self-pay | Admitting: Family Medicine

## 2019-06-25 ENCOUNTER — Ambulatory Visit (INDEPENDENT_AMBULATORY_CARE_PROVIDER_SITE_OTHER): Payer: Medicare Other | Admitting: Family Medicine

## 2019-06-25 ENCOUNTER — Other Ambulatory Visit: Payer: Self-pay

## 2019-06-25 VITALS — BP 110/62 | HR 93 | Ht <= 58 in | Wt 226.0 lb

## 2019-06-25 DIAGNOSIS — Z13220 Encounter for screening for lipoid disorders: Secondary | ICD-10-CM

## 2019-06-25 DIAGNOSIS — Z131 Encounter for screening for diabetes mellitus: Secondary | ICD-10-CM

## 2019-06-25 DIAGNOSIS — S86912A Strain of unspecified muscle(s) and tendon(s) at lower leg level, left leg, initial encounter: Secondary | ICD-10-CM

## 2019-06-25 DIAGNOSIS — Z0000 Encounter for general adult medical examination without abnormal findings: Secondary | ICD-10-CM

## 2019-06-25 DIAGNOSIS — M25559 Pain in unspecified hip: Secondary | ICD-10-CM | POA: Diagnosis not present

## 2019-06-25 DIAGNOSIS — D6861 Antiphospholipid syndrome: Secondary | ICD-10-CM | POA: Diagnosis not present

## 2019-06-25 DIAGNOSIS — E782 Mixed hyperlipidemia: Secondary | ICD-10-CM | POA: Diagnosis not present

## 2019-06-25 DIAGNOSIS — M25552 Pain in left hip: Secondary | ICD-10-CM

## 2019-06-25 DIAGNOSIS — M329 Systemic lupus erythematosus, unspecified: Secondary | ICD-10-CM | POA: Diagnosis not present

## 2019-06-25 LAB — POCT GLYCOSYLATED HEMOGLOBIN (HGB A1C): HbA1c, POC (prediabetic range): 5.7 % (ref 5.7–6.4)

## 2019-06-25 NOTE — Patient Instructions (Signed)
It was a pleasure to see you today.  I checked your blood for cholesterol and screened for diabetes, if anything is abnormal, I will give you a call back.  I have referred you to physical therapy and weight management.  You need a pap smear later this year (October) please schedule that later this year.  Be Well!  Dr. Chauncey Reading

## 2019-06-26 DIAGNOSIS — M25552 Pain in left hip: Secondary | ICD-10-CM | POA: Insufficient documentation

## 2019-06-26 DIAGNOSIS — Z0000 Encounter for general adult medical examination without abnormal findings: Secondary | ICD-10-CM | POA: Insufficient documentation

## 2019-06-26 DIAGNOSIS — S86912A Strain of unspecified muscle(s) and tendon(s) at lower leg level, left leg, initial encounter: Secondary | ICD-10-CM | POA: Insufficient documentation

## 2019-06-26 DIAGNOSIS — Z1211 Encounter for screening for malignant neoplasm of colon: Secondary | ICD-10-CM | POA: Insufficient documentation

## 2019-06-26 LAB — LIPID PANEL
Chol/HDL Ratio: 4.5 ratio — ABNORMAL HIGH (ref 0.0–4.4)
Cholesterol, Total: 208 mg/dL — ABNORMAL HIGH (ref 100–199)
HDL: 46 mg/dL (ref >39)
LDL Chol Calc (NIH): 129 mg/dL — ABNORMAL HIGH (ref 0–99)
Triglycerides: 188 mg/dL — ABNORMAL HIGH (ref 0–149)
VLDL Cholesterol Cal: 33 mg/dL (ref 5–40)

## 2019-06-26 NOTE — Assessment & Plan Note (Signed)
Patient is distressed about difficulty losing weight. Has attempted multiple times and would like referral to assist in weight management.  - [facility]erral to [facility]

## 2019-06-26 NOTE — Assessment & Plan Note (Signed)
-   Recommend patient return in the fall for routine pap smear - Lipid panel and Hgb A1c obtained to screen for hyperlipidemia and diabetes mellitus. - Recommend COVID-19 vaccine

## 2019-06-26 NOTE — Assessment & Plan Note (Signed)
Patient with hip pain and diminished ROM on L side s/p bilateral hip arthroplasties. Patient with difficulty walking due to pain, requests referral to PT. If no improvement, recommend repeat imaging and referral to orthopedics. - Referral to PT

## 2019-06-26 NOTE — Assessment & Plan Note (Signed)
Recommend rest, gentle stretching and heat. Will also be benefitted by PT. Injury 1 week ago, expect continued improvement.

## 2019-06-26 NOTE — Progress Notes (Signed)
SUBJECTIVE:   CHIEF COMPLAINT / HPI: annual exam  L Hip pain: Patient is having worsening pain in her L hip. She has a h/o bilateral hip replacements, but has recently noticed that she cannot lift her L hip without pain. She did have an incident about a week ago where she slipped on water in the bathroom, but she did not fall, just ended up with leg extension and has pain in her medial thigh, likely due to adductor muscle strain. The hip pain she is currently having predates this incident. She finds it difficult to walk and exercise because of the left hip pain. A MRI from 2018 showed L5-S1 disc bulging as well as psoas muscle abnormalities related to hip replacements.   Weight management: Patient requests referral for help with weight management as she knows her weight is having an effect on her overall health including her joints. She reports numerous tries to lose weight without success in the past.   SLE/APS: Patient sees Dr. 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 Patient is on apixaban, and recent lab work CMP and CBC were WNL.  Healthcare Maintenance: Patient will be due for a pap smear in October of this year, reminded her to come back in the fall to schedule this procedure. Otherwise, she is UTD on mammogram, flu vaccine and tetanus. She has not yet gotten the COVID-19 vaccine, and she is hesitant due to reports of blood clots with J&J vaccine, which is understandable given her history. We discussed how no blood clots have been reported with the two other vaccines being administered, Pfizer and Moderna, and that COVID-19 is also more likely to cause blood clots if contracted than the vaccine.  PERTINENT  PMH / PSH: APS, SLE, bilateral hip replacements, morbid obesity  OBJECTIVE:   BP 110/62   Pulse 93   Ht 4\' 10"  (1.473 m)   Wt 226 lb (102.5 kg)   BMI 47.23 kg/m   Physical Exam Vitals and nursing note reviewed.  Constitutional:    General: She is not in acute distress.    Appearance: Normal appearance. She is obese. She is not ill-appearing, toxic-appearing or diaphoretic.  HENT:     Head: Normocephalic and atraumatic.     Nose: Nose normal.  Eyes:     Conjunctiva/sclera: Conjunctivae normal.  Cardiovascular:     Rate and Rhythm: Normal rate and regular rhythm.     Pulses: Normal pulses.     Heart sounds: Normal heart sounds. No murmur. No friction rub. No gallop.   Pulmonary:     Effort: Pulmonary effort is normal.     Breath sounds: Normal breath sounds. No wheezing, rhonchi or rales.  Abdominal:     General: Abdomen is flat. Bowel sounds are normal. There is no distension.     Palpations: Abdomen is soft.     Tenderness: There is no abdominal tenderness.  Musculoskeletal:        General: Signs of injury present. No swelling.     Right lower leg: No edema.     Left lower leg: No edema.     Comments: LLE: Tenderness over medial aspect of L thigh, no erythema, edema or ecchymosis. Limited ROM to ~20* by pain at hip, full ROM at knee and ankle. Normal internal and external rotation of hip.   RLE: no tenderness, erythema, edema or ecchymosis. Normal ROM at hip, knee, and ankle.   Neurological:  General: No focal deficit present.     Mental Status: She is alert and oriented to person, place, and time. Mental status is at baseline.     Gait: Gait abnormal.     Comments: Limping on left leg from hip pain and recent muscle strain  Psychiatric:        Mood and Affect: Mood normal.        Behavior: Behavior normal.    ASSESSMENT/PLAN:  Ms. Srushti Berman presents today for a physical with L hip pain and weight concerns.  Left hip pain Patient with hip pain and diminished ROM on L side s/p bilateral hip arthroplasties. Patient with difficulty walking due to pain, requests referral to PT. If no improvement, recommend repeat imaging and referral to orthopedics. - Referral to PT  Muscle strain of lower  extremity, left, initial encounter Recommend rest, gentle stretching and heat. Will also be benefitted by PT. Injury 1 week ago, expect continued improvement.  Healthcare maintenance - Recommend patient return in the fall for routine pap smear - Lipid panel and Hgb A1c obtained to screen for hyperlipidemia and diabetes mellitus. - Recommend COVID-19 vaccine  Morbid obesity ([place]) Patient is distressed about difficulty losing weight. Has attempted multiple times and would like referral to assist in weight management.  - Referral to [facility]    [name], [place]

## 2019-06-27 ENCOUNTER — Telehealth: Payer: Self-pay | Admitting: Family Medicine

## 2019-06-27 NOTE — Telephone Encounter (Signed)
Patient is calling and would like to know if she could have an antibiotic called in for a uti. She was seen at her rheumatologist and they said she had one but would have to get medication from pcp. Patient is not able to come in for an appointment due to transportation.   Please call patient to inform if an antibiotic will be sent in. Best call back [phone].

## 2019-06-27 NOTE — Telephone Encounter (Signed)
Will forward to MD to advise. Naheem Mosco,CMA  

## 2019-06-27 NOTE — Progress Notes (Signed)
NEUROLOGY FOLLOW UP OFFICE NOTE  Melissa Gaines FV:[id number]  HISTORY OF PRESENT ILLNESS: Melissa Gaines is a 49 year old woman with PTSD, anxiety, chronic dizziness, migraines, SLE andantiphospholipid antibody syndrome withhistory of PE on chronic anticoagulation whofollows up for migraines.  UPDATE: Started Aimovig but hasn't taken it this month (almost 3 weeks overdue).  She said the pharmacy didn't contact her to pick up prescription.  When she takes the [place], she reports feeling "electric shocks" in her arms or legs, lasting 20 seconds.  In March:  Still daily headache but only 2 or 3 severe headaches, lasting an hour but returns in 30 minutes and then repeats after 2 hours and lasts an hour but then returns 30 minutes later. Intensity:  severe Duration:  1 to several hours with sumatriptan Frequency:  Almost daily.  She reports increased electric shock pain in the left temple radiating to back of head, lasting 1 to hours with photophobia and phonophobia, occurring twice a month.  Current NSAIDS:None Current analgesics:None Current triptans:sumatriptan 100mg  Current ergotamine:none Current anti-emetic:none Current muscle relaxants:none Current anti-anxiolytic:hydroxyzine Current sleep aide:none Current Antihypertensive medications:none Current Antidepressant medications:Paxil 40mg  Current Anticonvulsant medications:none Current anti-CGRP:Aimovig 70mg  Current Vitamins/Herbal/Supplements:D, folic acid Current Antihistamines/Decongestants:none Other therapy:Icy-cold Hormone/birth control:none Other medications:Eliquis, methotrexate  Caffeine:No coffee. Pepsi daily. Alcohol:no Smoker:no Diet:4 glasses of water daily. Skips meals Exercise:no Depression:yes; Anxiety:yes Other pain:Joint pain, back pain, lumbar radicular pain Sleep hygiene:varies  HISTORY: Onset:Mild migraines  in1990sbut worse since birth of son in 2010 Location:Left temple radiating down to back of head Quality:pounding, shocks InitialIntensity:Severe. Shedenies new headache, thunderclap headache  Aura:no Premonitory Phase:no Postdrome:no Associated symptoms:Photophobia, phonophobia, dizziness, blurred vision, stuttering speech.If severe, she has memory deficits such as difficulty spelling her name or remembering her social security number. Shedenies associated nausea, vomiting, unilateral numbness or weakness. InitialDuration:Varies. 1 hour to several hours. Last month, she had one that lasted 5 days. InitialFrequency:Daily InitialFrequency of abortive medication:ASA and Tylenol daily Triggers:  Unknown Relieving factors:  Restingin dark quiet room, cold packs Activity:aggravates  Past NSAIDS:ibuprofen, ASA 325mg  Past analgesics:Extra-strength Tylenol Past abortive triptans:rizatriptan Past abortive ergotamine:none Past muscle relaxants:none Past anti-emetic:none Past antihypertensive medications:none Past antidepressant medications:none Past anticonvulsant medications:topiramate 100mg  Past anti-CGRP:none Past vitamins/Herbal/Supplements:none Past antihistamines/decongestants:none Other past therapies:none  Family history of headache:Mom  Of note, she was told she had a 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 However, she followed up with another neurologist in [place] who performed a repeat brain MRI and was told that it was gone.  PAST MEDICAL HISTORY: Past Medical History:  Diagnosis Date  . Anemia   . Anxiety   . Asthma   . Chronic pain   . Congenital hip dislocation   . GERD (gastroesophageal reflux disease)   . Iron deficiency anemia 04/14/2018  . Kidney mass 2017  . Migraine   . Osteoporosis   . Pathological dislocation of shoulder joint, bilateral    congential  . PTSD  (post-traumatic stress disorder)   . Sleep apnea   . Systemic lupus erythematosus ([place])     MEDICATIONS: Current Outpatient Medications on File Prior to Visit  Medication Sig Dispense Refill  . acetaminophen (TYLENOL) 500 MG tablet Take 500 mg by mouth every 6 (six) hours as needed for headache.    [name] Kitchen apixaban (ELIQUIS) 2.5 MG TABS tablet Take 1 tablet (2.5 mg total) by mouth 2 (two) times daily. 60 tablet 2  . cyanocobalamin (,VITAMIN B-12,) 1000 MCG/ML injection Inject 1 mL (1,000 mcg  total) into the skin every 30 (thirty) days. 5 mL 0  . Erenumab-aooe (AIMOVIG) 70 MG/ML SOAJ Inject 70 mg into the skin every 30 (thirty) days. 1 pen 11  . ferrous sulfate 325 (65 FE) MG tablet Take 325 mg by mouth daily with breakfast.    . hydrOXYzine (VISTARIL) 50 MG capsule Take 1 capsule (50 mg total) by mouth at bedtime. 90 capsule 0  . naproxen (NAPROSYN) 500 MG tablet Take 500 mg by mouth 2 (two) times daily.    [name] Kitchen omeprazole (PRILOSEC) 40 MG capsule Take 40 mg by mouth daily.    [name] Kitchen PARoxetine (PAXIL) 40 MG tablet Take 1 tablet (40 mg total) by mouth daily. 90 tablet 0  . SUMAtriptan (IMITREX) 100 MG tablet TAKE 1 TABLET AT EARLIEST ONSET OF HEADACHE, MAY REPEAT IN 2 HOURS MAX OF 2 TABLETS IN 24 HOURS 9 tablet 3  . SYRINGE/NEEDLE, DISP, 1 ML 25G X 5/8" 1 ML MISC Match with B12 3 each 3  . Vitamin D, Ergocalciferol, (DRISDOL) 50000 units CAPS capsule Take 1 capsule (50,000 Units total) by mouth every 7 (seven) days. 8 capsule 0   No current facility-administered medications on file prior to visit.    ALLERGIES: Allergies  Allergen Reactions  . 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 Throat closes and short of breath.    FAMILY HISTORY: Family History  Problem Relation Age of Onset  . Asthma Mother   . Liver cancer Mother   . Heart Problems Mother        heart attack and heart failure  . Kidney Stones Mother   . Osteoporosis Mother   . Stroke Mother   . Depression Mother   .  Anxiety disorder Mother   . Kidney Stones Father   . Hernia Father   . Kidney disease Father   . Depression Sister   . Anxiety disorder Sister   . Asthma Sister   . Cancer Maternal Grandmother   . Cancer Paternal Grandmother   . Cancer Other     SOCIAL HISTORY: Social History   Socioeconomic History  . Marital status: Single    Spouse name: Not on file  . Number of children: 2  . Years of education: some high school  . Highest education level: 11th grade  Occupational History  . Occupation: Disabled   Tobacco Use  . Smoking status: Never Smoker  . Smokeless tobacco: Never Used  Substance and Sexual Activity  . Alcohol use: Not Currently    Comment: when she was 49 years old; none after having children   . Drug use: No  . Sexual activity: Not Currently    Partners: Male    Birth control/protection: Condom, Surgical  Other Topics Concern  . Not on file  Social History Narrative   Lives with her 2 children and dog here in [place].   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    She likes to spend time with her children and write poems.   Patient lives in one story home, Right handed    Social Determinants of Health   Financial Resource Strain:   . Difficulty of Paying Living Expenses:   Food Insecurity:   . Worried About Charity fundraiser in the Last Year:   . Arboriculturist in the Last Year:   Transportation Needs:   . Film/video editor (Medical):   [name] Kitchen Lack of Transportation (Non-Medical):   Physical  Activity:   . Days of Exercise per Week:   . Minutes of Exercise per Session:   Stress:   . Feeling of Stress :   Social Connections: Unknown  . Frequency of Communication with Friends and Family: More than three times a week  . Frequency of Social Gatherings with Friends and Family: Once a week  . Attends Religious Services: Not on file  . Active Member of Clubs or Organizations: Not on file  . Attends Archivist  Meetings: Not on file  . Marital Status: Not on file  Intimate Partner Violence:   . Fear of Current or Ex-Partner:   . Emotionally Abused:   [name] Kitchen Physically Abused:   . Sexually Abused:     PHYSICAL EXAM: Blood pressure 134/83, pulse 83, height 4\' 10"  (1.473 m), weight 228 lb 6.4 oz (103.6 kg), SpO2 94 %. General: No acute distress.  Patient appears well-groomed.   Head:  Normocephalic/atraumatic Eyes:  Fundi examined but not visualized Neck: supple, left suboccipital and paraspinal tenderness, full range of motion Heart:  Regular rate and rhythm Lungs:  Clear to auscultation bilaterally Back: No paraspinal tenderness Neurological Exam: alert and oriented to person, place, and time. Attention span and concentration intact, recent and remote memory intact, fund of knowledge intact.  Speech fluent and not dysarthric, language intact.  Endorses vague altered sensation to light touch of left V2 distribution.  Otherwise, CN II-XII intact. Bulk and tone normal, muscle strength 5/5 throughout.  Sensation to light touch, temperature and vibration intact.  Deep tendon reflexes 2+ throughout, toes downgoing.  Finger to nose and heel to shin testing intact.  Gait normal, Romberg negative.  IMPRESSION: 1.  Migraine without aura, without status migrainosus, not intractable 2.  Left sided occipital neuralgia/upper cervical radiculopathy  Endorsed inconsistent vague altered light touch sensation in left cheek.  No objective findings or abnormalities on exam to suggest an intracranial lesion.  May be sequelae of headache/migraine  PLAN: 1.  For preventative management, continue Aimovig 70mg  monthly.  Advised to pick up prescription as it should be ready and to contact us if there is a problem. 2.  For abortive therapy, will have her try Ubrelvy 100mg  3. Start gabapentin 100mg  at bedtime, titrate to 300mg  at bedtime to address neuralgia, neck pain and headache. 4.  Limit use of pain relievers to no more  than 2 days out of week to prevent risk of rebound or medication-overuse headache. 5.  Keep headache diary 6.  Exercise, hydration, caffeine cessation, sleep hygiene, monitor for and avoid triggers 7. Follow up 4 months.   Melissa Clines, DO  CC: [name], MD

## 2019-06-28 ENCOUNTER — Encounter: Payer: Self-pay | Admitting: Family Medicine

## 2019-06-28 ENCOUNTER — Other Ambulatory Visit: Payer: Self-pay

## 2019-06-28 ENCOUNTER — Ambulatory Visit (INDEPENDENT_AMBULATORY_CARE_PROVIDER_SITE_OTHER): Payer: Medicare Other | Admitting: Neurology

## 2019-06-28 VITALS — BP 134/83 | HR 83 | Ht <= 58 in | Wt 228.4 lb

## 2019-06-28 DIAGNOSIS — M5481 Occipital neuralgia: Secondary | ICD-10-CM

## 2019-06-28 DIAGNOSIS — G43709 Chronic migraine without aura, not intractable, without status migrainosus: Secondary | ICD-10-CM

## 2019-06-28 MED ORDER — GABAPENTIN 100 MG PO CAPS: ORAL_CAPSULE | ORAL | 0 refills | Status: DC

## 2019-06-28 NOTE — Telephone Encounter (Signed)
Attempted to call patient back at best call back number, which confirmed number, but not individual. HIPAA compliant VM left. Before I can prescribe antibiotics to treat UTI empirically, I need to speak with patient regarding her symptoms. Please let me know if she calls back and I will attempt to speak with her again.  [name], MD [facility] [facility], PGY-1

## 2019-06-28 NOTE — Telephone Encounter (Signed)
Attempted to reach pt to get more information about the [place] she is having with possible UTI. LVM  for pt to call back to give me more details. Salvatore Marvel, CMA

## 2019-06-28 NOTE — Telephone Encounter (Signed)
Patient is returning Dr. Jerrol Banana phone call. I informed her of below. I was not able to get a hold of Dr. Chauncey Reading. Please call patient back to discuss.

## 2019-06-28 NOTE — Patient Instructions (Signed)
1.  Continue Aimovig 70mg  every 30 days.   2.  Stop sumatriptan.  Instead, try Ubrelvy 100mg .  Take 1 tablet for migraine.  May repeat in 2 hours if needed (maximum 2 tablets in 24 hours).  If effective, contact me and I will place a prescription 3.  To treat the nerve pain and neck pain, start gabapentin 100mg .  Take 1 capsule at bedtime for one week, then 2 capsules at bedtime for one week, then 3 capsules at bedtime.  Contact me for refills. 4.  Follow up in 4 months.

## 2019-06-29 ENCOUNTER — Other Ambulatory Visit: Payer: Self-pay

## 2019-06-29 ENCOUNTER — Inpatient Hospital Stay: Payer: Medicare Other | Attending: Nurse Practitioner

## 2019-06-29 VITALS — BP 132/78 | HR 78 | Temp 98.2°F | Resp 18

## 2019-06-29 DIAGNOSIS — E538 Deficiency of other specified B group vitamins: Secondary | ICD-10-CM | POA: Diagnosis not present

## 2019-06-29 DIAGNOSIS — D508 Other iron deficiency anemias: Secondary | ICD-10-CM

## 2019-06-29 DIAGNOSIS — Z79899 Other long term (current) drug therapy: Secondary | ICD-10-CM | POA: Insufficient documentation

## 2019-06-29 MED ORDER — CYANOCOBALAMIN 1000 MCG/ML IJ SOLN
INTRAMUSCULAR | Status: AC
  Filled 2019-06-29: qty 1

## 2019-06-29 MED ORDER — CYANOCOBALAMIN 1000 MCG/ML IJ SOLN
1000.0000 ug | Freq: Once | INTRAMUSCULAR | Status: AC
  Administered 2019-06-29: 10:00:00 1000 ug via INTRAMUSCULAR

## 2019-06-29 NOTE — Telephone Encounter (Signed)
Attempted to call patient again but there was no answer.  Asked her to call back.  Please assist patient in making a visit to be evaluated for possible UTI.  Melissa Gaines,CMA

## 2019-06-29 NOTE — Patient Instructions (Signed)

## 2019-07-02 ENCOUNTER — Telehealth: Payer: Self-pay

## 2019-07-02 NOTE — Telephone Encounter (Signed)
Patient calls nurse line requesting antibiotic for UTI. Per patient she is completely asymptomatic. Patient reports that she was recently seen at rheumatology office and that she had urine test performed there. She states that they told her to contact PCP for treatment of UTI. Unable to find records in [place]. Informed patient that she would likely need to schedule appointment in office, in order to provide urine sample. Patient states that she will have to call transportation services for appointment. Informed patient we would call her back with further instructions.   To PCP  Please advise  Talbot Grumbling, RN

## 2019-07-16 ENCOUNTER — Other Ambulatory Visit: Payer: Self-pay

## 2019-07-16 ENCOUNTER — Ambulatory Visit: Payer: Medicare Other | Attending: Family Medicine | Admitting: Physical Therapy

## 2019-07-16 ENCOUNTER — Encounter: Payer: Self-pay | Admitting: Physical Therapy

## 2019-07-16 DIAGNOSIS — R262 Difficulty in walking, not elsewhere classified: Secondary | ICD-10-CM | POA: Insufficient documentation

## 2019-07-16 DIAGNOSIS — M6281 Muscle weakness (generalized): Secondary | ICD-10-CM | POA: Insufficient documentation

## 2019-07-16 DIAGNOSIS — M25651 Stiffness of right hip, not elsewhere classified: Secondary | ICD-10-CM | POA: Diagnosis not present

## 2019-07-16 DIAGNOSIS — M25652 Stiffness of left hip, not elsewhere classified: Secondary | ICD-10-CM

## 2019-07-16 DIAGNOSIS — M25552 Pain in left hip: Secondary | ICD-10-CM | POA: Diagnosis not present

## 2019-07-16 NOTE — Therapy (Signed)
[place], [place], [address] Phone: [phone]   Fax:  [phone]  Physical Therapy Evaluation  Patient Details  Name: Melissa Gaines MRN: [id number] Date of Birth: [birth year] Referring Provider (PT): Chauncey Reading, MD   Encounter Date: 07/16/2019  PT End of Session - 07/16/19 1021    Visit Number  1    Number of Visits  21    Date for PT Re-Evaluation  09/29/19    Authorization Type  UHC MCR    PT Start Time  [id number]    PT Stop Time  1058    PT Time Calculation (min)  43 min    Activity Tolerance  Patient tolerated treatment well    Behavior During Therapy  [facility] for tasks assessed/performed       Past Medical History:  Diagnosis Date  . Anemia   . Anxiety   . Asthma   . Chronic pain   . Congenital hip dislocation   . GERD (gastroesophageal reflux disease)   . Iron deficiency anemia 04/14/2018  . Kidney mass 2017  . Migraine   . Osteoporosis   . Pathological dislocation of shoulder joint, bilateral    congential  . PTSD (post-traumatic stress disorder)   . Sleep apnea   . Systemic lupus erythematosus ([place])     Past Surgical History:  Procedure Laterality Date  . CESAREAN SECTION  2005  . CESAREAN SECTION W/BTL  2010  . HIP SURGERY     in childhood for congenital hip dislocation  . [place] RESECTION  2014  . STAPEDECTOMY  11/01/2011   L postauricular stapedectomy with CO2 laser and insertion of 6 x 4.75 mm SMart Piston  . TOTAL HIP ARTHROPLASTY     05/2000 R, 11/2000 L    There were no vitals filed for this visit.   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 I had hip surgery on both hips in 2002 and the Lt redone in 2013 after a dislocation. Electric shocks in mostly in my Rt leg and makes my legs move. I do not feel safe or stable when walking. I would like to get a walker. My Right hip locks  when I get into the car sometimes. No exercise right now but I do have a bike and treadmil at home.    How long can you sit comfortably?  15 min and then have to shift    How long can you stand comfortably?  <15 min    How long can you walk comfortably?  about 30 feet to bathroom    Patient Stated Goals  walking, decrease pain, more flexibility, get in/out of bed easier, don socks/shoes & tie shoes, get into/out of car    Currently in Pain?  Yes    Pain Score  8     Pain Location  --   thigh   Pain Orientation  Left    Pain Descriptors / Indicators  Aching;Sore;Cramping    Aggravating Factors   active hip flexion.    Pain Relieving Factors  move them         [facility] PT Assessment - 07/16/19 0001      Assessment   Medical Diagnosis  hip pain    Referring Provider (PT)  Chauncey Reading, MD    Hand Dominance  Right    Prior Therapy  no      Precautions  Precautions  Fall    Precaution Comments  hard of hearing      Restrictions   Weight Bearing Restrictions  No      Balance Screen   Has the patient fallen in the past 6 months  Yes    How many times?  multiple    Has the patient had a decrease in activity level because of a fear of falling?   Yes    Is the patient reluctant to leave their home because of a fear of falling?   No      Home Film/video editor residence    Living Arrangements  Children    Additional Comments  8 steps to enter home      Prior Function   Level of Independence  Independent    Vocation  On disability      Cognition   Overall Cognitive Status  Within Functional Limits for tasks assessed      Observation/Other Assessments   Focus on Therapeutic Outcomes (FOTO)   93% limited      Sensation   Additional Comments  occasional N/T      ROM / Strength   AROM / PROM / Strength  PROM;Strength      PROM   Overall PROM Comments  pain in all motions    PROM Assessment Site  Hip    Right/Left Hip  Right;Left    Right Hip Extension   0    Right Hip Flexion  62    Right Hip External Rotation   30    Right Hip Internal Rotation   24    Right Hip ABduction  32    Left Hip Extension  0    Left Hip Flexion  82    Left Hip External Rotation   20    Left Hip Internal Rotation   22    Left Hip ABduction  26      Strength   Overall Strength Comments  pain with all tests    Strength Assessment Site  Hip;Knee    Right/Left Hip  Right;Left    Right Hip Flexion  3/5    Right Hip Extension  3-/5    Right Hip External Rotation   0/5    Right Hip ABduction  4-/5    Left Hip Flexion  3/5    Left Hip Extension  3-/5    Left Hip ABduction  4-/5    Right/Left Knee  Right;Left    Right Knee Flexion  5/5    Right Knee Extension  4/5    Left Knee Flexion  5/5    Left Knee Extension  4/5      Palpation   Palpation comment  TTP with tightness in proximal half of Lt quads                Objective measurements completed on examination: See above findings.              PT Education - 07/16/19 1022    Education Details  anatomy of condition, POC, HEP, exercise form/[place]ale    Person(s) Educated  Patient    Methods  Explanation;Demonstration;Tactile cues;Verbal cues;Handout    Comprehension  Verbalized understanding;Returned demonstration;Verbal cues required;Tactile cues required;Need further instruction       PT Short Term Goals - 07/16/19 1302      PT SHORT TERM GOAL #1   Title  pt will be indpendent with short term HEP  Baseline  will progress as appropriate    Time  3    Period  Weeks    Status  New    Target Date  08/10/19      PT SHORT TERM GOAL #2   Title  pt will be able to get onto her bike at home and ride, even if not full revolutions    Baseline  has been unable to get on at eval    Time  3    Period  Weeks    Status  New    Target Date  08/10/19        PT Long Term Goals - 07/16/19 1304      PT LONG TERM GOAL #1   Title  gross LE strength to 4/5    Baseline  see  flowsheet    Time  10    Period  Weeks    Status  New    Target Date  09/29/19      PT LONG TERM GOAL #2   Title  pt will be able to get into/out of bed & car with min-no difficulty    Baseline  significant difficulty reported at eval    Time  10    Period  Weeks    Status  New    Target Date  09/29/19      PT LONG TERM GOAL #3   Title  pt will be able to ambulate while feeling safe and confident    Baseline  feels nervous and off- balance at eval, antalgic pattern    Time  10    Period  Weeks    Status  New    Target Date  09/29/19      PT LONG TERM GOAL #4   Title  pt will be independent in long term HEP    Baseline  will progress as appropriate    Time  10    Period  Weeks    Status  New    Target Date  09/29/19             Plan - 07/16/19 1058    Clinical Impression Statement  Pt presents to PT with primary complaint of Lt thigh pain but has pain in bilateral hips and Right knee. Recently slipped in her bathroom, did not fall, which started the thigh pain. Pt wants to lose weight and reports she has not started with a nutritional specialist because she has to find one that takes her insurance. She demo antalgic gait but I would not recommend a walker at this time as it would further decrease functional ambulation. Pt reported pain with all PROM but did not have any abnormal end feels. Pt will benefit from skilled PT to address goals and return her to functional level that allows her to safely exercise for weight loss.    Personal Factors and Comorbidities  Fitness;Past/Current Experience;Comorbidity 3+    Comorbidities  obesity, repeated dislocations, h/o THA & revision, OP    Examination-Activity Limitations  Bathing;Stand;Locomotion Level;Bed Mobility;Bend;Transfers;Caring for Others;Sit;Carry;Sleep;Dressing;Squat;Stairs    Examination-Participation Restrictions  Laundry;Driving;Shop;Cleaning;Other    Stability/Clinical Decision Making  Unstable/Unpredictable     Clinical Decision Making  High    Rehab Potential  Good    PT Frequency  2x / week    PT Duration  Other (comment)   10 weeks   PT Treatment/Interventions  ADLs/Self Care Home Management;Cryotherapy;Moist Heat;Gait training;Stair training;Functional mobility training;Therapeutic activities;Therapeutic exercise;Balance training;Patient/family education;Neuromuscular re-education;Manual techniques;Taping;Passive range of motion;Dry  needling    PT Next Visit Plan  STM to Lt quads, CKC strengthening & hip mobility, core engagement    PT Home Exercise Plan  [facility]    Consulted and Agree with Plan of Care  Patient       Patient will benefit from skilled therapeutic intervention in order to improve the following deficits and impairments:  Abnormal gait, Improper body mechanics, Pain, Postural dysfunction, Increased muscle spasms, Decreased activity tolerance, Decreased range of motion, Decreased endurance, Decreased strength, Decreased balance, Difficulty walking, Impaired flexibility  Visit Diagnosis: Pain in left hip - Plan: PT plan of care cert/re-cert  Difficulty in walking, not elsewhere classified - Plan: PT plan of care cert/re-cert  Muscle weakness (generalized) - Plan: PT plan of care cert/re-cert  Stiffness of left hip, not elsewhere classified - Plan: PT plan of care cert/re-cert  Stiffness of right hip, not elsewhere classified - Plan: PT plan of care cert/re-cert     Problem List Patient Active Problem List   Diagnosis Date Noted  . Left hip pain 06/26/2019  . Muscle strain of lower extremity, left, initial encounter 06/26/2019  . Healthcare maintenance 06/26/2019  . Iron deficiency anemia 04/14/2018  . Hematuria, gross 03/29/2018  . Iron deficiency anemia due to chronic blood loss 03/29/2018  . Long term current use of anticoagulant 03/29/2018  . Antiphospholipid antibody syndrome ([place]) 01/25/2018  . Morbid obesity ([place]) 07/26/2017  . Systemic lupus erythematosus  ([place]) 07/08/2017  . Hx pulmonary embolism 07/08/2017  . Chronic pain of right knee 05/23/2017  . Renal mass 01/26/2017  . 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/21/2016  . Vitamin D deficiency 12/16/2016  . Anemia   . Migraine    Nikeisha Klutz C. Shiela Bruns PT, DPT 07/16/19 1:18 PM   [facility] [address] [place], [place], [address] Phone: [phone]   Fax:  [phone]  Name: Lisa Fauntleroy MRN: [id number] Date of Birth: 13-Aug-1970

## 2019-07-23 ENCOUNTER — Ambulatory Visit: Payer: Medicare Other | Admitting: Physical Therapy

## 2019-07-23 ENCOUNTER — Other Ambulatory Visit: Payer: Self-pay

## 2019-07-23 DIAGNOSIS — M25651 Stiffness of right hip, not elsewhere classified: Secondary | ICD-10-CM | POA: Diagnosis not present

## 2019-07-23 DIAGNOSIS — M25552 Pain in left hip: Secondary | ICD-10-CM | POA: Diagnosis not present

## 2019-07-23 DIAGNOSIS — M25652 Stiffness of left hip, not elsewhere classified: Secondary | ICD-10-CM | POA: Diagnosis not present

## 2019-07-23 DIAGNOSIS — R262 Difficulty in walking, not elsewhere classified: Secondary | ICD-10-CM | POA: Diagnosis not present

## 2019-07-23 DIAGNOSIS — M6281 Muscle weakness (generalized): Secondary | ICD-10-CM

## 2019-07-23 NOTE — Therapy (Signed)
[place], [place], [address] Phone: [phone]   Fax:  [phone]  Physical Therapy Treatment  Patient Details  Name: Melissa Gaines MRN: [id number] Date of Birth: Jul 11, 1970 Referring Provider (PT): Chauncey Reading, MD   Encounter Date: 07/23/2019  PT End of Session - 07/23/19 0930    Visit Number  2    Number of Visits  21    Date for PT Re-Evaluation  09/29/19    Authorization Type  UHC MCR    PT Start Time  0932    PT Stop Time  1021    PT Time Calculation (min)  49 min       Past Medical History:  Diagnosis Date  . Anemia   . Anxiety   . Asthma   . Chronic pain   . Congenital hip dislocation   . GERD (gastroesophageal reflux disease)   . Iron deficiency anemia 04/14/2018  . Kidney mass 2017  . Migraine   . Osteoporosis   . Pathological dislocation of shoulder joint, bilateral    congential  . PTSD (post-traumatic stress disorder)   . Sleep apnea   . Systemic lupus erythematosus ([place])     Past Surgical History:  Procedure Laterality Date  . CESAREAN SECTION  2005  . CESAREAN SECTION W/BTL  2010  . HIP SURGERY     in childhood for congenital hip dislocation  . [place] RESECTION  2014  . STAPEDECTOMY  11/01/2011   L postauricular stapedectomy with CO2 laser and insertion of 6 x 4.75 mm SMart Piston  . TOTAL HIP ARTHROPLASTY     05/2000 R, 11/2000 L    There were no vitals filed for this visit.  Subjective Assessment - 07/23/19 0935    Subjective  I have pain everywhere but the thigh is still the same.    Currently in Pain?  Yes    Pain Score  8     Pain Location  Leg    Pain Orientation  Left;Upper    Pain Descriptors / Indicators  Aching;Sore;Cramping    Pain Type  Chronic pain    Aggravating Factors   lifting the leg    Pain Relieving Factors  rest                        [facility] Adult PT Treatment/Exercise - 07/23/19 0001       Knee/Hip Exercises: Stretches   Active Hamstring Stretch  3 reps;30 seconds    Hip Flexor Stretch  3 reps;30 seconds    ITB Stretch  3 reps;30 seconds    ITB Stretch Limitations  sidelying and supine wiht strap     Gastroc Stretch  3 reps;30 seconds    Gastroc Stretch Limitations  runners stretch      Knee/Hip Exercises: Supine   Short Arc Quad Sets  20 reps    Bridges  10 reps;2 sets    Other Supine Knee/Hip Exercises  supine hip abduction    Other Supine Knee/Hip Exercises  ball squeeze x 10      Manual Therapy   Manual therapy comments  STW to left anteriolateral thigh                PT Short Term Goals - 07/16/19 1302      PT SHORT TERM GOAL #1   Title  pt will be indpendent with short term HEP    Baseline  will progress  as appropriate    Time  3    Period  Weeks    Status  New    Target Date  08/10/19      PT SHORT TERM GOAL #2   Title  pt will be able to get onto her bike at home and ride, even if not full revolutions    Baseline  has been unable to get on at eval    Time  3    Period  Weeks    Status  New    Target Date  08/10/19        PT Long Term Goals - 07/16/19 1304      PT LONG TERM GOAL #1   Title  gross LE strength to 4/5    Baseline  see flowsheet    Time  10    Period  Weeks    Status  New    Target Date  09/29/19      PT LONG TERM GOAL #2   Title  pt will be able to get into/out of bed & car with min-no difficulty    Baseline  significant difficulty reported at eval    Time  10    Period  Weeks    Status  New    Target Date  09/29/19      PT LONG TERM GOAL #3   Title  pt will be able to ambulate while feeling safe and confident    Baseline  feels nervous and off- balance at eval, antalgic pattern    Time  10    Period  Weeks    Status  New    Target Date  09/29/19      PT LONG TERM GOAL #4   Title  pt will be independent in long term HEP    Baseline  will progress as appropriate    Time  10    Period  Weeks    Status   New    Target Date  09/29/19            Plan - 07/23/19 [id number]    Clinical Impression Statement  Pt reports she is about the same. Reviewed HEP and she is independent. Focused on soft tissue work and gentle stretching to left thigh. Tissue softened by end of session. Trial of HMP to further reduce pain and tension.    PT Next Visit Plan  STM to Lt quads, CKC strengthening & hip mobility, core engagement    PT Home Exercise Plan  [id number] : seated heelslides, ball squeeze seated, seated hamsting stretch, standing calf stretch, supine hip abduction       Patient will benefit from skilled therapeutic intervention in order to improve the following deficits and impairments:  Abnormal gait, Improper body mechanics, Pain, Postural dysfunction, Increased muscle spasms, Decreased activity tolerance, Decreased range of motion, Decreased endurance, Decreased strength, Decreased balance, Difficulty walking, Impaired flexibility  Visit Diagnosis: Pain in left hip  Stiffness of right hip, not elsewhere classified  Muscle weakness (generalized)  Stiffness of left hip, not elsewhere classified  Difficulty in walking, not elsewhere classified     Problem List Patient Active Problem List   Diagnosis Date Noted  . Left hip pain 06/26/2019  . Muscle strain of lower extremity, left, initial encounter 06/26/2019  . Healthcare maintenance 06/26/2019  . Iron deficiency anemia 04/14/2018  . Hematuria, gross 03/29/2018  . Iron deficiency anemia due to chronic blood loss 03/29/2018  . Long term current use  of anticoagulant 03/29/2018  . Antiphospholipid antibody syndrome ([place]) 01/25/2018  . Morbid obesity ([place]) 07/26/2017  . Systemic lupus erythematosus ([place]) 07/08/2017  . Hx pulmonary embolism 07/08/2017  . Chronic pain of right knee 05/23/2017  . Renal mass 01/26/2017  . 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/21/2016  . Vitamin D deficiency 12/16/2016  . Anemia   . Migraine     Dorene Ar,  [name] 07/23/2019, 10:15 AM  [facility] [address] [place], [place], [address] Phone: [phone]   Fax:  [phone]  Name: Melissa Gaines MRN: [id number] Date of Birth: 09/27/1970

## 2019-07-27 ENCOUNTER — Other Ambulatory Visit: Payer: Self-pay

## 2019-07-27 ENCOUNTER — Inpatient Hospital Stay: Payer: Medicare Other | Attending: Nurse Practitioner

## 2019-07-27 ENCOUNTER — Inpatient Hospital Stay: Payer: Medicare Other

## 2019-07-27 VITALS — BP 134/94 | HR 81 | Temp 98.5°F | Resp 20

## 2019-07-27 DIAGNOSIS — D509 Iron deficiency anemia, unspecified: Secondary | ICD-10-CM | POA: Insufficient documentation

## 2019-07-27 DIAGNOSIS — E538 Deficiency of other specified B group vitamins: Secondary | ICD-10-CM | POA: Diagnosis not present

## 2019-07-27 DIAGNOSIS — D508 Other iron deficiency anemias: Secondary | ICD-10-CM

## 2019-07-27 LAB — FERRITIN: Ferritin: 100 ng/mL (ref 11–307)

## 2019-07-27 LAB — CMP (CANCER CENTER ONLY)
ALT: 13 U/L (ref 0–44)
AST: 12 U/L — ABNORMAL LOW (ref 15–41)
Albumin: 3.6 g/dL (ref 3.5–5.0)
Alkaline Phosphatase: 73 U/L (ref 38–126)
Anion gap: 9 (ref 5–15)
BUN: 8 mg/dL (ref 6–20)
CO2: 23 mmol/L (ref 22–32)
Calcium: 9.3 mg/dL (ref 8.9–10.3)
Chloride: 107 mmol/L (ref 98–111)
Creatinine: 0.64 mg/dL (ref 0.44–1.00)
GFR, Est AFR Am: >60 mL/min (ref >60)
GFR, Estimated: >60 mL/min (ref >60)
Glucose, Bld: 123 mg/dL — ABNORMAL HIGH (ref 70–99)
Potassium: 3.7 mmol/L (ref 3.5–5.1)
Sodium: 139 mmol/L (ref 135–145)
Total Bilirubin: 0.3 mg/dL (ref 0.3–1.2)
Total Protein: 8 g/dL (ref 6.5–8.1)

## 2019-07-27 LAB — CBC WITH DIFFERENTIAL (CANCER CENTER ONLY)
Abs Immature Granulocytes: 0.03 10*3/uL (ref 0.00–0.07)
Basophils Absolute: 0.1 10*3/uL (ref 0.0–0.1)
Basophils Relative: 1 %
Eosinophils Absolute: 0.1 10*3/uL (ref 0.0–0.5)
Eosinophils Relative: 2 %
HCT: 43 % (ref 36.0–46.0)
Hemoglobin: 14.2 g/dL (ref 12.0–15.0)
Immature Granulocytes: 0 %
Lymphocytes Relative: 32 %
Lymphs Abs: 2.3 10*3/uL (ref 0.7–4.0)
MCH: 29.2 pg (ref 26.0–34.0)
MCHC: 33 g/dL (ref 30.0–36.0)
MCV: 88.5 fL (ref 80.0–100.0)
Monocytes Absolute: 0.4 10*3/uL (ref 0.1–1.0)
Monocytes Relative: 5 %
Neutro Abs: 4.3 10*3/uL (ref 1.7–7.7)
Neutrophils Relative %: 60 %
Platelet Count: 293 10*3/uL (ref 150–400)
RBC: 4.86 MIL/uL (ref 3.87–5.11)
RDW: 15.1 % (ref 11.5–15.5)
WBC Count: 7.2 10*3/uL (ref 4.0–10.5)
nRBC: 0 % (ref 0.0–0.2)

## 2019-07-27 LAB — IRON AND TIBC
Iron: 60 ug/dL (ref 41–142)
Saturation Ratios: 17 % — ABNORMAL LOW (ref 21–57)
TIBC: 361 ug/dL (ref 236–444)
UIBC: 301 ug/dL (ref 120–384)

## 2019-07-27 LAB — VITAMIN B12: Vitamin B-12: 230 pg/mL (ref 180–914)

## 2019-07-27 MED ORDER — CYANOCOBALAMIN 1000 MCG/ML IJ SOLN
1000.0000 ug | Freq: Once | INTRAMUSCULAR | Status: AC
  Administered 2019-07-27: 1000 ug via INTRAMUSCULAR

## 2019-07-27 NOTE — Patient Instructions (Signed)
Cyanocobalamin, Pyridoxine, and Folate What is this medicine? A multivitamin containing folic acid, vitamin B6, and vitamin B12. This medicine may be used for other purposes; ask your health care provider or pharmacist if you have questions. COMMON BRAND NAME(S): AllanFol RX, AllanTex, Av-Vite FB, B Complex with Folic Acid, ComBgen, FaBB, Folamin, Folastin, Folbalin, Folbee, Folbic, Folcaps, Folgard, Folgard RX, Folgard RX 2.2, Folplex, Folplex 2.2, Foltabs 800, Foltx, Homocysteine Formula, Niva-Fol, NuFol, TL Gard RX, Virt-Gard, Virt-Vite, Virt-Vite Forte, Vita-Respa What should I tell my health care provider before I take this medicine? They need to know if you have any of these conditions:  bleeding or clotting disorder  history of anemia of any type  other chronic health condition  an unusual or allergic reaction to vitamins, other medicines, foods, dyes, or preservatives  pregnant or trying to get pregnant  breast-feeding How should I use this medicine? Take by mouth with a glass of water. May take with food. Follow the directions on the prescription label. It is usually given once a day. Do not take your medicine more often than directed. Contact your pediatrician regarding the use of this medicine in children. Special care may be needed. Overdosage: If you think you have taken too much of this medicine contact a poison control center or emergency room at once. NOTE: This medicine is only for you. Do not share this medicine with others. What if I miss a dose? If you miss a dose, take it as soon as you can. If it is almost time for your next dose, take only that dose. Do not take double or extra doses. What may interact with this medicine?  levodopa This list may not describe all possible interactions. Give your health care provider a list of all the medicines, herbs, non-prescription drugs, or dietary supplements you use. Also tell them if you smoke, drink alcohol, or use illegal  drugs. Some items may interact with your medicine. What should I watch for while using this medicine? See your health care professional for regular checks on your progress. Remember that vitamin supplements do not replace the need for good nutrition from a balanced diet. What side effects may I notice from receiving this medicine? Side effects that you should report to your doctor or health care professional as soon as possible:  allergic reaction such as skin rash or difficulty breathing  vomiting Side effects that usually do not require medical attention (report to your doctor or health care professional if they continue or are bothersome):  nausea  stomach upset This list may not describe all possible side effects. Call your doctor for medical advice about side effects. You may report side effects to FDA at 1-800-FDA-1088. Where should I keep my medicine? Keep out of the reach of children. Most vitamins should be stored at controlled room temperature. Check your specific product directions. Protect from heat and moisture. Throw away any unused medicine after the expiration date. NOTE: This sheet is a summary. It may not cover all possible information. If you have questions about this medicine, talk to your doctor, pharmacist, or health care provider.  2020 Elsevier/Gold Standard (2007-04-15 00:59:55)  

## 2019-08-01 ENCOUNTER — Other Ambulatory Visit: Payer: Self-pay

## 2019-08-01 ENCOUNTER — Ambulatory Visit: Payer: Medicare Other | Admitting: Physical Therapy

## 2019-08-01 ENCOUNTER — Encounter: Payer: Self-pay | Admitting: Physical Therapy

## 2019-08-01 DIAGNOSIS — R262 Difficulty in walking, not elsewhere classified: Secondary | ICD-10-CM | POA: Diagnosis not present

## 2019-08-01 DIAGNOSIS — M6281 Muscle weakness (generalized): Secondary | ICD-10-CM | POA: Diagnosis not present

## 2019-08-01 DIAGNOSIS — M25651 Stiffness of right hip, not elsewhere classified: Secondary | ICD-10-CM | POA: Diagnosis not present

## 2019-08-01 DIAGNOSIS — M25552 Pain in left hip: Secondary | ICD-10-CM

## 2019-08-01 DIAGNOSIS — M25652 Stiffness of left hip, not elsewhere classified: Secondary | ICD-10-CM

## 2019-08-01 NOTE — Therapy (Signed)
[place], [place], [address] Phone: [phone]   Fax:  [phone]  Physical Therapy Treatment  Patient Details  Name: Melissa Gaines MRN: [id number] Date of Birth: 1971-02-13 Referring Provider (PT): Chauncey Reading, MD   Encounter Date: 08/01/2019  PT End of Session - 08/01/19 1030    Visit Number  3    Number of Visits  21    Date for PT Re-Evaluation  09/29/19    Authorization Type  UHC MCR    PT Start Time  1026   arrived early for 11:45 apt & chose to be seen early   PT Stop Time  1056    PT Time Calculation (min)  30 min    Activity Tolerance  Patient tolerated treatment well    Behavior During Therapy  [facility] for tasks assessed/performed       Past Medical History:  Diagnosis Date  . Anemia   . Anxiety   . Asthma   . Chronic pain   . Congenital hip dislocation   . GERD (gastroesophageal reflux disease)   . Iron deficiency anemia 04/14/2018  . Kidney mass 2017  . Migraine   . Osteoporosis   . Pathological dislocation of shoulder joint, bilateral    congential  . PTSD (post-traumatic stress disorder)   . Sleep apnea   . Systemic lupus erythematosus ([place])     Past Surgical History:  Procedure Laterality Date  . CESAREAN SECTION  2005  . CESAREAN SECTION W/BTL  2010  . HIP SURGERY     in childhood for congenital hip dislocation  . [place] RESECTION  2014  . STAPEDECTOMY  11/01/2011   L postauricular stapedectomy with CO2 laser and insertion of 6 x 4.75 mm SMart Piston  . TOTAL HIP ARTHROPLASTY     05/2000 R, 11/2000 L    There were no vitals filed for this visit.  Subjective Assessment - 08/01/19 1029    Subjective  Exercises are going well at home. The heel slides decrease the tension. Still cannot lift my leg up    Patient Stated Goals  walking, decrease pain, more flexibility, get in/out of bed easier, don socks/shoes & tie shoes, get into/out of car                         [facility] Adult PT Treatment/Exercise - 08/01/19 0001      Exercises   Exercises  Knee/Hip      Knee/Hip Exercises: Stretches   Passive Hamstring Stretch  Both;30 seconds    Passive Hamstring Stretch Limitations  seated EOB      Knee/Hip Exercises: Aerobic   Stationary Bike  edu for bike at home, pendulums      Knee/Hip Exercises: Standing   Heel Raises  20 reps;10 reps    Hip Abduction  Both;15 reps      Knee/Hip Exercises: Seated   Heel Slides Limitations  both feet on towel, alternating slides, flx/ext & abd    Other Seated Knee/Hip Exercises  trunk rotation holding bar- hip flexion on ipsilateral side, 6" step under feet             PT Education - 08/01/19 1057    Education Details  rationale for exercises, ambulation with arm swing    Person(s) Educated  Patient    Methods  Explanation;Demonstration;Tactile cues;Verbal cues;Handout    Comprehension  Verbalized understanding;Returned demonstration;Verbal cues required;Tactile cues required;Need further instruction  PT Short Term Goals - 07/16/19 1302      PT SHORT TERM GOAL #1   Title  pt will be indpendent with short term HEP    Baseline  will progress as appropriate    Time  3    Period  Weeks    Status  New    Target Date  08/10/19      PT SHORT TERM GOAL #2   Title  pt will be able to get onto her bike at home and ride, even if not full revolutions    Baseline  has been unable to get on at eval    Time  3    Period  Weeks    Status  New    Target Date  08/10/19        PT Long Term Goals - 07/16/19 1304      PT LONG TERM GOAL #1   Title  gross LE strength to 4/5    Baseline  see flowsheet    Time  10    Period  Weeks    Status  New    Target Date  09/29/19      PT LONG TERM GOAL #2   Title  pt will be able to get into/out of bed & car with min-no difficulty    Baseline  significant difficulty reported at eval    Time  10    Period  Weeks     Status  New    Target Date  09/29/19      PT LONG TERM GOAL #3   Title  pt will be able to ambulate while feeling safe and confident    Baseline  feels nervous and off- balance at eval, antalgic pattern    Time  10    Period  Weeks    Status  New    Target Date  09/29/19      PT LONG TERM GOAL #4   Title  pt will be independent in long term HEP    Baseline  will progress as appropriate    Time  10    Period  Weeks    Status  New    Target Date  09/29/19            Plan - 08/01/19 1059    Clinical Impression Statement  Pt is very hesistant to do her exercises so we discussed compensation patterns and long term consequences. Practiced pendulum swings on the recumbunt bike which will be easier on her upright bike at home. Will continue to progress HEP as tolerated.    PT Treatment/Interventions  ADLs/Self Care Home Management;Cryotherapy;Moist Heat;Gait training;Stair training;Functional mobility training;Therapeutic activities;Therapeutic exercise;Balance training;Patient/family education;Neuromuscular re-education;Manual techniques;Taping;Passive range of motion;Dry needling    PT Next Visit Plan  continue to challenge muscular activation in lumbopelvic region, review gait with arm swing    PT Home Exercise Plan  [id number] : seated heelslides, ball squeeze seated, seated hamsting stretch, standing calf stretch, supine hip abduction, bike pendulum, seated march, seated LAQ    Consulted and Agree with Plan of Care  Patient       Patient will benefit from skilled therapeutic intervention in order to improve the following deficits and impairments:  Abnormal gait, Improper body mechanics, Pain, Postural dysfunction, Increased muscle spasms, Decreased activity tolerance, Decreased range of motion, Decreased endurance, Decreased strength, Decreased balance, Difficulty walking, Impaired flexibility  Visit Diagnosis: Pain in left hip  Stiffness of right hip, not elsewhere  classified  Muscle weakness (generalized)  Stiffness of left hip, not elsewhere classified  Difficulty in walking, not elsewhere classified     Problem List Patient Active Problem List   Diagnosis Date Noted  . Left hip pain 06/26/2019  . Muscle strain of lower extremity, left, initial encounter 06/26/2019  . Healthcare maintenance 06/26/2019  . Iron deficiency anemia 04/14/2018  . Hematuria, gross 03/29/2018  . Iron deficiency anemia due to chronic blood loss 03/29/2018  . Long term current use of anticoagulant 03/29/2018  . Antiphospholipid antibody syndrome ([place]) 01/25/2018  . Morbid obesity ([place]) 07/26/2017  . Systemic lupus erythematosus ([place]) 07/08/2017  . Hx pulmonary embolism 07/08/2017  . Chronic pain of right knee 05/23/2017  . Renal mass 01/26/2017  . 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/21/2016  . Vitamin D deficiency 12/16/2016  . Anemia   . Migraine    Barri Neidlinger C. Darionna Banke PT, DPT 08/01/19 11:05 AM   [facility] [address] [place], [place], [address] Phone: [phone]   Fax:  [phone]  Name: Khris Reil MRN: [id number] Date of Birth: 07-27-1970

## 2019-08-04 ENCOUNTER — Other Ambulatory Visit: Payer: Self-pay | Admitting: Neurology

## 2019-08-07 ENCOUNTER — Other Ambulatory Visit: Payer: Self-pay

## 2019-08-07 ENCOUNTER — Ambulatory Visit: Payer: Medicare Other | Attending: Family Medicine | Admitting: Physical Therapy

## 2019-08-07 DIAGNOSIS — M25652 Stiffness of left hip, not elsewhere classified: Secondary | ICD-10-CM | POA: Diagnosis not present

## 2019-08-07 DIAGNOSIS — R262 Difficulty in walking, not elsewhere classified: Secondary | ICD-10-CM | POA: Insufficient documentation

## 2019-08-07 DIAGNOSIS — M25651 Stiffness of right hip, not elsewhere classified: Secondary | ICD-10-CM | POA: Insufficient documentation

## 2019-08-07 DIAGNOSIS — M6281 Muscle weakness (generalized): Secondary | ICD-10-CM | POA: Insufficient documentation

## 2019-08-07 DIAGNOSIS — M25552 Pain in left hip: Secondary | ICD-10-CM | POA: Insufficient documentation

## 2019-08-07 NOTE — Therapy (Signed)
[place], [place], [address] Phone: [phone]   Fax:  [phone]  Physical Therapy Treatment  Patient Details  Name: Melissa Gaines MRN: [id number] Date of Birth: [birth year] Referring Provider (PT): Chauncey Reading, MD   Encounter Date: 08/07/2019  PT End of Session - 08/07/19 1106    Visit Number  4    Number of Visits  21    Date for PT Re-Evaluation  09/29/19    Authorization Type  UHC MCR    PT Start Time  1100    PT Stop Time  [id number]    PT Time Calculation (min)  39 min       Past Medical History:  Diagnosis Date   Anemia    Anxiety    Asthma    Chronic pain    Congenital hip dislocation    GERD (gastroesophageal reflux disease)    Iron deficiency anemia 04/14/2018   Kidney mass 2017   Migraine    Osteoporosis    Pathological dislocation of shoulder joint, bilateral    congential   PTSD (post-traumatic stress disorder)    Sleep apnea    Systemic lupus erythematosus ([place])     Past Surgical History:  Procedure Laterality Date   CESAREAN SECTION  2005   CESAREAN SECTION W/BTL  2010   HIP SURGERY     in childhood for congenital hip dislocation   LAPAROSCOPIC GASTRIC SLEEVE RESECTION  2014   STAPEDECTOMY  11/01/2011   L postauricular stapedectomy with CO2 laser and insertion of 6 x 4.75 mm SMart Piston   TOTAL HIP ARTHROPLASTY     05/2000 R, 11/2000 L    There were no vitals filed for this visit.  Subjective Assessment - 08/07/19 1105    Subjective  The leg is a little better but it still hurts to left the leg up.    Currently in Pain?  Yes    Pain Score  8     Pain Location  Leg   also back and knee   Pain Orientation  Right;Upper    Aggravating Factors   trying to left leg, trying to ride bike    Pain Relieving Factors  rest                        [facility] Adult PT Treatment/Exercise - 08/07/19 0001      Knee/Hip Exercises:  Stretches   Active Hamstring Stretch  3 reps;30 seconds    Active Hamstring Stretch Limitations  supine with strap and assist     ITB Stretch  3 reps;30 seconds    ITB Stretch Limitations  supine with strap and assist     Gastroc Stretch  3 reps;30 seconds    Gastroc Stretch Limitations  runners stretch      Knee/Hip Exercises: Aerobic   Nustep  L3 LE only       Knee/Hip Exercises: Standing   Hip Abduction  Both;15 reps      Knee/Hip Exercises: Seated   Long Arc Quad  20 reps    Long Arc Quad Limitations  alternating     Heel Slides Limitations  both feet on towel, alternating slides, flx/ext    Marching  20 reps    Marching Limitations  alternating       Knee/Hip Exercises: Supine   Quad Sets  20 reps    Short Arc Quad Sets  20 reps    Heel Slides  15 reps    Bridges  10 reps;2 sets    Other Supine Knee/Hip Exercises  supine hip abduction    Other Supine Knee/Hip Exercises  supine right reverse clam , supine marching       Knee/Hip Exercises: Sidelying   Hip ABduction  15 reps    Clams  x 10 , reverse x 10 minimal lift                PT Short Term Goals - 07/16/19 1302      PT SHORT TERM GOAL #1   Title  pt will be indpendent with short term HEP    Baseline  will progress as appropriate    Time  3    Period  Weeks    Status  New    Target Date  08/10/19      PT SHORT TERM GOAL #2   Title  pt will be able to get onto her bike at home and ride, even if not full revolutions    Baseline  has been unable to get on at eval    Time  3    Period  Weeks    Status  New    Target Date  08/10/19        PT Long Term Goals - 07/16/19 1304      PT LONG TERM GOAL #1   Title  gross LE strength to 4/5    Baseline  see flowsheet    Time  10    Period  Weeks    Status  New    Target Date  09/29/19      PT LONG TERM GOAL #2   Title  pt will be able to get into/out of bed & car with min-no difficulty    Baseline  significant difficulty reported at eval    Time   10    Period  Weeks    Status  New    Target Date  09/29/19      PT LONG TERM GOAL #3   Title  pt will be able to ambulate while feeling safe and confident    Baseline  feels nervous and off- balance at eval, antalgic pattern    Time  10    Period  Weeks    Status  New    Target Date  09/29/19      PT LONG TERM GOAL #4   Title  pt will be independent in long term HEP    Baseline  will progress as appropriate    Time  10    Period  Weeks    Status  New    Target Date  09/29/19            Plan - 08/07/19 1107    Clinical Impression Statement  Pt reports she is unable to attempt her bike at home due to knee locking and inability to lift her leg. her hip flexion AROM is improving however still very weak. She was able to perform marching in hooklying and standing but with increased pain.    PT Next Visit Plan  continue to challenge muscular activation in lumbopelvic region, review gait with arm swing    PT Home Exercise Plan  [id number] : seated heelslides, ball squeeze seated, seated hamsting stretch, standing calf stretch, supine hip abduction, bike pendulum, seated march, seated LAQ    Consulted and Agree with Plan of Care  Patient       Patient will  benefit from skilled therapeutic intervention in order to improve the following deficits and impairments:  Abnormal gait, Improper body mechanics, Pain, Postural dysfunction, Increased muscle spasms, Decreased activity tolerance, Decreased range of motion, Decreased endurance, Decreased strength, Decreased balance, Difficulty walking, Impaired flexibility  Visit Diagnosis: Pain in left hip  Stiffness of right hip, not elsewhere classified  Stiffness of left hip, not elsewhere classified  Difficulty in walking, not elsewhere classified  Muscle weakness (generalized)     Problem List Patient Active Problem List   Diagnosis Date Noted   Left hip pain 06/26/2019   Muscle strain of lower extremity, left, initial encounter  06/26/2019   Healthcare maintenance 06/26/2019   Iron deficiency anemia 04/14/2018   Hematuria, gross 03/29/2018   Iron deficiency anemia due to chronic blood loss 03/29/2018   Long term current use of anticoagulant 03/29/2018   Antiphospholipid antibody syndrome ([place]) 01/25/2018   Morbid obesity ([place]) 07/26/2017   Systemic lupus erythematosus ([place]) 07/08/2017   Hx pulmonary embolism 07/08/2017   Chronic pain of right knee 05/23/2017   Renal mass 01/26/2017   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/21/2016   Vitamin D deficiency 12/16/2016   Anemia    Migraine     Dorene Ar, PTA 08/07/2019, 11:39 AM  [facility] [address] [place], [place], [address] Phone: [phone]   Fax:  [phone]  Name: Taijah Cowins MRN: [id number] Date of Birth: January 23, 1971

## 2019-08-08 ENCOUNTER — Encounter (HOSPITAL_COMMUNITY): Payer: Self-pay | Admitting: Psychiatry

## 2019-08-08 ENCOUNTER — Telehealth (INDEPENDENT_AMBULATORY_CARE_PROVIDER_SITE_OTHER): Payer: Medicare Other | Admitting: Psychiatry

## 2019-08-08 DIAGNOSIS — F4310 Post-traumatic stress disorder, unspecified: Secondary | ICD-10-CM

## 2019-08-08 DIAGNOSIS — F331 Major depressive disorder, recurrent, moderate: Secondary | ICD-10-CM

## 2019-08-08 MED ORDER — HYDROXYZINE PAMOATE 50 MG PO CAPS: 50.0000 mg | ORAL_CAPSULE | Freq: Every day | ORAL | 0 refills | Status: DC

## 2019-08-08 MED ORDER — PAROXETINE HCL 40 MG PO TABS: 40.0000 mg | ORAL_TABLET | Freq: Every day | ORAL | 0 refills | Status: DC
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
## 2019-08-09 ENCOUNTER — Encounter: Payer: Self-pay | Admitting: Physical Therapy

## 2019-08-09 ENCOUNTER — Ambulatory Visit: Payer: Medicare Other | Admitting: Physical Therapy

## 2019-08-09 ENCOUNTER — Other Ambulatory Visit: Payer: Self-pay

## 2019-08-09 DIAGNOSIS — M25652 Stiffness of left hip, not elsewhere classified: Secondary | ICD-10-CM

## 2019-08-09 DIAGNOSIS — M25651 Stiffness of right hip, not elsewhere classified: Secondary | ICD-10-CM | POA: Diagnosis not present

## 2019-08-09 DIAGNOSIS — M6281 Muscle weakness (generalized): Secondary | ICD-10-CM | POA: Diagnosis not present

## 2019-08-09 DIAGNOSIS — R262 Difficulty in walking, not elsewhere classified: Secondary | ICD-10-CM | POA: Diagnosis not present

## 2019-08-09 DIAGNOSIS — M25552 Pain in left hip: Secondary | ICD-10-CM

## 2019-08-09 NOTE — Therapy (Signed)
[place], [place], [address] Phone: [phone]   Fax:  [phone]  Physical Therapy Treatment  Patient Details  Name: Melissa Gaines MRN: [id number] Date of Birth: Jul 28, 1970 Referring Provider (PT): Chauncey Reading, MD   Encounter Date: 08/09/2019  PT End of Session - 08/09/19 1020    Visit Number  5    Number of Visits  21    Date for PT Re-Evaluation  09/29/19    Authorization Type  UHC MCR    PT Start Time  [id number]    PT Stop Time  1053    PT Time Calculation (min)  38 min       Past Medical History:  Diagnosis Date  . Anemia   . Anxiety   . Asthma   . Chronic pain   . Congenital hip dislocation   . GERD (gastroesophageal reflux disease)   . Iron deficiency anemia 04/14/2018  . Kidney mass 2017  . Migraine   . Osteoporosis   . Pathological dislocation of shoulder joint, bilateral    congential  . PTSD (post-traumatic stress disorder)   . Sleep apnea   . Systemic lupus erythematosus ([place])     Past Surgical History:  Procedure Laterality Date  . CESAREAN SECTION  2005  . CESAREAN SECTION W/BTL  2010  . HIP SURGERY     in childhood for congenital hip dislocation  . [place] RESECTION  2014  . STAPEDECTOMY  11/01/2011   L postauricular stapedectomy with CO2 laser and insertion of 6 x 4.75 mm SMart Piston  . TOTAL HIP ARTHROPLASTY     05/2000 R, 11/2000 L    There were no vitals filed for this visit.  Subjective Assessment - 08/09/19 1016    Subjective  The rain makes the metal plate in my hip stiffen. It feels harder for my muscles to move.    Currently in Pain?  Yes    Pain Score  8     Pain Location  Leg    Pain Orientation  Right    Pain Descriptors / Indicators  Aching;Sore;Cramping    Pain Type  Chronic pain                        [facility] Adult PT Treatment/Exercise - 08/09/19 0001      Knee/Hip Exercises: Stretches   Active  Hamstring Stretch  3 reps;30 seconds    Active Hamstring Stretch Limitations  supine with strap and assist     Hip Flexor Stretch  3 reps;30 seconds    ITB Stretch  3 reps;30 seconds    ITB Stretch Limitations  supine with strap and assist       Knee/Hip Exercises: Aerobic   Nustep  L3 LE only  x 6 minutes       Knee/Hip Exercises: Standing   Heel Raises  20 reps    Hip Flexion Limitations  alternating march at counter x 20      Abduction Limitations  red band side steppng at counter x 5 passes each way     Hip Extension  10 reps    Extension Limitations  2 sets     Lateral Step Up  --    Lateral Step Up Limitations  --    Forward Step Up  Right;Left;1 set;10 reps;Hand Hold: 1;Step Height: 4"      Knee/Hip Exercises: Seated   Long Arc Quad  20 reps  Long Print production planner Limitations  alternating     Sit to [employer]  10 reps   low bariatric table     Knee/Hip Exercises: Supine   Quad Sets  20 reps    Short Arc [employer]  20 reps    Heel Slides  10 reps    Bridges  10 reps;2 sets    Other Supine Knee/Hip Exercises  ball squeeze hooklying x 20     Other Supine Knee/Hip Exercises  supine right reverse clam , supine marching       Knee/Hip Exercises: Sidelying   Hip ABduction  15 reps    Clams  x 20 , reverse x 20               PT Short Term Goals - 07/16/19 1302      PT SHORT TERM GOAL #1   Title  pt will be indpendent with short term HEP    Baseline  will progress as appropriate    Time  3    Period  Weeks    Status  New    Target Date  08/10/19      PT SHORT TERM GOAL #2   Title  pt will be able to get onto her bike at home and ride, even if not full revolutions    Baseline  has been unable to get on at eval    Time  3    Period  Weeks    Status  New    Target Date  08/10/19        PT Long Term Goals - 07/16/19 1304      PT LONG TERM GOAL #1   Title  gross LE strength to 4/5    Baseline  see  flowsheet    Time  10    Period  Weeks    Status  New    Target Date  09/29/19      PT LONG TERM GOAL #2   Title  pt will be able to get into/out of bed & car with min-no difficulty    Baseline  significant difficulty reported at eval    Time  10    Period  Weeks    Status  New    Target Date  09/29/19      PT LONG TERM GOAL #3   Title  pt will be able to ambulate while feeling safe and confident    Baseline  feels nervous and off- balance at eval, antalgic pattern    Time  10    Period  Weeks    Status  New    Target Date  09/29/19      PT LONG TERM GOAL #4   Title  pt will be independent in long term HEP    Baseline  will progress as appropriate    Time  10    Period  Weeks    Status  New    Target Date  09/29/19            Plan - 08/09/19 1052    Clinical Impression Statement  Pt reports more stiffness and difficulty lifting leg today due to the weather, rain. Continued as tolerated with mostly open chain strengthening. She was able to begin 4 inch step ups with cues to decrease compensation to lift RLE onto step.    PT Next Visit Plan  continue  to challenge muscular activation in lumbopelvic region, review gait with arm swing    PT Home Exercise Plan  [id number] : seated heelslides, ball squeeze seated, seated hamsting stretch, standing calf stretch, supine hip abduction, bike pendulum, seated march, seated LAQ       Patient will benefit from skilled therapeutic intervention in order to improve the following deficits and impairments:  Abnormal gait, Improper body mechanics, Pain, Postural dysfunction, Increased muscle spasms, Decreased activity tolerance, Decreased range of motion, Decreased endurance, Decreased strength, Decreased balance, Difficulty walking, Impaired flexibility  Visit Diagnosis: Pain in left hip  Stiffness of right hip, not elsewhere classified  Stiffness of left hip, not elsewhere classified  Difficulty in walking, not elsewhere  classified  Muscle weakness (generalized)     Problem List Patient Active Problem List   Diagnosis Date Noted  . Left hip pain 06/26/2019  . Muscle strain of lower extremity, left, initial encounter 06/26/2019  . Healthcare maintenance 06/26/2019  . Iron deficiency anemia 04/14/2018  . Hematuria, gross 03/29/2018  . Iron deficiency anemia due to chronic blood loss 03/29/2018  . Long term current use of anticoagulant 03/29/2018  . Antiphospholipid antibody syndrome ([place]) 01/25/2018  . Morbid obesity ([place]) 07/26/2017  . Systemic lupus erythematosus ([place]) 07/08/2017  . Hx pulmonary embolism 07/08/2017  . Chronic pain of right knee 05/23/2017  . Renal mass 01/26/2017  . 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/21/2016  . Vitamin D deficiency 12/16/2016  . Anemia   . Migraine     Dorene Ar, [name] 08/09/2019, 11:00 AM  [facility] [address] [place], [place], [address] Phone: [phone]   Fax:  [phone]  Name: Melissa Gaines MRN: [id number] Date of Birth: 08/28/1970

## 2019-08-10 ENCOUNTER — Other Ambulatory Visit: Payer: Self-pay | Admitting: Family Medicine

## 2019-08-10 ENCOUNTER — Telehealth: Payer: Self-pay | Admitting: Family Medicine

## 2019-08-10 ENCOUNTER — Encounter: Payer: Self-pay | Admitting: Pharmacist

## 2019-08-10 DIAGNOSIS — R760 Raised antibody titer: Secondary | ICD-10-CM

## 2019-08-10 DIAGNOSIS — M329 Systemic lupus erythematosus, unspecified: Secondary | ICD-10-CM

## 2019-08-10 DIAGNOSIS — J4530 Mild persistent asthma, uncomplicated: Secondary | ICD-10-CM

## 2019-08-10 MED ORDER — ALBUTEROL SULFATE HFA 108 (90 BASE) MCG/ACT IN AERS: 2.0000 | INHALATION_SPRAY | Freq: Four times a day (QID) | RESPIRATORY_TRACT | 3 refills | Status: DC | PRN

## 2019-08-10 NOTE — Progress Notes (Signed)
Discussed anticoagulation options with Dr. Chauncey Reading  Medication Samples have been provided to the patient.  Drug name: Eliquis (apixaban)       Strength: 5mg         Qty: 70  LOT: [id number]  Exp.Date: 06/05/2021  Dosing instructions: 1 BID  The patient has been instructed regarding the correct time, dose, and frequency of taking this medication, including desired effects and most common side effects.   Melissa Gaines 2:50 PM 08/10/2019   Plan to coordinate indigent care supply through manufacturer for continued treatment.

## 2019-08-10 NOTE — Telephone Encounter (Signed)
Patient informed provider today that she is not currently taking apixaban and has been off of it for about 2 months. Patient has h/o SLE, antiphospholibid antibody positive, h/o PE (while pregnant). She has been prescribed apixaban since at least November 2019 without bleeding or clotting event (per chart review). Patient is seen by Hermelinda Medicus, MD, at [facility] rheumatology, for SLE and antiphospholipid antibody positive syndrome. Last visit in March 2021 did not mention anticoagulation. However, the note from February 2020 says to continue anticoagulation and f/u with hematology. Per care everywhere and chart review, it does not appear that patient has seen a hematologist/oncologist. Will refer her to see one as she has been quite stable, but due to antiphospholipid antibody positive status, need their recommendations on anticoagulation.  HAS-BLED score 1 for medication usage  Well's Criteria 0 currently  Patient was given samples today of apixaban 5mg  BID. Will place CCM/THN medication assistance to afford her medication as patient has [employer]. I gave patient packet today to fill out. Dr. Valentina Lucks also forwarded to Cheryle Horsfall for assistance. Patient will fill out forms and expect call.  [name], MD [facility], PGY-1

## 2019-08-15 ENCOUNTER — Ambulatory Visit: Payer: Medicare Other | Admitting: Physical Therapy

## 2019-08-15 ENCOUNTER — Ambulatory Visit (HOSPITAL_COMMUNITY): Payer: Medicare Other | Admitting: Psychiatry

## 2019-08-17 ENCOUNTER — Encounter: Payer: Self-pay | Admitting: Physical Therapy

## 2019-08-17 ENCOUNTER — Ambulatory Visit: Payer: Medicare Other | Admitting: Physical Therapy

## 2019-08-17 ENCOUNTER — Other Ambulatory Visit: Payer: Self-pay

## 2019-08-17 DIAGNOSIS — M25652 Stiffness of left hip, not elsewhere classified: Secondary | ICD-10-CM | POA: Diagnosis not present

## 2019-08-17 DIAGNOSIS — M25651 Stiffness of right hip, not elsewhere classified: Secondary | ICD-10-CM | POA: Diagnosis not present

## 2019-08-17 DIAGNOSIS — R262 Difficulty in walking, not elsewhere classified: Secondary | ICD-10-CM | POA: Diagnosis not present

## 2019-08-17 DIAGNOSIS — M25552 Pain in left hip: Secondary | ICD-10-CM

## 2019-08-17 DIAGNOSIS — M6281 Muscle weakness (generalized): Secondary | ICD-10-CM | POA: Diagnosis not present

## 2019-08-17 NOTE — Therapy (Signed)
[place], [place], [address] Phone: [phone]   Fax:  [phone]  Physical Therapy Treatment  Patient Details  Name: Melissa Gaines MRN: [id number] Date of Birth: [birth year] Referring Provider (PT): Chauncey Reading, MD   Encounter Date: 08/17/2019   PT End of Session - 08/17/19 1057    Visit Number 6    Number of Visits 21    Date for PT Re-Evaluation 09/29/19    Authorization Type UHC MCR    PT Start Time 6579    PT Stop Time 1057    PT Time Calculation (min) 42 min    Activity Tolerance Patient tolerated treatment well    Behavior During Therapy [facility] for tasks assessed/performed           Past Medical History:  Diagnosis Date   Anemia    Anxiety    Asthma    Chronic pain    Congenital hip dislocation    GERD (gastroesophageal reflux disease)    Iron deficiency anemia 04/14/2018   Kidney mass 2017   Migraine    Osteoporosis    Pathological dislocation of shoulder joint, bilateral    congential   PTSD (post-traumatic stress disorder)    Sleep apnea    Systemic lupus erythematosus ([place])     Past Surgical History:  Procedure Laterality Date   CESAREAN SECTION  2005   CESAREAN SECTION W/BTL  2010   HIP SURGERY     in childhood for congenital hip dislocation   LAPAROSCOPIC GASTRIC SLEEVE RESECTION  2014   STAPEDECTOMY  11/01/2011   L postauricular stapedectomy with CO2 laser and insertion of 6 x 4.75 mm SMart Piston   TOTAL HIP ARTHROPLASTY     05/2000 R, 11/2000 L    There were no vitals filed for this visit.   Subjective Assessment - 08/17/19 1019    Subjective sometimes sitting in the car hurts. Muscles are not as achey. Still not able to get on bike at home.    Patient Stated Goals walking, decrease pain, more flexibility, get in/out of bed easier, don socks/shoes & tie shoes, get into/out of car    Currently in Pain? Yes    Pain Score 5     Pain  Location Leg    Pain Orientation Right;Left                             [facility] Adult PT Treatment/Exercise - 08/17/19 0001      Knee/Hip Exercises: Aerobic   Nustep L5 5 min LE only      Knee/Hip Exercises: Standing   Functional Squat Limitations pull off of bar to tap chair      Knee/Hip Exercises: Seated   Long Arc Quad Limitations alternating, ball bw knees      Knee/Hip Exercises: Supine   Straight Leg Raises Both;20 reps    Straight Leg Raise with External Rotation Both;20 reps      Knee/Hip Exercises: Sidelying   Hip ABduction 20 reps;Both                  PT Education - 08/17/19 1125    Education Details use of pain scale, exercise form/rationale    Person(s) Educated Patient    Methods Explanation;Demonstration;Handout    Comprehension Verbalized understanding;Returned demonstration;Need further instruction            PT Short Term Goals - 08/17/19 1026  PT SHORT TERM GOAL #1   Title pt will be indpendent with short term HEP    Status Achieved      PT SHORT TERM GOAL #2   Title pt will be able to get onto her bike at home and ride, even if not full revolutions    Baseline has tried but requires help from kids and unable to lift leg comfortably    Status Partially Met             PT Long Term Goals - 07/16/19 1304      PT LONG TERM GOAL #1   Title gross LE strength to 4/5    Baseline see flowsheet    Time 10    Period Weeks    Status New    Target Date 09/29/19      PT LONG TERM GOAL #2   Title pt will be able to get into/out of bed & car with min-no difficulty    Baseline significant difficulty reported at eval    Time 10    Period Weeks    Status New    Target Date 09/29/19      PT LONG TERM GOAL #3   Title pt will be able to ambulate while feeling safe and confident    Baseline feels nervous and off- balance at eval, antalgic pattern    Time 10    Period Weeks    Status New    Target Date 09/29/19       PT LONG TERM GOAL #4   Title pt will be independent in long term HEP    Baseline will progress as appropriate    Time 10    Period Weeks    Status New    Target Date 09/29/19                 Plan - 08/17/19 1126    Clinical Impression Statement Pt is demonstrating improved exercise tolerance with increased repetitions and improved form. Was very hesitant to perform squats today as it would hurt her Rt knee. Pt reports an 8/10 pain today but shows no visual signs of pain and is able to ambulate freely. We had a discussion to clarify use of pain scale and levels of pain associated with the numbers. this resulted in the pt agreeing that she is at about a 5/10 today. I encouraged her that we do believe she is in pain even if the number is not really high.    PT Treatment/Interventions ADLs/Self Care Home Management;Cryotherapy;Moist Heat;Gait training;Stair training;Functional mobility training;Therapeutic activities;Therapeutic exercise;Balance training;Patient/family education;Neuromuscular re-education;Manual techniques;Taping;Passive range of motion;Dry needling    PT Next Visit Plan continue to challenge muscular activation in lumbopelvic region, CKC strengthening    PT Home Exercise Plan [id number] : seated heelslides, ball squeeze seated, seated hamsting stretch, standing calf stretch, supine hip abduction, bike pendulum, seated march, seated LAQ    Consulted and Agree with Plan of Care Patient           Patient will benefit from skilled therapeutic intervention in order to improve the following deficits and impairments:  Abnormal gait, Improper body mechanics, Pain, Postural dysfunction, Increased muscle spasms, Decreased activity tolerance, Decreased range of motion, Decreased endurance, Decreased strength, Decreased balance, Difficulty walking, Impaired flexibility  Visit Diagnosis: Pain in left hip  Stiffness of right hip, not elsewhere classified  Stiffness of left hip, not  elsewhere classified     Problem List Patient Active Problem List   Diagnosis  Date Noted   Left hip pain 06/26/2019   Muscle strain of lower extremity, left, initial encounter 06/26/2019   Healthcare maintenance 06/26/2019   Iron deficiency anemia 04/14/2018   Hematuria, gross 03/29/2018   Iron deficiency anemia due to chronic blood loss 03/29/2018   Long term current use of anticoagulant 03/29/2018   Antiphospholipid antibody syndrome ([place]) 01/25/2018   Morbid obesity ([place]) 07/26/2017   Systemic lupus erythematosus ([place]) 07/08/2017   Hx pulmonary embolism 07/08/2017   Chronic pain of right knee 05/23/2017   Renal mass 01/26/2017   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/21/2016   Vitamin D deficiency 12/16/2016   Anemia    Migraine   Lillyanne Bradburn C. Larah Kuntzman PT, DPT 08/17/19 11:28 AM   [facility] [address] [place], [place], [address] Phone: [phone]   Fax:  [phone]  Name: Melissa Gaines MRN: [id number] Date of Birth: 11-13-1970

## 2019-08-27 ENCOUNTER — Other Ambulatory Visit: Payer: Self-pay

## 2019-08-27 ENCOUNTER — Inpatient Hospital Stay: Payer: Medicare Other | Attending: Nurse Practitioner

## 2019-08-27 VITALS — BP 109/47 | HR 92 | Temp 98.1°F | Resp 18

## 2019-08-27 DIAGNOSIS — Z79899 Other long term (current) drug therapy: Secondary | ICD-10-CM | POA: Insufficient documentation

## 2019-08-27 DIAGNOSIS — E538 Deficiency of other specified B group vitamins: Secondary | ICD-10-CM | POA: Insufficient documentation

## 2019-08-27 DIAGNOSIS — D508 Other iron deficiency anemias: Secondary | ICD-10-CM

## 2019-08-27 MED ORDER — CYANOCOBALAMIN 1000 MCG/ML IJ SOLN
1000.0000 ug | Freq: Once | INTRAMUSCULAR | Status: AC
  Administered 2019-08-27: 1000 ug via INTRAMUSCULAR

## 2019-08-27 MED ORDER — ALBUTEROL SULFATE HFA 108 (90 BASE) MCG/ACT IN AERS: 2.0000 | INHALATION_SPRAY | Freq: Four times a day (QID) | RESPIRATORY_TRACT | 3 refills | Status: DC | PRN

## 2019-08-27 NOTE — Progress Notes (Signed)
Patient calls nurse line reporting that she never received her albuterol inhaler. Upon chart review, rx was sent in as "print", resent as "normal". After resending, rx reverts back to print. Called pharmacy and gave verbal order for medication.   Patient informed.   Talbot Grumbling, RN

## 2019-08-29 ENCOUNTER — Ambulatory Visit: Payer: Medicare Other | Admitting: Physical Therapy

## 2019-09-05 ENCOUNTER — Other Ambulatory Visit: Payer: Self-pay

## 2019-09-05 ENCOUNTER — Ambulatory Visit: Payer: Medicare Other | Admitting: Physical Therapy

## 2019-09-05 ENCOUNTER — Encounter: Payer: Self-pay | Admitting: Physical Therapy

## 2019-09-05 DIAGNOSIS — M25552 Pain in left hip: Secondary | ICD-10-CM

## 2019-09-05 DIAGNOSIS — M25651 Stiffness of right hip, not elsewhere classified: Secondary | ICD-10-CM | POA: Diagnosis not present

## 2019-09-05 DIAGNOSIS — M25652 Stiffness of left hip, not elsewhere classified: Secondary | ICD-10-CM | POA: Diagnosis not present

## 2019-09-05 DIAGNOSIS — M6281 Muscle weakness (generalized): Secondary | ICD-10-CM | POA: Diagnosis not present

## 2019-09-05 DIAGNOSIS — R262 Difficulty in walking, not elsewhere classified: Secondary | ICD-10-CM | POA: Diagnosis not present

## 2019-09-05 NOTE — Therapy (Signed)
[place], [place], [address] Phone: [phone]   Fax:  [phone]  Physical Therapy Treatment  Patient Details  Name: Melissa Gaines MRN: [id number] Date of Birth: [birth year] Referring Provider (PT): Chauncey Reading, MD   Encounter Date: 09/05/2019   PT End of Session - 09/05/19 0722    Visit Number 7    Number of Visits 21    Date for PT Re-Evaluation 09/29/19    Authorization Type UHC MCR    PT Start Time 0715    PT Stop Time 0759    PT Time Calculation (min) 44 min           Past Medical History:  Diagnosis Date  . Anemia   . Anxiety   . Asthma   . Chronic pain   . Congenital hip dislocation   . GERD (gastroesophageal reflux disease)   . Iron deficiency anemia 04/14/2018  . Kidney mass 2017  . Migraine   . Osteoporosis   . Pathological dislocation of shoulder joint, bilateral    congential  . PTSD (post-traumatic stress disorder)   . Sleep apnea   . Systemic lupus erythematosus ([place])     Past Surgical History:  Procedure Laterality Date  . CESAREAN SECTION  2005  . CESAREAN SECTION W/BTL  2010  . HIP SURGERY     in childhood for congenital hip dislocation  . [place] RESECTION  2014  . STAPEDECTOMY  11/01/2011   L postauricular stapedectomy with CO2 laser and insertion of 6 x 4.75 mm SMart Piston  . TOTAL HIP ARTHROPLASTY     05/2000 R, 11/2000 L    There were no vitals filed for this visit.   Subjective Assessment - 09/05/19 0720    Currently in Pain? Yes    Pain Score 7     Pain Location --   right knee and lower back   Pain Orientation Right;Lower    Pain Descriptors / Indicators Aching;Sore    Pain Type Chronic pain    Aggravating Factors  weather    Pain Relieving Factors massaging it, stretches                             [facility] Adult PT Treatment/Exercise - 09/05/19 0001      Knee/Hip Exercises: Stretches   Active  Hamstring Stretch 3 reps;30 seconds    Active Hamstring Stretch Limitations supine with strap and assist     Hip Flexor Stretch --    ITB Stretch 3 reps;30 seconds    ITB Stretch Limitations supine with strap and assist     Gastroc Stretch 1 rep;30 seconds    Gastroc Stretch Limitations runners stretch      Knee/Hip Exercises: Aerobic   Recumbent Bike partial revolutions x 3 minutes     Nustep L5 7 min LE only      Knee/Hip Exercises: Seated   Long Arc Quad 20 reps    Long Arc Quad Limitations alternating, ball bw knees    Clamshell with TheraBand Green   20 reps  x 3    Marching 20 reps    Marching Limitations alternating     Hamstring Curl 15 reps    Hamstring Limitations 2 set seach  with green band     Sit to Sand 10 reps;20 reps;without UE support    from mat table and foam pad, with 4 inch step under feet  Knee/Hip Exercises: Supine   Bridges with Diona Foley Squeeze 20 reps    Straight Leg Raises 10 reps    Straight Leg Raises Limitations 2 sets each     Straight Leg Raise with External Rotation 10 reps    Straight Leg Raise with External Rotation Limitations 2 sets each      Knee/Hip Exercises: Sidelying   Hip ABduction 20 reps;Both                    PT Short Term Goals - 08/17/19 1026      PT SHORT TERM GOAL #1   Title pt will be indpendent with short term HEP    Status Achieved      PT SHORT TERM GOAL #2   Title pt will be able to get onto her bike at home and ride, even if not full revolutions    Baseline has tried but requires help from kids and unable to lift leg comfortably    Status Partially Met             PT Long Term Goals - 07/16/19 1304      PT LONG TERM GOAL #1   Title gross LE strength to 4/5    Baseline see flowsheet    Time 10    Period Weeks    Status New    Target Date 09/29/19      PT LONG TERM GOAL #2   Title pt will be able to get into/out of bed & car with min-no difficulty    Baseline significant difficulty reported  at eval    Time 10    Period Weeks    Status New    Target Date 09/29/19      PT LONG TERM GOAL #3   Title pt will be able to ambulate while feeling safe and confident    Baseline feels nervous and off- balance at eval, antalgic pattern    Time 10    Period Weeks    Status New    Target Date 09/29/19      PT LONG TERM GOAL #4   Title pt will be independent in long term HEP    Baseline will progress as appropriate    Time 10    Period Weeks    Status New    Target Date 09/29/19                 Plan - 09/05/19 0751    Clinical Impression Statement Pt reports some days are better but today she is having more pain. She is still able to participate in therex and complete all exercises and reps as requested. Performed rec bike partial revolutions, needs assist to get LEs in pedals. I encouraged her to attempt her rec bike at home. She reports difficulty getting right knee to bend to go around and I asked her to try partial revolutions.    Examination-Activity Limitations Bathing;Stand;Locomotion Level;Bed Mobility;Bend;Transfers;Caring for Others;Sit;Carry;Sleep;Dressing;Squat;Stairs    PT Next Visit Plan continue to challenge muscular activation in lumbopelvic region, CKC strengthening , did she try partial revolutions on her bike?    PT Home Exercise Plan [id number] : seated heelslides, ball squeeze seated, seated hamsting stretch, standing calf stretch, supine hip abduction, bike pendulum, seated march, seated LAQ, added SLR, side hip abduction and green band hamstring curls.    Consulted and Agree with Plan of Care Patient           Patient will benefit from  skilled therapeutic intervention in order to improve the following deficits and impairments:  Abnormal gait, Improper body mechanics, Pain, Postural dysfunction, Increased muscle spasms, Decreased activity tolerance, Decreased range of motion, Decreased endurance, Decreased strength, Decreased balance, Difficulty walking,  Impaired flexibility  Visit Diagnosis: Pain in left hip  Stiffness of right hip, not elsewhere classified  Stiffness of left hip, not elsewhere classified  Difficulty in walking, not elsewhere classified  Muscle weakness (generalized)     Problem List Patient Active Problem List   Diagnosis Date Noted  . Left hip pain 06/26/2019  . Muscle strain of lower extremity, left, initial encounter 06/26/2019  . Healthcare maintenance 06/26/2019  . Iron deficiency anemia 04/14/2018  . Hematuria, gross 03/29/2018  . Iron deficiency anemia due to chronic blood loss 03/29/2018  . Long term current use of anticoagulant 03/29/2018  . Antiphospholipid antibody syndrome ([place]) 01/25/2018  . Morbid obesity ([place]) 07/26/2017  . Systemic lupus erythematosus ([place]) 07/08/2017  . Hx pulmonary embolism 07/08/2017  . Chronic pain of right knee 05/23/2017  . Renal mass 01/26/2017  . 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/21/2016  . Vitamin D deficiency 12/16/2016  . Anemia   . Migraine     Dorene Ar, [name] 09/05/2019, 8:18 AM  [facility] [address] [place], [place], [address] Phone: [phone]   Fax:  [phone]  Name: Melissa Gaines MRN: [id number] Date of Birth: [birth year]

## 2019-09-11 ENCOUNTER — Ambulatory Visit: Payer: Medicare Other | Admitting: Physical Therapy

## 2019-09-11 ENCOUNTER — Encounter: Payer: Medicare Other | Admitting: Physical Therapy

## 2019-09-13 ENCOUNTER — Other Ambulatory Visit: Payer: Self-pay

## 2019-09-13 ENCOUNTER — Ambulatory Visit: Payer: Medicare Other | Attending: Family Medicine | Admitting: Physical Therapy

## 2019-09-13 DIAGNOSIS — M6281 Muscle weakness (generalized): Secondary | ICD-10-CM

## 2019-09-13 DIAGNOSIS — M25652 Stiffness of left hip, not elsewhere classified: Secondary | ICD-10-CM | POA: Diagnosis not present

## 2019-09-13 DIAGNOSIS — R262 Difficulty in walking, not elsewhere classified: Secondary | ICD-10-CM | POA: Diagnosis not present

## 2019-09-13 DIAGNOSIS — M25552 Pain in left hip: Secondary | ICD-10-CM

## 2019-09-13 DIAGNOSIS — M25651 Stiffness of right hip, not elsewhere classified: Secondary | ICD-10-CM | POA: Diagnosis not present

## 2019-09-13 NOTE — Therapy (Signed)
[place], [place], [address] Phone: [phone]   Fax:  [phone]  Physical Therapy Treatment  Patient Details  Name: Melissa Gaines MRN: [id number] Date of Birth: 08/21/1970 Referring Provider (PT): Chauncey Reading, MD   Encounter Date: 09/13/2019   PT End of Session - 09/13/19 1008    Visit Number 8    Number of Visits 21    Date for PT Re-Evaluation 09/29/19    Authorization Type UHC MCR    PT Start Time 0930    PT Stop Time 1010    PT Time Calculation (min) 40 min           Past Medical History:  Diagnosis Date  . Anemia   . Anxiety   . Asthma   . Chronic pain   . Congenital hip dislocation   . GERD (gastroesophageal reflux disease)   . Iron deficiency anemia 04/14/2018  . Kidney mass 2017  . Migraine   . Osteoporosis   . Pathological dislocation of shoulder joint, bilateral    congential  . PTSD (post-traumatic stress disorder)   . Sleep apnea   . Systemic lupus erythematosus ([place])     Past Surgical History:  Procedure Laterality Date  . CESAREAN SECTION  2005  . CESAREAN SECTION W/BTL  2010  . HIP SURGERY     in childhood for congenital hip dislocation  . [place] RESECTION  2014  . STAPEDECTOMY  11/01/2011   L postauricular stapedectomy with CO2 laser and insertion of 6 x 4.75 mm SMart Piston  . TOTAL HIP ARTHROPLASTY     05/2000 R, 11/2000 L    There were no vitals filed for this visit.   Subjective Assessment - 09/13/19 1007    Subjective Pt reports 10/10  pain today due to the weather.    Currently in Pain? Yes    Pain Score 10-Worst pain ever    Pain Location Back   and right hip4   Pain Orientation Right;Lower    Pain Descriptors / Indicators Aching;Sore    Pain Type Chronic pain    Aggravating Factors  weather    Pain Relieving Factors massaging it                             [facility] Adult PT Treatment/Exercise -  09/13/19 0001      Knee/Hip Exercises: Stretches   Active Hamstring Stretch 3 reps;30 seconds    Active Hamstring Stretch Limitations supine with strap and assist     Hip Flexor Stretch 3 reps;30 seconds    Hip Flexor Stretch Limitations right     ITB Stretch 3 reps;30 seconds    ITB Stretch Limitations supine with strap and assist       Knee/Hip Exercises: Aerobic   Nustep L5 7 min LE only      Knee/Hip Exercises: Seated   Long Arc Quad 20 reps    Long Arc Quad Limitations alternating, ball bw knees    Marching 20 reps    Marching Limitations alternating     Hamstring Curl 20 reps    Hamstring Limitations green , each     Sit to Sand 10 reps;without UE support   mat table , the x 10 with 2 inch step under feet      Knee/Hip Exercises: Supine   Bridges with [employer] 20 reps    Straight Leg Raises 10 reps  Straight Leg Raise with External Rotation 10 reps    Other Supine Knee/Hip Exercises green band marching  x 20     Other Supine Knee/Hip Exercises green band clam x 40 reps       Knee/Hip Exercises: Sidelying   Hip ABduction 10 reps    Hip ABduction Limitations Rt/Lt     Clams x 10 each                     PT Short Term Goals - 08/17/19 1026      PT SHORT TERM GOAL #1   Title pt will be indpendent with short term HEP    Status Achieved      PT SHORT TERM GOAL #2   Title pt will be able to get onto her bike at home and ride, even if not full revolutions    Baseline has tried but requires help from kids and unable to lift leg comfortably    Status Partially Met             PT Long Term Goals - 07/16/19 1304      PT LONG TERM GOAL #1   Title gross LE strength to 4/5    Baseline see flowsheet    Time 10    Period Weeks    Status New    Target Date 09/29/19      PT LONG TERM GOAL #2   Title pt will be able to get into/out of bed & car with min-no difficulty    Baseline significant difficulty reported at eval    Time 10    Period Weeks     Status New    Target Date 09/29/19      PT LONG TERM GOAL #3   Title pt will be able to ambulate while feeling safe and confident    Baseline feels nervous and off- balance at eval, antalgic pattern    Time 10    Period Weeks    Status New    Target Date 09/29/19      PT LONG TERM GOAL #4   Title pt will be independent in long term HEP    Baseline will progress as appropriate    Time 10    Period Weeks    Status New    Target Date 09/29/19                 Plan - 09/13/19 1018    Clinical Impression Statement Pt reports rec bike at home is still too difficult to get on and perform even with help from family. She reports 10/10 pain due to rainy weather today. She is able to participate and complete all therex as requested without c/o increased pain. She needs a FOTO status.    PT Next Visit Plan GET FOTO status, check goals. continue to challenge muscular activation in lumbopelvic region, CKC strengthening , did she try partial revolutions on her bike?    PT Home Exercise Plan [id number] : seated heelslides, ball squeeze seated, seated hamsting stretch, standing calf stretch, supine hip abduction, bike pendulum, seated march, seated LAQ, added SLR, side hip abduction and green band hamstring curls.           Patient will benefit from skilled therapeutic intervention in order to improve the following deficits and impairments:  Abnormal gait, Improper body mechanics, Pain, Postural dysfunction, Increased muscle spasms, Decreased activity tolerance, Decreased range of motion, Decreased endurance, Decreased strength, Decreased balance, Difficulty walking,  Impaired flexibility  Visit Diagnosis: Pain in left hip  Stiffness of right hip, not elsewhere classified  Stiffness of left hip, not elsewhere classified  Difficulty in walking, not elsewhere classified  Muscle weakness (generalized)     Problem List Patient Active Problem List   Diagnosis Date Noted  . Left hip pain  06/26/2019  . Muscle strain of lower extremity, left, initial encounter 06/26/2019  . Healthcare maintenance 06/26/2019  . Iron deficiency anemia 04/14/2018  . Hematuria, gross 03/29/2018  . Iron deficiency anemia due to chronic blood loss 03/29/2018  . Long term current use of anticoagulant 03/29/2018  . Antiphospholipid antibody syndrome ([place]) 01/25/2018  . Morbid obesity ([place]) 07/26/2017  . Systemic lupus erythematosus ([place]) 07/08/2017  . Hx pulmonary embolism 07/08/2017  . Chronic pain of right knee 05/23/2017  . Renal mass 01/26/2017  . 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/21/2016  . Vitamin D deficiency 12/16/2016  . Anemia   . Migraine     Dorene Ar, [name] 09/13/2019, 10:20 AM  [facility] [address] [place], [place], [address] Phone: [phone]   Fax:  [phone]  Name: Eleesha Purkey MRN: [id number] Date of Birth: 09/30/1970

## 2019-09-17 ENCOUNTER — Ambulatory Visit: Payer: Medicare Other | Admitting: Physical Therapy

## 2019-09-17 ENCOUNTER — Other Ambulatory Visit: Payer: Self-pay

## 2019-09-17 ENCOUNTER — Encounter: Payer: Self-pay | Admitting: Physical Therapy

## 2019-09-17 DIAGNOSIS — R262 Difficulty in walking, not elsewhere classified: Secondary | ICD-10-CM

## 2019-09-17 DIAGNOSIS — M25552 Pain in left hip: Secondary | ICD-10-CM

## 2019-09-17 DIAGNOSIS — M25651 Stiffness of right hip, not elsewhere classified: Secondary | ICD-10-CM | POA: Diagnosis not present

## 2019-09-17 DIAGNOSIS — M25652 Stiffness of left hip, not elsewhere classified: Secondary | ICD-10-CM

## 2019-09-17 DIAGNOSIS — M6281 Muscle weakness (generalized): Secondary | ICD-10-CM | POA: Diagnosis not present

## 2019-09-17 NOTE — Therapy (Cosign Needed)
[place], [place], [address] Phone: [phone]   Fax:  [phone]  Physical Therapy Treatment  Patient Details  Name: Melissa Gaines MRN: [id number] Date of Birth: 24-Jun-1970 Referring Provider (PT): Chauncey Reading, MD   Encounter Date: 09/17/2019   PT End of Session - 09/17/19 0852    Visit Number 9    Number of Visits 21    Date for PT Re-Evaluation 09/29/19    PT Start Time 3016    PT Stop Time 0927    PT Time Calculation (min) 40 min    Activity Tolerance Patient tolerated treatment well    Behavior During Therapy [facility] for tasks assessed/performed           Past Medical History:  Diagnosis Date  . Anemia   . Anxiety   . Asthma   . Chronic pain   . Congenital hip dislocation   . GERD (gastroesophageal reflux disease)   . Iron deficiency anemia 04/14/2018  . Kidney mass 2017  . Migraine   . Osteoporosis   . Pathological dislocation of shoulder joint, bilateral    congential  . PTSD (post-traumatic stress disorder)   . Sleep apnea   . Systemic lupus erythematosus ([place])     Past Surgical History:  Procedure Laterality Date  . CESAREAN SECTION  2005  . CESAREAN SECTION W/BTL  2010  . HIP SURGERY     in childhood for congenital hip dislocation  . [place] RESECTION  2014  . STAPEDECTOMY  11/01/2011   L postauricular stapedectomy with CO2 laser and insertion of 6 x 4.75 mm SMart Piston  . TOTAL HIP ARTHROPLASTY     05/2000 R, 11/2000 L    There were no vitals filed for this visit.   Subjective Assessment - 09/17/19 0851    Subjective Patient reports she is in a lot of pain today 8/10. States the lower back is hurting her more today than her hip. Pain was consistent over the weekend at 8/10.    How long can you sit comfortably? 15 min and then have to shift    How long can you stand comfortably? <15 min    How long can you walk comfortably? about 30 feet  to bathroom    Patient Stated Goals walking, decrease pain, more flexibility, get in/out of bed easier, don socks/shoes & tie shoes, get into/out of car    Currently in Pain? No/denies    Pain Score 8     Pain Location Back   and right hip   Pain Orientation Right;Lower    Pain Descriptors / Indicators Aching;Sore    Pain Type Chronic pain                             [facility] Adult PT Treatment/Exercise - 09/17/19 0001      Knee/Hip Exercises: Stretches   Active Hamstring Stretch 3 reps;30 seconds    Active Hamstring Stretch Limitations supine with strap and assist     Hip Flexor Stretch 3 reps;30 seconds    Hip Flexor Stretch Limitations right     ITB Stretch --    ITB Stretch Limitations --      Knee/Hip Exercises: Aerobic   Nustep L4 x 5 min LE only      Knee/Hip Exercises: Standing   Heel Raises 20 reps    Hip Flexion Limitations alternating march at counter x 20  Hip Abduction 10 reps   red   Hip Extension 10 reps    Forward Step Up Right;Left;1 set;10 reps;Step Height: 4"    Functional Squat Limitations 15 reps      Knee/Hip Exercises: Seated   Long Arc Quad 20 reps    Long Arc Quad Limitations alternating, ball bw knees    Marching 20 reps    Hamstring Curl 20 reps    Hamstring Limitations green , each     Sit to Sand 10 reps;without UE support   mat table , the x 10 with 2 inch step under feet      Knee/Hip Exercises: Supine   Bridges with [employer] 20 reps    Straight Leg Raises 10 reps    Straight Leg Raise with External Rotation 10 reps    Other Supine Knee/Hip Exercises green band clam x 40 reps       Knee/Hip Exercises: Sidelying   Hip ABduction 10 reps    Hip ABduction Limitations Rt/Lt                   PT Education - 09/17/19 1247    Education Details importance of HEP    Person(s) Educated Patient    Methods Explanation;Demonstration;Tactile cues;Verbal cues    Comprehension Tactile cues required;Verbal cues  required;Returned demonstration;Verbalized understanding            PT Short Term Goals - 08/17/19 1026      PT SHORT TERM GOAL #1   Title pt will be indpendent with short term HEP    Status Achieved      PT SHORT TERM GOAL #2   Title pt will be able to get onto her bike at home and ride, even if not full revolutions    Baseline has tried but requires help from kids and unable to lift leg comfortably    Status Partially Met             PT Long Term Goals - 07/16/19 1304      PT LONG TERM GOAL #1   Title gross LE strength to 4/5    Baseline see flowsheet    Time 10    Period Weeks    Status New    Target Date 09/29/19      PT LONG TERM GOAL #2   Title pt will be able to get into/out of bed & car with min-no difficulty    Baseline significant difficulty reported at eval    Time 10    Period Weeks    Status New    Target Date 09/29/19      PT LONG TERM GOAL #3   Title pt will be able to ambulate while feeling safe and confident    Baseline feels nervous and off- balance at eval, antalgic pattern    Time 10    Period Weeks    Status New    Target Date 09/29/19      PT LONG TERM GOAL #4   Title pt will be independent in long term HEP    Baseline will progress as appropriate    Time 10    Period Weeks    Status New    Target Date 09/29/19                 Plan - 09/17/19 1039    Clinical Impression Statement Patient was able to participate in all ther-ex exercises without c/o increased pain. Therapy continued similar exercises  from last session, working on hip, quad, and glute strengthening. Patient tolerated treatment well. therapy will continue progressing exercises as tolerated.    Personal Factors and Comorbidities Fitness;Past/Current Experience;Comorbidity 3+    Comorbidities obesity, repeated dislocations, h/o THA & revision, OP    Examination-Activity Limitations Bathing;Stand;Locomotion Level;Bed Mobility;Bend;Transfers;Caring for  Others;Sit;Carry;Sleep;Dressing;Squat;Stairs    Examination-Participation Restrictions Laundry;Driving;Shop;Cleaning;Other    Stability/Clinical Decision Making Unstable/Unpredictable    Clinical Decision Making High    Rehab Potential Good    PT Frequency 2x / week    PT Treatment/Interventions ADLs/Self Care Home Management;Cryotherapy;Moist Heat;Gait training;Stair training;Functional mobility training;Therapeutic activities;Therapeutic exercise;Balance training;Patient/family education;Neuromuscular re-education;Manual techniques;Taping;Passive range of motion;Dry needling    PT Next Visit Plan GET FOTO status, check goals. continue to challenge muscular activation in lumbopelvic region, CKC strengthening , did she try partial revolutions on her bike?    PT Home Exercise Plan [id number] : seated heelslides, ball squeeze seated, seated hamsting stretch, standing calf stretch, supine hip abduction, bike pendulum, seated march, seated LAQ, added SLR, side hip abduction and green band hamstring curls.    Consulted and Agree with Plan of Care Patient           Patient will benefit from skilled therapeutic intervention in order to improve the following deficits and impairments:  Abnormal gait, Improper body mechanics, Pain, Postural dysfunction, Increased muscle spasms, Decreased activity tolerance, Decreased range of motion, Decreased endurance, Decreased strength, Decreased balance, Difficulty walking, Impaired flexibility  Visit Diagnosis: Pain in left hip  Stiffness of right hip, not elsewhere classified  Stiffness of left hip, not elsewhere classified  Difficulty in walking, not elsewhere classified  Muscle weakness (generalized)     Problem List Patient Active Problem List   Diagnosis Date Noted  . Left hip pain 06/26/2019  . Muscle strain of lower extremity, left, initial encounter 06/26/2019  . Healthcare maintenance 06/26/2019  . Iron deficiency anemia 04/14/2018  .  Hematuria, gross 03/29/2018  . Iron deficiency anemia due to chronic blood loss 03/29/2018  . Long term current use of anticoagulant 03/29/2018  . Antiphospholipid antibody syndrome ([place]) 01/25/2018  . Morbid obesity ([place]) 07/26/2017  . Systemic lupus erythematosus ([place]) 07/08/2017  . Hx pulmonary embolism 07/08/2017  . Chronic pain of right knee 05/23/2017  . Renal mass 01/26/2017  . 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/21/2016  . Vitamin D deficiency 12/16/2016  . Anemia   . Migraine     Carney Living  PT DPT  09/17/2019, 1:10 PM   Minna Merritts SPT 09/17/2019  During this treatment session, the therapist was present, participating in and directing the treatment.   [place], [place], [address] Phone: [phone]   Fax:  [phone]  Name: Jacoya Bauman MRN: [id number] Date of Birth: 1970/07/23

## 2019-09-19 ENCOUNTER — Encounter: Payer: Self-pay | Admitting: Physical Therapy

## 2019-09-19 ENCOUNTER — Other Ambulatory Visit: Payer: Self-pay

## 2019-09-19 ENCOUNTER — Ambulatory Visit: Payer: Medicare Other | Admitting: Physical Therapy

## 2019-09-19 DIAGNOSIS — M25651 Stiffness of right hip, not elsewhere classified: Secondary | ICD-10-CM

## 2019-09-19 DIAGNOSIS — R262 Difficulty in walking, not elsewhere classified: Secondary | ICD-10-CM

## 2019-09-19 DIAGNOSIS — M25552 Pain in left hip: Secondary | ICD-10-CM | POA: Diagnosis not present

## 2019-09-19 DIAGNOSIS — M25652 Stiffness of left hip, not elsewhere classified: Secondary | ICD-10-CM

## 2019-09-19 DIAGNOSIS — M6281 Muscle weakness (generalized): Secondary | ICD-10-CM | POA: Diagnosis not present

## 2019-09-19 NOTE — Therapy (Signed)
[place], [place], [address] Phone: [phone]   Fax:  [phone]  Physical Therapy Treatment  Patient Details  Name: Melissa Gaines MRN: [id number] Date of Birth: [birth year] Referring Provider (PT): Chauncey Reading, MD   Encounter Date: 09/19/2019   PT End of Session - 09/19/19 1243    Visit Number 10    Number of Visits 21    Date for PT Re-Evaluation 09/29/19    Authorization Type UHC MCR    PT Start Time 1240    PT Stop Time 1310    PT Time Calculation (min) 30 min           Past Medical History:  Diagnosis Date  . Anemia   . Anxiety   . Asthma   . Chronic pain   . Congenital hip dislocation   . GERD (gastroesophageal reflux disease)   . Iron deficiency anemia 04/14/2018  . Kidney mass 2017  . Migraine   . Osteoporosis   . Pathological dislocation of shoulder joint, bilateral    congential  . PTSD (post-traumatic stress disorder)   . Sleep apnea   . Systemic lupus erythematosus ([place])     Past Surgical History:  Procedure Laterality Date  . CESAREAN SECTION  2005  . CESAREAN SECTION W/BTL  2010  . HIP SURGERY     in childhood for congenital hip dislocation  . [place] RESECTION  2014  . STAPEDECTOMY  11/01/2011   L postauricular stapedectomy with CO2 laser and insertion of 6 x 4.75 mm SMart Piston  . TOTAL HIP ARTHROPLASTY     05/2000 R, 11/2000 L    There were no vitals filed for this visit.   Subjective Assessment - 09/19/19 1242    Subjective I am not really in pain pain. My low back hurts with prolonged sitting. I am always in pan but today it is pain that I can handle.    Currently in Pain? No/denies              [facility] PT Assessment - 09/19/19 0001      Observation/Other Assessments   Focus on Therapeutic Outcomes (FOTO)  71% limited improved from 93% limited                          [facility] Adult PT Treatment/Exercise -  09/19/19 0001      Knee/Hip Exercises: Aerobic   Nustep L5 5 min LE only      Knee/Hip Exercises: Standing   Hip Flexion Limitations single leg marching x 20 each facing each way    Forward Step Up 10 reps;Hand Hold: 1;Step Height: 6"    Functional Squat Limitations 15 reps   at sink      Knee/Hip Exercises: Seated   Long Arc Quad 20 reps    Long Arc Quad Weight 3 lbs.    Marching 20 reps    Marching Limitations alternating     Hamstring Curl 20 reps    Hamstring Limitations green , each     Sit to Sand 10 reps;without UE support   mat table , the x 10 with 2 inch step under feet      Knee/Hip Exercises: Supine   Straight Leg Raises 10 reps    Straight Leg Raises Limitations 2 sets each    Straight Leg Raise with External Rotation 10 reps    Straight Leg Raise with External Rotation Limitations  2 sets each       Knee/Hip Exercises: Sidelying   Hip ABduction 20 reps                    PT Short Term Goals - 08/17/19 1026      PT SHORT TERM GOAL #1   Title pt will be indpendent with short term HEP    Status Achieved      PT SHORT TERM GOAL #2   Title pt will be able to get onto her bike at home and ride, even if not full revolutions    Baseline has tried but requires help from kids and unable to lift leg comfortably    Status Partially Met             PT Long Term Goals - 07/16/19 1304      PT LONG TERM GOAL #1   Title gross LE strength to 4/5    Baseline see flowsheet    Time 10    Period Weeks    Status New    Target Date 09/29/19      PT LONG TERM GOAL #2   Title pt will be able to get into/out of bed & car with min-no difficulty    Baseline significant difficulty reported at eval    Time 10    Period Weeks    Status New    Target Date 09/29/19      PT LONG TERM GOAL #3   Title pt will be able to ambulate while feeling safe and confident    Baseline feels nervous and off- balance at eval, antalgic pattern    Time 10    Period Weeks     Status New    Target Date 09/29/19      PT LONG TERM GOAL #4   Title pt will be independent in long term HEP    Baseline will progress as appropriate    Time 10    Period Weeks    Status New    Target Date 09/29/19                 Plan - 09/19/19 1310    Clinical Impression Statement Pt arrives with young son who has to wait outside due to [place] [facility] visitor restrictions. She requested a shorter session today. She reports improvement in confidence while walking. FOTO score improved from 93% limited to 71% limited. Continued with hip and LE strengthening. Pt would benefit from therapeutic activites to improve her perception of limitations. Plan to work on lifting grocerries from the floor and othe heavy household chores she finds diffiicult.    PT Next Visit Plan Use FOTO status and subjective reports to work on functional activites. check LTGS    PT Home Exercise Plan [id number] : seated heelslides, ball squeeze seated, seated hamsting stretch, standing calf stretch, supine hip abduction, bike pendulum, seated march, seated LAQ, added SLR, side hip abduction and green band hamstring curls.           Patient will benefit from skilled therapeutic intervention in order to improve the following deficits and impairments:  Abnormal gait, Improper body mechanics, Pain, Postural dysfunction, Increased muscle spasms, Decreased activity tolerance, Decreased range of motion, Decreased endurance, Decreased strength, Decreased balance, Difficulty walking, Impaired flexibility  Visit Diagnosis: Pain in left hip  Stiffness of right hip, not elsewhere classified  Stiffness of left hip, not elsewhere classified  Difficulty in walking, not elsewhere classified  Muscle weakness (  generalized)     Problem List Patient Active Problem List   Diagnosis Date Noted  . Left hip pain 06/26/2019  . Muscle strain of lower extremity, left, initial encounter 06/26/2019  . Healthcare maintenance  06/26/2019  . Iron deficiency anemia 04/14/2018  . Hematuria, gross 03/29/2018  . Iron deficiency anemia due to chronic blood loss 03/29/2018  . Long term current use of anticoagulant 03/29/2018  . Antiphospholipid antibody syndrome ([place]) 01/25/2018  . Morbid obesity ([place]) 07/26/2017  . Systemic lupus erythematosus ([place]) 07/08/2017  . Hx pulmonary embolism 07/08/2017  . Chronic pain of right knee 05/23/2017  . Renal mass 01/26/2017  . 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/21/2016  . Vitamin D deficiency 12/16/2016  . Anemia   . Migraine     Dorene Ar 09/19/2019, 1:14 PM  [facility] [address] [place], [place], [address] Phone: [phone]   Fax:  [phone]  Name: Melissa Gaines MRN: [id number] Date of Birth: 04/01/1970

## 2019-09-24 ENCOUNTER — Ambulatory Visit: Payer: Medicare Other | Admitting: Physical Therapy

## 2019-09-24 ENCOUNTER — Encounter: Payer: Self-pay | Admitting: Physical Therapy

## 2019-09-24 ENCOUNTER — Other Ambulatory Visit: Payer: Self-pay

## 2019-09-24 DIAGNOSIS — M6281 Muscle weakness (generalized): Secondary | ICD-10-CM | POA: Diagnosis not present

## 2019-09-24 DIAGNOSIS — M25652 Stiffness of left hip, not elsewhere classified: Secondary | ICD-10-CM

## 2019-09-24 DIAGNOSIS — M25552 Pain in left hip: Secondary | ICD-10-CM | POA: Diagnosis not present

## 2019-09-24 DIAGNOSIS — R262 Difficulty in walking, not elsewhere classified: Secondary | ICD-10-CM

## 2019-09-24 DIAGNOSIS — M25651 Stiffness of right hip, not elsewhere classified: Secondary | ICD-10-CM

## 2019-09-24 NOTE — Therapy (Signed)
[place], [place], [address] Phone: [phone]   Fax:  [phone]  Physical Therapy Treatment  Patient Details  Name: Melissa Gaines MRN: [id number] Date of Birth: 24-Jan-1971 Referring Provider (PT): Chauncey Reading, MD   Encounter Date: 09/24/2019   PT End of Session - 09/24/19 0939    Visit Number 11    Number of Visits 21    Date for PT Re-Evaluation 09/29/19    Authorization Type UHC MCR    PT Start Time 0935    PT Stop Time 1013    PT Time Calculation (min) 38 min           Past Medical History:  Diagnosis Date  . Anemia   . Anxiety   . Asthma   . Chronic pain   . Congenital hip dislocation   . GERD (gastroesophageal reflux disease)   . Iron deficiency anemia 04/14/2018  . Kidney mass 2017  . Migraine   . Osteoporosis   . Pathological dislocation of shoulder joint, bilateral    congential  . PTSD (post-traumatic stress disorder)   . Sleep apnea   . Systemic lupus erythematosus ([place])     Past Surgical History:  Procedure Laterality Date  . CESAREAN SECTION  2005  . CESAREAN SECTION W/BTL  2010  . HIP SURGERY     in childhood for congenital hip dislocation  . [place] RESECTION  2014  . STAPEDECTOMY  11/01/2011   L postauricular stapedectomy with CO2 laser and insertion of 6 x 4.75 mm SMart Piston  . TOTAL HIP ARTHROPLASTY     05/2000 R, 11/2000 L    There were no vitals filed for this visit.   Subjective Assessment - 09/24/19 0938    Subjective My body is not doing so good with this rain.    Currently in Pain? Yes    Pain Score 9     Pain Location Back    Pain Descriptors / Indicators Aching;Sore    Pain Type Chronic pain    Aggravating Factors  weather    Pain Relieving Factors massage, good weather              [facility] PT Assessment - 09/24/19 0001      Strength   Right Hip Flexion 4/5    Right Hip Extension 3-/5    Right Hip  ABduction 4-/5    Left Hip Extension 4/5                         [facility] Adult PT Treatment/Exercise - 09/24/19 0001      Knee/Hip Exercises: Aerobic   Nustep L5 6 min LE only      Knee/Hip Exercises: Standing   Heel Raises 20 reps    Hip Flexion Limitations single leg marching x 20 each facing each way    Hip Abduction 10 reps   red   Abduction Limitations 2 sets    Forward Step Up 10 reps;Hand Hold: 1;Step Height: 6"    Functional Squat Limitations 15 reps   at counter     Knee/Hip Exercises: Seated   Long Arc Quad 20 reps    Long Arc Quad Weight 3 lbs.    Marching 20 reps    Marching Limitations alternating     Hamstring Curl 20 reps    Hamstring Limitations green , each       Knee/Hip Exercises: Sidelying   Hip  ABduction 20 reps                    PT Short Term Goals - 08/17/19 1026      PT SHORT TERM GOAL #1   Title pt will be indpendent with short term HEP    Status Achieved      PT SHORT TERM GOAL #2   Title pt will be able to get onto her bike at home and ride, even if not full revolutions    Baseline has tried but requires help from kids and unable to lift leg comfortably    Status Partially Met             PT Long Term Goals - 09/24/19 1005      PT LONG TERM GOAL #1   Title gross LE strength to 4/5    Baseline see flowsheet, Left improved, right 3-/5    Time 10    Period Weeks    Status Partially Met      PT LONG TERM GOAL #2   Title pt will be able to get into/out of bed & car with min-no difficulty    Baseline significant difficulty still with getting RLE into and out of car, improved bed transfers    Time 10    Period Weeks    Status Partially Met      PT LONG TERM GOAL #3   Title pt will be able to ambulate while feeling safe and confident    Baseline much improved per patient    Time 10    Period Weeks    Status Achieved      PT LONG TERM GOAL #4   Title pt will be independent in long term HEP    Time 10     Period Weeks    Status On-going                 Plan - 09/24/19 1136    Clinical Impression Statement Pt reports increased pain today with the weather, however she is able to complete all therex with good effort and tolerance. Her Left hip strength has improved. Her right hip strength remains unchanged. She reports improved bed mobility and transitions however continued difficulty with getting in and out of vehicles due to inability to left RLE without assist.LTG#1 partially met. She dos report much improved confidence in her balance during ambulation. LTG# 3 met.    PT Next Visit Plan finalize advanced HEP over remaining visits.    PT Home Exercise Plan [id number] : seated heelslides, ball squeeze seated, seated hamsting stretch, standing calf stretch, supine hip abduction, bike pendulum, seated march, seated LAQ, added SLR, side hip abduction and green band hamstring curls.           Patient will benefit from skilled therapeutic intervention in order to improve the following deficits and impairments:  Abnormal gait, Improper body mechanics, Pain, Postural dysfunction, Increased muscle spasms, Decreased activity tolerance, Decreased range of motion, Decreased endurance, Decreased strength, Decreased balance, Difficulty walking, Impaired flexibility  Visit Diagnosis: Pain in left hip  Stiffness of right hip, not elsewhere classified  Stiffness of left hip, not elsewhere classified  Difficulty in walking, not elsewhere classified  Muscle weakness (generalized)     Problem List Patient Active Problem List   Diagnosis Date Noted  . Left hip pain 06/26/2019  . Muscle strain of lower extremity, left, initial encounter 06/26/2019  . Healthcare maintenance 06/26/2019  . Iron deficiency anemia  04/14/2018  . Hematuria, gross 03/29/2018  . Iron deficiency anemia due to chronic blood loss 03/29/2018  . Long term current use of anticoagulant 03/29/2018  . Antiphospholipid antibody  syndrome ([place]) 01/25/2018  . Morbid obesity ([place]) 07/26/2017  . Systemic lupus erythematosus ([place]) 07/08/2017  . Hx pulmonary embolism 07/08/2017  . Chronic pain of right knee 05/23/2017  . Renal mass 01/26/2017  . 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/21/2016  . Vitamin D deficiency 12/16/2016  . Anemia   . Migraine     Dorene Ar, [name] 09/24/2019, 11:40 AM  [facility] [address] [place], [place], [address] Phone: [phone]   Fax:  [phone]  Name: Melissa Gaines MRN: [id number] Date of Birth: 31-Aug-1970

## 2019-09-26 ENCOUNTER — Other Ambulatory Visit: Payer: Self-pay

## 2019-09-26 ENCOUNTER — Ambulatory Visit: Payer: Medicare Other | Admitting: Physical Therapy

## 2019-09-26 ENCOUNTER — Inpatient Hospital Stay: Payer: Medicare Other | Attending: Nurse Practitioner

## 2019-09-26 VITALS — BP 131/66 | HR 72 | Temp 98.6°F

## 2019-09-26 DIAGNOSIS — Z79899 Other long term (current) drug therapy: Secondary | ICD-10-CM | POA: Diagnosis not present

## 2019-09-26 DIAGNOSIS — D508 Other iron deficiency anemias: Secondary | ICD-10-CM

## 2019-09-26 DIAGNOSIS — E538 Deficiency of other specified B group vitamins: Secondary | ICD-10-CM | POA: Insufficient documentation

## 2019-09-26 MED ORDER — CYANOCOBALAMIN 1000 MCG/ML IJ SOLN
1000.0000 ug | Freq: Once | INTRAMUSCULAR | Status: AC
  Administered 2019-09-26: 1000 ug via INTRAMUSCULAR

## 2019-09-26 MED ORDER — CYANOCOBALAMIN 1000 MCG/ML IJ SOLN
INTRAMUSCULAR | Status: AC
  Filled 2019-09-26: qty 1

## 2019-09-26 NOTE — Patient Instructions (Signed)

## 2019-10-01 ENCOUNTER — Other Ambulatory Visit: Payer: Self-pay

## 2019-10-01 ENCOUNTER — Ambulatory Visit: Payer: Medicare Other | Admitting: Physical Therapy

## 2019-10-01 ENCOUNTER — Telehealth: Payer: Self-pay | Admitting: *Deleted

## 2019-10-01 ENCOUNTER — Encounter: Payer: Self-pay | Admitting: Physical Therapy

## 2019-10-01 DIAGNOSIS — M25552 Pain in left hip: Secondary | ICD-10-CM | POA: Diagnosis not present

## 2019-10-01 DIAGNOSIS — M25652 Stiffness of left hip, not elsewhere classified: Secondary | ICD-10-CM

## 2019-10-01 DIAGNOSIS — M25651 Stiffness of right hip, not elsewhere classified: Secondary | ICD-10-CM | POA: Diagnosis not present

## 2019-10-01 DIAGNOSIS — M6281 Muscle weakness (generalized): Secondary | ICD-10-CM

## 2019-10-01 DIAGNOSIS — W19XXXD Unspecified fall, subsequent encounter: Secondary | ICD-10-CM

## 2019-10-01 DIAGNOSIS — R262 Difficulty in walking, not elsewhere classified: Secondary | ICD-10-CM

## 2019-10-01 NOTE — Therapy (Addendum)
[address] [place], [place], [address] Phone: [phone]   Fax:  [phone]  Physical Therapy Treatment/Discharge  Patient Details  Name: Melissa Gaines MRN: [id number] Date of Birth: 1970-05-28 Referring Provider (PT): Chauncey Reading, MD   Encounter Date: 10/01/2019   PT End of Session - 10/01/19 1154    Visit Number 12    Number of Visits 21    Date for PT Re-Evaluation 09/29/19    Authorization Type [employer] MCR    PT Start Time 8786    PT Stop Time 7672   pt unable to tolerate full treatment today.   PT Time Calculation (min) 35 min           Past Medical History:  Diagnosis Date  . Anemia   . Anxiety   . Asthma   . Chronic pain   . Congenital hip dislocation   . GERD (gastroesophageal reflux disease)   . Iron deficiency anemia 04/14/2018  . Kidney mass 2017  . Migraine   . Osteoporosis   . Pathological dislocation of shoulder joint, bilateral    congential  . PTSD (post-traumatic stress disorder)   . Sleep apnea   . Systemic lupus erythematosus ([place])     Past Surgical History:  Procedure Laterality Date  . CESAREAN SECTION  2005  . CESAREAN SECTION W/BTL  2010  . HIP SURGERY     in childhood for congenital hip dislocation  . [place] RESECTION  2014  . STAPEDECTOMY  11/01/2011   L postauricular stapedectomy with CO2 laser and insertion of 6 x 4.75 mm SMart Piston  . TOTAL HIP ARTHROPLASTY     05/2000 R, 11/2000 L    There were no vitals filed for this visit.   Subjective Assessment - 10/01/19 1153    Subjective pt reports fall 4 days agol when she slipped on water. Pt arrives with significant antalgic gait, reaching for walls and furniture walking into the clinic.    Currently in Pain? Yes    Pain Score 10-Worst pain ever    Pain Location Back    Pain Orientation Right;Lower    Pain Descriptors / Indicators Aching;Sharp    Pain Type Chronic pain     Aggravating Factors  fall    Pain Relieving Factors icy hot, heat pack,                             [facility] Adult PT Treatment/Exercise - 10/01/19 0001      Knee/Hip Exercises: Stretches   Other Knee/Hip Stretches passive knee to chest and IR/ER in supine. Increased pain with knee flexion ROM.       Knee/Hip Exercises: Supine   Other Supine Knee/Hip Exercises unable to lift RLE independently today.       Modalities   Modalities Cryotherapy      Cryotherapy   Number Minutes Cryotherapy 10 Minutes    Cryotherapy Location Lumbar Spine    Type of Cryotherapy Ice pack      Manual Therapy   Manual therapy comments Soft tissue work to right lower back/gluteal in sidelying                     PT Short Term Goals - 08/17/19 1026      PT SHORT TERM GOAL #1   Title pt will be indpendent with short term HEP    Status Achieved  PT SHORT TERM GOAL #2   Title pt will be able to get onto her bike at home and ride, even if not full revolutions    Baseline has tried but requires help from kids and unable to lift leg comfortably    Status Partially Met             PT Long Term Goals - 10/01/19 1227      PT LONG TERM GOAL #1   Title gross LE strength to 4/5    Baseline see flowsheet, Left improved, right 3-/5    Time 10    Period Weeks    Status Partially Met      PT LONG TERM GOAL #2   Title pt will be able to get into/out of bed & car with min-no difficulty    Baseline significant difficulty still with getting RLE into and out of car, improved bed transfers    Time 10    Period Weeks    Status Partially Met      PT LONG TERM GOAL #3   Title pt will be able to ambulate while feeling safe and confident    Baseline much improved per patient, until recent fall    Time 10    Period Weeks    Status Achieved      PT LONG TERM GOAL #4   Title pt will be independent in long term HEP    Baseline independent thus far, will resume as able    Period  Weeks    Status Partially Met                 Plan - 10/01/19 1217    Clinical Impression Statement Pt arrives with 10/10 pain and difficulty walking unassisted after a fall 4 days ago where she slipped in water on her floor and landed on her right lower back. She is tender in right lower lumbar area and feels increased pain when bearing weight on RLE.  She was encouaraged to seek medical attention to rule out acute injury. She declined to seek treatment but does plan to call MD to notify of fall and  to request order for RW to assist with ambulation. Pt only able to tolerate gentle passive right hip rom and gentle soft tissue work. Trial of ice pack at end of session to decrease pain. Discussed POC end date with patient and she agreed that it is appropriate to discharge due to recent fall to allow for recovery. She plans to resume her HEP as she is able.    Examination-Activity Limitations Bathing;Stand;Locomotion Level;Bed Mobility;Bend;Transfers;Caring for Others;Sit;Carry;Sleep;Dressing;Squat;Stairs    PT Treatment/Interventions ADLs/Self Care Home Management;Cryotherapy;Moist Heat;Gait training;Stair training;Functional mobility training;Therapeutic activities;Therapeutic exercise;Balance training;Patient/family education;Neuromuscular re-education;Manual techniques;Taping;Passive range of motion;Dry needling    PT Next Visit Plan discharge to [facility] : seated heelslides, ball squeeze seated, seated hamsting stretch, standing calf stretch, supine hip abduction, bike pendulum, seated march, seated LAQ, added SLR, side hip abduction and green band hamstring curls.           Patient will benefit from skilled therapeutic intervention in order to improve the following deficits and impairments:  Abnormal gait, Improper body mechanics, Pain, Postural dysfunction, Increased muscle spasms, Decreased activity tolerance, Decreased range of motion, Decreased endurance,  Decreased strength, Decreased balance, Difficulty walking, Impaired flexibility  Visit Diagnosis: Pain in left hip  Stiffness of right hip, not elsewhere classified  Stiffness of left hip, not elsewhere classified  Difficulty in walking, not elsewhere classified  Muscle weakness (generalized)     Problem List Patient Active Problem List   Diagnosis Date Noted  . Left hip pain 06/26/2019  . Muscle strain of lower extremity, left, initial encounter 06/26/2019  . Healthcare maintenance 06/26/2019  . Iron deficiency anemia 04/14/2018  . Hematuria, gross 03/29/2018  . Iron deficiency anemia due to chronic blood loss 03/29/2018  . Long term current use of anticoagulant 03/29/2018  . Antiphospholipid antibody syndrome ([place]) 01/25/2018  . Morbid obesity ([place]) 07/26/2017  . Systemic lupus erythematosus ([place]) 07/08/2017  . Hx pulmonary embolism 07/08/2017  . Chronic pain of right knee 05/23/2017  . Renal mass 01/26/2017  . 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/21/2016  . Vitamin D deficiency 12/16/2016  . Anemia   . Migraine     Dorene Ar, [name] 10/01/2019, 12:32 PM  [facility] [address] [place], [place], [address] Phone: [phone]   Fax:  [phone]  Name: Melissa Gaines MRN: [id number] Date of Birth: 07-27-1970  PHYSICAL THERAPY DISCHARGE SUMMARY  Visits from Start of Care: 12  Current functional level related to goals / functional outcomes: See above   Remaining deficits: See above   Education / Equipment: Anatomy of condition,  POC, HEP, exercise form/rationale  Plan: Patient agrees to discharge.  Patient goals were partially met. Patient is being discharged due to a change in medical status.  ?????    Needs to return to MD after recent fall.  Robby Pirani C. Hightower PT, DPT 10/01/19 12:57 PM

## 2019-10-01 NOTE — Telephone Encounter (Signed)
Patient would like a walker sent to [facility].  To PCP, if you are agreeable, please send message back to RN team and me to process.  Christen Bame, CMA

## 2019-10-01 NOTE — Telephone Encounter (Signed)
Patient had another fall recently and is requesting a walker, she was seen by PT recently and has an appt for 7/30. Will discuss with PT before ordering.  [name], MD [place] [facility], PGY-2

## 2019-10-02 NOTE — Telephone Encounter (Signed)
Per PT, a walker is indicated for her antalgic gait following a recent fall. Will order walker, patient to follow up 7/30 with MD appointment.   [name], MD [place] [facility], PGY-2

## 2019-10-02 NOTE — Addendum Note (Signed)
Addended by: Bridget Hartshorn on: 10/02/2019 10:11 AM   Modules accepted: Orders

## 2019-10-03 ENCOUNTER — Ambulatory Visit: Payer: Medicare Other | Admitting: Family Medicine

## 2019-10-03 ENCOUNTER — Ambulatory Visit: Payer: Medicare Other | Admitting: Physical Therapy

## 2019-10-05 ENCOUNTER — Encounter: Payer: Self-pay | Admitting: Family Medicine

## 2019-10-05 ENCOUNTER — Ambulatory Visit (INDEPENDENT_AMBULATORY_CARE_PROVIDER_SITE_OTHER): Payer: Medicare Other | Admitting: Family Medicine

## 2019-10-05 ENCOUNTER — Other Ambulatory Visit: Payer: Self-pay

## 2019-10-05 ENCOUNTER — Telehealth: Payer: Self-pay | Admitting: *Deleted

## 2019-10-05 VITALS — BP 114/82 | HR 90 | Ht <= 58 in | Wt 224.8 lb

## 2019-10-05 DIAGNOSIS — M25551 Pain in right hip: Secondary | ICD-10-CM | POA: Diagnosis not present

## 2019-10-05 DIAGNOSIS — Z0000 Encounter for general adult medical examination without abnormal findings: Secondary | ICD-10-CM | POA: Diagnosis not present

## 2019-10-05 DIAGNOSIS — Z23 Encounter for immunization: Secondary | ICD-10-CM

## 2019-10-05 DIAGNOSIS — M25559 Pain in unspecified hip: Secondary | ICD-10-CM | POA: Diagnosis not present

## 2019-10-05 MED ORDER — KETOROLAC TROMETHAMINE 30 MG/ML IJ SOLN
30.0000 mg | Freq: Once | INTRAMUSCULAR | Status: AC
Start: 2019-10-05 — End: 2019-10-05
  Administered 2019-10-05: 30 mg via INTRAMUSCULAR

## 2019-10-05 MED ORDER — NAPROXEN 500 MG PO TABS
500.0000 mg | ORAL_TABLET | Freq: Two times a day (BID) | ORAL | 0 refills | Status: AC
Start: 2019-10-05 — End: 2019-10-19

## 2019-10-05 MED ORDER — ACETAMINOPHEN ER 650 MG PO TBCR: 1300.0000 mg | EXTENDED_RELEASE_TABLET | Freq: Three times a day (TID) | ORAL | 0 refills | Status: AC | PRN

## 2019-10-05 NOTE — Progress Notes (Signed)
    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  Reports fell last Thurs and landed on right hip/buttock.  Denies any LOC, weakness or dizziness.  Pain is constant and sharp.  Massage and laying down helps a little.   PERTINENT  PMH / PSH:  Congenital bilateral hip dislocation  OBJECTIVE:   BP 114/82   Pulse 90   Ht 4\' 10"  (1.473 m)   Wt (!) 224 lb 12.8 oz (102 kg)   LMP 09/27/2019 (Exact Date)   SpO2 98%   BMI 46.98 kg/m    General: Alert and oriented, no apparent distress  MSK: Upper extremity strength 5/5 bilaterally, Lower extremity strength 5/5 bilaterally Hips: Pain elicited on adduction and abduction of right hip.  Joint stable.  No shortening of limbs.   Lower Back: tenderness to right lower back extending to iliac crest.   ASSESSMENT/PLAN:   Right hip pain Recent fall, history of congenital hip dysplasia.  Exam positive for low back pain and abb/add of hip. Considered acute fracture although has been able to weight bear and was seen by PT 4 days ago.  Likely pain secondary to muscle strain. -Xray hips and Lspine -Toradol 30 mg IMx1 -Naproxen 500 mg BID x14/7 -Tylenol CR 1300 mg BID x14/7 -Continue PT -Rollin walker per PT recommendations ordered. -Follow up with PCP  Healthcare maintenance Discussed risks/benefits with patient of COVID vaccine.  Opted to have initial vaccine today.      Carollee Leitz, MD [place]

## 2019-10-05 NOTE — Progress Notes (Signed)
   Covid-19 Vaccination Clinic  Name:  Melissa Gaines    MRN: [id number] DOB: [birth year]  10/05/2019  [name] was observed post Covid-19 immunization for 15 minutes without incident. She was provided with Vaccine Information Sheet and instruction to access the V-Safe system.   [name] was instructed to call 911 with any severe reactions post vaccine: [name] Kitchen Difficulty breathing  . Swelling of face and throat  . A fast heartbeat  . A bad rash all over body  . Dizziness and weakness

## 2019-10-05 NOTE — Telephone Encounter (Signed)
Received and will process now.  Melissa Gaines, CMA  

## 2019-10-05 NOTE — Telephone Encounter (Signed)
Community message sent to [employer], Skeet Latch and Alyse Low @ [facility] to process DME order for rolling walker.   Christen Bame, CMA

## 2019-10-05 NOTE — Patient Instructions (Signed)
Start your Naproxen tomorrow  I will order a rolling walker for you Continue with physiotherapy  Will call you with xray results  Follow up with PCP as needed  Carollee Leitz, MD [facility]

## 2019-10-07 ENCOUNTER — Encounter: Payer: Self-pay | Admitting: Family Medicine

## 2019-10-07 DIAGNOSIS — M25551 Pain in right hip: Secondary | ICD-10-CM | POA: Insufficient documentation

## 2019-10-07 NOTE — Assessment & Plan Note (Addendum)
Recent fall, history of congenital hip dysplasia.  Exam positive for low back pain and abb/add of hip. Considered acute fracture although has been able to weight bear and was seen by PT 4 days ago.  Likely pain secondary to muscle strain. -Xray hips and Lspine -Toradol 30 mg IMx1 -Naproxen 500 mg BID x14/7 -Tylenol CR 1300 mg BID x14/7 -Continue PT -Rollin walker per PT recommendations ordered. -Follow up with PCP

## 2019-10-07 NOTE — Assessment & Plan Note (Signed)
Discussed risks/benefits with patient of COVID vaccine.  Opted to have initial vaccine today.

## 2019-10-10 ENCOUNTER — Other Ambulatory Visit: Payer: Self-pay

## 2019-10-10 ENCOUNTER — Ambulatory Visit
Admission: RE | Admit: 2019-10-10 | Discharge: 2019-10-10 | Disposition: A | Discharge status: Home / Self Care | Payer: Medicare HMO | Source: Ambulatory Visit | Attending: Family Medicine | Admitting: Family Medicine

## 2019-10-10 DIAGNOSIS — S79911A Unspecified injury of right hip, initial encounter: Secondary | ICD-10-CM | POA: Diagnosis not present

## 2019-10-10 DIAGNOSIS — M25559 Pain in unspecified hip: Secondary | ICD-10-CM

## 2019-10-10 DIAGNOSIS — M2578 Osteophyte, vertebrae: Secondary | ICD-10-CM | POA: Diagnosis not present

## 2019-10-10 DIAGNOSIS — M47816 Spondylosis without myelopathy or radiculopathy, lumbar region: Secondary | ICD-10-CM | POA: Diagnosis not present

## 2019-10-23 IMAGING — MG MM DIGITAL SCREENING BILAT W/ CAD
6 series · 6 of 6 positions shown · non-contrast
Comparison: Previous exam(s).

CLINICAL DATA: Screening.

EXAM:
DIGITAL SCREENING BILATERAL MAMMOGRAM WITH CAD

[R MLO (1 of 2)]
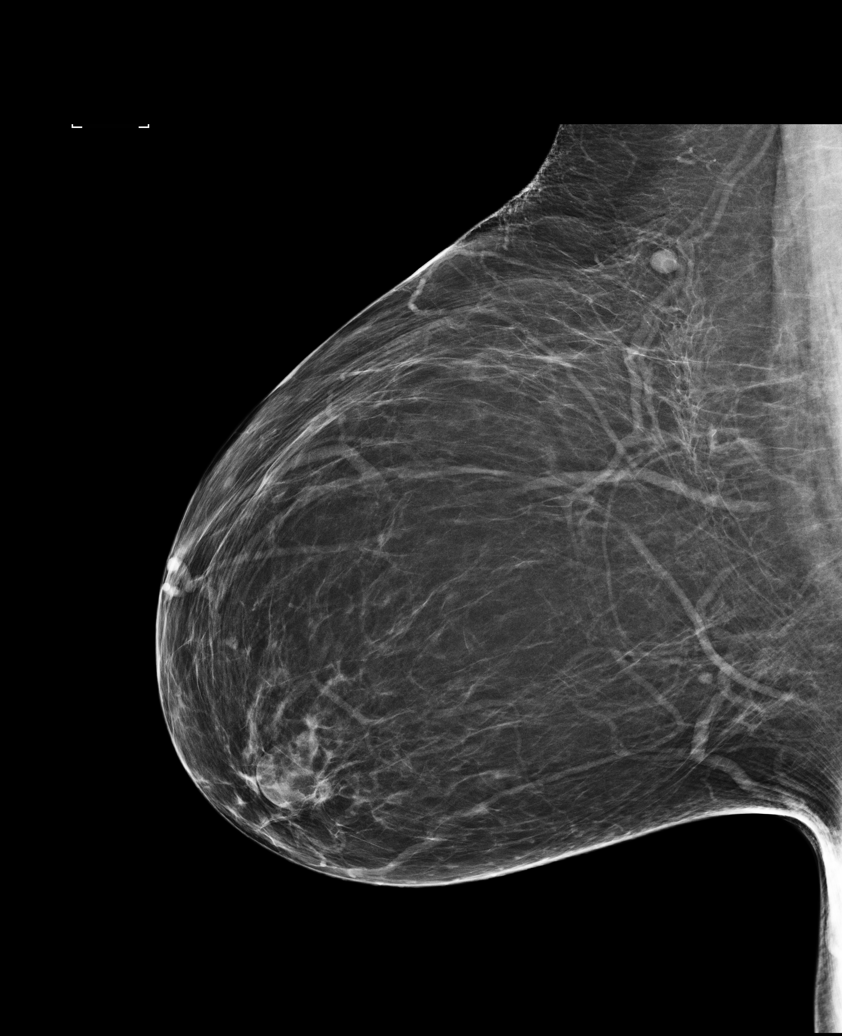

[L CC]
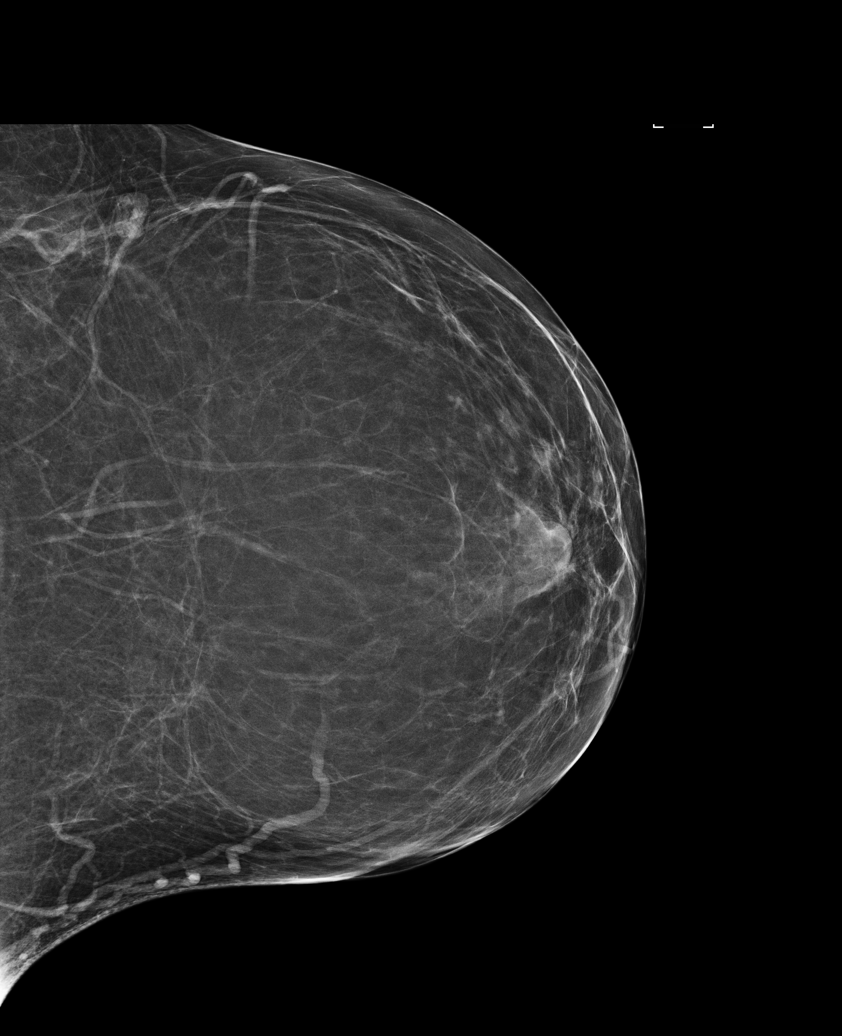

[R CC]
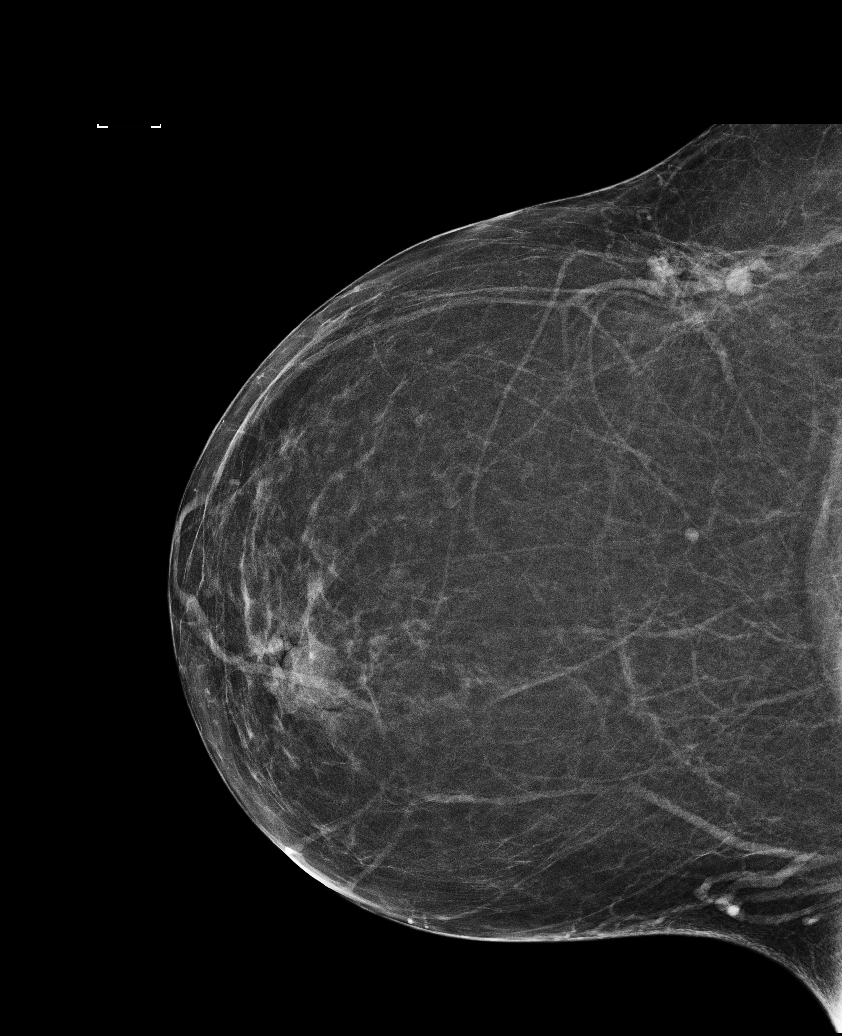

[R MLO (2 of 2)]
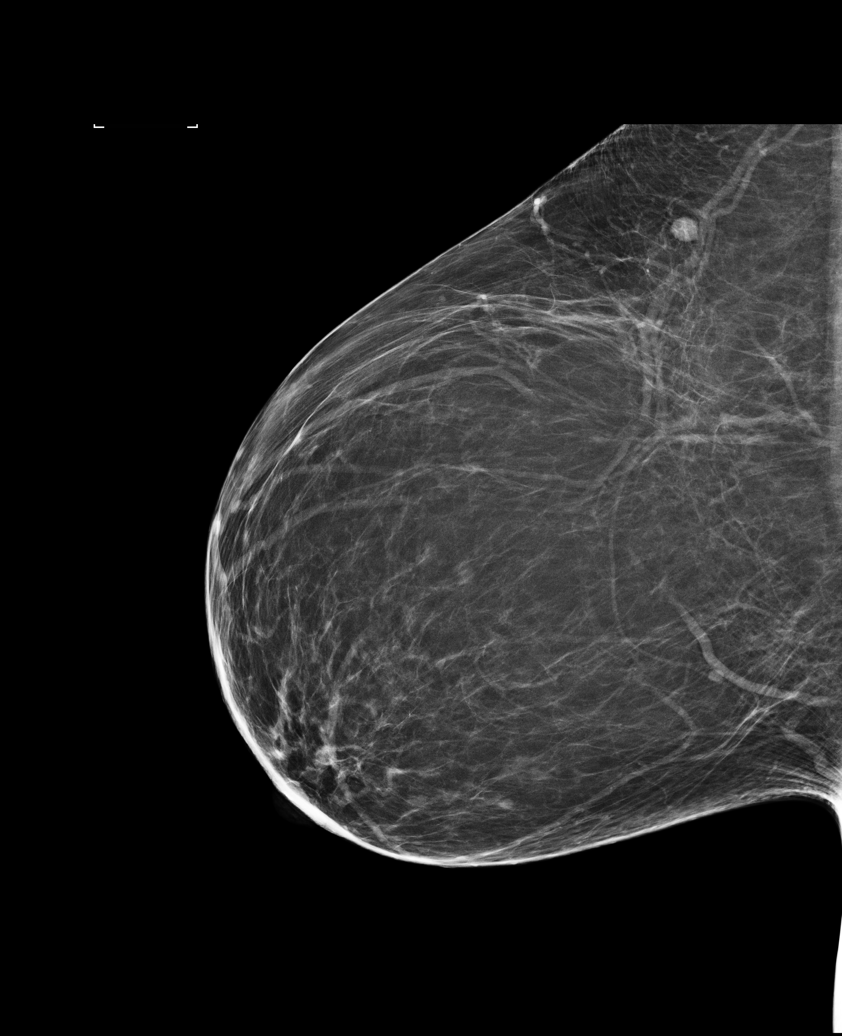

[L MLO (1 of 2)]
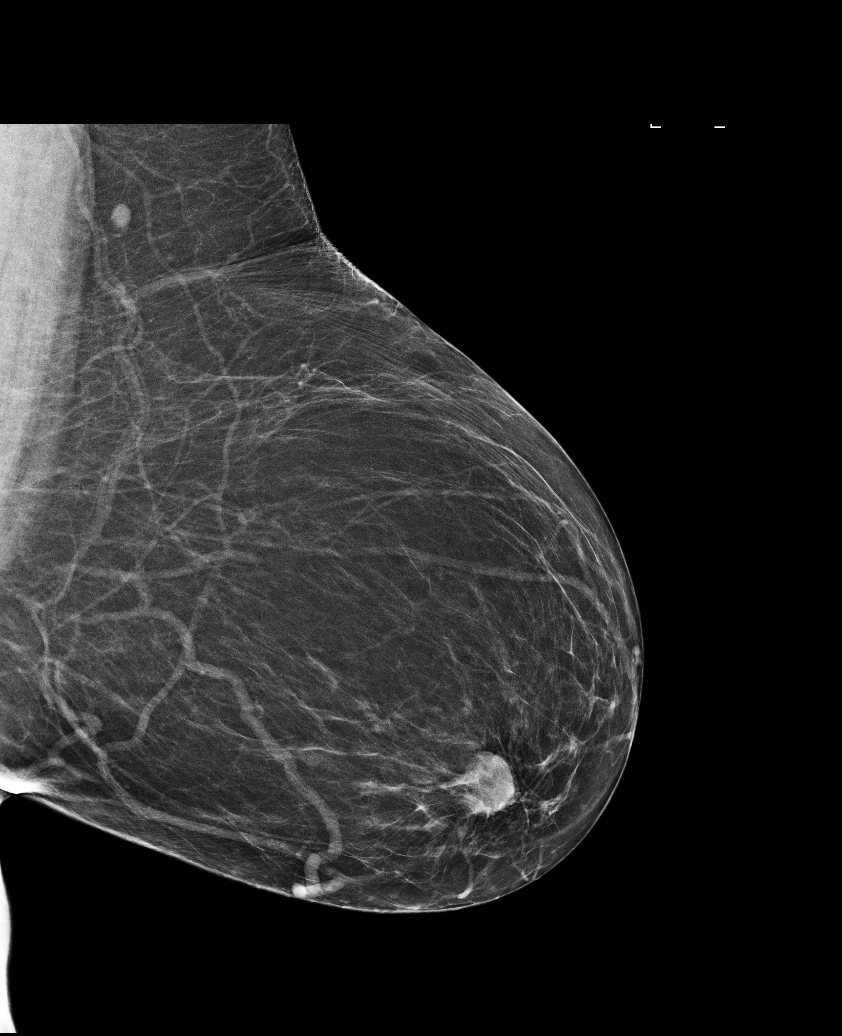

[L MLO (2 of 2)]
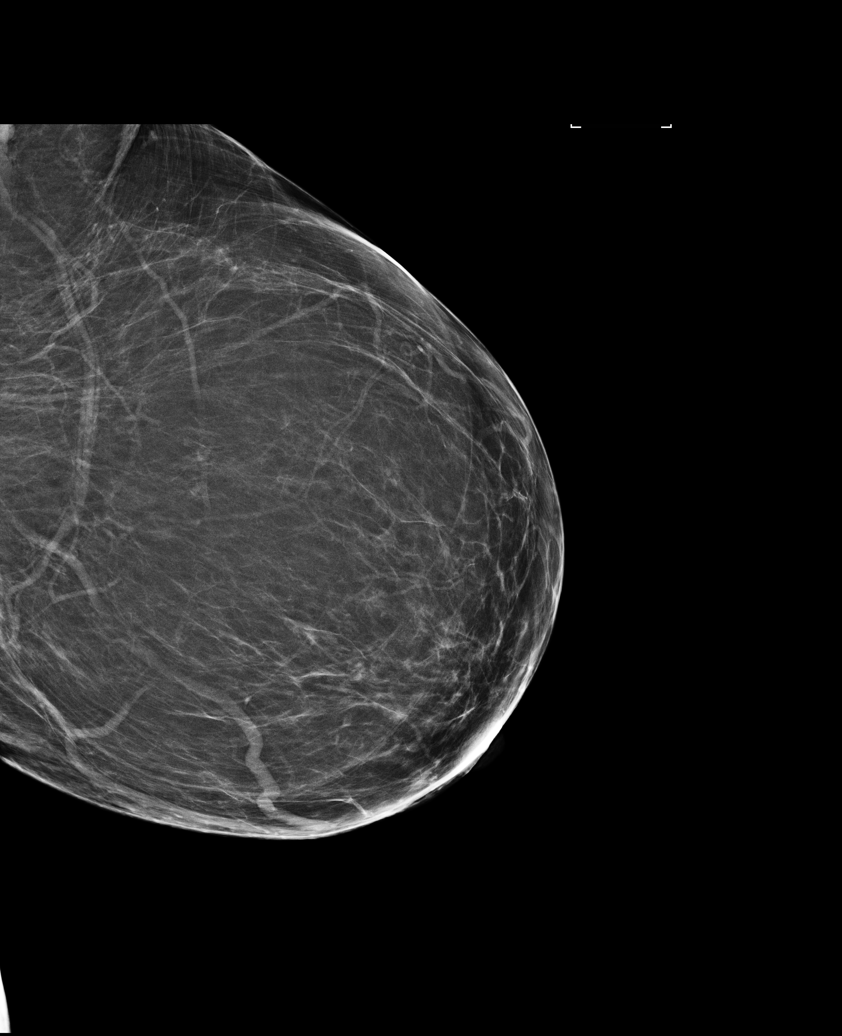

[6 of 6 positions shown; findings below may reference images not displayed]

ACR Breast Density Category b: There are scattered areas of
fibroglandular density.
FINDINGS: There are no findings suspicious for malignancy. Images were
processed with CAD.
IMPRESSION: No mammographic evidence of malignancy. A result letter of this
screening mammogram will be mailed directly to the patient.

RECOMMENDATION:
Screening mammogram in one year. (Code:AS-G-LCT)

BI-RADS CATEGORY  1: Negative.

## 2019-10-24 ENCOUNTER — Telehealth: Payer: Self-pay

## 2019-10-24 ENCOUNTER — Other Ambulatory Visit: Payer: Self-pay | Admitting: Family Medicine

## 2019-10-24 DIAGNOSIS — Z96643 Presence of artificial hip joint, bilateral: Secondary | ICD-10-CM

## 2019-10-24 DIAGNOSIS — M25559 Pain in unspecified hip: Secondary | ICD-10-CM

## 2019-10-24 DIAGNOSIS — J4550 Severe persistent asthma, uncomplicated: Secondary | ICD-10-CM

## 2019-10-24 NOTE — Telephone Encounter (Signed)
Patient has h/o asthma, uses home nebulizer machine which broke, would like new one. Also patient has h/o congenital hip dysplasia and had bilateral hip arthroplasties in 2002. She is morbidly obese, and had a recent slip and fall for which she has been seen by PT and requires walker. She is requesting bedside commode for ease of use while recovering from fall.  [name], MD [place] [facility], PGY-2

## 2019-10-24 NOTE — Telephone Encounter (Signed)
Patient calls nurse line requesting DME orders for a portable toilet and nebulizer machine. Please advise.

## 2019-10-25 ENCOUNTER — Other Ambulatory Visit: Payer: Self-pay | Admitting: Family Medicine

## 2019-10-25 DIAGNOSIS — Z1231 Encounter for screening mammogram for malignant neoplasm of breast: Secondary | ICD-10-CM

## 2019-10-25 NOTE — Telephone Encounter (Signed)
Community message sent to [employer]. 

## 2019-10-26 ENCOUNTER — Other Ambulatory Visit: Payer: Self-pay

## 2019-10-26 ENCOUNTER — Encounter: Payer: Self-pay | Admitting: Hematology

## 2019-10-26 ENCOUNTER — Other Ambulatory Visit: Payer: Self-pay | Admitting: Hematology

## 2019-10-26 ENCOUNTER — Ambulatory Visit: Payer: Medicare Other

## 2019-10-26 ENCOUNTER — Inpatient Hospital Stay (HOSPITAL_BASED_OUTPATIENT_CLINIC_OR_DEPARTMENT_OTHER): Payer: Medicare HMO | Admitting: Hematology

## 2019-10-26 ENCOUNTER — Inpatient Hospital Stay: Payer: Medicare HMO | Attending: Nurse Practitioner

## 2019-10-26 ENCOUNTER — Inpatient Hospital Stay: Payer: Medicare HMO

## 2019-10-26 VITALS — BP 137/93 | HR 86 | Temp 97.8°F | Resp 20 | Ht <= 58 in | Wt 223.0 lb

## 2019-10-26 DIAGNOSIS — M549 Dorsalgia, unspecified: Secondary | ICD-10-CM | POA: Diagnosis not present

## 2019-10-26 DIAGNOSIS — R5383 Other fatigue: Secondary | ICD-10-CM | POA: Insufficient documentation

## 2019-10-26 DIAGNOSIS — E611 Iron deficiency: Secondary | ICD-10-CM | POA: Diagnosis not present

## 2019-10-26 DIAGNOSIS — Z79899 Other long term (current) drug therapy: Secondary | ICD-10-CM | POA: Diagnosis not present

## 2019-10-26 DIAGNOSIS — D508 Other iron deficiency anemias: Secondary | ICD-10-CM

## 2019-10-26 DIAGNOSIS — K909 Intestinal malabsorption, unspecified: Secondary | ICD-10-CM | POA: Diagnosis not present

## 2019-10-26 DIAGNOSIS — M329 Systemic lupus erythematosus, unspecified: Secondary | ICD-10-CM | POA: Insufficient documentation

## 2019-10-26 DIAGNOSIS — N920 Excessive and frequent menstruation with regular cycle: Secondary | ICD-10-CM | POA: Diagnosis not present

## 2019-10-26 DIAGNOSIS — M25559 Pain in unspecified hip: Secondary | ICD-10-CM | POA: Diagnosis not present

## 2019-10-26 DIAGNOSIS — G8929 Other chronic pain: Secondary | ICD-10-CM | POA: Insufficient documentation

## 2019-10-26 DIAGNOSIS — E663 Overweight: Secondary | ICD-10-CM | POA: Insufficient documentation

## 2019-10-26 DIAGNOSIS — D6861 Antiphospholipid syndrome: Secondary | ICD-10-CM | POA: Insufficient documentation

## 2019-10-26 DIAGNOSIS — E538 Deficiency of other specified B group vitamins: Secondary | ICD-10-CM

## 2019-10-26 DIAGNOSIS — Q6589 Other specified congenital deformities of hip: Secondary | ICD-10-CM | POA: Insufficient documentation

## 2019-10-26 DIAGNOSIS — G43909 Migraine, unspecified, not intractable, without status migrainosus: Secondary | ICD-10-CM | POA: Insufficient documentation

## 2019-10-26 DIAGNOSIS — D519 Vitamin B12 deficiency anemia, unspecified: Secondary | ICD-10-CM | POA: Diagnosis not present

## 2019-10-26 DIAGNOSIS — Z9884 Bariatric surgery status: Secondary | ICD-10-CM | POA: Diagnosis not present

## 2019-10-26 DIAGNOSIS — Z7901 Long term (current) use of anticoagulants: Secondary | ICD-10-CM | POA: Insufficient documentation

## 2019-10-26 LAB — CBC WITH DIFFERENTIAL (CANCER CENTER ONLY)
Abs Immature Granulocytes: 0.05 10*3/uL (ref 0.00–0.07)
Basophils Absolute: 0 10*3/uL (ref 0.0–0.1)
Basophils Relative: 1 %
Eosinophils Absolute: 0.3 10*3/uL (ref 0.0–0.5)
Eosinophils Relative: 4 %
HCT: 42.9 % (ref 36.0–46.0)
Hemoglobin: 13.8 g/dL (ref 12.0–15.0)
Immature Granulocytes: 1 %
Lymphocytes Relative: 44 %
Lymphs Abs: 3.3 10*3/uL (ref 0.7–4.0)
MCH: 28.1 pg (ref 26.0–34.0)
MCHC: 32.2 g/dL (ref 30.0–36.0)
MCV: 87.4 fL (ref 80.0–100.0)
Monocytes Absolute: 0.3 10*3/uL (ref 0.1–1.0)
Monocytes Relative: 5 %
Neutro Abs: 3.2 10*3/uL (ref 1.7–7.7)
Neutrophils Relative %: 45 %
Platelet Count: 378 10*3/uL (ref 150–400)
RBC: 4.91 MIL/uL (ref 3.87–5.11)
RDW: 13.5 % (ref 11.5–15.5)
WBC Count: 7.2 10*3/uL (ref 4.0–10.5)
nRBC: 0 % (ref 0.0–0.2)

## 2019-10-26 LAB — CMP (CANCER CENTER ONLY)
ALT: 9 U/L (ref 0–44)
AST: 9 U/L — ABNORMAL LOW (ref 15–41)
Albumin: 3.8 g/dL (ref 3.5–5.0)
Alkaline Phosphatase: 117 U/L (ref 38–126)
Anion gap: 11 (ref 5–15)
BUN: 5 mg/dL — ABNORMAL LOW (ref 6–20)
CO2: 24 mmol/L (ref 22–32)
Calcium: 9.7 mg/dL (ref 8.9–10.3)
Chloride: 104 mmol/L (ref 98–111)
Creatinine: 0.65 mg/dL (ref 0.44–1.00)
GFR, Est AFR Am: >60 mL/min (ref >60)
GFR, Estimated: >60 mL/min (ref >60)
Glucose, Bld: 86 mg/dL (ref 70–99)
Potassium: 4.1 mmol/L (ref 3.5–5.1)
Sodium: 139 mmol/L (ref 135–145)
Total Bilirubin: 0.3 mg/dL (ref 0.3–1.2)
Total Protein: 8.1 g/dL (ref 6.5–8.1)

## 2019-10-26 LAB — IRON AND TIBC
Iron: 43 ug/dL (ref 41–142)
Saturation Ratios: 13 % — ABNORMAL LOW (ref 21–57)
TIBC: 344 ug/dL (ref 236–444)
UIBC: 300 ug/dL (ref 120–384)

## 2019-10-26 LAB — VITAMIN B12: Vitamin B-12: 320 pg/mL (ref 180–914)

## 2019-10-26 LAB — FERRITIN: Ferritin: 98 ng/mL (ref 11–307)

## 2019-10-26 MED ORDER — CYANOCOBALAMIN 1000 MCG/ML IJ SOLN
1000.0000 ug | Freq: Once | INTRAMUSCULAR | Status: AC
  Administered 2019-10-26: 1000 ug via INTRAMUSCULAR

## 2019-10-26 NOTE — Patient Instructions (Signed)

## 2019-10-26 NOTE — Progress Notes (Signed)
[place]   [phone] Fax:([phone]) [phone]   Clinic Follow up Note   Patient Care Team: [name], MD as PCP - Almond Lint, DO as Consulting Physician (Neurology)  Date of Service:  10/26/2019  CHIEF COMPLAINT: F/u iron deficiency and B12 deficiency anemia   DIAGNOSIS LIST: 1. Anemia- on 12/19/2017 ferritin 6 serum iron 19 TIBC 478, 4% transferrin saturationHgb 12.2 consistent with iron deficiency, began oral iron; ferritin on 04/14/18 <4, patient received IV Feraheme weekly x2 on 04/28/18 and 05/05/18   2.B12 on 10/13/27 low 164; pending GI eval. She reports history of B12 deficiency and injections while living in [place], none in last 3 years since relocating to [facility]. H/o gastric bypass surgery in 2014  3. 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 G2P2  4. SLE - presented with butterfly rash in 2005, per historical note she was treated with plaquenil and prednisone but changed to plaquenil and methotrexate due to drug AE's. Positive ANA titer on 11/15/2016; lupus anticoagulant positive on 07/08/17, 07/22/17, and 05/01/18.The antiphospholipid antibody IgG 46.1 (<15); antiphospholipid antibody IgM 13.4 (<15); antiphospholipid antibody IgA 21(<15);beta-2 glycoprotein 1 antibody IgM19.4 (<15); beta-2 glycoprotein antibody IgG G 50.2 (<15).Followed by RheumatologistDr. Jacolyn Reedy [facility]  5. Hypercoagulation work up:On 12/19/2017 showed slightly elevated protein C activity to 181%(range 73-180%)and elevated factor VIII assay to 223%,elevated beta-2 glycoprotein IgG to 27,and elevated cardiolipin antibodies IgG, IgM, and IgA; otherwisehemoglobinopathy evaluation, AntithrombinIII,protein S activity;factor V Leiden, andprothrombin gene were normal   CURRENT THERAPY: 1.IV Feraheme 510 mg 04/28/18, 05/05/18, 11/02/2018 2. Oral ferrous sulfate, takes 1-3  tabs daily  3. Apixaban 2.5 mg BID 4. B12 injection 1000 mcg monthly starting 10/2018    INTERVAL HISTORY:  Melissa Gaines is here for a follow up of IDA and B12 deficiency. She was last seen by our clinic 6 months ago with NP Lacie. She presents to the clinic alone. She notes she is doing fairly well. She notes no new major changes in the past year. She notes she is more fatigued lately.  She denies any new medication, I reviewed her list with her. She is on Eliquis. She notes her periods are sporadic but heavy and will last 1 week. She does not hold Eliquis during period. She notes she fell 3 weeks ago from slipping on slick floor at home and her chronic back pain has been exacerbated. She was given Naprosyn but ran out.     REVIEW OF SYSTEMS:   Constitutional: Denies fevers, chills or abnormal weight loss Eyes: Denies blurriness of vision Ears, nose, mouth, throat, and face: Denies mucositis or sore throat Respiratory: Denies cough, dyspnea or wheezes Cardiovascular: Denies palpitation, chest discomfort or lower extremity swelling Gastrointestinal:  Denies nausea, heartburn or change in bowel habits Skin: Denies abnormal skin rashes MSK: (+) Exacerbated chronic back pain. (+) Chronic hip pain  Lymphatics: Denies new lymphadenopathy or easy bruising Neurological:Denies numbness, tingling or new weaknesses Behavioral/Psych: Mood is stable, no new changes  All other systems were reviewed with the patient and are negative.  MEDICAL HISTORY:  Past Medical History:  Diagnosis Date  . Anemia   . Anxiety   . Asthma   . Chronic pain   . Congenital hip dislocation   . GERD (gastroesophageal reflux disease)   . Iron deficiency anemia 04/14/2018  . Kidney mass 2017  . Migraine   . Osteoporosis   . Pathological dislocation  of shoulder joint, bilateral    congential  . PTSD (post-traumatic stress disorder)   . Sleep apnea   . Systemic lupus erythematosus ([place])      SURGICAL HISTORY: Past Surgical History:  Procedure Laterality Date  . CESAREAN SECTION  2005  . CESAREAN SECTION W/BTL  2010  . HIP SURGERY     in childhood for congenital hip dislocation  . [place] RESECTION  2014  . STAPEDECTOMY  11/01/2011   L postauricular stapedectomy with CO2 laser and insertion of 6 x 4.75 mm SMart Piston  . TOTAL HIP ARTHROPLASTY     05/2000 R, 11/2000 L    I have reviewed the social history and family history with the patient and they are unchanged from previous note.  ALLERGIES:  is allergic to fish allergy.  MEDICATIONS:  Current Outpatient Medications  Medication Sig Dispense Refill  . acetaminophen (TYLENOL) 500 MG tablet Take 500 mg by mouth every 6 (six) hours as needed for headache.    . albuterol (VENTOLIN HFA) 108 (90 Base) MCG/ACT inhaler Inhale 2 puffs into the lungs every 6 (six) hours as needed for wheezing or shortness of breath. 6.7 g 3  . apixaban (ELIQUIS) 5 MG TABS tablet Take 5 mg by mouth 2 (two) times daily.    . cyanocobalamin (,VITAMIN B-12,) 1000 MCG/ML injection Inject 1 mL (1,000 mcg total) into the skin every 30 (thirty) days. 5 mL 0  . Erenumab-aooe (AIMOVIG) 70 MG/ML SOAJ Inject 70 mg into the skin every 30 (thirty) days. 1 pen 11  . ferrous sulfate 325 (65 FE) MG tablet Take 325 mg by mouth daily with breakfast.    . gabapentin (NEURONTIN) 100 MG capsule TAKE 3 CAPSULE EVERY NIGHT AT BEDTIME 90 capsule 3  . hydrOXYzine (VISTARIL) 50 MG capsule Take 1 capsule (50 mg total) by mouth at bedtime. 90 capsule 0  . naproxen (NAPROSYN) 500 MG tablet Take 500 mg by mouth 2 (two) times daily.    [name] Kitchen omeprazole (PRILOSEC) 40 MG capsule Take 40 mg by mouth daily.    [name] Kitchen PARoxetine (PAXIL) 40 MG tablet Take 1 tablet (40 mg total) by mouth daily. 90 tablet 0  . SYRINGE/NEEDLE, DISP, 1 ML 25G X 5/8" 1 ML MISC Match with B12 3 each 3  . Vitamin D, Ergocalciferol, (DRISDOL) 50000 units CAPS capsule Take 1 capsule (50,000  Units total) by mouth every 7 (seven) days. 8 capsule 0   No current facility-administered medications for this visit.    PHYSICAL EXAMINATION: ECOG PERFORMANCE STATUS: 3 - Symptomatic, >50% confined to bed  Vitals:   10/26/19 1023  BP: (!) 137/93  Pulse: 86  Resp: 20  Temp: 97.8 F (36.6 C)  SpO2: 97%   Filed Weights   10/26/19 1023  Weight: 223 lb (101.2 kg)    GENERAL:alert, no distress and comfortable, obese female  SKIN: skin color, texture, turgor are normal, no rashes or significant lesions EYES: normal, Conjunctiva are pink and non-injected, sclera clear NECK: supple, thyroid normal size, non-tender, without nodularity LYMPH:  no palpable lymphadenopathy in the cervical, axillary  LUNGS: clear to auscultation and percussion with normal breathing effort HEART: regular rate & rhythm and no murmurs and no lower extremity edema ABDOMEN:abdomen soft, non-tender and normal bowel sounds Musculoskeletal:no cyanosis of digits and no clubbing  NEURO: alert & oriented x 3 with fluent speech, no focal motor/sensory deficits  LABORATORY DATA:  I have reviewed the data as listed CBC Latest Ref Rng & Units  10/26/2019 07/27/2019 04/30/2019  WBC 4.0 - 10.5 K/uL 7.2 7.2 7.5  Hemoglobin 12.0 - 15.0 g/dL 13.8 14.2 14.1  Hematocrit 36 - 46 % 42.9 43.0 42.8  Platelets 150 - 400 K/uL 378 293 303     CMP Latest Ref Rng & Units 10/26/2019 07/27/2019 04/30/2019  Glucose 70 - 99 mg/dL 86 123(H) 91  BUN 6 - 20 mg/dL 5(L) 8 7  Creatinine 0.44 - 1.00 mg/dL 0.65 0.64 0.61  Sodium 135 - 145 mmol/L 139 139 140  Potassium 3.5 - 5.1 mmol/L 4.1 3.7 3.9  Chloride 98 - 111 mmol/L 104 107 105  CO2 22 - 32 mmol/L 24 23 24   Calcium 8.9 - 10.3 mg/dL 9.7 9.3 8.7(L)  Total Protein 6.5 - 8.1 g/dL 8.1 8.0 7.6  Total Bilirubin 0.3 - 1.2 mg/dL 0.3 0.3 0.2(L)  Alkaline Phos 38 - 126 U/L 117 73 58  AST 15 - 41 U/L 9(L) 12(L) 13(L)  ALT 0 - 44 U/L 9 13 15       RADIOGRAPHIC STUDIES: I have personally  reviewed the radiological images as listed and agreed with the findings in the report. No results found.   ASSESSMENT & PLAN:  Melissa Gaines is a 49 y.o. female with    1.Iron deficiencyanemia due to chronic blood loss from menorrhagia and malabsorption due to gastric bypass surgery -She takes oral iron 1-3 times daily which she tolerates well, has required IV Feraheme infrequently to keep goal ferritin >50  -S/p IV Feraheme x2 in 04/2018. She initially responded well, but ferritin on 10/13/18 down to 16 and she became symptomatic with fatigue. Received IV Feraheme x1 on 11/02/2018 -She will continue IV Feraheme as needed, last on 05/15/19.  -From an Anemia standpoint she is stable. Labs reviewed, Hg normal, Ferritin 98, Iron 43, Sat ratio 13. MMA and B12 still pending. Her anemia is doing well lately.  -She will continue oral iron and IV Feraheme as needed to keep ferritin >50.  -F/u in 6 months  -She has gotten 1/2 COVID19 vaccines and will get 2nd dose on 10/31/19.    2.B12deficiency, secondary to previous gastric bypass surgery -8/7/20B12level decreased to 164with signs of peripheral neuropathy. -She began monthly B12 inj in 10/2018, her level is stable around 220 -continue monthly injections at [facility], her insurance does not cover home administration -B12 is still pending based on level may increase her injections to every 2 or 3 weeks. Will proceed with injection today   3.SLE  - followed by Rheum at [facility]  4.Antiphospholipidsyndrome -continue Eliquis indefinitely, tolerating well without bleeding -Refills per PCP  -Given menorrhagia, I recommend she hold Eliquis for 3 days during her periods.   5. Migraines, Chronic Back Pain, Congenital hip dysplasia  -followed by Neurology  -She has congenital hip dislocation since an infant and has require surgery on both hips. She has chronic pain from this.  -She had a fall 3 weeks ago from a slip and fall and  exacerbated her chronic back pain. She has used walker for the past week. She was on Naprosyn by PCP office, I discussed using Tylenol now instead.  -I recommend her to see orthopedist, I will refer her.   6. Overweight  -I discussed reducing her weight can improve her overall health. She was previously referred to [facility] health weight and wellness clinic but did not go due to costs.  -She does have chronic back and hip issues which can reduce her activity.  -I recommend change  in diet with less carbohydrates, red meat, fats, fried foods, soft drinks or sweets. She should increase vegetable and chicken and [place] and snack in between as indicated.    7. Financial Support  -She has [employer], Medicaid and Medicare.   PLAN: -Send referral to [facility] surgery, I gave her their contact information  -Proceed with B12 injection today  -Continue monthly B12 inj  -IV Feraheme if Ferritin<50 -Continue oral iron  -Lab and f/u in 6 months    No problem-specific Assessment & Plan notes found for this encounter.   Orders Placed This Encounter  Procedures  . AMB referral to orthopedics    Referral Priority:   Routine    Referral Type:   Consultation    Number of Visits Requested:   1   All questions were answered. The patient knows to call the clinic with any problems, questions or concerns. No barriers to learning was detected. The total time spent in the appointment was 25 minutes.     Truitt Merle, MD 10/26/2019   I, Joslyn Devon, am acting as scribe for Truitt Merle, MD.   I have reviewed the above documentation for accuracy and completeness, and I agree with the above.

## 2019-10-29 ENCOUNTER — Telehealth: Payer: Self-pay

## 2019-10-29 ENCOUNTER — Other Ambulatory Visit: Payer: Self-pay

## 2019-10-29 ENCOUNTER — Ambulatory Visit (INDEPENDENT_AMBULATORY_CARE_PROVIDER_SITE_OTHER): Payer: Medicare HMO

## 2019-10-29 DIAGNOSIS — Z23 Encounter for immunization: Secondary | ICD-10-CM

## 2019-10-29 LAB — METHYLMALONIC ACID, SERUM: Methylmalonic Acid, Quantitative: 89 nmol/L (ref 0–378)

## 2019-10-29 NOTE — Telephone Encounter (Signed)
Patient in nurse clinic for second [place] immunization and is inquiring about status of DME orders.   Sent community message to [employer] to begin process for DME nebulizer and BSC. Will await response.   Talbot Grumbling, RN

## 2019-10-29 NOTE — Telephone Encounter (Signed)
-----   Message from Truitt Merle, MD sent at 10/28/2019  6:16 PM EDT ----- Please let pt know her iron level and B12 level were good last week, no need for iv iron for now, continue B12 injections, thanks   Truitt Merle  10/28/2019

## 2019-10-29 NOTE — Progress Notes (Signed)
   Covid-19 Vaccination Clinic  Name:  Melissa Gaines    MRN: [id number] DOB: 1970-05-25  10/29/2019  Patient reports to nurse clinic for second [place] vaccination. Patient denies previous allergic reaction to vaccine and is currently taking blood thinner Eliquis. Patient also reports having mammogram scheduled for 9/23. Advised patient to contact breast center for further instruction.  [name] was observed post Covid-19 immunization for 30 minutes based on pre-vaccination screening without incident. She was provided with Vaccine Information Sheet and instruction to access the V-Safe system.   [name] was instructed to call 911 with any severe reactions post vaccine: [name] Kitchen Difficulty breathing  . Swelling of face and throat  . A fast heartbeat  . A bad rash all over body  . Dizziness and weakness   Patient provided with updated immunization card and record.   Talbot Grumbling, RN

## 2019-10-29 NOTE — Telephone Encounter (Signed)
See below message from [employer].   received, thank you    Talbot Grumbling, RN

## 2019-10-29 NOTE — Telephone Encounter (Signed)
ATTEMPT CALL #1/  Per Dr . Burr Medico  Please let pt know her iron level and B12 level were good last week, no need for iv iron for now, continue B12 injections, thanks

## 2019-10-30 ENCOUNTER — Telehealth: Payer: Self-pay

## 2019-10-30 DIAGNOSIS — M1632 Unilateral osteoarthritis resulting from hip dysplasia, left hip: Secondary | ICD-10-CM | POA: Diagnosis not present

## 2019-10-30 DIAGNOSIS — J45909 Unspecified asthma, uncomplicated: Secondary | ICD-10-CM | POA: Diagnosis not present

## 2019-10-30 NOTE — Telephone Encounter (Signed)
I left vm requesting patient return my call.

## 2019-10-30 NOTE — Telephone Encounter (Signed)
-----   Message from Truitt Merle, MD sent at 10/28/2019  6:16 PM EDT ----- Please let pt know her iron level and B12 level were good last week, no need for iv iron for now, continue B12 injections, thanks   Truitt Merle  10/28/2019

## 2019-11-02 ENCOUNTER — Telehealth: Payer: Self-pay

## 2019-11-02 NOTE — Telephone Encounter (Signed)
Spoke to pt via phone today.  States that she is still needing medication assistance for Eliquis and was not given paperwork for patient assistance in June.  Requested that I mail her the application.  I mailed the application to patient today.

## 2019-11-04 ENCOUNTER — Other Ambulatory Visit (HOSPITAL_COMMUNITY): Payer: Self-pay | Admitting: Psychiatry

## 2019-11-04 DIAGNOSIS — F4310 Post-traumatic stress disorder, unspecified: Secondary | ICD-10-CM

## 2019-11-04 DIAGNOSIS — F331 Major depressive disorder, recurrent, moderate: Secondary | ICD-10-CM

## 2019-11-05 ENCOUNTER — Encounter (HOSPITAL_COMMUNITY): Payer: Self-pay | Admitting: Psychiatry

## 2019-11-05 ENCOUNTER — Telehealth (INDEPENDENT_AMBULATORY_CARE_PROVIDER_SITE_OTHER): Payer: Medicare HMO | Admitting: Psychiatry

## 2019-11-05 ENCOUNTER — Other Ambulatory Visit: Payer: Self-pay

## 2019-11-05 DIAGNOSIS — F4310 Post-traumatic stress disorder, unspecified: Secondary | ICD-10-CM

## 2019-11-05 DIAGNOSIS — R69 Illness, unspecified: Secondary | ICD-10-CM | POA: Diagnosis not present

## 2019-11-05 DIAGNOSIS — F331 Major depressive disorder, recurrent, moderate: Secondary | ICD-10-CM

## 2019-11-05 MED ORDER — PAROXETINE HCL 40 MG PO TABS: 40.0000 mg | ORAL_TABLET | Freq: Every day | ORAL | 0 refills | Status: DC

## 2019-11-05 MED ORDER — HYDROXYZINE PAMOATE 50 MG PO CAPS: 50.0000 mg | ORAL_CAPSULE | Freq: Every day | ORAL | 0 refills | Status: DC
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
## 2019-11-05 NOTE — Progress Notes (Deleted)
NEUROLOGY FOLLOW UP OFFICE NOTE  Shantay Sonn [id number]  HISTORY OF PRESENT ILLNESS: Melissa Gaines is a 49year old woman with PTSD, anxiety, chronic dizziness, migraines, SLE andantiphospholipid antibody syndrome withhistory of PE on chronic anticoagulation whofollows up for migraines.  UPDATE: Started gabapentin in April to help treat neck pain and possible occipital neuralgia.  Intensity:severe Duration: 1 to several hours with sumatriptan Frequency: Almost daily.    Current NSAIDS:None Current analgesics:None Current triptans:sumatriptan 100mg  Current ergotamine:none Current anti-emetic:none Current muscle relaxants:none Current anti-anxiolytic:hydroxyzine Current sleep aide:none Current Antihypertensive medications:none Current Antidepressant medications:Paxil 40mg  Current Anticonvulsant medications:gabapentin 300mg  QHS Current anti-CGRP:Aimovig 70mg ; Ubrelvy 100mg  Current Vitamins/Herbal/Supplements:D, folic acid Current Antihistamines/Decongestants:none Other therapy:Icy-cold Hormone/birth control:none Other medications:Eliquis, methotrexate  Caffeine:No coffee. Pepsi daily. Alcohol:no Smoker:no Diet:4 glasses of water daily. Skips meals Exercise:no Depression:yes; Anxiety:yes Other pain:Joint pain, back pain, lumbar radicular pain Sleep hygiene:varies  HISTORY: Onset:Mild migraines in1990sbut worse since birth of son in 2010 Location:Left temple radiating down to back of head Quality:pounding, sometimes paroxysmal electric shocks InitialIntensity:Severe.  Aura:no Premonitory Phase:no Postdrome:no Associated symptoms:Photophobia, phonophobia, dizziness,blurred vision,stuttering speech.If severe, she has memory deficits such as difficulty spelling her name or remembering her social security number. Shedenies associated nausea, vomiting,  unilateral numbness or weakness. InitialDuration:Varies. 1 hour to several hours. Last month, she had one that lasted 5 days. InitialFrequency:Daily InitialFrequency of abortive medication:ASA and Tylenol daily Triggers: Unknown Relieving factors: Restingin dark quiet room, cold packs Activity:aggravates  Past NSAIDS:ibuprofen, ASA 325mg  Past analgesics:Extra-strength Tylenol Past abortive triptans:rizatriptan Past abortive ergotamine:none Past muscle relaxants:none Past anti-emetic:none Past antihypertensive medications:none Past antidepressant medications:none Past anticonvulsant medications:topiramate 100mg  Past anti-CGRP:none Past vitamins/Herbal/Supplements:none Past antihistamines/decongestants:none Other past therapies:none  Family history of headache:Mom  Of note, she was told she had a 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 However, she followed up with another neurologist in [place] who performed a repeat brain MRI and was told that it was gone.  PAST MEDICAL HISTORY: Past Medical History:  Diagnosis Date  . Anemia   . Anxiety   . Asthma   . Chronic pain   . Congenital hip dislocation   . GERD (gastroesophageal reflux disease)   . Iron deficiency anemia 04/14/2018  . Kidney mass 2017  . Migraine   . Osteoporosis   . Pathological dislocation of shoulder joint, bilateral    congential  . PTSD (post-traumatic stress disorder)   . Sleep apnea   . Systemic lupus erythematosus ([place])     MEDICATIONS: Current Outpatient Medications on File Prior to Visit  Medication Sig Dispense Refill  . acetaminophen (TYLENOL) 500 MG tablet Take 500 mg by mouth every 6 (six) hours as needed for headache.    . albuterol (VENTOLIN HFA) 108 (90 Base) MCG/ACT inhaler Inhale 2 puffs into the lungs every 6 (six) hours as needed for wheezing or shortness of breath. 6.7 g 3  . apixaban (ELIQUIS) 5 MG TABS tablet Take 5 mg  by mouth 2 (two) times daily.    . cyanocobalamin (,VITAMIN B-12,) 1000 MCG/ML injection Inject 1 mL (1,000 mcg total) into the skin every 30 (thirty) days. 5 mL 0  . Erenumab-aooe (AIMOVIG) 70 MG/ML SOAJ Inject 70 mg into the skin every 30 (thirty) days. 1 pen 11  . ferrous sulfate 325 (65 FE) MG tablet Take 325 mg by mouth daily with breakfast.    . gabapentin (NEURONTIN) 100 MG capsule TAKE 3 CAPSULE EVERY NIGHT AT BEDTIME 90 capsule 3  . hydrOXYzine (VISTARIL) 50 MG  capsule Take 1 capsule (50 mg total) by mouth at bedtime. 90 capsule 0  . naproxen (NAPROSYN) 500 MG tablet Take 500 mg by mouth 2 (two) times daily.    [name] Kitchen omeprazole (PRILOSEC) 40 MG capsule Take 40 mg by mouth daily.    [name] Kitchen PARoxetine (PAXIL) 40 MG tablet Take 1 tablet (40 mg total) by mouth daily. 90 tablet 0  . SYRINGE/NEEDLE, DISP, 1 ML 25G X 5/8" 1 ML MISC Match with B12 3 each 3  . Vitamin D, Ergocalciferol, (DRISDOL) 50000 units CAPS capsule Take 1 capsule (50,000 Units total) by mouth every 7 (seven) days. 8 capsule 0   No current facility-administered medications on file prior to visit.    ALLERGIES: Allergies  Allergen Reactions  . 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 Throat closes and short of breath.    FAMILY HISTORY: Family History  Problem Relation Age of Onset  . Asthma Mother   . Liver cancer Mother   . Heart Problems Mother        heart attack and heart failure  . Kidney Stones Mother   . Osteoporosis Mother   . Stroke Mother   . Depression Mother   . Anxiety disorder Mother   . Kidney Stones Father   . Hernia Father   . Kidney disease Father   . Depression Sister   . Anxiety disorder Sister   . Asthma Sister   . Cancer Maternal Grandmother   . Cancer Paternal Grandmother   . Cancer Other     SOCIAL HISTORY: Social History   Socioeconomic History  . Marital status: Single    Spouse name: Not on file  . Number of children: 2  . Years of education: some high school  .  Highest education level: 11th grade  Occupational History  . Occupation: Disabled   Tobacco Use  . Smoking status: Never Smoker  . Smokeless tobacco: Never Used  Vaping Use  . Vaping Use: Never used  Substance and Sexual Activity  . Alcohol use: Not Currently    Comment: when she was 49 years old; none after having children   . Drug use: No  . Sexual activity: Not Currently    Partners: Male    Birth control/protection: Condom, Surgical  Other Topics Concern  . Not on file  Social History Narrative   Lives with her 2 children and dog here in [place].   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    She likes to spend time with her children and write poems.   Patient lives in one story home, Right handed    Social Determinants of Health   Financial Resource Strain:   . Difficulty of Paying Living Expenses: Not on file  Food Insecurity:   . Worried About Charity fundraiser in the Last Year: Not on file  . Ran Out of Food in the Last Year: Not on file  Transportation Needs:   . Lack of Transportation (Medical): Not on file  . Lack of Transportation (Non-Medical): Not on file  Physical Activity:   . Days of Exercise per Week: Not on file  . Minutes of Exercise per Session: Not on file  Stress:   . Feeling of Stress : Not on file  Social Connections:   . Frequency of Communication with Friends and Family: Not on file  . Frequency of Social Gatherings with Friends and Family: Not on file  . Attends Religious  Services: Not on file  . Active Member of Clubs or Organizations: Not on file  . Attends Archivist Meetings: Not on file  . Marital Status: Not on file  Intimate Partner Violence:   . Fear of Current or Ex-Partner: Not on file  . Emotionally Abused: Not on file  . Physically Abused: Not on file  . Sexually Abused: Not on file    PHYSICAL EXAM: *** General: No acute distress.  Patient appears well-groomed.   Head:   Normocephalic/atraumatic Eyes:  Fundi examined but not visualized Neck: supple, no paraspinal tenderness, full range of motion Heart:  Regular rate and rhythm Lungs:  Clear to auscultation bilaterally Back: No paraspinal tenderness Neurological Exam: alert and oriented to person, place, and time. Attention span and concentration intact, recent and remote memory intact, fund of knowledge intact.  Speech fluent and not dysarthric, language intact.  CN II-XII intact. Bulk and tone normal, muscle strength 5/5 throughout.  Sensation to light touch, temperature and vibration intact.  Deep tendon reflexes 2+ throughout, toes downgoing.  Finger to nose and heel to shin testing intact.  Gait normal, Romberg negative.  IMPRESSION: 1.  Migraine without aura, without status migrainosus, not intractable 2.  Left sided occipital neuralgia/upper cervical radiculopathy  PLAN: ***  Metta Clines, DO  CC: [name], MD

## 2019-11-06 ENCOUNTER — Telehealth: Payer: Self-pay

## 2019-11-06 ENCOUNTER — Ambulatory Visit: Payer: Medicare Other | Admitting: Neurology

## 2019-11-06 NOTE — Telephone Encounter (Signed)
Called and left voicemail for patient regarding lab results. Instructed patient to call [phone] and ask to speak to Dr. Ernestina Penna nurse.

## 2019-11-06 NOTE — Telephone Encounter (Signed)
-----   Message from Truitt Merle, MD sent at 10/28/2019  6:16 PM EDT ----- Please let pt know her iron level and B12 level were good last week, no need for iv iron for now, continue B12 injections, thanks   Truitt Merle  10/28/2019

## 2019-11-08 ENCOUNTER — Telehealth: Payer: Self-pay

## 2019-11-08 ENCOUNTER — Telehealth (HOSPITAL_COMMUNITY): Payer: Medicare Other | Admitting: Psychiatry

## 2019-11-08 NOTE — Telephone Encounter (Signed)
Patient calls nurse line requesting assistance in receiving home health personal care services. Patient reports needing assistance with ADLs.   To PCP  Talbot Grumbling, RN

## 2019-11-09 ENCOUNTER — Ambulatory Visit (INDEPENDENT_AMBULATORY_CARE_PROVIDER_SITE_OTHER): Payer: Medicare HMO

## 2019-11-09 ENCOUNTER — Ambulatory Visit (INDEPENDENT_AMBULATORY_CARE_PROVIDER_SITE_OTHER): Payer: Medicare HMO | Admitting: Family Medicine

## 2019-11-09 ENCOUNTER — Encounter: Payer: Self-pay | Admitting: Family Medicine

## 2019-11-09 ENCOUNTER — Other Ambulatory Visit: Payer: Self-pay

## 2019-11-09 DIAGNOSIS — M25551 Pain in right hip: Secondary | ICD-10-CM

## 2019-11-09 DIAGNOSIS — M545 Low back pain, unspecified: Secondary | ICD-10-CM

## 2019-11-09 NOTE — Progress Notes (Signed)
Office Visit Note   Patient: Melissa Gaines           Date of Birth: Jun 20, 1970           MRN: [id number] Visit Date: 11/09/2019 Requested by: Truitt Merle, MD [address],  [place] [address] PCP: [name], MD  Subjective: Chief Complaint  Patient presents with  . Lower Back - Pain  . Right Hip - Pain    Fell 4 weeks ago. Cannot stand or walk to long. Back to using a rolling walker. Has had surgeries on hips/right leg - congenital hip dysplasia. Since the fall, she has gone backward, in that she cannot walk without aid.    HPI: She is here with right-sided low back and hip pain.  A little more than a month ago she states that she fell, she slipped in her kitchen and fell backward landing on her posterior right hip.  She has had difficulty bearing weight since then, with pain on the posterior thigh, the medial thigh, and the right lower back/posterior hip.  She had x-rays done August 4 which were read as negative for fracture.  She has tried physical therapy but her pain does not seem to be improving.  She cannot walk without her walker, cannot bear full weight on her right leg.  She is status post [place]  This was done for congenital hip dysplasia.  She was doing okay prior to falling.  Denies any radicular symptoms.              ROS: No bowel or bladder dysfunction.  All other systems were reviewed and are negative.  Objective: Vital Signs: There were no vitals taken for this visit.  Physical Exam:  General:  Alert and oriented, in no acute distress. Pulm:  Breathing unlabored. Psy:  Normal mood, congruent affect.  Right leg: She has some tenderness in the lumbar area, L5-S1 region in the midline and near the right SI joint.  She is also tender in the right sciatic notch.  She has pain with hip adduction against resistance and abduction against resistance, as well as with hip flexion.  Ankle  dorsiflexion, eversion and plantar flexion strength are normal.  Imaging: XR HIP UNILAT W OR W/O PELVIS 2-3 VIEWS RIGHT  Result Date: 11/09/2019 X-rays of the right hip reveal lucency around the medial portion of the acetabular component with similar appearance to August for x-rays.  I believe she has a fracture through the ileum which is resulting in the lucency, and it appears to have some callus formation today.  Dr. Erlinda Hong reviewed the films as well.  X-rays from August 4 were also reviewed today.  In my interpretation, on the oblique lumbar view there is a fracture through the pelvis with a couple millimeters displacement resulting in lucency around the medial portion of the acetabular component.    Assessment & Plan: 1.  Right low back and hip pain, suspect due to pelvic fracture.  It appears to be stable. -She can continue weightbearing using her walker.  She will take vitamin D3, vitamin K2 and magnesium for bone health.  Return in 4 weeks for repeat right hip x-rays.     Procedures: No procedures performed  No notes on file     PMFS History: Patient Active Problem List   Diagnosis Date Noted  . Right hip pain 10/07/2019  . Left hip pain 06/26/2019  . Muscle strain of  lower extremity, left, initial encounter 06/26/2019  . Healthcare maintenance 06/26/2019  . Iron deficiency anemia 04/14/2018  . Hematuria, gross 03/29/2018  . Iron deficiency anemia due to chronic blood loss 03/29/2018  . Long term current use of anticoagulant 03/29/2018  . Antiphospholipid antibody syndrome ([place]) 01/25/2018  . Morbid obesity ([place]) 07/26/2017  . Systemic lupus erythematosus ([place]) 07/08/2017  . Hx pulmonary embolism 07/08/2017  . Chronic pain of right knee 05/23/2017  . Renal mass 01/26/2017  . 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/21/2016  . Vitamin D deficiency 12/16/2016  . Anemia   . Migraine    Past Medical History:  Diagnosis Date  . Anemia   . Anxiety   . Asthma   . Chronic pain   .  Congenital hip dislocation   . GERD (gastroesophageal reflux disease)   . Iron deficiency anemia 04/14/2018  . Kidney mass 2017  . Migraine   . Osteoporosis   . Pathological dislocation of shoulder joint, bilateral    congential  . PTSD (post-traumatic stress disorder)   . Sleep apnea   . Systemic lupus erythematosus ([facility])     Family History  Problem Relation Age of Onset  . Asthma Mother   . Liver cancer Mother   . Heart Problems Mother        heart attack and heart failure  . Kidney Stones Mother   . Osteoporosis Mother   . Stroke Mother   . Depression Mother   . Anxiety disorder Mother   . Kidney Stones Father   . Hernia Father   . Kidney disease Father   . Depression Sister   . Anxiety disorder Sister   . Asthma Sister   . Cancer Maternal Grandmother   . Cancer Paternal Grandmother   . Cancer Other     Past Surgical History:  Procedure Laterality Date  . CESAREAN SECTION  2005  . CESAREAN SECTION W/BTL  2010  . HIP SURGERY     in childhood for congenital hip dislocation  . [place] RESECTION  2014  . STAPEDECTOMY  11/01/2011   L postauricular stapedectomy with CO2 laser and insertion of 6 x 4.75 mm SMart Piston  . TOTAL HIP ARTHROPLASTY     05/2000 R, 11/2000 L   Social History   Occupational History  . Occupation: Disabled   Tobacco Use  . Smoking status: Never Smoker  . Smokeless tobacco: Never Used  Vaping Use  . Vaping Use: Never used  Substance and Sexual Activity  . Alcohol use: Not Currently    Comment: when she was 49 years old; none after having children   . Drug use: No  . Sexual activity: Not Currently    Partners: Male    Birth control/protection: Condom, Surgical

## 2019-11-09 NOTE — Patient Instructions (Signed)
    Vitamin D3:  5,000 IU daily  Vitamin K2:  100 mcg daily  Magnesium:  200-400 mg daily    

## 2019-11-14 ENCOUNTER — Telehealth: Payer: Self-pay

## 2019-11-14 DIAGNOSIS — W19XXXD Unspecified fall, subsequent encounter: Secondary | ICD-10-CM

## 2019-11-14 NOTE — Telephone Encounter (Signed)
Patient calls nurse line requesting a referral for a home health aide. Patient reports with her children back in school they are no longer able to help her with ADLs. Patient reports it is hard to dress and bathe herself (help in and out of bath.) Please advise.

## 2019-11-14 NOTE — Telephone Encounter (Signed)
Patient with pelvic fracture after fall requiring help with ADLs now that her children are back in school. Have placed CCM consult for personal care assistance. Patient has appt on 10/4.  [name], MD [facility], PGY-2

## 2019-11-15 ENCOUNTER — Telehealth: Payer: Self-pay | Admitting: *Deleted

## 2019-11-15 NOTE — Chronic Care Management (AMB) (Signed)
  Chronic Care Management   Note  11/15/2019 Name: Melissa Gaines MRN: [id number] DOB: 1970/11/27  [name] is a 49 y.o. year old female who is a primary care patient of [name], MD. I reached out to [name] by phone today in response to a referral sent by [name]'s PCP, [name], MD.     [name] was given information about Chronic Care Management services today including:  1. CCM service includes personalized support from designated clinical staff supervised by her physician, including individualized plan of care and coordination with other care providers 2. 24/7 contact phone numbers for assistance for urgent and routine care needs. 3. Service will only be billed when office clinical staff spend 20 minutes or more in a month to coordinate care. 4. Only one practitioner may furnish and bill the service in a calendar month. 5. The patient may stop CCM services at any time (effective at the end of the month) by phone call to the office staff. 6. The patient will be responsible for cost sharing (co-pay) of up to 20% of the service fee (after annual deductible is met).  Patient agreed to services and verbal consent obtained.   Follow up plan: Telephone appointment with care management team member scheduled for: 11/20/2019  [facility]

## 2019-11-20 ENCOUNTER — Ambulatory Visit: Payer: Medicare HMO | Admitting: Licensed Clinical Social Worker

## 2019-11-20 DIAGNOSIS — Z741 Need for assistance with personal care: Secondary | ICD-10-CM

## 2019-11-20 DIAGNOSIS — Z139 Encounter for screening, unspecified: Secondary | ICD-10-CM

## 2019-11-20 NOTE — Chronic Care Management (AMB) (Signed)
Care Management   Clinical Social Work initial Note  11/20/2019 Name: Melissa Gaines MRN: [id number] DOB: 1971/02/21 Melissa Gaines is a 49 y.o. year old female who sees Melissa Damme, MD for primary care. The Care Management team was consulted by provider to assist the patient with Level of Care Concerns for personal care services.  LCSW reached out to Melissa Gaines today by phone to introduce self, assess needs and barriers to care.   Assessment: Patient is pleasant and engaged in conversation. Patient lives with her sister,  experiencing difficulty with meeting activities of daily living.  See care plan below Recommendation: Patient may benefit from, and is in agreement to personal care services referral Plan: LCSW will F/U with patient in 2 weeks  Review of patient status, including review of consultants reports, relevant laboratory and other test results, and collaboration with appropriate care team members and the patient's provider was performed as part of comprehensive patient evaluation and provision of chronic care management services.    Advance Directive Status: N See Care Plan and [facility] application for related entries.   Not addressed in this encounter SDOH (Social Determinants of Health) assessments performed: Yes:  SDOH Interventions     Most Recent Value  SDOH Interventions  SDOH Interventions for the Following Domains Transportation  Transportation Interventions SCAT Education administrator Area Transporation)      ; Goals Addressed            This Visit's Progress   . personal care [name] (see longitudinal plan of care for additional care plan information)  Current Barriers:   . Patient needs Support, Education, and Care Coordination to resolve unmet Personal Care needs . Patient needs PCP to complete PCS Referral Clinical Social Work Goal(s):  Melissa Gaines Kitchen Over the next 30 to 45 days, patient will  have personal care needs met as evident by having PCS Aide in the home assisting with needs.  Interventions provided by LCSW : . Assessed needs, level of care concerns, basic eligibility and provided education on Personal Care Service process,  . Provided list of PCS providers via e-mail . Collaborate with primary care provider ref completing PCS referral ( Referral placed in PCP's mailbox 11/20/2019 ) . PCS referral will be faxed to Melissa Gaines at [phone] once completed and signed by PCP . LCSW will collaborate with Melissa Gaines to verify application is received and processed.  . Discussed other personal care options with patient (CAP program)  provided information for patient to call. Patient Self Care Activities & Deficits:  . Patient is unable to perform ADLs independently without assistance  . Family/support system will assist patient with meeting needs until [facility] is approved . Return calls from [facility] to set up initial PCS assessment once application is approved . Call [facility] with questions [phone] or [phone]  Initial goal documentation    . Transportation       CARE PLAN ENTRY (see longitudinal plan of care for additional care plan information)  Current Barriers:  . Patient with SCAT for transportation needs  . Patient acknowledges deficits and needs support, education and care coordination in order to meet this unmet need  . Patient currently using Medicaid and Medicare Transportation but this is not meeting all transportation needs Clinical Goal(s)  . Over the next 30 days, patient will complete application for SCAT Interventions provided by LCSW:  . Assessment of needs  and barriers to care as well as how impacting    . Provided patient with information about SCAT as well as copies of application to complete . Reviewed SCAT process with patient  Patient Self Care Activities & Deficits:  . Patient is unable to  independently navigate community resource options without care coordination support  . Patient is motivated to resolve concern  . Patient is able to contact SCAT with questions and complete application as discussed today Initial goal documentation      Outpatient Encounter Medications as of 11/20/2019  Medication Sig  . acetaminophen (TYLENOL) 500 MG tablet Take 500 mg by mouth every 6 (six) hours as needed for headache.  . albuterol (VENTOLIN HFA) 108 (90 Base) MCG/ACT inhaler Inhale 2 puffs into the lungs every 6 (six) hours as needed for wheezing or shortness of breath.  Melissa Gaines Kitchen apixaban (ELIQUIS) 5 MG TABS tablet Take 5 mg by mouth 2 (two) times daily.  . cyanocobalamin (,VITAMIN B-12,) 1000 MCG/ML injection Inject 1 mL (1,000 mcg total) into the skin every 30 (thirty) days.  Melissa Gaines (AIMOVIG) 70 MG/ML SOAJ Inject 70 mg into the skin every 30 (thirty) days.  . ferrous sulfate 325 (65 FE) MG tablet Take 325 mg by mouth daily with breakfast.  . gabapentin (NEURONTIN) 100 MG capsule TAKE 3 CAPSULE EVERY NIGHT AT BEDTIME  . hydrOXYzine (VISTARIL) 50 MG capsule Take 1 capsule (50 mg total) by mouth at bedtime.  . naproxen (NAPROSYN) 500 MG tablet Take 500 mg by mouth 2 (two) times daily.  Melissa Gaines Kitchen omeprazole (PRILOSEC) 40 MG capsule Take 40 mg by mouth daily.  Melissa Gaines Kitchen PARoxetine (PAXIL) 40 MG tablet Take 1 tablet (40 mg total) by mouth daily.  . SYRINGE/NEEDLE, DISP, 1 ML 25G X 5/8" 1 ML MISC Match with B12  . Vitamin D, Ergocalciferol, (DRISDOL) 50000 units CAPS capsule Take 1 capsule (50,000 Units total) by mouth every 7 (seven) days.   No facility-administered encounter medications on file as of 11/20/2019.    Melissa Gaines, Melissa Gaines   [phone] 1:16 PM

## 2019-11-21 DIAGNOSIS — H25813 Combined forms of age-related cataract, bilateral: Secondary | ICD-10-CM | POA: Diagnosis not present

## 2019-11-21 DIAGNOSIS — H16223 Keratoconjunctivitis sicca, not specified as Sjogren's, bilateral: Secondary | ICD-10-CM | POA: Diagnosis not present

## 2019-11-22 ENCOUNTER — Ambulatory Visit: Payer: Self-pay | Admitting: Licensed Clinical Social Worker

## 2019-11-22 DIAGNOSIS — Z741 Need for assistance with personal care: Secondary | ICD-10-CM

## 2019-11-22 NOTE — Chronic Care Management (AMB) (Signed)
   Social Work  Care Management Collaboration 11/22/2019 Name: Melissa Gaines MRN: [id number] DOB: 10-Oct-1970 Melissa Gaines is a 49 y.o. year old female who sees [name], MD for primary care. LCSW was consulted by PCP to assistance patient with  Level of Care Concerns for personal care services.  Intervention: Patient was not interviewed or contacted during this encounter.  LCSW collaborated with PCP and [facility] Plan: LCSW will F/U with [facility] in 3 to 5 days  Review of patient status, including review of consultants reports, relevant laboratory and other test results, and collaboration with appropriate care team members and the patient's provider was performed as part of comprehensive patient evaluation and provision of chronic care management services.    Goals Addressed            This Visit's Progress   . personal care Serivce   On track    [address] (see longitudinal plan of care for additional care plan information)  Current Barriers:   . Patient needs Support, Education, and Care Coordination to resolve unmet Personal Care needs . PCS Referral completed and [facility] Clinical Social Work Goal(s):  [name] Kitchen Over the next 30 to 45 days, patient will have personal care needs met as evident by having PCS Aide in the home assisting with needs.  Interventions provided by LCSW : . Assessed needs, level of care concerns, basic eligibility and provided education on Personal Care Service process,  . Provided list of PCS providers via e-mail . Collaborate with primary care provider ref completing PCS referral  . PCS referral faxed to [facility] at [phone] 11/22/2019  . LCSW will collaborate with [facility] to verify application is received and processed.  . Discussed other personal care options with patient (CAP program)  provided information for patient to call. Patient  Self Care Activities & Deficits:  . Patient is unable to perform ADLs independently without assistance  . Family/support system will assist patient with meeting needs until [facility] is approved . Return calls from [facility] to set up initial PCS assessment once application is approved . Call [facility] with questions [phone] or [phone]  Please see past updates related to this goal by clicking on the "Past Updates" button in the selected goal     Casimer Lanius, [place] / [place]   [phone] 8:40 AM

## 2019-11-29 ENCOUNTER — Other Ambulatory Visit: Payer: Self-pay | Admitting: Neurology

## 2019-11-29 ENCOUNTER — Ambulatory Visit: Payer: Medicare HMO

## 2019-11-29 NOTE — Telephone Encounter (Signed)
Printed off given to [facility] for review.

## 2019-11-30 ENCOUNTER — Ambulatory Visit: Payer: Self-pay | Admitting: Licensed Clinical Social Worker

## 2019-11-30 ENCOUNTER — Telehealth: Payer: Self-pay | Admitting: Family Medicine

## 2019-11-30 ENCOUNTER — Other Ambulatory Visit (HOSPITAL_COMMUNITY): Payer: Self-pay | Admitting: *Deleted

## 2019-11-30 ENCOUNTER — Other Ambulatory Visit: Payer: Self-pay

## 2019-11-30 DIAGNOSIS — F331 Major depressive disorder, recurrent, moderate: Secondary | ICD-10-CM

## 2019-11-30 DIAGNOSIS — F4310 Post-traumatic stress disorder, unspecified: Secondary | ICD-10-CM

## 2019-11-30 DIAGNOSIS — J4530 Mild persistent asthma, uncomplicated: Secondary | ICD-10-CM

## 2019-11-30 DIAGNOSIS — M329 Systemic lupus erythematosus, unspecified: Secondary | ICD-10-CM

## 2019-11-30 MED ORDER — PAROXETINE HCL 40 MG PO TABS: 40.0000 mg | ORAL_TABLET | Freq: Every day | ORAL | 0 refills | Status: DC

## 2019-11-30 MED ORDER — HYDROXYZINE PAMOATE 50 MG PO CAPS: 50.0000 mg | ORAL_CAPSULE | Freq: Every day | ORAL | 0 refills | Status: DC

## 2019-11-30 NOTE — Chronic Care Management (AMB) (Signed)
   Social Work  Care Management Collaboration 11/30/2019 Name: Melissa Gaines MRN: [id number] DOB: January 16, 1971 Melissa Gaines is a 49 y.o. year old female who sees [name], MD for primary care. LCSW was consulted by PCP to assistance patient with  Level of Care Concerns for personal care services.  Intervention: Patient was not interviewed or contacted during this encounter.  LCSW collaborated with [facility]. Plan: LCSW will F/U with patient two weeks after the assessment  Review of patient status, including review of consultants reports, relevant laboratory and other test results, and collaboration with appropriate care team members and the patient's provider was performed as part of comprehensive patient evaluation and provision of chronic care management services.    Goals Addressed            This Visit's Progress   . personal care Serivce   On track    Melissa Gaines (see longitudinal plan of care for additional care plan information)  Current Barriers:   . Patient needs Support, Education, and Care Coordination to resolve unmet Personal Care needs . PCS Referral completed and [facility] . PCS assessment scheduled Sept 27th  Clinical Social Work Goal(s):  [name] Kitchen Over the next 30 to 45 days, patient will have personal care needs met as evident by having PCS Aide in the home assisting with needs.  Interventions provided by LCSW : . Assessed needs, level of care concerns, basic eligibility and provided education on Personal Care Service process,  . Provided list of PCS providers via e-mail . Collaborate with primary care provider ref completing PCS referral  . PCS referral faxed to [facility] at [phone] 11/22/2019  . LCSW collaborated with [facility] to verify application is received, processed and assessment scheduled  . Discussed other personal care options with patient (CAP program)   provided information for patient to call. Patient Self Care Activities & Deficits:  . Patient is unable to perform ADLs independently without assistance  . Family/support system will assist patient with meeting needs until [facility] is approved . Return calls from [facility] to set up initial PCS assessment once application is approved . Call [facility] with questions [phone] or [phone]  Please see past updates related to this goal by clicking on the "Past Updates" button in the selected goal      Melissa Gaines, [place] / [place]   [phone] 12:03 PM

## 2019-11-30 NOTE — Telephone Encounter (Signed)
Medication Assistance and SCATApplication form dropped off for at front desk for completion.  Verified that patient section of form has been completed.  Last DOS/WCC with PCP was 12/10/19.  Placed form in team folder to be completed by clinical staff.  Creig Hines

## 2019-11-30 NOTE — Telephone Encounter (Signed)
Clinical info completed on transportation form.  Place form in Dr. Jerrol Banana box for completion.  Bradyn Soward, CMA

## 2019-12-03 ENCOUNTER — Other Ambulatory Visit: Payer: Self-pay | Admitting: Hematology

## 2019-12-03 NOTE — Telephone Encounter (Signed)
Forms completed and mailed to patient as requested.  [name], MD [facility], PGY-2

## 2019-12-04 ENCOUNTER — Encounter (HOSPITAL_COMMUNITY): Payer: Self-pay | Admitting: Psychiatry

## 2019-12-04 ENCOUNTER — Other Ambulatory Visit: Payer: Self-pay

## 2019-12-04 ENCOUNTER — Ambulatory Visit (INDEPENDENT_AMBULATORY_CARE_PROVIDER_SITE_OTHER): Payer: Medicare HMO | Admitting: Psychiatry

## 2019-12-04 DIAGNOSIS — F331 Major depressive disorder, recurrent, moderate: Secondary | ICD-10-CM

## 2019-12-04 DIAGNOSIS — F4310 Post-traumatic stress disorder, unspecified: Secondary | ICD-10-CM | POA: Diagnosis not present

## 2019-12-04 MED ORDER — RESTASIS 0.05 % OP EMUL: 1.0000 [drp] | Freq: Two times a day (BID) | OPHTHALMIC | 0 refills | Status: DC

## 2019-12-04 MED ORDER — AIMOVIG 70 MG/ML ~~LOC~~ SOAJ: 70.0000 mg | SUBCUTANEOUS | 3 refills | Status: DC

## 2019-12-04 MED ORDER — GABAPENTIN 100 MG PO CAPS: ORAL_CAPSULE | ORAL | 3 refills | Status: DC

## 2019-12-04 MED ORDER — ALBUTEROL SULFATE HFA 108 (90 BASE) MCG/ACT IN AERS: 2.0000 | INHALATION_SPRAY | Freq: Four times a day (QID) | RESPIRATORY_TRACT | 3 refills | Status: DC | PRN

## 2019-12-04 NOTE — Progress Notes (Signed)
Virtual Visit via Video Note  I connected with Melissa Gaines on 12/04/19 at  2:30 PM EDT by a video enabled telemedicine application and verified that I am speaking with the correct person using two identifiers.  Location: Patient: Patient Home Provider: Home Office   I discussed the limitations of evaluation and management by telemedicine and the availability of in person appointments. The patient expressed understanding and agreed to proceed.  History of Present Illness: Client currently dealing with phsycial health issues, chonic and recent onset, which impact her mental health, increasing depressive symptoms and difficulty with tasks of daily living. 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 Client would like to better manage symptoms.   Treatment Plan Goals: 1) To reduce stress by improving effectiveness of parenting skills and strategies through consistent and proper application.  2) To decrease depressive symptoms by addressing pain management with providers, by applying CBT skills and through processing thoughts and feelings in session.  Observations/Objective: Counselor met with Client for individual therapy via Webex. Counselor assessed MH symptoms and progress on treatment plan goals, with patient reporting decline in health since our last session over 6 months ago. Client decided to decline continued treatment while Counselor was on leave due to lack of trust in new providers. Client noted that she continued to write and journal to release and express feelings. Client and Counselor worked to update Comprehensive Clinical Assessment to reflect current state and needs, as well as revising treatment plan goals to identify areas of growth and focus. Client shared updates on relationship with children, extended family and current medical issues/treatments needed. Client presents with moderate  depression and moderate anxiety. Client denied suicidal ideation or self-harm behaviors. Counselor and Client discussed frequency of sessions and worked to schedule upcoming appointments.    Assessment and Plan: Counselor will continue to meet with patient to address treatment plan goals. Patient will continue to follow recommendations of providers and implement skills learned in session.  Follow Up Instructions: Counselor will send information for next session via Webex.    The patient was advised to call back or seek an in-person evaluation if the symptoms worsen or if the condition fails to improve as anticipated.  I provided 55 minutes of non-face-to-face time during this encounter.   Lise Auer, LCSW

## 2019-12-04 NOTE — Telephone Encounter (Signed)
Refills sent to [facility] as requested. Refilled enough to last to pt appt 12/21

## 2019-12-04 NOTE — Telephone Encounter (Signed)
[place] called in to see if they can get these refills sent over to them. The patient is changing pharmacies.

## 2019-12-05 ENCOUNTER — Telehealth: Payer: Medicare HMO

## 2019-12-05 ENCOUNTER — Telehealth: Payer: Self-pay | Admitting: Licensed Clinical Social Worker

## 2019-12-05 NOTE — Chronic Care Management (AMB) (Signed)
    Clinical Social Work  Care Management Outreach   12/05/2019 Name: Melissa Gaines MRN: [id number] DOB: 27-May-1970  Melissa Gaines is a 49 y.o. year old female who is a primary care patient of [name], MD .  The Care Management team was consulted for assistance with Level of Care Concerns for personal care services.  LCSW reached out to [name] by phone to assess needs barriers with care plan goals. The outreach was unsuccessful. A HIPPA compliant phone message was left for the patient providing contact information and requesting a return call.   Plan: If no return call LCSW will F/U in 1 to 2 weeks  Review of patient status, including review of consultants reports, relevant laboratory and other test results, and collaboration with appropriate care team members and the patient's provider was performed as part of comprehensive patient evaluation and provision of care management services.    Casimer Lanius, [place] / [place]   [phone] 11:15 AM

## 2019-12-05 NOTE — Telephone Encounter (Signed)
Attempted to reach pt. No answer. LVM for pt to call the office to make appt to be seen for any future med refills that maybe needed. Salvatore Marvel, CMA

## 2019-12-07 ENCOUNTER — Other Ambulatory Visit: Payer: Self-pay

## 2019-12-07 ENCOUNTER — Ambulatory Visit (INDEPENDENT_AMBULATORY_CARE_PROVIDER_SITE_OTHER): Payer: Medicare Other | Admitting: Family Medicine

## 2019-12-07 ENCOUNTER — Encounter: Payer: Self-pay | Admitting: Family Medicine

## 2019-12-07 ENCOUNTER — Ambulatory Visit: Payer: Self-pay

## 2019-12-07 DIAGNOSIS — M25551 Pain in right hip: Secondary | ICD-10-CM | POA: Diagnosis not present

## 2019-12-07 NOTE — Progress Notes (Signed)
Office Visit Note   Patient: Melissa Gaines           Date of Birth: 03-14-1970           MRN: [id number] Visit Date: 12/07/2019 Requested by: [name], MD [id number],  [place] [address] PCP: [name], MD  Subjective: Chief Complaint  Patient presents with  . Right Hip - Pain    HPI: She is about 2 months status post fall resulting in right ilium fracture.  Since last visit she is still having pain, but the pain seems to be in the posterior hip rather than in the groin area.  She is using a walker for support.  She is taking vitamin D3.  She is not needing pain medication.  The posterior hip pain occasionally radiates into the posterior lateral aspect of her hip and thigh.              ROS:   All other systems were reviewed and are negative.  Objective: Vital Signs: There were no vitals taken for this visit.  Physical Exam:  General:  Alert and oriented, in no acute distress. Pulm:  Breathing unlabored. Psy:  Normal mood, congruent affect.  Right hip: She does not have much pain with passive internal rotation.  She has a little bit of pain with active flexion against resistance.  She is maximally tender over the right SI joint.  Imaging: XR HIP UNILAT W OR W/O PELVIS 2-3 VIEWS RIGHT  Result Date: 12/07/2019 X-rays of the right hip reveal no change in the alignment of the ileum fracture and the acetabular component.  I do not see much in the way of callus formation.  She has some degenerative change in the right SI joint.   Assessment & Plan: 1.  2 months status post fall with stable right ilium fracture and lucency around the acetabular component.  I think her primary source of pain today is the right SI joint. -Continue with weightbearing as tolerated.  Start physical therapy for treatments of her SI dysfunction.  If back pain persists, we will consider injection.  If the groin pain persist, then repeat x-rays in 3 to 4  weeks.     Procedures: No procedures performed  No notes on file     PMFS History: Patient Active Problem List   Diagnosis Date Noted  . Right hip pain 10/07/2019  . Left hip pain 06/26/2019  . Muscle strain of lower extremity, left, initial encounter 06/26/2019  . Healthcare maintenance 06/26/2019  . Iron deficiency anemia 04/14/2018  . Hematuria, gross 03/29/2018  . Iron deficiency anemia due to chronic blood loss 03/29/2018  . Long term current use of anticoagulant 03/29/2018  . Antiphospholipid antibody syndrome ([place]) 01/25/2018  . Morbid obesity ([place]) 07/26/2017  . Systemic lupus erythematosus ([place]) 07/08/2017  . Hx pulmonary embolism 07/08/2017  . Chronic pain of right knee 05/23/2017  . Renal mass 01/26/2017  . 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/21/2016  . Vitamin D deficiency 12/16/2016  . Anemia   . Migraine    Past Medical History:  Diagnosis Date  . Anemia   . Anxiety   . Asthma   . Chronic pain   . Congenital hip dislocation   . GERD (gastroesophageal reflux disease)   . Iron deficiency anemia 04/14/2018  . Kidney mass 2017  . Migraine   . Osteoporosis   . Pathological dislocation of shoulder joint, bilateral    congential  . PTSD (post-traumatic stress disorder)   .  Sleep apnea   . Systemic lupus erythematosus (HCC)     Family History  Problem Relation Age of Onset  . Asthma Mother   . Liver cancer Mother   . Heart Problems Mother        heart attack and heart failure  . Kidney Stones Mother   . Osteoporosis Mother   . Stroke Mother   . Depression Mother   . Anxiety disorder Mother   . Kidney Stones Father   . Hernia Father   . Kidney disease Father   . Depression Sister   . Anxiety disorder Sister   . Asthma Sister   . Cancer Maternal Grandmother   . Cancer Paternal Grandmother   . Cancer Other     Past Surgical History:  Procedure Laterality Date  . CESAREAN SECTION  2005  . CESAREAN SECTION W/BTL  2010  . HIP SURGERY     in childhood for  congenital hip dislocation  . [place] RESECTION  2014  . STAPEDECTOMY  11/01/2011   L postauricular stapedectomy with CO2 laser and insertion of 6 x 4.75 mm SMart Piston  . TOTAL HIP ARTHROPLASTY     05/2000 R, 11/2000 L   Social History   Occupational History  . Occupation: Disabled   Tobacco Use  . Smoking status: Never Smoker  . Smokeless tobacco: Never Used  Vaping Use  . Vaping Use: Never used  Substance and Sexual Activity  . Alcohol use: Not Currently    Comment: when she was 49 years old; none after having children   . Drug use: No  . Sexual activity: Not Currently    Partners: Male    Birth control/protection: Condom, Surgical

## 2019-12-10 ENCOUNTER — Other Ambulatory Visit: Payer: Self-pay

## 2019-12-10 ENCOUNTER — Ambulatory Visit: Payer: Medicare HMO | Admitting: Family Medicine

## 2019-12-10 DIAGNOSIS — M329 Systemic lupus erythematosus, unspecified: Secondary | ICD-10-CM

## 2019-12-11 ENCOUNTER — Telehealth: Payer: Self-pay

## 2019-12-11 NOTE — Telephone Encounter (Signed)
Faxed completed patient assistance application to [facility] today for Eliquis.

## 2019-12-12 ENCOUNTER — Telehealth: Payer: Self-pay | Admitting: Neurology

## 2019-12-12 ENCOUNTER — Ambulatory Visit: Payer: Medicare HMO

## 2019-12-13 NOTE — Telephone Encounter (Signed)
New script sent 12/04/19

## 2019-12-14 ENCOUNTER — Telehealth: Payer: Self-pay | Admitting: Neurology

## 2019-12-14 ENCOUNTER — Other Ambulatory Visit: Payer: Self-pay | Admitting: Neurology

## 2019-12-14 ENCOUNTER — Other Ambulatory Visit (HOSPITAL_COMMUNITY): Payer: Self-pay | Admitting: Psychiatry

## 2019-12-14 DIAGNOSIS — F4310 Post-traumatic stress disorder, unspecified: Secondary | ICD-10-CM

## 2019-12-14 NOTE — Telephone Encounter (Signed)
New script sent to [facility] on 12/04/19

## 2019-12-14 NOTE — Telephone Encounter (Signed)
Tried calling pt back no answer. LMOVM refills sent into [facility] on 9/28

## 2019-12-14 NOTE — Telephone Encounter (Signed)
Patient has some questions about medication that she states that we did not refill  Please call

## 2019-12-17 ENCOUNTER — Ambulatory Visit
Admission: RE | Admit: 2019-12-17 | Discharge: 2019-12-17 | Disposition: A | Discharge status: Home / Self Care | Payer: Medicare HMO | Source: Ambulatory Visit | Attending: *Deleted | Admitting: *Deleted

## 2019-12-17 ENCOUNTER — Other Ambulatory Visit: Payer: Self-pay

## 2019-12-17 DIAGNOSIS — Z1231 Encounter for screening mammogram for malignant neoplasm of breast: Secondary | ICD-10-CM

## 2019-12-17 DIAGNOSIS — J4530 Mild persistent asthma, uncomplicated: Secondary | ICD-10-CM

## 2019-12-18 ENCOUNTER — Inpatient Hospital Stay: Payer: Medicare Other | Attending: Nurse Practitioner

## 2019-12-18 ENCOUNTER — Other Ambulatory Visit: Payer: Self-pay

## 2019-12-18 ENCOUNTER — Ambulatory Visit: Payer: Medicare Other | Admitting: Licensed Clinical Social Worker

## 2019-12-18 VITALS — BP 103/54 | HR 78 | Temp 98.8°F | Resp 18

## 2019-12-18 DIAGNOSIS — Z741 Need for assistance with personal care: Secondary | ICD-10-CM

## 2019-12-18 DIAGNOSIS — D508 Other iron deficiency anemias: Secondary | ICD-10-CM

## 2019-12-18 DIAGNOSIS — E538 Deficiency of other specified B group vitamins: Secondary | ICD-10-CM | POA: Insufficient documentation

## 2019-12-18 MED ORDER — CYANOCOBALAMIN 1000 MCG/ML IJ SOLN
1000.0000 ug | INTRAMUSCULAR | Status: DC
  Administered 2019-12-18: 1000 ug via INTRAMUSCULAR

## 2019-12-18 MED ORDER — CYANOCOBALAMIN 1000 MCG/ML IJ SOLN
INTRAMUSCULAR | Status: AC
  Filled 2019-12-18: qty 1

## 2019-12-18 NOTE — Chronic Care Management (AMB) (Signed)
Care Management   Clinical Social Work Follow Up   12/18/2019 Name: Melissa Gaines MRN: [id number] DOB: [birth year] Referred by: [name], MD  Reason for referral : Care Coordination (PCS)  Melissa Gaines is a 49 y.o. year old female who is a primary care patient of [name], MD.  Reason for follow-up: assess for barriers and progress with personal care services .   Assessment: Patient unable to talk long, PCS services have not started.    Review of patient status, including review of consultants reports, relevant laboratory and other test results, and collaboration with appropriate care team members and the patient's provider was performed as part of comprehensive patient evaluation and provision of care management services.   Plan: Patient would like continued follow-up. LCSW will reach out to patient again in the next 24 to 48 hour for to assist with removing barriers  Advance Directive Status:  not addressed during this encounter.  SDOH (Social Determinants of Health) assessments performed: ; No needs identified in this encounter   Goals Addressed            This Visit's Progress   . personal care Serivce   On track    [place] (see longitudinal plan of care for additional care plan information)  Current Barriers:   . Patient needs Support, Education, and Care Coordination to resolve unmet Personal Care needs . PCS Referral [facility] . PCS assessment scheduled Sept 27th; patient has selected agency but not sure when the [facility] services will start Clinical Social Work Goal(s):  [name] Kitchen Over the next 30 to 45 days, patient will have personal care needs met as evident by having PCS Aide in the home assisting with needs.  Interventions provided by LCSW : . Assessed needs, barriers and progress with Personal Care Service . Discussed other personal care options with patient (CAP program)  provided  information for patient to call. Patient Self Care Activities & Deficits:  . Patient is unable to perform ADLs independently without assistance  . Family/support system will assist patient with meeting needs until [facility] is approved . Return calls from [facility] to set up initial PCS assessment once application is approved . Call [facility] with questions [phone] or [phone]  Please see past updates related to this goal by clicking on the "Past Updates" button in the selected goal        Outpatient Encounter Medications as of 12/18/2019  Medication Sig  . acetaminophen (TYLENOL) 500 MG tablet Take 500 mg by mouth every 6 (six) hours as needed for headache.  . albuterol (VENTOLIN HFA) 108 (90 Base) MCG/ACT inhaler Inhale 2 puffs into the lungs every 6 (six) hours as needed for wheezing or shortness of breath.  [name] Kitchen apixaban (ELIQUIS) 5 MG TABS tablet Take 5 mg by mouth 2 (two) times daily.  . cyanocobalamin (,VITAMIN B-12,) 1000 MCG/ML injection Inject 1 mL (1,000 mcg total) into the skin every 30 (thirty) days.  . cycloSPORINE (RESTASIS) 0.05 % ophthalmic emulsion Place 1 drop into both eyes 2 (two) times daily.  [name] (AIMOVIG) 70 MG/ML SOAJ Inject 70 mg into the skin every 30 (thirty) days.  . ferrous sulfate 325 (65 FE) MG tablet Take 325 mg by mouth daily with breakfast.  . gabapentin (NEURONTIN) 100 MG capsule TAKE 3 CAPSULE EVERY NIGHT AT BEDTIME  . hydrOXYzine (VISTARIL) 50 MG capsule Take 1 capsule (50 mg total) by mouth at bedtime.  [name] Kitchen  naproxen (NAPROSYN) 500 MG tablet Take 500 mg by mouth 2 (two) times daily.  [name] Kitchen omeprazole (PRILOSEC) 40 MG capsule Take 40 mg by mouth daily.  [name] Kitchen PARoxetine (PAXIL) 40 MG tablet Take 1 tablet (40 mg total) by mouth daily.  . SYRINGE/NEEDLE, DISP, 1 ML 25G X 5/8" 1 ML MISC Match with B12  . Vitamin D, Ergocalciferol, (DRISDOL) 50000 units CAPS capsule Take 1 capsule (50,000 Units total) by mouth every 7 (seven) days.  .  [DISCONTINUED] cyanocobalamin ((VITAMIN B-12)) injection 1,000 mcg    No facility-administered encounter medications on file as of 12/18/2019.   Casimer Lanius, [place] / [place]   [phone] 11:24 AM

## 2019-12-19 MED ORDER — ALBUTEROL SULFATE HFA 108 (90 BASE) MCG/ACT IN AERS: 2.0000 | INHALATION_SPRAY | Freq: Four times a day (QID) | RESPIRATORY_TRACT | 3 refills | Status: DC | PRN

## 2019-12-19 MED ORDER — OMEPRAZOLE 40 MG PO CPDR
40.0000 mg | DELAYED_RELEASE_CAPSULE | Freq: Every day | ORAL | 2 refills | Status: DC
Start: 2019-12-19 — End: 2020-08-13

## 2019-12-25 NOTE — Telephone Encounter (Signed)
Refaxed application today.  [place] states that they have no record of receiving faxed application from 58/34/62.

## 2019-12-26 ENCOUNTER — Ambulatory Visit: Payer: Medicare HMO | Admitting: Family Medicine

## 2019-12-26 ENCOUNTER — Ambulatory Visit: Payer: Medicare Other | Admitting: Licensed Clinical Social Worker

## 2019-12-26 DIAGNOSIS — Z139 Encounter for screening, unspecified: Secondary | ICD-10-CM

## 2019-12-26 DIAGNOSIS — Z741 Need for assistance with personal care: Secondary | ICD-10-CM

## 2019-12-26 NOTE — Patient Instructions (Signed)
  [name]  it was nice speaking with you. Please call me directly [phone] if you have questions about the goals we discussed. Goals Addressed            This Visit's Progress   . COMPLETED: ADL's met by personal care Serivce       . Call you personal care agency or [facility] with questions [phone] or [phone]      . would like SCAT Transportation to go to none medical places   Not on track     [name] received Care Management services today:  1. Care Management services include personalized support from designated clinical staff supervised by her physician, including individualized plan of care and coordination with other care providers 2. 24/7 contact [phone] for assistance for urgent and routine care needs. 3. Care Management are voluntary services and be declined at any time by calling the office.  Patient verbalizes understanding of instructions provided today.  Follow up plan: Client will contact office if needed  Maurine Cane, LCSW

## 2019-12-26 NOTE — Chronic Care Management (AMB) (Signed)
Care Management   Clinical Social Work Follow Up   12/26/2019 Name: Melissa Gaines MRN: [id number] DOB: 03-Dec-1970 Referred by: [name], MD  Reason for referral : Care Coordination (f/u)  Melissa Gaines is a 49 y.o. year old female who is a primary care patient of [name], MD.  Reason for follow-up: assess for barriers and progress with personal care needs.    Assessment: Patient has completed personal care goal.  PCS worker started last week about 3 hours each day.  Only concerns during this encounter is non medical related transportation. Patient wanted to know if PCP still has the SCAT application of did she fax it. LCSW was able to verify with PCP, that the application has been sent to [employer].  Current Barriers:  Waiting on SCAT application to be processed  Plan:No new goals identified during this encounter. LCSW will discontinue outreach if no new needs arise in 3 to 4 weeks  Interventions provided by LCSW:   Assessment of needs, as well as how impacting, barriers , progress and outcome     . Collaborated with PCP regarding completed SCAT application verified application has been sent .  Informed patient to f/u with [facility] for SCAT application   Advance Directive Status:  not addressed during this encounter.  SDOH (Social Determinants of Health) assessments performed: Yes ; SDOH Interventions     Most Recent Value  SDOH Interventions  Transportation Interventions [employer] ([employer])      Goals Addressed            This Visit's Progress   . COMPLETED: ADL's met by personal care Serivce       . Call you personal care agency or [facility] with questions [phone] or [phone]     . would like SCAT Transportation to go to none medical places   Not on track      Outpatient Encounter Medications as of 12/26/2019  Medication Sig  . acetaminophen (TYLENOL) 500 MG tablet Take 500 mg by  mouth every 6 (six) hours as needed for headache.  . albuterol (VENTOLIN HFA) 108 (90 Base) MCG/ACT inhaler Inhale 2 puffs into the lungs every 6 (six) hours as needed for wheezing or shortness of breath.  [name] Kitchen apixaban (ELIQUIS) 5 MG TABS tablet Take 5 mg by mouth 2 (two) times daily.  . cyanocobalamin (,VITAMIN B-12,) 1000 MCG/ML injection Inject 1 mL (1,000 mcg total) into the skin every 30 (thirty) days.  . cycloSPORINE (RESTASIS) 0.05 % ophthalmic emulsion Place 1 drop into both eyes 2 (two) times daily.  [name] (AIMOVIG) 70 MG/ML SOAJ Inject 70 mg into the skin every 30 (thirty) days.  . ferrous sulfate 325 (65 FE) MG tablet Take 325 mg by mouth daily with breakfast.  . gabapentin (NEURONTIN) 100 MG capsule TAKE 3 CAPSULE EVERY NIGHT AT BEDTIME  . hydrOXYzine (VISTARIL) 50 MG capsule Take 1 capsule (50 mg total) by mouth at bedtime.  . naproxen (NAPROSYN) 500 MG tablet Take 500 mg by mouth 2 (two) times daily.  [name] Kitchen omeprazole (PRILOSEC) 40 MG capsule Take 1 capsule (40 mg total) by mouth daily.  [name] Kitchen PARoxetine (PAXIL) 40 MG tablet Take 1 tablet (40 mg total) by mouth daily.  . SYRINGE/NEEDLE, DISP, 1 ML 25G X 5/8" 1 ML MISC Match with B12  . Vitamin D, Ergocalciferol, (DRISDOL) 50000 units CAPS capsule Take 1 capsule (50,000 Units total) by mouth every 7 (seven) days.   No facility-administered encounter  medications on file as of 12/26/2019.   Review of patient status, including review of consultants reports, relevant laboratory and other test results, and collaboration with appropriate care team members and the patient's provider was performed as part of comprehensive patient evaluation and provision of care management services.   Casimer Lanius, [place] / [place]   [phone] 4:19 PM

## 2019-12-28 ENCOUNTER — Ambulatory Visit: Payer: Medicare HMO | Admitting: Rehabilitative and Restorative Service Providers"

## 2019-12-29 ENCOUNTER — Other Ambulatory Visit: Payer: Self-pay | Admitting: Family Medicine

## 2019-12-29 DIAGNOSIS — J4530 Mild persistent asthma, uncomplicated: Secondary | ICD-10-CM

## 2020-01-01 ENCOUNTER — Telehealth: Payer: Self-pay

## 2020-01-01 NOTE — Telephone Encounter (Signed)
ELIQUIS--spoke with patient today.  Patient is aware that her patient assistance application with [employer] [place] [phone]) has been approved thru 03/07/2020 and she will have to re-apply for the year 2022.  I mailed out the patient portion for the re-enrollment application today.  Patient stated that she had not received the medication as of yet, I advised that if she does not receive it by the end of this week to let me know so I can reach out to [employer].  The medication is to be delivered to her home address, no signature required.

## 2020-01-08 ENCOUNTER — Other Ambulatory Visit: Payer: Self-pay | Admitting: Family Medicine

## 2020-01-08 ENCOUNTER — Ambulatory Visit (INDEPENDENT_AMBULATORY_CARE_PROVIDER_SITE_OTHER): Payer: Medicare Other | Admitting: Psychiatry

## 2020-01-08 ENCOUNTER — Other Ambulatory Visit: Payer: Self-pay

## 2020-01-08 ENCOUNTER — Encounter (HOSPITAL_COMMUNITY): Payer: Self-pay | Admitting: Psychiatry

## 2020-01-08 DIAGNOSIS — F4310 Post-traumatic stress disorder, unspecified: Secondary | ICD-10-CM | POA: Diagnosis not present

## 2020-01-08 DIAGNOSIS — F331 Major depressive disorder, recurrent, moderate: Secondary | ICD-10-CM

## 2020-01-08 DIAGNOSIS — J4540 Moderate persistent asthma, uncomplicated: Secondary | ICD-10-CM

## 2020-01-08 MED ORDER — ALBUTEROL SULFATE HFA 108 (90 BASE) MCG/ACT IN AERS: 2.0000 | INHALATION_SPRAY | Freq: Four times a day (QID) | RESPIRATORY_TRACT | 2 refills | Status: DC | PRN

## 2020-01-08 NOTE — Progress Notes (Signed)
Received communication from patient's insurance that formulary only covers ProAir albuterol, not ventolin albuterol. Rx sent in as brand pro air.  [name], MD [facility], PGY-2

## 2020-01-08 NOTE — Progress Notes (Signed)
Virtual Visit via Video Note  I connected with Melissa Gaines on 01/08/20 at  3:30 PM EDT by a video enabled telemedicine application and verified that I am speaking with the correct person using two identifiers.  Location: Patient: Patient Home Provider: Home Office  I discussed the limitations of evaluation and management by telemedicine and the availability of in person appointments. The patient expressed understanding and agreed to proceed.  History of Present Illness: PTSD  Treatment Plan Goals: 1) To reduce stress by improving effectiveness of parenting skills and strategies through consistent and proper application.  2) To decrease depressive symptoms by addressing pain management with providers, by applying CBT skills and through processing thoughts and feelings in session.  Observations/Objective: Counselor met with Client for individual therapy via Webex. Counselor assessed MH symptoms and progress on treatment plan goals, with patient reporting overall stabilization of mental health and emotional health issues. Client reports ongoing issues with physical health that impact daily functioning, with ongoing efforts and collaboration with providers to address and improve. Client presents with moderate depression and mild anxiety. Client denied suicidal ideation or self-harm behaviors.   Goal 1: Counselor assessed parenting concerns and progress towards implementing parenting strategies discussed in past sessions. Client reports that her children did "ok" on their progress reports, each only failing one course. Client believes children can work with teachers to improve scores in upcoming quarter. Counselor used solution focused interventions to identify ways Client can set limits, boundaries and communicate assertively to improve functioning of children.   Goal 2: Counselor assessed participation in medical care and follow up with provider recommendations to  better manage pain and health conditions. Client shared about medication regimen and behavioral changes made to alleviate pain. Counselor assessed impact on depressive symptoms. Client identified a variety of skills she is using to combat depression and stay socially connected with others/find ways to express thoughts and feelings using CBT recommendations from past sessions.  Client making progress towards goals.   Assessment and Plan: Counselor will continue to meet with patient to address treatment plan goals. Patient will continue to follow recommendations of providers and implement skills learned in session.  Follow Up Instructions: Counselor will send information for next session via Webex.   The patient was advised to call back or seek an in-person evaluation if the symptoms worsen or if the condition fails to improve as anticipated.  I provided 55 minutes of non-face-to-face time during this encounter.   Lise Auer, LCSW

## 2020-01-11 ENCOUNTER — Inpatient Hospital Stay: Payer: Medicare Other | Attending: Nurse Practitioner

## 2020-01-11 ENCOUNTER — Other Ambulatory Visit: Payer: Self-pay

## 2020-01-11 VITALS — BP 135/76 | HR 81 | Resp 18

## 2020-01-11 DIAGNOSIS — Z79899 Other long term (current) drug therapy: Secondary | ICD-10-CM | POA: Diagnosis not present

## 2020-01-11 DIAGNOSIS — D508 Other iron deficiency anemias: Secondary | ICD-10-CM

## 2020-01-11 DIAGNOSIS — E538 Deficiency of other specified B group vitamins: Secondary | ICD-10-CM | POA: Insufficient documentation

## 2020-01-11 MED ORDER — CYANOCOBALAMIN 1000 MCG/ML IJ SOLN
INTRAMUSCULAR | Status: AC
  Filled 2020-01-11: qty 1

## 2020-01-11 MED ORDER — CYANOCOBALAMIN 1000 MCG/ML IJ SOLN
1000.0000 ug | INTRAMUSCULAR | Status: DC
  Administered 2020-01-11: 1000 ug via INTRAMUSCULAR

## 2020-01-15 ENCOUNTER — Other Ambulatory Visit: Payer: Self-pay

## 2020-01-15 ENCOUNTER — Ambulatory Visit: Payer: Medicare Other | Attending: Family Medicine

## 2020-01-15 DIAGNOSIS — M6281 Muscle weakness (generalized): Secondary | ICD-10-CM | POA: Insufficient documentation

## 2020-01-15 DIAGNOSIS — G8929 Other chronic pain: Secondary | ICD-10-CM | POA: Diagnosis not present

## 2020-01-15 DIAGNOSIS — R262 Difficulty in walking, not elsewhere classified: Secondary | ICD-10-CM | POA: Diagnosis not present

## 2020-01-15 DIAGNOSIS — M25651 Stiffness of right hip, not elsewhere classified: Secondary | ICD-10-CM | POA: Insufficient documentation

## 2020-01-15 DIAGNOSIS — M545 Low back pain, unspecified: Secondary | ICD-10-CM | POA: Diagnosis not present

## 2020-01-15 DIAGNOSIS — M62830 Muscle spasm of back: Secondary | ICD-10-CM | POA: Diagnosis not present

## 2020-01-15 NOTE — Therapy (Signed)
[place], [place], [address] Phone: [phone]   Fax:  [phone]  Physical Therapy Evaluation  Patient Details  Name: Melissa Gaines MRN: [id number] Date of Birth: [birth year] Referring Provider (PT): Eunice Blase, MD   Encounter Date: 01/15/2020   PT End of Session - 01/15/20 0854    Visit Number 1    Number of Visits 16    Date for PT Re-Evaluation 02/04/20    Authorization Type UHC MCR/ MCD    Authorization Time Period FOTO visit 6-7,             [place] visit 16    Progress Note Due on Visit 10    PT Start Time [phone]   pt late   PT Stop Time 0915    PT Time Calculation (min) 28 min    Activity Tolerance Patient limited by pain    Behavior During Therapy [facility] for tasks assessed/performed           Past Medical History:  Diagnosis Date   Anemia    Anxiety    Asthma    Chronic pain    Congenital hip dislocation    GERD (gastroesophageal reflux disease)    Iron deficiency anemia 04/14/2018   Kidney mass 2017   Migraine    Osteoporosis    Pathological dislocation of shoulder joint, bilateral    congential   PTSD (post-traumatic stress disorder)    Sleep apnea    Systemic lupus erythematosus ([place])     Past Surgical History:  Procedure Laterality Date   CESAREAN SECTION  2005   CESAREAN SECTION W/BTL  2010   HIP SURGERY     in childhood for congenital hip dislocation   LAPAROSCOPIC GASTRIC SLEEVE RESECTION  2014   STAPEDECTOMY  11/01/2011   L postauricular stapedectomy with CO2 laser and insertion of 6 x 4.75 mm SMart Piston   TOTAL HIP ARTHROPLASTY     05/2000 R, 11/2000 L    There were no vitals filed for this visit.    Subjective Assessment - 01/15/20 0850    Subjective She reports fall and fracture in pelvis and now with pain in RT back and pelvis.   Fall was 2 months.   MD has iven medication for pain.   Want to see if cna decrease presure to  lower back    Pertinent History Bilateral THA early 2000's    Limitations Walking;Sitting   cleaning   How long can you walk comfortably? houshold distance.    100 feet or so out of home.    Diagnostic tests xrays    Patient Stated Goals She wants to decr pain, walk without walker    Currently in Pain? Yes    Pain Score 9     Pain Location Back    Pain Orientation Right;Lower    Pain Descriptors / Indicators Tightness    Pain Type Chronic pain    Pain Radiating Towards RT hip    Pain Onset More than a month ago    Pain Frequency Constant    Aggravating Factors  sitting and standing for 5 min    Pain Relieving Factors lye on stomach              [facility] PT Assessment - 01/15/20 0001      Assessment   Referring Provider (PT) Eunice Blase, MD    Onset Date/Surgical Date --   5 months ago   Next MD  Visit 6 monyths from now/ not sure    Prior Therapy PT for hip pain from fall to LT       Precautions   Precautions Fall      Restrictions   Weight Bearing Restrictions No      Balance Screen   Has the patient fallen in the past 6 months Yes    How many times? 1   tripped   Has the patient had a decrease in activity level because of a fear of falling?  Yes    Is the patient reluctant to leave their home because of a fear of falling?  Yes      Prior Function   Level of Independence Requires assistive device for independence;Needs assistance with ADLs;Needs assistance with homemaking    Vocation On disability      Cognition   Overall Cognitive Status Within Functional Limits for tasks assessed      Observation/Other Assessments   Focus on Therapeutic Outcomes (FOTO)  18% with probable improvement to 37%      ROM / Strength   AROM / PROM / Strength AROM;Strength;PROM      AROM   AROM Assessment Site Lumbar    Lumbar Flexion 25    Lumbar Extension 10    Lumbar - Right Side Bend 15    Lumbar - Left Side Bend 15      PROM   Overall PROM Comments Decreased Lt hp ROM but  WFL passively  RT hip flexion to 90 degrees ER 45 IR 20      Strength   Overall Strength Comments Grossly WNL in both LE except RT abductors and gluteals. 3-4/5  but complaints of pain with testing       Flexibility   Soft Tissue Assessment /Muscle Length yes    Hamstrings SLR LT 60 RT 35       Palpation   SI assessment  Fortin's sign , tendr RT SI and gluteals  to hip no significant tenderness glut med and anterior but some.     Palpation comment Short Lt leg in supine                      Objective measurements completed on examination: See above findings.               PT Education - 01/15/20 1150    Education Details POC , FOTO score and probable progress, practicing breathing and working to ease pain so emotional reaction does not facilitate  increase pain    Person(s) Educated Patient    Methods Explanation    Comprehension Verbalized understanding            PT Short Term Goals - 01/15/20 7017      PT SHORT TERM GOAL #1   Title pt will be indpendent with short term HEP    Time 3    Period Weeks    Status New      PT SHORT TERM GOAL #2   Title She will report pain decreased 25% with walking and standing for 10 min    Time 3    Period Weeks    Status New      PT SHORT TERM GOAL #3   Title She will be able to bend 60 degrees before incr pain    Time 3    Period Weeks    Status New      PT SHORT TERM GOAL #4  Title FOTO score improved 8-10 points    Time 3    Period Weeks    Status New             PT Long Term Goals - 01/15/20 0941      PT LONG TERM GOAL #1   Title She will be able to walk with least restricive device out of home for shopping and access to appointments    Time 8    Period Weeks    Status New      PT LONG TERM GOAL #2   Title pt will be able to get into/out of bed & car with min difficulty    Time 8    Period Weeks    Status New      PT LONG TERM GOAL #3   Title She will be able to walk in home with  no device with min pain    Time 8    Period Weeks    Status New      PT LONG TERM GOAL #4   Title pt will be independent in long term HEP    Time 8    Period Weeks    Status New      PT LONG TERM GOAL #5   Title FOTO score improved to 35% or better    Time 8    Period Weeks    Status New                  Plan - 01/15/20 7915    Clinical Impression Statement Assessment limited due to pt being late for eval . Ms Acie Fredrickson presents with complaint of RT hip and back pain post fall in late July or early August 2021. She sustained a pelvic fracture.  She now reports severe pain in RT SI area. She shows no signs of distress sitting for eval   She was tender to palpation RT SI area and soft tissue toward RT hip.   ROM was painful in all directions with some RT hip weakness.   She is using a RW to walk  . She is not walking much more than household distance. She should improve with skilled PT , a consistent HEP and some pain education to decr pain and improve function    Personal Factors and Comorbidities Fitness;Past/Current Experience;Time since onset of injury/illness/exacerbation;Comorbidity 1    Comorbidities pelvic fracture  , falls, obesity, Bilateral THA    Examination-Activity Limitations Bathing;Locomotion Level;Sit;Carry;Stairs;Squat;Lift    Examination-Participation Restrictions Cleaning;Community Activity;Laundry;Shop;Meal Prep    Stability/Clinical Decision Making Evolving/Moderate complexity    Clinical Decision Making Moderate    Rehab Potential Good    PT Frequency 2x / week    PT Duration 8 weeks    PT Treatment/Interventions Dry needling;Passive range of motion;Patient/family education;Iontophoresis 4mg /ml Dexamethasone;Electrical Stimulation;Ultrasound;Moist Heat;Manual techniques    PT Next Visit Plan Start HEP for ROM ,   manual for STW,    Modalities PRN    RT SI exerciss  6 min walk test,  TUG    Consulted and Agree with Plan of Care Patient            Patient will benefit from skilled therapeutic intervention in order to improve the following deficits and impairments:  Pain, Postural dysfunction, Decreased strength, Decreased activity tolerance, Decreased range of motion, Difficulty walking, Decreased endurance  Visit Diagnosis: Stiffness of right hip, not elsewhere classified - Plan: PT plan of care cert/re-cert  Difficulty in walking, not elsewhere classified -  Plan: PT plan of care cert/re-cert  Muscle weakness (generalized) - Plan: PT plan of care cert/re-cert  Chronic right-sided low back pain without sciatica - Plan: PT plan of care cert/re-cert  Muscle spasm of back - Plan: PT plan of care cert/re-cert     Problem List Patient Active Problem List   Diagnosis Date Noted   Right hip pain 10/07/2019   Left hip pain 06/26/2019   Muscle strain of lower extremity, left, initial encounter 06/26/2019   Healthcare maintenance 06/26/2019   Iron deficiency anemia 04/14/2018   Hematuria, gross 03/29/2018   Iron deficiency anemia due to chronic blood loss 03/29/2018   Long term current use of anticoagulant 03/29/2018   Antiphospholipid antibody syndrome ([place]) 01/25/2018   Morbid obesity ([place]) 07/26/2017   Systemic lupus erythematosus ([place]) 07/08/2017   Hx pulmonary embolism 07/08/2017   Chronic pain of right knee 05/23/2017   Renal mass 01/26/2017   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/21/2016   Vitamin D deficiency 12/16/2016   Anemia    Migraine     Darrel Hoover 01/15/2020, 12:00 PM  [place] [facility] [address] [place], [place], [address] Phone: [phone]   Fax:  [phone]  Name: Melissa Gaines MRN: [id number] Date of Birth: [birth year]

## 2020-01-16 ENCOUNTER — Telehealth: Payer: Self-pay

## 2020-01-16 DIAGNOSIS — J4540 Moderate persistent asthma, uncomplicated: Secondary | ICD-10-CM

## 2020-01-16 MED ORDER — PROAIR RESPICLICK 108 (90 BASE) MCG/ACT IN AEPB: 1.0000 | INHALATION_SPRAY | RESPIRATORY_TRACT | 3 refills | Status: DC | PRN

## 2020-01-16 NOTE — Telephone Encounter (Signed)
Rx sent.  [name], MD [facility], PGY-2

## 2020-01-25 ENCOUNTER — Other Ambulatory Visit: Payer: Self-pay

## 2020-01-25 ENCOUNTER — Ambulatory Visit (INDEPENDENT_AMBULATORY_CARE_PROVIDER_SITE_OTHER): Payer: Medicare Other | Admitting: Family Medicine

## 2020-01-25 ENCOUNTER — Encounter: Payer: Self-pay | Admitting: Family Medicine

## 2020-01-25 VITALS — BP 102/56 | HR 88 | Ht <= 58 in | Wt 218.4 lb

## 2020-01-25 DIAGNOSIS — E8881 Metabolic syndrome: Secondary | ICD-10-CM

## 2020-01-25 DIAGNOSIS — J4540 Moderate persistent asthma, uncomplicated: Secondary | ICD-10-CM | POA: Diagnosis not present

## 2020-01-25 DIAGNOSIS — M545 Low back pain, unspecified: Secondary | ICD-10-CM

## 2020-01-25 MED ORDER — ALBUTEROL SULFATE (2.5 MG/3ML) 0.083% IN NEBU: 2.5000 mg | INHALATION_SOLUTION | Freq: Four times a day (QID) | RESPIRATORY_TRACT | 1 refills | Status: DC | PRN

## 2020-01-25 NOTE — Patient Instructions (Signed)
It was a pleasure to see you today!  1. Keep going with the good work with physical therapy. Keep those daily small goals of walking going. You are doing a great job. Let's follow up in 3 months or before if you have a new concern.  2. For weight loss and healthy eating, I recommend you call Dr. Jenne Campus, our nutritionist (see card). You can schedule with her directly. Keep up the good work you've already started with making healthy choices.    Be Well,  Dr. Chauncey Reading

## 2020-01-25 NOTE — Assessment & Plan Note (Signed)
Referral to nutrition, Dr. Jenne Campus' card given.

## 2020-01-25 NOTE — Assessment & Plan Note (Signed)
Patient with R sided lumbar back pain, no red flags or radiculopathy. Patient had lumbar plain film in 10/2019 that showed: "No fracture or spondylolisthesis is noted. Disc spaces are well-maintained. Minimal anterior osteophyte formation is noted at L1-2, L2-3 and L3-4." She is followed by orthopedics and started PT for back pain last week. Recommend continued current therapy, continued exercise, and weight loss. Patient to continue following with orthopedics, she reports next considerations are injection and possibly surgery as a last resort.

## 2020-01-25 NOTE — Progress Notes (Signed)
° ° °  SUBJECTIVE:   CHIEF COMPLAINT / HPI: back pain  Back pain:  Describes aching pain in right lumbar region of back, worse when lying flat on back, or staying in any one position for too long.  Pain is 6-9/10, not relieved with tylenol.  Patient follows with Dr. Junius Roads, orthopedics, started doing PT last week for back. She has h/o hip replacement for avascular necrosis, then had pelvic fracture from a fall in July/August. She is still walking with a walker, increasing independence, walks to end of yard/corner and back and practices steps and exercises from PT everyday.  Has had increased stresses at home and at work, just started CBT with a therapist this month as well.  No radiation, no incontinence or saddle anesthesia. No LE weakness or changes in gait.  No motor weakness/decreased sensation BL LE's.  No fevers or chills.  No dysuria, hematuria, urinary frequency, incontinence of bladder or bowel.     Patient has been trying to make changes in order to lose weight. BMI >40, she will greatly benefit from dietary counseling in order to lose weight. She has stopped buying traditional snack or "junk" foods, much to her children's chagrin. She buys fresh fruit for snacks now. She would like referral to nutritionist. She has made some progress in the last year, down from ~225lbs to 218 lbs. In order to improve function, pain, decrease risk for other health problems like DM, and if she were to undergo surgery for her back eventually, losing weight will help improve all of these issues.   Will hold off on pap smear as patient is not able to position for this due to injuries/pain.   PERTINENT  PMH / PSH: APS, SLE, h/o avascular necrosis s/p replacement of hip, s/p pelvic fracture  OBJECTIVE:  Nursing note and vitals reviewed BP (!) 102/56    Pulse 88    Ht 4\' 10"  (1.473 m)    Wt 218 lb 6 oz (99.1 kg)    LMP 01/18/2020    SpO2 96%    BMI 45.64 kg/m   HEENT: Sclera without injection or icterus. MMM.    Neck: Supple.  Cardiac: Regular rate and rhythm. Normal S1/S2. No murmurs, rubs, or gallops appreciated. Lungs: Clear bilaterally to ascultation.  Neuro: AOx3, patellar reflex 2+ MSK: Lumbar spine: - Inspection: no gross deformity or asymmetry, swelling or ecchymosis. No skin changes - Palpation: TTP over the R paraspinal muscles and R SI joints, no central spinous process TTP - ROM: limited active ROM of the lumbar spine in flexion and extension with pain - Strength: 4/5 strength of lower extremity in L4-S1 nerve root distributions b/l - Neuro: sensation intact in the L4-S1 nerve root distribution b/l, 2+ L4 and S1 reflexes - Straight Leg Raise test: positive on ipsilateral (R) side Psych: Pleasant and appropriate   ASSESSMENT/PLAN:  49 yo woman presenting today for back pain, weight loss.  No problem-specific Assessment & Plan notes found for this encounter.     [name], MD [place]

## 2020-01-28 ENCOUNTER — Other Ambulatory Visit: Payer: Self-pay

## 2020-01-28 ENCOUNTER — Encounter (HOSPITAL_COMMUNITY): Payer: Self-pay | Admitting: Psychiatry

## 2020-01-28 ENCOUNTER — Ambulatory Visit (INDEPENDENT_AMBULATORY_CARE_PROVIDER_SITE_OTHER): Payer: Medicare Other | Admitting: Psychiatry

## 2020-01-28 DIAGNOSIS — F331 Major depressive disorder, recurrent, moderate: Secondary | ICD-10-CM | POA: Diagnosis not present

## 2020-01-28 NOTE — Progress Notes (Signed)
Virtual Visit via Video Note  I connected with [name] on 01/28/20 at  3:30 PM EST by a video enabled telemedicine application and verified that I am speaking with the correct person using two identifiers.  Location: Patient: Patient Home Provider: Home Office  I discussed the limitations of evaluation and management by telemedicine and the availability of in person appointments. The patient expressed understanding and agreed to proceed.  History of Present Illness: PTSD  Treatment Plan Goals: 1)To reduce stress by improving effectiveness of parenting skills and strategies through consistent and proper application.  2)To decrease depressive symptoms by addressing pain management with providers, by applying CBT skills and through processing thoughts and feelings in session.  Observations/Objective: Counselor met with Client for individual therapy via Webex. Counselor assessed MH symptoms and progress on treatment plan goals, with patient reportingthat she is currently in high level of pain, making it difficult to process thoughts and feelings effectively.Client expressed desire to continue on with session, despite pain. Client presents withmoderatedepression and mildanxiety. Client denied suicidal ideation or self-harm behaviors.   Goal 1: Counselor processed and assessed efforts to this goal. Client shared that her daughter made it on a high school sports team, which is a first for their family, discussing changes in routine, trust and communication. Client stated that she doesn't agree with her being on the team, concerned about her health and potential injuries, however, she is glad it will be positive for her socially, physically, mentally and academically. Client gave updates on how she is currently managing communications with the children's father and the boundaries she has put in place to protect her mental and emotional health.   Goal 2: Counselor  processed onset of pain and her efforts to manage pain levels, recovery and mental coping. Client stated that current severe pain was from having to leave home in the cold to take son to a well-child check. Client stated that she has a plan for recovery and is realistic and thinking rationally about focusing on the aspects of the pain she can control by using pain management techniques recommended by providers.   Assessment and Plan: Counselor will continue to meet with patient to address treatment plan goals. Patient will continue to follow recommendations of providers and implement skills learned in session.  Follow Up Instructions: Counselor will send information for next session via Webex.   The patient was advised to call back or seek an in-person evaluation if the symptoms worsen or if the condition fails to improve as anticipated.  I provided 55 minutes of non-face-to-face time during this encounter.   Lise Auer, LCSW

## 2020-01-29 ENCOUNTER — Other Ambulatory Visit: Payer: Self-pay

## 2020-01-29 ENCOUNTER — Ambulatory Visit: Payer: Medicare Other | Admitting: Physical Therapy

## 2020-01-29 DIAGNOSIS — M62830 Muscle spasm of back: Secondary | ICD-10-CM

## 2020-01-29 DIAGNOSIS — M545 Low back pain, unspecified: Secondary | ICD-10-CM | POA: Diagnosis not present

## 2020-01-29 DIAGNOSIS — M25651 Stiffness of right hip, not elsewhere classified: Secondary | ICD-10-CM

## 2020-01-29 DIAGNOSIS — M6281 Muscle weakness (generalized): Secondary | ICD-10-CM | POA: Diagnosis not present

## 2020-01-29 DIAGNOSIS — R262 Difficulty in walking, not elsewhere classified: Secondary | ICD-10-CM

## 2020-01-29 DIAGNOSIS — G8929 Other chronic pain: Secondary | ICD-10-CM | POA: Diagnosis not present

## 2020-01-29 NOTE — Therapy (Signed)
[place], [place], [address] Phone: [phone]   Fax:  [phone]  Physical Therapy Treatment  Patient Details  Name: Melissa Gaines MRN: [id number] Date of Birth: 1970-10-19 Referring Provider (PT): Eunice Blase, MD   Encounter Date: 01/29/2020   PT End of Session - 01/29/20 1203    Visit Number 2    Number of Visits 16    Date for PT Re-Evaluation 02/04/20    Authorization Type UHC MCR/ MCD    Authorization Time Period FOTO visit 6-7,             [place] visit 16    Progress Note Due on Visit 10    PT Start Time 1145    PT Stop Time 1235    PT Time Calculation (min) 50 min           Past Medical History:  Diagnosis Date  . Anemia   . Anxiety   . Asthma   . Chronic pain   . Congenital hip dislocation   . GERD (gastroesophageal reflux disease)   . Iron deficiency anemia 04/14/2018  . Kidney mass 2017  . Migraine   . Osteoporosis   . Pathological dislocation of shoulder joint, bilateral    congential  . PTSD (post-traumatic stress disorder)   . Sleep apnea   . Systemic lupus erythematosus ([place])     Past Surgical History:  Procedure Laterality Date  . CESAREAN SECTION  2005  . CESAREAN SECTION W/BTL  2010  . HIP SURGERY     in childhood for congenital hip dislocation  . [place] RESECTION  2014  . STAPEDECTOMY  11/01/2011   L postauricular stapedectomy with CO2 laser and insertion of 6 x 4.75 mm SMart Piston  . TOTAL HIP ARTHROPLASTY     05/2000 R, 11/2000 L    There were no vitals filed for this visit.   Subjective Assessment - 01/29/20 1205    Subjective I have a migraine today. hip is 4-5/10 today. My doctor said to try going without the walker.    Currently in Pain? Yes    Pain Score 5     Pain Location Hip    Pain Orientation Right    Pain Descriptors / Indicators Discomfort    Pain Type Chronic pain    Aggravating Factors  sitting and  standing 5 minutes    Pain Relieving Factors lye on stomach                             [facility] Adult PT Treatment/Exercise - 01/29/20 0001      Ambulation/Gait   Ambulation Distance (Feet) 620 Feet    Assistive device None    Ambulation Surface Indoor    Gait Comments 6 minute walk test       Lumbar Exercises: Stretches   Other Lumbar Stretch Exercise seated lumbar flexion      Lumbar Exercises: Aerobic   Nustep L3 UE/LE x 60minutes      Lumbar Exercises: Seated   Other Seated Lumbar Exercises ball queeze x 10x2      Lumbar Exercises: Supine   Ab Set 10 reps    Pelvic Tilt 10 reps    Pelvic Tilt Limitations moderate cues     Clam Limitations bent knee fall outs , single and bilatera with cues for abdominal draw in    Bent Knee Raise 10 reps  Bent Knee Raise Limitations alternating with abdominal                     PT Short Term Goals - 01/15/20 7628      PT SHORT TERM GOAL #1   Title pt will be indpendent with short term HEP    Time 3    Period Weeks    Status New      PT SHORT TERM GOAL #2   Title She will report pain decreased 25% with walking and standing for 10 min    Time 3    Period Weeks    Status New      PT SHORT TERM GOAL #3   Title She will be able to bend 60 degrees before incr pain    Time 3    Period Weeks    Status New      PT SHORT TERM GOAL #4   Title FOTO score improved 8-10 points    Time 3    Period Weeks    Status New             PT Long Term Goals - 01/15/20 0941      PT LONG TERM GOAL #1   Title She will be able to walk with least restricive device out of home for shopping and access to appointments    Time 8    Period Weeks    Status New      PT LONG TERM GOAL #2   Title pt will be able to get into/out of bed & car with min difficulty    Time 8    Period Weeks    Status New      PT LONG TERM GOAL #3   Title She will be able to walk in home with no device with min pain    Time 8     Period Weeks    Status New      PT LONG TERM GOAL #4   Title pt will be independent in long term HEP    Time 8    Period Weeks    Status New      PT LONG TERM GOAL #5   Title FOTO score improved to 35% or better    Time 8    Period Weeks    Status New                 Plan - 01/29/20 1208    Clinical Impression Statement Pt arrives without AD and reports that MD recommended she try ambulating without the RW. 6 minute walk test 65ft without AD. Session focused on initiating beginner HEP for hip mobility. Difficulty and pain reported with right hip march.    PT Next Visit Plan Start HEP for ROM ,   manual for STW,    Modalities PRN    RT SI exerciss    TUG           Patient will benefit from skilled therapeutic intervention in order to improve the following deficits and impairments:  Pain, Postural dysfunction, Decreased strength, Decreased activity tolerance, Decreased range of motion, Difficulty walking, Decreased endurance  Visit Diagnosis: Stiffness of right hip, not elsewhere classified  Difficulty in walking, not elsewhere classified  Muscle weakness (generalized)  Chronic right-sided low back pain without sciatica  Muscle spasm of back     Problem List Patient Active Problem List   Diagnosis Date Noted  . Lumbar back pain 01/25/2020  .  Right hip pain 10/07/2019  . Left hip pain 06/26/2019  . Muscle strain of lower extremity, left, initial encounter 06/26/2019  . Healthcare maintenance 06/26/2019  . Iron deficiency anemia 04/14/2018  . Hematuria, gross 03/29/2018  . Iron deficiency anemia due to chronic blood loss 03/29/2018  . Long term current use of anticoagulant 03/29/2018  . Antiphospholipid antibody syndrome ([facility]) 01/25/2018  . Morbid obesity ([place]) 07/26/2017  . Systemic lupus erythematosus ([place]) 07/08/2017  . Hx pulmonary embolism 07/08/2017  . Chronic pain of right knee 05/23/2017  . Renal mass 01/26/2017  . 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/21/2016   . Vitamin D deficiency 12/16/2016  . Anemia   . Migraine     Dorene Ar, [name] 01/29/2020, 12:46 PM  [facility] [address] [place], [place], [address] Phone: [phone]   Fax:  [phone]  Name: Orlean Holtrop MRN: [id number] Date of Birth: [birth year]

## 2020-02-05 ENCOUNTER — Other Ambulatory Visit: Payer: Self-pay

## 2020-02-05 ENCOUNTER — Encounter (HOSPITAL_COMMUNITY): Payer: Self-pay | Admitting: Psychiatry

## 2020-02-05 ENCOUNTER — Encounter: Payer: Self-pay | Admitting: Physical Therapy

## 2020-02-05 ENCOUNTER — Ambulatory Visit: Payer: Medicare Other | Admitting: Physical Therapy

## 2020-02-05 ENCOUNTER — Telehealth (INDEPENDENT_AMBULATORY_CARE_PROVIDER_SITE_OTHER): Payer: Medicare Other | Admitting: Psychiatry

## 2020-02-05 DIAGNOSIS — F4310 Post-traumatic stress disorder, unspecified: Secondary | ICD-10-CM

## 2020-02-05 DIAGNOSIS — M25651 Stiffness of right hip, not elsewhere classified: Secondary | ICD-10-CM | POA: Diagnosis not present

## 2020-02-05 DIAGNOSIS — M545 Low back pain, unspecified: Secondary | ICD-10-CM

## 2020-02-05 DIAGNOSIS — F331 Major depressive disorder, recurrent, moderate: Secondary | ICD-10-CM

## 2020-02-05 DIAGNOSIS — R262 Difficulty in walking, not elsewhere classified: Secondary | ICD-10-CM

## 2020-02-05 DIAGNOSIS — M6281 Muscle weakness (generalized): Secondary | ICD-10-CM | POA: Diagnosis not present

## 2020-02-05 DIAGNOSIS — M62830 Muscle spasm of back: Secondary | ICD-10-CM | POA: Diagnosis not present

## 2020-02-05 DIAGNOSIS — G8929 Other chronic pain: Secondary | ICD-10-CM | POA: Diagnosis not present

## 2020-02-05 MED ORDER — PAROXETINE HCL 40 MG PO TABS: 40.0000 mg | ORAL_TABLET | Freq: Every day | ORAL | 0 refills | Status: DC

## 2020-02-05 MED ORDER — HYDROXYZINE PAMOATE 50 MG PO CAPS: 50.0000 mg | ORAL_CAPSULE | Freq: Every day | ORAL | 0 refills | Status: DC
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
## 2020-02-05 NOTE — Therapy (Signed)
[place], [place], [address] Phone: [phone]   Fax:  [phone]  Physical Therapy Treatment  Patient Details  Name: Melissa Gaines MRN: [id number] Date of Birth: 1970/08/30 Referring Provider (PT): Eunice Blase, MD   Encounter Date: 02/05/2020   PT End of Session - 02/05/20 1200    Visit Number 3    Number of Visits 16    Date for PT Re-Evaluation 02/04/20    Authorization Type UHC MCR/ MCD    Authorization Time Period FOTO visit 6-7,             [place] visit 16    Progress Note Due on Visit 10    PT Start Time 1151    PT Stop Time 1230    PT Time Calculation (min) 39 min           Past Medical History:  Diagnosis Date  . Anemia   . Anxiety   . Asthma   . Chronic pain   . Congenital hip dislocation   . GERD (gastroesophageal reflux disease)   . Iron deficiency anemia 04/14/2018  . Kidney mass 2017  . Migraine   . Osteoporosis   . Pathological dislocation of shoulder joint, bilateral    congential  . PTSD (post-traumatic stress disorder)   . Sleep apnea   . Systemic lupus erythematosus ([place])     Past Surgical History:  Procedure Laterality Date  . CESAREAN SECTION  2005  . CESAREAN SECTION W/BTL  2010  . HIP SURGERY     in childhood for congenital hip dislocation  . [place] RESECTION  2014  . STAPEDECTOMY  11/01/2011   L postauricular stapedectomy with CO2 laser and insertion of 6 x 4.75 mm SMart Piston  . TOTAL HIP ARTHROPLASTY     05/2000 R, 11/2000 L    There were no vitals filed for this visit.   Subjective Assessment - 02/05/20 1156    Subjective Patient reports she has pain in her hip today, "but not severe pain." Patient reports migraine currently. Increased pain with sitting and standing in her back.    Currently in Pain? Yes    Pain Score 4     Pain Location Hip    Pain Orientation Right    Pain Descriptors / Indicators Discomfort     Pain Type Chronic pain    Aggravating Factors  sitting and standing    Pain Relieving Factors lying on stomach                             [facility] Adult PT Treatment/Exercise - 02/05/20 0001      Ambulation/Gait   Ambulation Distance (Feet) 620 Feet    Assistive device None    Gait Comments 6 minute walk test       Lumbar Exercises: Stretches   Other Lumbar Stretch Exercise seated lumbar flexion      Lumbar Exercises: Aerobic   Nustep L3 UE/LE x 65minutes      Lumbar Exercises: Standing   Heel Raises 20 reps    Functional Squats 20 reps    Functional Squats Limitations at free motion bar     Other Standing Lumbar Exercises 3 way hip x 20 each, step ups 4 inch x 10 each at free motion bar      Lumbar Exercises: Supine   Ab Set 10 reps    Pelvic Tilt 10  reps    Pelvic Tilt Limitations moderate cues     Clam 15 reps    Clam Limitations bent knee fall outs , single and bilatera with cues for abdominal draw in    Bent Knee Raise 20 reps    Bent Knee Raise Limitations alternating with abdominal     Bridge with [employer] 20 reps    Bridge with [employer] Limitations partial range, breathing cues    Straight Leg Raise 10 reps    Straight Leg Raises Limitations LLE only    Other Supine Lumbar Exercises ball squeeze 2 x 10                     PT Short Term Goals - 01/15/20 1610      PT SHORT TERM GOAL #1   Title pt will be indpendent with short term HEP    Time 3    Period Weeks    Status New      PT SHORT TERM GOAL #2   Title She will report pain decreased 25% with walking and standing for 10 min    Time 3    Period Weeks    Status New      PT SHORT TERM GOAL #3   Title She will be able to bend 60 degrees before incr pain    Time 3    Period Weeks    Status New      PT SHORT TERM GOAL #4   Title FOTO score improved 8-10 points    Time 3    Period Weeks    Status New             PT Long Term Goals - 01/15/20 0941      PT  LONG TERM GOAL #1   Title She will be able to walk with least restricive device out of home for shopping and access to appointments    Time 8    Period Weeks    Status New      PT LONG TERM GOAL #2   Title pt will be able to get into/out of bed & car with min difficulty    Time 8    Period Weeks    Status New      PT LONG TERM GOAL #3   Title She will be able to walk in home with no device with min pain    Time 8    Period Weeks    Status New      PT LONG TERM GOAL #4   Title pt will be independent in long term HEP    Time 8    Period Weeks    Status New      PT LONG TERM GOAL #5   Title FOTO score improved to 35% or better    Time 8    Period Weeks    Status New                 Plan - 02/05/20 1237    Clinical Impression Statement Pt reports it is a good day with average pain levels and migraine headache. She reports consistency with HEP inlcuding some exercises from previous episode. Began more closed chian today and she tolerated well. She does have difficulty lifting RLE onto Nustep and onto mat and needs UE asssit. She reports walking 5-10 minutes 2 x a day for dog walking.    PT Next Visit Plan Start HEP for ROM ,  manual for STW,    Modalities PRN    RT SI exerciss    TUG, gait           Patient will benefit from skilled therapeutic intervention in order to improve the following deficits and impairments:  Pain, Postural dysfunction, Decreased strength, Decreased activity tolerance, Decreased range of motion, Difficulty walking, Decreased endurance  Visit Diagnosis: Stiffness of right hip, not elsewhere classified  Muscle weakness (generalized)  Difficulty in walking, not elsewhere classified  Chronic right-sided low back pain without sciatica  Muscle spasm of back     Problem List Patient Active Problem List   Diagnosis Date Noted  . Lumbar back pain 01/25/2020  . Right hip pain 10/07/2019  . Left hip pain 06/26/2019  . Muscle strain of  lower extremity, left, initial encounter 06/26/2019  . Healthcare maintenance 06/26/2019  . Iron deficiency anemia 04/14/2018  . Hematuria, gross 03/29/2018  . Iron deficiency anemia due to chronic blood loss 03/29/2018  . Long term current use of anticoagulant 03/29/2018  . Antiphospholipid antibody syndrome ([place]) 01/25/2018  . Morbid obesity ([place]) 07/26/2017  . Systemic lupus erythematosus ([place]) 07/08/2017  . Hx pulmonary embolism 07/08/2017  . Chronic pain of right knee 05/23/2017  . Renal mass 01/26/2017  . 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/21/2016  . Vitamin D deficiency 12/16/2016  . Anemia   . Migraine     Dorene Ar, [name] 02/05/2020, 12:41 PM  [facility] [address] [place], [place], [address] Phone: [phone]   Fax:  [phone]  Name: Kriste Broman MRN: [id number] Date of Birth: 1970-08-02

## 2020-02-07 ENCOUNTER — Ambulatory Visit: Payer: Medicaid Other

## 2020-02-11 ENCOUNTER — Inpatient Hospital Stay: Payer: Medicare Other

## 2020-02-11 ENCOUNTER — Ambulatory Visit: Payer: Medicare HMO

## 2020-02-12 ENCOUNTER — Other Ambulatory Visit: Payer: Self-pay

## 2020-02-12 ENCOUNTER — Ambulatory Visit: Payer: Medicaid Other | Attending: Family Medicine

## 2020-02-12 DIAGNOSIS — M25652 Stiffness of left hip, not elsewhere classified: Secondary | ICD-10-CM | POA: Diagnosis present

## 2020-02-12 DIAGNOSIS — M545 Low back pain, unspecified: Secondary | ICD-10-CM | POA: Insufficient documentation

## 2020-02-12 DIAGNOSIS — R262 Difficulty in walking, not elsewhere classified: Secondary | ICD-10-CM | POA: Insufficient documentation

## 2020-02-12 DIAGNOSIS — M25651 Stiffness of right hip, not elsewhere classified: Secondary | ICD-10-CM | POA: Insufficient documentation

## 2020-02-12 DIAGNOSIS — M62830 Muscle spasm of back: Secondary | ICD-10-CM | POA: Diagnosis present

## 2020-02-12 DIAGNOSIS — G8929 Other chronic pain: Secondary | ICD-10-CM | POA: Diagnosis present

## 2020-02-12 DIAGNOSIS — M25552 Pain in left hip: Secondary | ICD-10-CM | POA: Insufficient documentation

## 2020-02-12 DIAGNOSIS — M6281 Muscle weakness (generalized): Secondary | ICD-10-CM | POA: Diagnosis present

## 2020-02-12 NOTE — Patient Instructions (Signed)
Bridges , clams and marching. SLR  X 10-20 reps  Daily RT and LT

## 2020-02-12 NOTE — Therapy (Signed)
[place], [place], [address] Phone: [phone]   Fax:  [phone]  Physical Therapy Treatment  Patient Details  Name: Melissa Gaines MRN: [id number] Date of Birth: [birth year] Referring Provider (PT): Eunice Blase, MD   Encounter Date: 02/12/2020   PT End of Session - 02/12/20 1048    Visit Number 4    Number of Visits 16    Authorization Type UHC MCR/ MCD    PT Start Time 8546    PT Stop Time 1230    PT Time Calculation (min) 43 min    Activity Tolerance Patient limited by pain    Behavior During Therapy [facility] for tasks assessed/performed           Past Medical History:  Diagnosis Date  . Anemia   . Anxiety   . Asthma   . Chronic pain   . Congenital hip dislocation   . GERD (gastroesophageal reflux disease)   . Iron deficiency anemia 04/14/2018  . Kidney mass 2017  . Migraine   . Osteoporosis   . Pathological dislocation of shoulder joint, bilateral    congential  . PTSD (post-traumatic stress disorder)   . Sleep apnea   . Systemic lupus erythematosus ([place])     Past Surgical History:  Procedure Laterality Date  . CESAREAN SECTION  2005  . CESAREAN SECTION W/BTL  2010  . HIP SURGERY     in childhood for congenital hip dislocation  . [place] RESECTION  2014  . STAPEDECTOMY  11/01/2011   L postauricular stapedectomy with CO2 laser and insertion of 6 x 4.75 mm SMart Piston  . TOTAL HIP ARTHROPLASTY     05/2000 R, 11/2000 L    There were no vitals filed for this visit.   Subjective Assessment - 02/12/20 1050    Subjective She reports she is a little better . She reports no pain but some discomfort in her hips    Currently in Pain? No/denies                             [facility] Adult PT Treatment/Exercise - 02/12/20 0001      Lumbar Exercises: Aerobic   Nustep L4 LE only 6 min short ROM      Lumbar Exercises: Standing   Heel  Raises 20 reps;5 seconds    Functional Squats 20 reps    Functional Squats Limitations at free motion bar     Other Standing Lumbar Exercises 3 way hip x 20 each, step ups 4 inch x 10 each at free motion bar      Lumbar Exercises: Supine   Pelvic Tilt 10 reps;5 seconds    Pelvic Tilt Limitations moderate cues     Clam 20 reps    Clam Limitations green band  with short hip lift    Bent Knee Raise 20 reps    Bent Knee Raise Limitations alternating with abdominal     Bridge with [employer] 20 reps    Bridge with [employer] Limitations partial range, breathing cues                    PT Short Term Goals - 01/15/20 0922      PT SHORT TERM GOAL #1   Title pt will be indpendent with short term HEP    Time 3    Period Weeks    Status New  PT SHORT TERM GOAL #2   Title She will report pain decreased 25% with walking and standing for 10 min    Time 3    Period Weeks    Status New      PT SHORT TERM GOAL #3   Title She will be able to bend 60 degrees before incr pain    Time 3    Period Weeks    Status New      PT SHORT TERM GOAL #4   Title FOTO score improved 8-10 points    Time 3    Period Weeks    Status New             PT Long Term Goals - 01/15/20 0941      PT LONG TERM GOAL #1   Title She will be able to walk with least restricive device out of home for shopping and access to appointments    Time 8    Period Weeks    Status New      PT LONG TERM GOAL #2   Title pt will be able to get into/out of bed & car with min difficulty    Time 8    Period Weeks    Status New      PT LONG TERM GOAL #3   Title She will be able to walk in home with no device with min pain    Time 8    Period Weeks    Status New      PT LONG TERM GOAL #4   Title pt will be independent in long term HEP    Time 8    Period Weeks    Status New      PT LONG TERM GOAL #5   Title FOTO score improved to 35% or better    Time 8    Period Weeks    Status New                  Plan - 02/12/20 1054    Clinical Impression Statement She is doing better with less pain nad able to do al l exercises with only minor pain and not constantly.   Encouraged her that as shestrethgtened she should consistently have little to no pain.    PT Treatment/Interventions Dry needling;Passive range of motion;Patient/family education;Iontophoresis 4mg /ml Dexamethasone;Electrical Stimulation;Ultrasound;Moist Heat;Manual techniques    PT Next Visit Plan Cont HEP , strength ROm ,  modalities if needed    PT Home Exercise Plan SLE , bridge, clams, ball squeeze    Consulted and Agree with Plan of Care Patient           Patient will benefit from skilled therapeutic intervention in order to improve the following deficits and impairments:  Pain, Postural dysfunction, Decreased strength, Decreased activity tolerance, Decreased range of motion, Difficulty walking, Decreased endurance  Visit Diagnosis: Stiffness of right hip, not elsewhere classified  Muscle weakness (generalized)  Difficulty in walking, not elsewhere classified  Chronic right-sided low back pain without sciatica  Muscle spasm of back     Problem List Patient Active Problem List   Diagnosis Date Noted  . Lumbar back pain 01/25/2020  . Right hip pain 10/07/2019  . Left hip pain 06/26/2019  . Muscle strain of lower extremity, left, initial encounter 06/26/2019  . Healthcare maintenance 06/26/2019  . Iron deficiency anemia 04/14/2018  . Hematuria, gross 03/29/2018  . Iron deficiency anemia due to chronic blood loss 03/29/2018  . Long  term current use of anticoagulant 03/29/2018  . Antiphospholipid antibody syndrome ([place]) 01/25/2018  . Morbid obesity ([place]) 07/26/2017  . Systemic lupus erythematosus ([place]) 07/08/2017  . Hx pulmonary embolism 07/08/2017  . Chronic pain of right knee 05/23/2017  . Renal mass 01/26/2017  . 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/21/2016  . Vitamin D deficiency 12/16/2016  . Anemia    . Migraine     Darrel Hoover  PT 02/12/2020, 11:33 AM  [facility] [address] [place], [place], [address] Phone: [phone]   Fax:  [phone]  Name: Melissa Gaines MRN: [id number] Date of Birth: [birth year]

## 2020-02-14 ENCOUNTER — Encounter: Payer: Self-pay | Admitting: Neurology

## 2020-02-14 ENCOUNTER — Other Ambulatory Visit: Payer: Self-pay

## 2020-02-14 ENCOUNTER — Ambulatory Visit: Payer: Medicaid Other

## 2020-02-14 DIAGNOSIS — M25651 Stiffness of right hip, not elsewhere classified: Secondary | ICD-10-CM | POA: Diagnosis not present

## 2020-02-14 DIAGNOSIS — M6281 Muscle weakness (generalized): Secondary | ICD-10-CM

## 2020-02-14 DIAGNOSIS — R262 Difficulty in walking, not elsewhere classified: Secondary | ICD-10-CM

## 2020-02-14 DIAGNOSIS — G8929 Other chronic pain: Secondary | ICD-10-CM

## 2020-02-14 DIAGNOSIS — M25652 Stiffness of left hip, not elsewhere classified: Secondary | ICD-10-CM

## 2020-02-14 DIAGNOSIS — M545 Low back pain, unspecified: Secondary | ICD-10-CM

## 2020-02-14 DIAGNOSIS — M25552 Pain in left hip: Secondary | ICD-10-CM

## 2020-02-14 DIAGNOSIS — M62830 Muscle spasm of back: Secondary | ICD-10-CM

## 2020-02-14 NOTE — Therapy (Signed)
[place], [place], [address] Phone: [phone]   Fax:  [phone]  Physical Therapy Treatment  Patient Details  Name: Melissa Gaines MRN: [id number] Date of Birth: 1970-08-22 Referring Provider (PT): Eunice Blase, MD   Encounter Date: 02/14/2020   PT End of Session - 02/14/20 1057    Visit Number 5    Number of Visits 16    Date for PT Re-Evaluation 02/04/20    Authorization Type UHC MCR/ MCD    Authorization Time Period FOTO visit 6-7,             [place] visit 16    Progress Note Due on Visit 10    PT Start Time 1030    PT Stop Time 1115    PT Time Calculation (min) 45 min    Activity Tolerance Patient tolerated treatment well    Behavior During Therapy [facility] for tasks assessed/performed           Past Medical History:  Diagnosis Date  . Anemia   . Anxiety   . Asthma   . Chronic pain   . Congenital hip dislocation   . GERD (gastroesophageal reflux disease)   . Iron deficiency anemia 04/14/2018  . Kidney mass 2017  . Migraine   . Osteoporosis   . Pathological dislocation of shoulder joint, bilateral    congential  . PTSD (post-traumatic stress disorder)   . Sleep apnea   . Systemic lupus erythematosus ([place])     Past Surgical History:  Procedure Laterality Date  . CESAREAN SECTION  2005  . CESAREAN SECTION W/BTL  2010  . HIP SURGERY     in childhood for congenital hip dislocation  . [place] RESECTION  2014  . STAPEDECTOMY  11/01/2011   L postauricular stapedectomy with CO2 laser and insertion of 6 x 4.75 mm SMart Piston  . TOTAL HIP ARTHROPLASTY     05/2000 R, 11/2000 L    There were no vitals filed for this visit.   Subjective Assessment - 02/14/20 1117    Subjective She reports doing better . pain less and able to do HEP without problem . Reports life long RT hip weakness and ># surgeries to the RT hip                              [facility] Adult PT Treatment/Exercise - 02/14/20 0001      Lumbar Exercises: Aerobic   Nustep L4 LE only 15  min short ROM      Lumbar Exercises: Standing   Heel Raises 20 reps;5 seconds    Functional Squats 20 reps    Functional Squats Limitations at free motion bar     Other Standing Lumbar Exercises 3 way hip x 20 each yellow band, step ups 4 inch x 10 each at free motion bar      Lumbar Exercises: Seated   Sit to Stand 20 reps      Lumbar Exercises: Supine   Pelvic Tilt 10 reps;5 seconds    Clam 20 reps    Clam Limitations green band  with short hip lift    Bent Knee Raise 20 reps    Bridge with [employer] 20 reps    Bridge with [employer] Limitations partial range, breathing cues    Straight Leg Raise 10 reps    Other Supine Lumbar Exercises LTR  legs on red  thera ball x 15 RT/LT, then bilateral hamstring rolling ball toward hips.      Lumbar Exercises: Sidelying   Clam Right;Left;20 reps    Hip Abduction Right;Left;20 reps                  PT Education - 02/14/20 1111    Education Details With exercise no sharp  or intense pain . Some soreness will be ok. Ease reps at home if overly sore or tired.  She pushes herself often doing more reps than asked. will continue to work her hard as tolerated.    Person(s) Educated Patient    Methods Explanation    Comprehension Verbalized understanding            PT Short Term Goals - 02/14/20 1115      PT SHORT TERM GOAL #1   Title pt will be indpendent with short term HEP    Status Achieved      PT SHORT TERM GOAL #2   Title She will report pain decreased 25% with walking and standing for 10 min    Baseline She reports improvement  but maybe not 25%    Status Partially Met             PT Long Term Goals - 01/15/20 0941      PT LONG TERM GOAL #1   Title She will be able to walk with least restricive device out of home for shopping and access to appointments    Time 8     Period Weeks    Status New      PT LONG TERM GOAL #2   Title pt will be able to get into/out of bed & car with min difficulty    Time 8    Period Weeks    Status New      PT LONG TERM GOAL #3   Title She will be able to walk in home with no device with min pain    Time 8    Period Weeks    Status New      PT LONG TERM GOAL #4   Title pt will be independent in long term HEP    Time 8    Period Weeks    Status New      PT LONG TERM GOAL #5   Title FOTO score improved to 35% or better    Time 8    Period Weeks    Status New                 Plan - 02/14/20 1019    Clinical Impression Statement RT hip flexion,  ER and abduction very weak.  With exercise no sharp  or intense pain . Some soreness will be ok. Ease reps at home if overly sore or tired.  She pushes herself often doing more reps than asked. will continue to work her hard as tolerated.    PT Treatment/Interventions Dry needling;Passive range of motion;Patient/family education;Iontophoresis 22m/ml Dexamethasone;Electrical Stimulation;Ultrasound;Moist Heat;Manual techniques    PT Next Visit Plan Cont HEP  add sidelye  exercise to HEP  add standing  exer to HEP , strength ROm ,  modalities if needed                  FOTO    PT Home Exercise Plan SLE , bridge, clams, ball squeeze    Consulted and Agree with Plan of Care Patient  Patient will benefit from skilled therapeutic intervention in order to improve the following deficits and impairments:  Pain,Postural dysfunction,Decreased strength,Decreased activity tolerance,Decreased range of motion,Difficulty walking,Decreased endurance  Visit Diagnosis: Muscle weakness (generalized)  Stiffness of right hip, not elsewhere classified  Difficulty in walking, not elsewhere classified  Chronic right-sided low back pain without sciatica  Pain in left hip  Muscle spasm of back  Stiffness of left hip, not elsewhere classified     Problem  List Patient Active Problem List   Diagnosis Date Noted  . Lumbar back pain 01/25/2020  . Right hip pain 10/07/2019  . Left hip pain 06/26/2019  . Muscle strain of lower extremity, left, initial encounter 06/26/2019  . Healthcare maintenance 06/26/2019  . Iron deficiency anemia 04/14/2018  . Hematuria, gross 03/29/2018  . Iron deficiency anemia due to chronic blood loss 03/29/2018  . Long term current use of anticoagulant 03/29/2018  . Antiphospholipid antibody syndrome ([place]) 01/25/2018  . Morbid obesity ([place]) 07/26/2017  . Systemic lupus erythematosus ([place]) 07/08/2017  . Hx pulmonary embolism 07/08/2017  . Chronic pain of right knee 05/23/2017  . Renal mass 01/26/2017  . 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/21/2016  . Vitamin D deficiency 12/16/2016  . Anemia   . Migraine     Darrel Hoover  PT 02/14/2020, 11:18 AM  [facility] [name] [place], [place], [address] Phone: [phone]   Fax:  [phone]  Name: Yelina Sarratt MRN: [id number] Date of Birth: [birth year]

## 2020-02-14 NOTE — Progress Notes (Signed)
Melissa Gaines (Key: [id number]) Aimovig 70MG /ML auto-injectors   Form [employer] Medicare Part D Electronic Prior Authorization Form (2017 NCPDP) Created 3 hours ago Sent to Plan 16 minutes ago Plan Response 15 minutes ago Submit Clinical Questions 14 minutes ago Determination Favorable 11 minutes ago Message from Plan Request Reference Number: [id number]. AIMOVIG INJ 70MG /ML is approved through 03/07/2021. Your patient may now fill this prescription and it will be covered.

## 2020-02-18 ENCOUNTER — Encounter (HOSPITAL_COMMUNITY): Payer: Self-pay | Admitting: Psychiatry

## 2020-02-18 ENCOUNTER — Ambulatory Visit (INDEPENDENT_AMBULATORY_CARE_PROVIDER_SITE_OTHER): Payer: Medicare Other | Admitting: Psychiatry

## 2020-02-18 ENCOUNTER — Other Ambulatory Visit: Payer: Self-pay

## 2020-02-18 DIAGNOSIS — F4310 Post-traumatic stress disorder, unspecified: Secondary | ICD-10-CM | POA: Diagnosis not present

## 2020-02-18 DIAGNOSIS — F331 Major depressive disorder, recurrent, moderate: Secondary | ICD-10-CM

## 2020-02-18 NOTE — Progress Notes (Signed)
Virtual Visit via Telephone Note  I connected with Melissa Gaines on 02/18/20 at  2:30 PM EST by telephone and verified that I am speaking with the correct person using two identifiers.  Location: Patient: Patient Home Provider: Home Office  I discussed the limitations of evaluation and management by telemedicine and the availability of in person appointments. The patient expressed understanding and agreed to proceed.  History of Present Illness: PTSD  Treatment Plan Goals: 1)REVISED 02/18/20: To reduce stress by improving effectiveness of parenting skills and strategies through consistent and proper application and to process trauma related to her parent-child experiences growing up.   2)To decrease depressive symptoms by addressing pain management with providers, by applying CBT skills and through processing thoughts and feelings in session.  Observations/Objective: Counselor met with Client for individual therapy via Webex. Counselor assessed MH symptoms and progress on treatment plan goals, with patient reportingthat she has been feeling "down more" due to the holiday season and how it has changed for her over the years regarding her circumstances. Client presents withmoderatedepression and mildanxiety. Client denied suicidal ideation or self-harm behaviors.   Goal 1: Counselor and Client updated goal on treatment plan to add the impact and need to explore childhood traumas and factors related to her style and function as a parent. Counselor processed current challenges and discussed sadness around challenges with providing the same holiday experiences for her children as she was provided by her foster mother. Client shared memories and triggered connected with current circumstances. Counselor used TFCB interventions to process emotions and regulate Client as she verbalized thoughts.  Goal 2: Counselor assessed pain levels and impact on functioning. Client  reports that she is experiencing moderate pain levels. Client is concerned about ability to get around, stand for long periods and how pain impacts motivation to do other things, like cooking, cleaning, and leaving the home. Counselor recommended that client follow up with providers for pain management. Counselor processed impact on self-esteem and self-concept, offering CBT interventions for Client to practice. Counselor recommends Client discuss with providers to order electronic wheel chair for better mobility and accessibility. Client hesitant, as she does not view herself as someone in need of a wheelchair. Counselor used cognitive coping strategies to process feelings and thoughts. Client to report back outcome of upcoming doctors appointment at next session.   Assessment and Plan: Counselor will continue to meet with patient to address treatment plan goals. Patient will continue to follow recommendations of providers and implement skills learned in session.  Follow Up Instructions: Counselor will send information for next session via Webex.   The patient was advised to call back or seek an in-person evaluation if the symptoms worsen or if the condition fails to improve as anticipated.  I provided 45 minutes of non-face-to-face time during this encounter.   Lise Auer, LCSW

## 2020-02-19 ENCOUNTER — Other Ambulatory Visit: Payer: Self-pay

## 2020-02-19 ENCOUNTER — Ambulatory Visit: Payer: Medicaid Other

## 2020-02-19 DIAGNOSIS — M6281 Muscle weakness (generalized): Secondary | ICD-10-CM

## 2020-02-19 DIAGNOSIS — R262 Difficulty in walking, not elsewhere classified: Secondary | ICD-10-CM

## 2020-02-19 DIAGNOSIS — M545 Low back pain, unspecified: Secondary | ICD-10-CM

## 2020-02-19 DIAGNOSIS — G8929 Other chronic pain: Secondary | ICD-10-CM

## 2020-02-19 DIAGNOSIS — M25651 Stiffness of right hip, not elsewhere classified: Secondary | ICD-10-CM | POA: Diagnosis not present

## 2020-02-19 NOTE — Progress Notes (Signed)
NEUROLOGY FOLLOW UP OFFICE NOTE  Melissa Gaines [id number]   Subjective:  Melissa Gaines is a 49year old woman with PTSD, anxiety, chronic dizziness, migraines, SLE andantiphospholipid antibody syndrome withhistory of PE on chronic anticoagulation whofollows up for migraines.  UPDATE: She has a daily dull headache.  Regarding migraines Intensity:severe Duration: 2-3 hours with sumatriptan.  With Ubrelvy, lasted 60 to 90 minutes.   Frequency: 4 to 5 days a month.  Current NSAIDS:Tylenol, naproxen (most days a week) Current analgesics:None Current triptans:sumatriptan 100mg  Current ergotamine:none Current anti-emetic:none Current muscle relaxants:none Current anti-anxiolytic:hydroxyzine Current sleep aide:none Current Antihypertensive medications:none Current Antidepressant medications:Paxil 40mg  Current Anticonvulsant medications:gabapentin 300mg  QHS Current anti-CGRP:Aimovig 70mg , Ubrelvy 100mg  Current Vitamins/Herbal/Supplements:D, folic acid, Y63 Current Antihistamines/Decongestants:none Other therapy:Icy-cold Hormone/birth control:none Other medications:Eliquis, methotrexate  Caffeine:No coffee. Pepsi daily. Alcohol:no Smoker:no Diet:4 glasses of water daily. Skips meals Exercise:no Depression:yes; Anxiety:yes Other pain:Joint pain, back pain, lumbar radicular pain Sleep hygiene:varies  HISTORY: Onset:Mild migraines in1990sbut worse since birth of son in 2010 Location:Left temple radiating down to back of head Quality:pounding, shocks InitialIntensity:Severe. Shedenies new headache, thunderclap headache  Aura:no Premonitory Phase:no Postdrome:no Associated symptoms:Photophobia, phonophobia, dizziness,blurred vision,stuttering speech.If severe, she has memory deficits such as difficulty spelling her name or remembering her social security number.  Shedenies associated nausea, vomiting, unilateral numbness or weakness. InitialDuration:Varies. 1 hour to several hours. Last month, she had one that lasted 5 days. InitialFrequency:Daily InitialFrequency of abortive medication:ASA and Tylenol daily Triggers: Unknown Relieving factors: Restingin dark quiet room, cold packs Activity:aggravates  She reports increased electric shock pain in the left temple radiating to back of head, lasting 1 to hours with photophobia and phonophobia, occurring twice a month.  Past NSAIDS:ibuprofen, ASA 325mg  Past analgesics:Extra-strength Tylenol Past abortive triptans:rizatriptan Past abortive ergotamine:none Past muscle relaxants:none Past anti-emetic:none Past antihypertensive medications:none Past antidepressant medications:none Past anticonvulsant medications:topiramate 100mg  Past anti-CGRP:none Past vitamins/Herbal/Supplements:none Past antihistamines/decongestants:none Other past therapies:none  Family history of headache:Mom  Of note, she was told she had a 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 However, she followed up with another neurologist in [place] who performed a repeat brain MRI and was told that it was gone.  PAST MEDICAL HISTORY: Past Medical History:  Diagnosis Date   Anemia    Anxiety    Asthma    Chronic pain    Congenital hip dislocation    GERD (gastroesophageal reflux disease)    Iron deficiency anemia 04/14/2018   Kidney mass 2017   Migraine    Osteoporosis    Pathological dislocation of shoulder joint, bilateral    congential   PTSD (post-traumatic stress disorder)    Sleep apnea    Systemic lupus erythematosus ([place])     MEDICATIONS: Current Outpatient Medications on File Prior to Visit  Medication Sig Dispense Refill   acetaminophen (TYLENOL) 500 MG tablet Take 500 mg by mouth every 6 (six) hours as needed for headache.      albuterol (PROVENTIL) (2.5 MG/3ML) 0.083% nebulizer solution Take 3 mLs (2.5 mg total) by nebulization every 6 (six) hours as needed for wheezing or shortness of breath. 150 mL 1   Albuterol Sulfate (PROAIR RESPICLICK) 785 (90 Base) MCG/ACT AEPB Inhale 1 puff into the lungs every 4 (four) hours as needed (wheezing, coughing). 1 each 3   apixaban (ELIQUIS) 5 MG TABS tablet Take 5 mg by mouth 2 (two) times daily.     cyanocobalamin (,VITAMIN B-12,) 1000 MCG/ML injection Inject 1 mL (1,000 mcg total) into the skin  every 30 (thirty) days. 5 mL 0   cycloSPORINE (RESTASIS) 0.05 % ophthalmic emulsion Place 1 drop into both eyes 2 (two) times daily. 0.4 mL 0   Erenumab-aooe (AIMOVIG) 70 MG/ML SOAJ Inject 70 mg into the skin every 30 (thirty) days. 1 mL 3   ferrous sulfate 325 (65 FE) MG tablet Take 325 mg by mouth daily with breakfast.     gabapentin (NEURONTIN) 100 MG capsule TAKE 3 CAPSULE EVERY NIGHT AT BEDTIME 90 capsule 3   hydrOXYzine (VISTARIL) 50 MG capsule Take 1 capsule (50 mg total) by mouth at bedtime. 90 capsule 0   naproxen (NAPROSYN) 500 MG tablet Take 500 mg by mouth 2 (two) times daily.     omeprazole (PRILOSEC) 40 MG capsule Take 1 capsule (40 mg total) by mouth daily. 30 capsule 2   PARoxetine (PAXIL) 40 MG tablet Take 1 tablet (40 mg total) by mouth daily. 90 tablet 0   SYRINGE/NEEDLE, DISP, 1 ML 25G X 5/8" 1 ML MISC Match with B12 3 each 3   Vitamin D, Ergocalciferol, (DRISDOL) 50000 units CAPS capsule Take 1 capsule (50,000 Units total) by mouth every 7 (seven) days. 8 capsule 0   No current facility-administered medications on file prior to visit.    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 Throat closes and short of breath.    FAMILY HISTORY: Family History  Problem Relation Age of Onset   Asthma Mother    Liver cancer Mother    Heart Problems Mother        heart attack and heart failure   Kidney  Stones Mother    Osteoporosis Mother    Stroke Mother    Depression Mother    Anxiety disorder Mother    Kidney Stones Father    Hernia Father    Kidney disease Father    Depression Sister    Anxiety disorder Sister    Asthma Sister    Cancer Maternal Grandmother    Cancer Paternal Grandmother    Cancer Other     SOCIAL HISTORY: Social History   Socioeconomic History   Marital status: Single    Spouse name: Not on file   Number of children: 2   Years of education: some high school   Highest education level: 11th grade  Occupational History   Occupation: Disabled   Tobacco Use   Smoking status: Never Smoker   Smokeless tobacco: Never Used  Scientific laboratory technician Use: Never used  Substance and Sexual Activity   Alcohol use: Not Currently    Comment: when she was 49 years old; none after having children    Drug use: No   Sexual activity: Not Currently    Partners: Male    Birth control/protection: Condom, Surgical  Other Topics Concern   Not on file  Social History Narrative   Lives with her 2 children and dog here in [place].   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    She likes to spend time with her children and write poems.   Patient lives in one story home, Right handed    Social Determinants of Health   Financial Resource Strain: Not on file  Food Insecurity: Not on file  Transportation Needs: Unmet Transportation Needs   Lack of Transportation (Medical): No   Lack of Transportation (Non-Medical): Yes  Physical Activity: Not on file  Stress: Not on file  Social Connections: Not on file  Intimate Partner Violence: Not on file     Objective:  Blood pressure 116/85, pulse 93, height 4\' 10"  (1.473 m), weight 219 lb 12.8 oz (99.7 kg), SpO2 96 %. General: No acute distress.  Patient appears well-groomed.     Assessment/Plan:   1.  Migraine without aura, without status migrainosus, not intractable 2.   Left sided occipital neuralgia/cervical radiculopathy 3.  Questionable history of brain tumor.  1.  Migraine prevention:  Increase Aimovig to 140mg  every 30 days 2.  Migraine rescue:  Will try to get Ubrelvy 100mg  approved as she likes it better than sumatriptan.  Otherwise, continue sumatriptan. 3.  Limit use of pain relievers to no more than 2 days out of week to prevent risk of rebound or medication-overuse headache. 4.  Keep headache diary 5.  Unclear history of brain tumor.  Will check MRI of brain with and without contrast 6.  Follow up 6 months.  Metta Clines, DO  CC: [name], MD

## 2020-02-19 NOTE — Therapy (Signed)
[place], [place], [address] Phone: [phone]   Fax:  [phone]  Physical Therapy Treatment  Patient Details  Name: Melissa Gaines MRN: [id number] Date of Birth: 1970/06/28 Referring Provider (PT): Eunice Blase, MD   Encounter Date: 02/19/2020   PT End of Session - 02/19/20 1049    Visit Number 6    Number of Visits 16    Date for PT Re-Evaluation 03/21/20    Authorization Type UHC MCR/ MCD    Authorization Time Period FOTO visit 6-7,             [place] visit 16    Progress Note Due on Visit 10    PT Start Time 1045    PT Stop Time 1125    PT Time Calculation (min) 40 min    Activity Tolerance Patient tolerated treatment well    Behavior During Therapy [facility] for tasks assessed/performed           Past Medical History:  Diagnosis Date   Anemia    Anxiety    Asthma    Chronic pain    Congenital hip dislocation    GERD (gastroesophageal reflux disease)    Iron deficiency anemia 04/14/2018   Kidney mass 2017   Migraine    Osteoporosis    Pathological dislocation of shoulder joint, bilateral    congential   PTSD (post-traumatic stress disorder)    Sleep apnea    Systemic lupus erythematosus ([place])     Past Surgical History:  Procedure Laterality Date   CESAREAN SECTION  2005   CESAREAN SECTION W/BTL  2010   HIP SURGERY     in childhood for congenital hip dislocation   LAPAROSCOPIC GASTRIC SLEEVE RESECTION  2014   STAPEDECTOMY  11/01/2011   L postauricular stapedectomy with CO2 laser and insertion of 6 x 4.75 mm SMart Piston   TOTAL HIP ARTHROPLASTY     05/2000 R, 11/2000 L    There were no vitals filed for this visit.   Subjective Assessment - 02/19/20 1059    Subjective Soreness. Cold weather started and some pain come on.  Doing alright.                             [place] Adult PT Treatment/Exercise - 02/19/20 0001      Lumbar  Exercises: Aerobic   Nustep L6 LE only 6  min short ROM      Lumbar Exercises: Standing   Heel Raises 20 reps    Functional Squats Limitations at free motion bar  25reps    Other Standing Lumbar Exercises 3 way hip x 20 each yellow band, step ups 4 inch x 15 each at free motion bar      Lumbar Exercises: Seated   Sit to Stand Limitations 25 reps      Lumbar Exercises: Supine   Clam 20 reps    Clam Limitations green band  with short hip lift    Bent Knee Raise 20 reps    Bridge with [employer] 20 reps    Bridge with [employer] Limitations partial range, breathing cues    Other Supine Lumbar Exercises LTR  legs on red thera ball x 20 RT/LT, then bilateral hamstring rolling ball toward hips.      Lumbar Exercises: Sidelying   Clam Right;Left;20 reps    Hip Abduction Right;Left;20 reps      Lumbar  Exercises: Prone   Single Arm Raise --    Straight Leg Raise 15 reps    Straight Leg Raises Limitations rt/lt    Other Prone Lumbar Exercises Knee flexion x 20 RT/LT                    PT Short Term Goals - 02/14/20 1115      PT SHORT TERM GOAL #1   Title pt will be indpendent with short term HEP    Status Achieved      PT SHORT TERM GOAL #2   Title She will report pain decreased 25% with walking and standing for 10 min    Baseline She reports improvement  but maybe not 25%    Status Partially Met             PT Long Term Goals - 01/15/20 0941      PT LONG TERM GOAL #1   Title She will be able to walk with least restricive device out of home for shopping and access to appointments    Time 8    Period Weeks    Status New      PT LONG TERM GOAL #2   Title pt will be able to get into/out of bed & car with min difficulty    Time 8    Period Weeks    Status New      PT LONG TERM GOAL #3   Title She will be able to walk in home with no device with min pain    Time 8    Period Weeks    Status New      PT LONG TERM GOAL #4   Title pt will be independent  in long term HEP    Time 8    Period Weeks    Status New      PT LONG TERM GOAL #5   Title FOTO score improved to 35% or better    Time 8    Period Weeks    Status New                 Plan - 02/19/20 1123    Clinical Impression Statement She is  a little sore and tired at end. She is doing well with all exerises able to do all except SLR , hip abduct and ER without assist to max full ROM.    PT Treatment/Interventions Dry needling;Passive range of motion;Patient/family education;Iontophoresis 50m/ml Dexamethasone;Electrical Stimulation;Ultrasound;Moist Heat;Manual techniques    PT Next Visit Plan Cont HEP  add sidelye  exercise to HEP  add standing  exer to HEP , strength ROm ,  modalities if needed                  FOTO next    PT Home Exercise Plan SLE , bridge, clams, ball squeeze    Consulted and Agree with Plan of Care Patient           Patient will benefit from skilled therapeutic intervention in order to improve the following deficits and impairments:  Pain,Postural dysfunction,Decreased strength,Decreased activity tolerance,Decreased range of motion,Difficulty walking,Decreased endurance  Visit Diagnosis: Muscle weakness (generalized)  Stiffness of right hip, not elsewhere classified  Difficulty in walking, not elsewhere classified  Chronic right-sided low back pain without sciatica     Problem List Patient Active Problem List   Diagnosis Date Noted   Lumbar back pain 01/25/2020   Right hip pain 10/07/2019   Left  hip pain 06/26/2019   Muscle strain of lower extremity, left, initial encounter 06/26/2019   Healthcare maintenance 06/26/2019   Iron deficiency anemia 04/14/2018   Hematuria, gross 03/29/2018   Iron deficiency anemia due to chronic blood loss 03/29/2018   Long term current use of anticoagulant 03/29/2018   Antiphospholipid antibody syndrome ([place]) 01/25/2018   Morbid obesity ([place]) 07/26/2017   Systemic lupus erythematosus ([place])  07/08/2017   Hx pulmonary embolism 07/08/2017   Chronic pain of right knee 05/23/2017   Renal mass 01/26/2017   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/21/2016   Vitamin D deficiency 12/16/2016   Anemia    Migraine     Darrel Hoover  PT 02/19/2020, 11:29 AM  [facility] [address] [place], [place], [address] Phone: [phone]   Fax:  [phone]  Name: Melissa Gaines MRN: [id number] Date of Birth: 25-Jan-1971

## 2020-02-20 ENCOUNTER — Ambulatory Visit (INDEPENDENT_AMBULATORY_CARE_PROVIDER_SITE_OTHER): Payer: Medicare Other | Admitting: Neurology

## 2020-02-20 ENCOUNTER — Encounter: Payer: Self-pay | Admitting: Neurology

## 2020-02-20 VITALS — BP 116/85 | HR 93 | Ht <= 58 in | Wt 219.8 lb

## 2020-02-20 DIAGNOSIS — G43709 Chronic migraine without aura, not intractable, without status migrainosus: Secondary | ICD-10-CM

## 2020-02-20 DIAGNOSIS — Z87898 Personal history of other specified conditions: Secondary | ICD-10-CM

## 2020-02-20 DIAGNOSIS — M5481 Occipital neuralgia: Secondary | ICD-10-CM | POA: Diagnosis not present

## 2020-02-20 MED ORDER — AIMOVIG 140 MG/ML ~~LOC~~ SOAJ: 140.0000 mg | SUBCUTANEOUS | 5 refills | Status: DC

## 2020-02-20 MED ORDER — UBRELVY 100 MG PO TABS: 1.0000 | ORAL_TABLET | ORAL | 5 refills | Status: DC | PRN

## 2020-02-20 NOTE — Patient Instructions (Addendum)
1.  Will increase Aimovig to 140mg  every 30 days 2.  Will place prescription for Ubrelvy.  Otherwise, may use sumatriptan for migraine 3.  Limit use of pain relievers to no more than 2 days out of week to prevent risk of rebound or medication-overuse headache. 4.  Will check MRI of brain with and without contrast. We have sent a referral to [facility] for your MRI and they will call you directly to schedule your appointment. They are located at [place]. If you need to contact them directly please call [phone].  5.  Keep headache diary 6.  Follow up 6 months.

## 2020-02-20 NOTE — Progress Notes (Signed)
Melissa Gaines (Key: [id number]) Melissa Gaines 100MG  tablets   Form [employer] Medicare Part D Electronic Prior Authorization Form (2017 NCPDP) Created 18 minutes ago Sent to Plan 17 minutes ago Plan Response 14 minutes ago Submit Clinical Questions 12 minutes ago Determination Favorable 9 minutes ago Message from Plan Request Reference Number: [id number]. UBRELVY TAB 100MG  is approved through 03/07/2021. Your patient may now fill this prescription and it will be covered.

## 2020-02-21 ENCOUNTER — Other Ambulatory Visit: Payer: Self-pay

## 2020-02-21 ENCOUNTER — Ambulatory Visit: Payer: Medicaid Other

## 2020-02-21 DIAGNOSIS — M545 Low back pain, unspecified: Secondary | ICD-10-CM

## 2020-02-21 DIAGNOSIS — M6281 Muscle weakness (generalized): Secondary | ICD-10-CM

## 2020-02-21 DIAGNOSIS — M25651 Stiffness of right hip, not elsewhere classified: Secondary | ICD-10-CM

## 2020-02-21 DIAGNOSIS — M62830 Muscle spasm of back: Secondary | ICD-10-CM

## 2020-02-21 DIAGNOSIS — R262 Difficulty in walking, not elsewhere classified: Secondary | ICD-10-CM

## 2020-02-21 DIAGNOSIS — M25552 Pain in left hip: Secondary | ICD-10-CM

## 2020-02-21 NOTE — Therapy (Signed)
[place], [place], [address] Phone: [phone]   Fax:  [phone]  Physical Therapy Treatment  Patient Details  Name: Melissa Gaines MRN: [id number] Date of Birth: [birth year] Referring Provider (PT): Eunice Blase, MD   Encounter Date: 02/21/2020   PT End of Session - 02/21/20 1122    Visit Number 7    Number of Visits 16    Date for PT Re-Evaluation 03/21/20    Authorization Type UHC MCR/ MCD    Authorization Time Period FOTO visit 6-7,             [place] visit 16    Progress Note Due on Visit 10    PT Start Time 1035    PT Stop Time 1121    PT Time Calculation (min) 46 min    Activity Tolerance Patient tolerated treatment well    Behavior During Therapy [facility] for tasks assessed/performed           Past Medical History:  Diagnosis Date  . Anemia   . Anxiety   . Asthma   . Chronic pain   . Congenital hip dislocation   . GERD (gastroesophageal reflux disease)   . Iron deficiency anemia 04/14/2018  . Kidney mass 2017  . Migraine   . Osteoporosis   . Pathological dislocation of shoulder joint, bilateral    congential  . PTSD (post-traumatic stress disorder)   . Sleep apnea   . Systemic lupus erythematosus ([place])     Past Surgical History:  Procedure Laterality Date  . CESAREAN SECTION  2005  . CESAREAN SECTION W/BTL  2010  . HIP SURGERY     in childhood for congenital hip dislocation  . [place] RESECTION  2014  . STAPEDECTOMY  11/01/2011   L postauricular stapedectomy with CO2 laser and insertion of 6 x 4.75 mm SMart Piston  . TOTAL HIP ARTHROPLASTY     05/2000 R, 11/2000 L    There were no vitals filed for this visit.   Subjective Assessment - 02/21/20 1051    Subjective No pain mainly discomfort in lower back.                             [place] Adult PT Treatment/Exercise - 02/21/20 0001      Lumbar Exercises: Aerobic   Nustep  L6 LE only 6  min short ROM      Lumbar Exercises: Standing   Heel Raises 20 reps    Functional Squats 20 reps    Other Standing Lumbar Exercises 3 way hip x 20 each 3 #each leg , step ups 4 inch x 15 each at free motion bar      Lumbar Exercises: Supine   Bridge Limitations 25 reps    Straight Leg Raise 15 reps    Straight Leg Raises Limitations RTR and LT . RT lifted more independently    Other Supine Lumbar Exercises LTR  legs on red thera ball x 30 RT/LT, then bilateral hamstring rolling ball toward hips.      Lumbar Exercises: Sidelying   Clam Right;Left;20 reps    Hip Abduction Right;Left;20 reps          At end nustep 3 sets of  L8  2 min bouts with 1 min rest          PT Short Term Goals - 02/14/20 1115      PT  SHORT TERM GOAL #1   Title pt will be indpendent with short term HEP    Status Achieved      PT SHORT TERM GOAL #2   Title She will report pain decreased 25% with walking and standing for 10 min    Baseline She reports improvement  but maybe not 25%    Status Partially Met             PT Long Term Goals - 01/15/20 0941      PT LONG TERM GOAL #1   Title She will be able to walk with least restricive device out of home for shopping and access to appointments    Time 8    Period Weeks    Status New      PT LONG TERM GOAL #2   Title pt will be able to get into/out of bed & car with min difficulty    Time 8    Period Weeks    Status New      PT LONG TERM GOAL #3   Title She will be able to walk in home with no device with min pain    Time 8    Period Weeks    Status New      PT LONG TERM GOAL #4   Title pt will be independent in long term HEP    Time 8    Period Weeks    Status New      PT LONG TERM GOAL #5   Title FOTO score improved to 35% or better    Time 8    Period Weeks    Status New                 Plan - 02/21/20 1122    Clinical Impression Statement She is doing very well with exercise tolerance with increased  weight  Pain not increased in back at end  though with more extended postures she does get some increased pain  that decr with changing postion.    PT Treatment/Interventions Dry needling;Passive range of motion;Patient/family education;Iontophoresis 12m/ml Dexamethasone;Electrical Stimulation;Ultrasound;Moist Heat;Manual techniques    PT Next Visit Plan Cont HEP  add sidelye  exercise to HEP  add standing  exer to HEP , strength ROm ,  modalities if needed                  FOTO next  goals , possible renewal.    PT Home Exercise Plan SLE , bridge, clams, ball squeeze    Consulted and Agree with Plan of Care Patient           Patient will benefit from skilled therapeutic intervention in order to improve the following deficits and impairments:  Pain,Postural dysfunction,Decreased strength,Decreased activity tolerance,Decreased range of motion,Difficulty walking,Decreased endurance  Visit Diagnosis: Muscle weakness (generalized)  Stiffness of right hip, not elsewhere classified  Difficulty in walking, not elsewhere classified  Chronic right-sided low back pain without sciatica  Pain in left hip  Muscle spasm of back     Problem List Patient Active Problem List   Diagnosis Date Noted  . Lumbar back pain 01/25/2020  . Right hip pain 10/07/2019  . Left hip pain 06/26/2019  . Muscle strain of lower extremity, left, initial encounter 06/26/2019  . Healthcare maintenance 06/26/2019  . Iron deficiency anemia 04/14/2018  . Hematuria, gross 03/29/2018  . Iron deficiency anemia due to chronic blood loss 03/29/2018  . Long term current use of anticoagulant 03/29/2018  .  Antiphospholipid antibody syndrome ([place]) 01/25/2018  . Morbid obesity ([place]) 07/26/2017  . Systemic lupus erythematosus ([place]) 07/08/2017  . Hx pulmonary embolism 07/08/2017  . Chronic pain of right knee 05/23/2017  . Renal mass 01/26/2017  . 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/21/2016  . Vitamin D deficiency 12/16/2016  . Anemia    . Migraine     Darrel Hoover 02/21/2020, 11:28 AM  [facility] [address] [place], [place], [address] Phone: [phone]   Fax:  [phone]  Name: Melissa Gaines MRN: [id number] Date of Birth: 1970/11/19

## 2020-02-26 DIAGNOSIS — H524 Presbyopia: Secondary | ICD-10-CM | POA: Diagnosis not present

## 2020-03-10 ENCOUNTER — Other Ambulatory Visit: Payer: Self-pay

## 2020-03-10 ENCOUNTER — Ambulatory Visit (HOSPITAL_COMMUNITY): Payer: Medicare Other | Admitting: Psychiatry

## 2020-03-11 ENCOUNTER — Other Ambulatory Visit: Payer: Self-pay | Admitting: Family Medicine

## 2020-03-11 DIAGNOSIS — J4540 Moderate persistent asthma, uncomplicated: Secondary | ICD-10-CM

## 2020-03-12 ENCOUNTER — Other Ambulatory Visit: Payer: Self-pay

## 2020-03-12 ENCOUNTER — Ambulatory Visit: Payer: 59 | Attending: Family Medicine

## 2020-03-12 DIAGNOSIS — M25552 Pain in left hip: Secondary | ICD-10-CM | POA: Insufficient documentation

## 2020-03-12 DIAGNOSIS — M6281 Muscle weakness (generalized): Secondary | ICD-10-CM | POA: Diagnosis present

## 2020-03-12 DIAGNOSIS — M25652 Stiffness of left hip, not elsewhere classified: Secondary | ICD-10-CM | POA: Diagnosis present

## 2020-03-12 DIAGNOSIS — M545 Low back pain, unspecified: Secondary | ICD-10-CM | POA: Diagnosis present

## 2020-03-12 DIAGNOSIS — R262 Difficulty in walking, not elsewhere classified: Secondary | ICD-10-CM | POA: Diagnosis present

## 2020-03-12 DIAGNOSIS — G8929 Other chronic pain: Secondary | ICD-10-CM | POA: Diagnosis present

## 2020-03-12 DIAGNOSIS — M25651 Stiffness of right hip, not elsewhere classified: Secondary | ICD-10-CM | POA: Diagnosis present

## 2020-03-12 DIAGNOSIS — M62830 Muscle spasm of back: Secondary | ICD-10-CM | POA: Insufficient documentation

## 2020-03-12 NOTE — Patient Instructions (Signed)
Standing  Heel raises, hip flexion/abduction/extension/mini squats  X 10-20 reps  1-2x/day,   Bands or cuff weights as able

## 2020-03-12 NOTE — Therapy (Signed)
[place], [place], [address] Phone: [phone]   Fax:  [phone]  Physical Therapy Treatment/Discharge  Patient Details  Name: Melissa Gaines MRN: [id number] Date of Birth: 04-22-1970 Referring Provider (PT): Eunice Blase, MD   Encounter Date: 03/12/2020   PT End of Session - 03/12/20 0943    Visit Number 8    Number of Visits 16    Date for PT Re-Evaluation 03/21/20    Authorization Type UHC MCR/ MCD    Authorization Time Period FOTO visit 6-7,             [place] visit 16    Progress Note Due on Visit 10    PT Start Time 0950    PT Stop Time 1040    PT Time Calculation (min) 50 min    Activity Tolerance Patient tolerated treatment well    Behavior During Therapy [facility] for tasks assessed/performed           Past Medical History:  Diagnosis Date  . Anemia   . Anxiety   . Asthma   . Chronic pain   . Congenital hip dislocation   . GERD (gastroesophageal reflux disease)   . Iron deficiency anemia 04/14/2018  . Kidney mass 2017  . Migraine   . Osteoporosis   . Pathological dislocation of shoulder joint, bilateral    congential  . PTSD (post-traumatic stress disorder)   . Sleep apnea   . Systemic lupus erythematosus ([place])     Past Surgical History:  Procedure Laterality Date  . CESAREAN SECTION  2005  . CESAREAN SECTION W/BTL  2010  . HIP SURGERY     in childhood for congenital hip dislocation  . [place] RESECTION  2014  . STAPEDECTOMY  11/01/2011   L postauricular stapedectomy with CO2 laser and insertion of 6 x 4.75 mm SMart Piston  . TOTAL HIP ARTHROPLASTY     05/2000 R, 11/2000 L    There were no vitals filed for this visit.   Subjective Assessment - 03/12/20 1006    Subjective LBP due to cold weather.    Pain Score 3     Pain Location Back    Pain Orientation Lower    Pain Descriptors / Indicators Discomfort    Pain Type Chronic pain    Pain  Onset More than a month ago    Pain Frequency Intermittent    Aggravating Factors  standing/                             [facility] Adult PT Treatment/Exercise - 03/12/20 0001      Lumbar Exercises: Aerobic   Nustep L6 LE only 6  min short ROM      Lumbar Exercises: Supine   Bridge Limitations 25 reps             Old HEP reviewed and new exercise issued. She agreed to both for post discharge     PT Education - 03/12/20 1043    Education Details HEP,   assessing prgress with HEP    Person(s) Educated Patient    Methods Explanation;Verbal cues;Handout    Comprehension Verbalized understanding;Returned demonstration            PT Short Term Goals - 03/12/20 1035      PT SHORT TERM GOAL #1   Title pt will be indpendent with short term HEP  Status Achieved      PT SHORT TERM GOAL #2   Title She will report pain decreased 25% with walking and standing for 10 min    Status Achieved      PT SHORT TERM GOAL #3   Title She will be able to bend 60 degrees before incr pain    Status Achieved      PT SHORT TERM GOAL #4   Title FOTO score improved 8-10 points    Baseline Score 18% to 31%    Status Achieved             PT Long Term Goals - 03/12/20 1036      PT LONG TERM GOAL #1   Title She will be able to walk with least restricive device out of home for shopping and access to appointments    Status Achieved      PT LONG TERM GOAL #2   Title pt will be able to get into/out of bed & car with min difficulty    Baseline improved but still difficult    Status Partially Met      PT LONG TERM GOAL #3   Title She will be able to walk in home with no device with min pain    Status Achieved      PT LONG TERM GOAL #4   Title pt will be independent in long term HEP    Status Achieved      PT LONG TERM GOAL #5   Title FOTO score improved to 35% or better    Baseline 31%    Status Not Met                 Plan - 03/12/20 0943    Clinical  Impression Statement FOTO score improved but not to predicted level but  fewervisits than expected. She is good with discharge and doing HEP for a period and to return to MD if no progress or increased pain.    PT Treatment/Interventions Dry needling;Passive range of motion;Patient/family education;Iontophoresis 7m/ml Dexamethasone;Electrical Stimulation;Ultrasound;Moist Heat;Manual techniques    PT Next Visit Plan Cont HEP  add sidelye  exercise to HEP  add standing  exer to HEP , strength ROm ,  modalities if needed                  FOTO next  goals , possible renewal.    PT Home Exercise Plan SLE , bridge, clams, ball squeeze    Standing march /abductin/extension,  heel raises and mini squats.    Consulted and Agree with Plan of Care Patient           Patient will benefit from skilled therapeutic intervention in order to improve the following deficits and impairments:  Pain,Postural dysfunction,Decreased strength,Decreased activity tolerance,Decreased range of motion,Difficulty walking,Decreased endurance  Visit Diagnosis: Muscle weakness (generalized)  Stiffness of right hip, not elsewhere classified  Difficulty in walking, not elsewhere classified  Chronic right-sided low back pain without sciatica  Pain in left hip  Muscle spasm of back  Stiffness of left hip, not elsewhere classified     Problem List Patient Active Problem List   Diagnosis Date Noted  . Lumbar back pain 01/25/2020  . Right hip pain 10/07/2019  . Left hip pain 06/26/2019  . Muscle strain of lower extremity, left, initial encounter 06/26/2019  . Healthcare maintenance 06/26/2019  . Iron deficiency anemia 04/14/2018  . Hematuria, gross 03/29/2018  . Iron deficiency anemia due to chronic  blood loss 03/29/2018  . Long term current use of anticoagulant 03/29/2018  . Antiphospholipid antibody syndrome ([place]) 01/25/2018  . Morbid obesity ([place]) 07/26/2017  . Systemic lupus erythematosus ([place]) 07/08/2017  .  Hx pulmonary embolism 07/08/2017  . Chronic pain of right knee 05/23/2017  . Renal mass 01/26/2017  . 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/21/2016  . Vitamin D deficiency 12/16/2016  . Anemia   . Migraine     Darrel Hoover  PT 03/12/2020, 10:44 AM  [facility] [address] [place], [place], [address] Phone: [phone]   Fax:  [phone]  Name: Melissa Gaines MRN: [id number] Date of Birth: [birth year]  PHYSICAL THERAPY DISCHARGE SUMMARY  Visits from Start of Care: 8  Current functional level related to goals / functional outcomes: See above   Remaining deficits: See above . Continued weakness of hips and decr ROM. Much of this is long standing   Education / Equipment: HEP  Plan: Patient agrees to discharge.  Patient goals were partially met. Patient is being discharged due to being pleased with the current functional level.  ?????   Pearson Forster PT

## 2020-03-13 ENCOUNTER — Other Ambulatory Visit: Payer: Self-pay

## 2020-03-13 ENCOUNTER — Inpatient Hospital Stay: Payer: 59 | Attending: Nurse Practitioner

## 2020-03-13 VITALS — BP 109/77 | HR 100 | Resp 18

## 2020-03-13 DIAGNOSIS — D508 Other iron deficiency anemias: Secondary | ICD-10-CM

## 2020-03-13 DIAGNOSIS — E538 Deficiency of other specified B group vitamins: Secondary | ICD-10-CM | POA: Diagnosis not present

## 2020-03-13 DIAGNOSIS — Z79899 Other long term (current) drug therapy: Secondary | ICD-10-CM | POA: Diagnosis not present

## 2020-03-13 MED ORDER — CYANOCOBALAMIN 1000 MCG/ML IJ SOLN
INTRAMUSCULAR | Status: AC
  Filled 2020-03-13: qty 1

## 2020-03-13 MED ORDER — CYANOCOBALAMIN 1000 MCG/ML IJ SOLN
1000.0000 ug | INTRAMUSCULAR | Status: DC
  Administered 2020-03-13: 1000 ug via INTRAMUSCULAR

## 2020-03-13 NOTE — Patient Instructions (Signed)

## 2020-04-02 ENCOUNTER — Encounter (HOSPITAL_COMMUNITY): Payer: Self-pay | Admitting: Psychiatry

## 2020-04-02 ENCOUNTER — Other Ambulatory Visit: Payer: Self-pay

## 2020-04-02 ENCOUNTER — Ambulatory Visit (INDEPENDENT_AMBULATORY_CARE_PROVIDER_SITE_OTHER): Payer: Medicare Other | Admitting: Psychiatry

## 2020-04-02 DIAGNOSIS — F331 Major depressive disorder, recurrent, moderate: Secondary | ICD-10-CM

## 2020-04-02 NOTE — Progress Notes (Signed)
Virtual Visit via Video Note  I connected with Melissa Gaines on 04/02/20 at  2:30 PM EST by a video enabled telemedicine application and verified that I am speaking with the correct person using two identifiers.  Location: Patient: Patient Home Provider: Home Office  I discussed the limitations of evaluation and management by telemedicine and the availability of in person appointments. The patient expressed understanding and agreed to proceed.  History of Present Illness: MDD  Treatment Plan Goals: 1)REVISED 02/18/20: To reduce stress by improving effectiveness of parenting skills and strategies through consistent and proper application and to process trauma related to her parent-child experiences growing up.   2)To decrease depressive symptoms by addressing pain management with providers, by applying CBT skills and through processing thoughts and feelings in session.  Observations/Objective: Counselor met with Client for individual therapy via Webex. Counselor assessed MH symptoms and progress on treatment plan goals, with patient reportingthat she is maintaining and doing "as good as can be expected".Client presents withmoderatedepression and mildanxiety. Client denied suicidal ideation or self-harm behaviors.   Counselor assessed progress on goals 1 and 2, with Client stating that she is stable with her mental health. Client stated that her main problem is her physical health and pain management, which she is being followed closely by specialists. Counselor assessed impact of physical health on mental health. Client stated that she is ok, because every day is the same, no issues to report. Counselor and Client assessed need to continue in treatment or to discharge. Client stated that she would like to check in, in a few months, but did not need to meet as frequent. Counselor clarified needs and discussed a discharge plan. Client satisfied with plan to move  forward with once a month or less for next few months.   Assessment and Plan: Counselor will continue to meet with patient to address treatment plan goals. Patient will continue to follow recommendations of providers and implement skills learned in session.  Follow Up Instructions: Counselor will send information for next session via Webex.   The patient was advised to call back or seek an in-person evaluation if the symptoms worsen or if the condition fails to improve as anticipated.  I provided 15 minutes of non-face-to-face time during this encounter.   Lise Auer, LCSW

## 2020-04-11 ENCOUNTER — Telehealth: Payer: Self-pay

## 2020-04-11 NOTE — Telephone Encounter (Signed)
Patient called and reported SOB, chest pain and cough.  This nurse advised to get a COVID test and that the patient was unable to come in for her appointment until the test resulted negative.  Patient advised to cancel on Monday if results  not back.  Patient verbalized understanding.

## 2020-04-11 NOTE — Progress Notes (Incomplete)
[place]   [phone] [phone]   Clinic Follow up Note   Patient Care Team: [name], MD as PCP - Almond Lint, DO as Consulting Physician (Neurology)  Date of Service:  04/11/2020  CHIEF COMPLAINT: F/uiron deficiency and B12 deficiency anemia  DIAGNOSIS LIST: 1. Anemia- on 12/19/2017 ferritin 6 serum iron 19 TIBC 478, 4% transferrin saturationHgb 12.2 consistent with iron deficiency, began oral iron; ferritin on 04/14/18 <4, patient received IV Feraheme weekly x2 on 04/28/18 and 05/05/18   2.B12 on 10/13/27 low 164; pending GI eval. She reports history of B12 deficiency and injections while living in [place], none in last 3 years since relocating to [facility]. H/o gastric bypass surgery in 2014  3. 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 G2P2  4. SLE - presented with butterfly rash in 2005, per historical note she was treated with plaquenil and prednisone but changed to plaquenil and methotrexate due to drug AE's. Positive ANA titer on 11/15/2016; lupus anticoagulant positive on 07/08/17, 07/22/17, and 05/01/18.The antiphospholipid antibody IgG 46.1 (<15); antiphospholipid antibody IgM 13.4 (<15); antiphospholipid antibody IgA 21(<15);beta-2 glycoprotein 1 antibody IgM19.4 (<15); beta-2 glycoprotein antibody IgG G 50.2 (<15).Followed by RheumatologistDr. Jacolyn Reedy [facility]  5. Hypercoagulation work up:On 12/19/2017 showed slightly elevated protein C activity to 181%(range 73-180%)and elevated factor VIII assay to 223%,elevated beta-2 glycoprotein IgG to 27,and elevated cardiolipin antibodies IgG, IgM, and IgA; otherwisehemoglobinopathy evaluation, AntithrombinIII,protein S activity;factor V Leiden, andprothrombin gene were normal   CURRENT THERAPY: 1.IV Feraheme 510 mg 04/28/18, 05/05/18, 11/02/2018 2. Oral ferrous sulfate, takes 1-3  tabs daily  3. Apixaban 2.5 mg BID 4. B12 injection 1000 mcg monthlystarting 10/2018  INTERVAL HISTORY: *** Melissa Gaines is here for a follow up of IDA and B12 deficiency. She was last seen by me 6 months ago. She presents to the clinic alone.    REVIEW OF SYSTEMS:  *** Constitutional: Denies fevers, chills or abnormal weight loss Eyes: Denies blurriness of vision Ears, nose, mouth, throat, and face: Denies mucositis or sore throat Respiratory: Denies cough, dyspnea or wheezes Cardiovascular: Denies palpitation, chest discomfort or lower extremity swelling Gastrointestinal:  Denies nausea, heartburn or change in bowel habits Skin: Denies abnormal skin rashes Lymphatics: Denies new lymphadenopathy or easy bruising Neurological:Denies numbness, tingling or new weaknesses Behavioral/Psych: Mood is stable, no new changes  All other systems were reviewed with the patient and are negative.  MEDICAL HISTORY:  Past Medical History:  Diagnosis Date  . Anemia   . Anxiety   . Asthma   . Chronic pain   . Congenital hip dislocation   . GERD (gastroesophageal reflux disease)   . Iron deficiency anemia 04/14/2018  . Kidney mass 2017  . Migraine   . Osteoporosis   . Pathological dislocation of shoulder joint, bilateral    congential  . PTSD (post-traumatic stress disorder)   . Sleep apnea   . Systemic lupus erythematosus ([place])     SURGICAL HISTORY: Past Surgical History:  Procedure Laterality Date  . CESAREAN SECTION  2005  . CESAREAN SECTION W/BTL  2010  . HIP SURGERY     in childhood for congenital hip dislocation  . [place] RESECTION  2014  . STAPEDECTOMY  11/01/2011   L postauricular stapedectomy with CO2 laser and insertion of 6 x 4.75 mm SMart Piston  . TOTAL HIP ARTHROPLASTY     05/2000 R, 11/2000 L  I have reviewed the social history and family history with the patient and they are unchanged from previous note.  ALLERGIES:  is  allergic to fish allergy.  MEDICATIONS:  Current Outpatient Medications  Medication Sig Dispense Refill  . acetaminophen (TYLENOL) 500 MG tablet Take 500 mg by mouth every 6 (six) hours as needed for headache.    . albuterol (PROVENTIL) (2.5 MG/3ML) 0.083% nebulizer solution Take 3 mLs (2.5 mg total) by nebulization every 6 (six) hours as needed for wheezing or shortness of breath. 150 mL 1  . apixaban (ELIQUIS) 5 MG TABS tablet Take 5 mg by mouth 2 (two) times daily.    . cyanocobalamin (,VITAMIN B-12,) 1000 MCG/ML injection Inject 1 mL (1,000 mcg total) into the skin every 30 (thirty) days. 5 mL 0  . cycloSPORINE (RESTASIS) 0.05 % ophthalmic emulsion Place 1 drop into both eyes 2 (two) times daily. 0.4 mL 0  . Erenumab-aooe (AIMOVIG) 140 MG/ML SOAJ Inject 140 mg into the skin every 30 (thirty) days. 1.12 mL 5  . ferrous sulfate 325 (65 FE) MG tablet Take 325 mg by mouth daily with breakfast.    . gabapentin (NEURONTIN) 100 MG capsule TAKE 3 CAPSULE EVERY NIGHT AT BEDTIME 90 capsule 3  . hydrOXYzine (VISTARIL) 50 MG capsule Take 1 capsule (50 mg total) by mouth at bedtime. 90 capsule 0  . naproxen (NAPROSYN) 500 MG tablet Take 500 mg by mouth 2 (two) times daily.    [name] Kitchen omeprazole (PRILOSEC) 40 MG capsule Take 1 capsule (40 mg total) by mouth daily. 30 capsule 2  . PARoxetine (PAXIL) 40 MG tablet Take 1 tablet (40 mg total) by mouth daily. 90 tablet 0  . PROAIR RESPICLICK 024 (90 Base) MCG/ACT AEPB INHALE 1 INHALATION BY  MOUTH INTO THE LUNGS EVERY  4 HOURS AS NEEDED FOR  WHEEZING, COUGHING 3 each 3  . SYRINGE/NEEDLE, DISP, 1 ML 25G X 5/8" 1 ML MISC Match with B12 3 each 3  . Ubrogepant (UBRELVY) 100 MG TABS Take 1 tablet by mouth as needed (May repeat in 2 hours.  Maximum 2 tablets in 24 hours). 10 tablet 5  . Vitamin D, Ergocalciferol, (DRISDOL) 50000 units CAPS capsule Take 1 capsule (50,000 Units total) by mouth every 7 (seven) days. 8 capsule 0   No current facility-administered medications  for this visit.    PHYSICAL EXAMINATION: ECOG PERFORMANCE STATUS: {CHL ONC ECOG PS:[phone]}  There were no vitals filed for this visit. There were no vitals filed for this visit. *** GENERAL:alert, no distress and comfortable SKIN: skin color, texture, turgor are normal, no rashes or significant lesions EYES: normal, Conjunctiva are pink and non-injected, sclera clear {OROPHARYNX:no exudate, no erythema and lips, buccal mucosa, and tongue normal}  NECK: supple, thyroid normal size, non-tender, without nodularity LYMPH:  no palpable lymphadenopathy in the cervical, axillary {or inguinal} LUNGS: clear to auscultation and percussion with normal breathing effort HEART: regular rate & rhythm and no murmurs and no lower extremity edema ABDOMEN:abdomen soft, non-tender and normal bowel sounds Musculoskeletal:no cyanosis of digits and no clubbing  NEURO: alert & oriented x 3 with fluent speech, no focal motor/sensory deficits  LABORATORY DATA:  I have reviewed the data as listed CBC Latest Ref Rng & Units 10/26/2019 07/27/2019 04/30/2019  WBC 4.0 - 10.5 K/uL 7.2 7.2 7.5  Hemoglobin 12.0 - 15.0 g/dL 13.8 14.2 14.1  Hematocrit 36.0 - 46.0 % 42.9 43.0 42.8  Platelets 150 - 400 K/uL 378 293 303  CMP Latest Ref Rng & Units 10/26/2019 07/27/2019 04/30/2019  Glucose 70 - 99 mg/dL 86 123(H) 91  BUN 6 - 20 mg/dL 5(L) 8 7  Creatinine 0.44 - 1.00 mg/dL 0.65 0.64 0.61  Sodium 135 - 145 mmol/L 139 139 140  Potassium 3.5 - 5.1 mmol/L 4.1 3.7 3.9  Chloride 98 - 111 mmol/L 104 107 105  CO2 22 - 32 mmol/L 24 23 24   Calcium 8.9 - 10.3 mg/dL 9.7 9.3 8.7(L)  Total Protein 6.5 - 8.1 g/dL 8.1 8.0 7.6  Total Bilirubin 0.3 - 1.2 mg/dL 0.3 0.3 0.2(L)  Alkaline Phos 38 - 126 U/L 117 73 58  AST 15 - 41 U/L 9(L) 12(L) 13(L)  ALT 0 - 44 U/L 9 13 15       RADIOGRAPHIC STUDIES: I have personally reviewed the radiological images as listed and agreed with the findings in the report. No results found.    ASSESSMENT & PLAN:  Melissa Gaines is a 50 y.o. female with   1.Iron deficiencyanemia due to chronic blood loss from menorrhagia and malabsorption due to gastric bypass surgery -She takes oral iron 1-3 times dailywhich she tolerates well, has required IV Feraheme infrequently to keep goal ferritin >50 -S/p IV Feraheme x2 in 04/2018. She initially responded well, but ferritin on 10/13/18 down to 16and she became symptomaticwith fatigue.Received IV Feraheme x1 on 11/02/2018 -She will continue IV Feraheme as needed, last on 05/15/19.  -From an Anemia standpoint she is stable. Labs reviewed, Hg normal, Ferritin 98, Iron 43, Sat ratio 13. MMA and B12 still pending. Her anemia is doing well lately.  -She will continue oral iron and IV Feraheme as needed to keep ferritin >50.  -F/u in 6 months  -She has gotten 1/2 COVID19 vaccines and will get 2nd dose on 10/31/19.    2.B12deficiency, secondary to previous gastric bypass surgery -8/7/20B12level decreased to 164with signs of peripheral neuropathy. -She began monthly B12 inj in 10/2018, her level is stable around 220 -continue monthly injections at [facility], her insurance does not cover home administration -B12 is still pending based on level may increase her injections to every 2 or 3 weeks. Will proceed with injection today   3.SLE  - followed by Rheum at [facility]  4.Antiphospholipidsyndrome -continue Eliquis indefinitely,tolerating well without bleeding -Refills per PCP  -Given menorrhagia, I recommend she hold Eliquis for 3 days during her periods.   5. Migraines, Chronic Back Pain, Congenital hip dysplasia  -followed by Neurology -She has congenital hip dislocation since an infant and has require surgery on both hips. She has chronic pain from this.  -She had a fall 3 weeks ago from a slip and fall and exacerbated her chronic back pain. She has used walker for the past week. She was on Naprosyn by PCP office,  I discussed using Tylenol now instead.  -I recommend her to see orthopedist, I will refer her.   6. Overweight  -I discussed reducing her weight can improve her overall health. She was previously referred to [facility] health weight and wellness clinic but did not go due to costs.  -She does have chronic back and hip issues which can reduce her activity.  -I recommend change in diet with less carbohydrates, red meat, fats, fried foods, soft drinks or sweets. She should increase vegetable and chicken and [place] and snack in between as indicated.    7. Financial Support  -She has [employer], Medicaid and Medicare.   PLAN: -Send referral to [facility] surgery, I  gave her their contact information  -Proceed with B12 injection today  -Continue monthly B12 inj  -IV Feraheme if Ferritin<50 -Continue oral iron  -Lab and f/u in 6 months    No problem-specific Assessment & Plan notes found for this encounter.   No orders of the defined types were placed in this encounter.  All questions were answered. The patient knows to call the clinic with any problems, questions or concerns. No barriers to learning was detected. The total time spent in the appointment was {CHL ONC TIME VISIT - ZX:[id number].     Joslyn Devon 04/11/2020   Oneal Deputy, am acting as scribe for Truitt Merle, MD.   {Add scribe attestation statement}

## 2020-04-14 ENCOUNTER — Ambulatory Visit: Payer: Medicare HMO

## 2020-04-14 ENCOUNTER — Telehealth: Payer: Self-pay

## 2020-04-14 ENCOUNTER — Telehealth: Payer: Self-pay | Admitting: Nurse Practitioner

## 2020-04-14 ENCOUNTER — Ambulatory Visit: Payer: Medicare HMO | Admitting: Hematology

## 2020-04-14 NOTE — Telephone Encounter (Signed)
Scheduled appointments per 2/7 sch msg. Called patient, no answer. Left message with appointments date and times.

## 2020-04-14 NOTE — Telephone Encounter (Signed)
I spoke with Ms Melissa Gaines this am.  She did not get tested for covid this weekend.  Appointments for this am cancelled.  Message sent to scheduling to reschedule for mid march.  Ms Melissa Gaines verbalized understanding.

## 2020-04-17 ENCOUNTER — Ambulatory Visit (HOSPITAL_COMMUNITY): Payer: 59 | Admitting: Psychiatry

## 2020-04-17 ENCOUNTER — Other Ambulatory Visit (HOSPITAL_COMMUNITY): Payer: Self-pay | Admitting: Psychiatry

## 2020-04-17 DIAGNOSIS — F331 Major depressive disorder, recurrent, moderate: Secondary | ICD-10-CM

## 2020-04-17 DIAGNOSIS — F4310 Post-traumatic stress disorder, unspecified: Secondary | ICD-10-CM

## 2020-04-30 ENCOUNTER — Telehealth: Payer: Self-pay | Admitting: Nurse Practitioner

## 2020-04-30 NOTE — Telephone Encounter (Signed)
Moved upcoming appointment due to provider's template. Patient is aware of changes. 

## 2020-05-05 ENCOUNTER — Encounter (HOSPITAL_COMMUNITY): Payer: Self-pay | Admitting: Psychiatry

## 2020-05-05 ENCOUNTER — Telehealth (INDEPENDENT_AMBULATORY_CARE_PROVIDER_SITE_OTHER): Payer: Medicare Other | Admitting: Psychiatry

## 2020-05-05 ENCOUNTER — Other Ambulatory Visit: Payer: Self-pay

## 2020-05-05 ENCOUNTER — Ambulatory Visit (HOSPITAL_COMMUNITY): Payer: 59 | Admitting: Psychiatry

## 2020-05-05 DIAGNOSIS — F331 Major depressive disorder, recurrent, moderate: Secondary | ICD-10-CM | POA: Diagnosis not present

## 2020-05-05 DIAGNOSIS — F4310 Post-traumatic stress disorder, unspecified: Secondary | ICD-10-CM

## 2020-05-05 MED ORDER — PAROXETINE HCL 40 MG PO TABS: 40.0000 mg | ORAL_TABLET | Freq: Every day | ORAL | 0 refills | Status: DC

## 2020-05-05 MED ORDER — HYDROXYZINE PAMOATE 50 MG PO CAPS: 50.0000 mg | ORAL_CAPSULE | Freq: Every day | ORAL | 0 refills | Status: DC
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
## 2020-05-13 ENCOUNTER — Other Ambulatory Visit: Payer: Self-pay

## 2020-05-13 ENCOUNTER — Ambulatory Visit (INDEPENDENT_AMBULATORY_CARE_PROVIDER_SITE_OTHER): Payer: Medicare Other | Admitting: Psychiatry

## 2020-05-13 ENCOUNTER — Encounter (HOSPITAL_COMMUNITY): Payer: Self-pay | Admitting: Psychiatry

## 2020-05-13 DIAGNOSIS — F4310 Post-traumatic stress disorder, unspecified: Secondary | ICD-10-CM
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
## 2020-05-19 ENCOUNTER — Encounter: Payer: Self-pay | Admitting: Family Medicine

## 2020-05-20 NOTE — Progress Notes (Deleted)
[place]   [phone]) [phone] [phone]) [phone]   Clinic Follow up Note   Patient Care Team: [name], MD as PCP - Almond Lint, DO as Consulting Physician (Neurology) 05/20/2020  CHIEF COMPLAINT: Follow-up iron and B12 deficiency anemia  DIAGNOSIS LIST: 1. Anemia- on 12/19/2017 ferritin 6 serum iron 19 TIBC 478, 4% transferrin saturationHgb 12.2 consistent with iron deficiency, began oral iron; ferritin on 04/14/18 <4, patient received IV Feraheme 04/28/18, 05/05/18, 05/15/2019  2.B12 on 10/13/27 low 164; pending GI eval. She reports history of B12 deficiency and injections while living in [place], none in last 3 years since relocating to [facility]. H/o gastric bypass surgery in 2014.  Begin monthly B12 injection 8/910/2020 (missed September 2022)  3. 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 G2P2  4. SLE - presented with butterfly rash in 2005, per historical note she was treated with plaquenil and prednisone but changed to plaquenil and methotrexate due to drug AE's. Positive ANA titer on 11/15/2016; lupus anticoagulant positive on 07/08/17, 07/22/17, and 05/01/18.The antiphospholipid antibody IgG 46.1 (<15); antiphospholipid antibody IgM 13.4 (<15); antiphospholipid antibody IgA 21(<15);beta-2 glycoprotein 1 antibody IgM19.4 (<15); beta-2 glycoprotein antibody IgG G 50.2 (<15).Followed by RheumatologistDr. Jacolyn Reedy [facility]  5. Hypercoagulation work up:On 12/19/2017 showed slightly elevated protein C activity to 181%(range 73-180%)and elevated factor VIII assay to 223%,elevated beta-2 glycoprotein IgG to 27,and elevated cardiolipin antibodies IgG, IgM, and IgA; otherwisehemoglobinopathy evaluation, AntithrombinIII,protein S activity;factor V Leiden, andprothrombin gene were normal  CURRENT THERAPY:  1.IV Feraheme 510 mg as needed 2. Oral ferrous sulfate,  takes 1-3 tabs daily  3. Apixaban 2.5 mg BID 4. B12 injection 1000 mcg monthlystarting 10/2018  INTERVAL HISTORY: [name] returns for follow-up as scheduled.  She was last seen by Dr. Burr Medico on 10/26/2019.   REVIEW OF SYSTEMS:   Constitutional: Denies fevers, chills or abnormal weight loss Eyes: Denies blurriness of vision Ears, nose, mouth, throat, and face: Denies mucositis or sore throat Respiratory: Denies cough, dyspnea or wheezes Cardiovascular: Denies palpitation, chest discomfort or lower extremity swelling Gastrointestinal:  Denies nausea, heartburn or change in bowel habits Skin: Denies abnormal skin rashes Lymphatics: Denies new lymphadenopathy or easy bruising Neurological:Denies numbness, tingling or new weaknesses Behavioral/Psych: Mood is stable, no new changes  All other systems were reviewed with the patient and are negative.  MEDICAL HISTORY:  Past Medical History:  Diagnosis Date  . Anemia   . Anxiety   . Asthma   . Chronic pain   . Congenital hip dislocation   . GERD (gastroesophageal reflux disease)   . Iron deficiency anemia 04/14/2018  . Kidney mass 2017  . Migraine   . Osteoporosis   . Pathological dislocation of shoulder joint, bilateral    congential  . PTSD (post-traumatic stress disorder)   . Sleep apnea   . Systemic lupus erythematosus ([place])     SURGICAL HISTORY: Past Surgical History:  Procedure Laterality Date  . CESAREAN SECTION  2005  . CESAREAN SECTION W/BTL  2010  . HIP SURGERY     in childhood for congenital hip dislocation  . [place] RESECTION  2014  . STAPEDECTOMY  11/01/2011   L postauricular stapedectomy with CO2 laser and insertion of 6 x 4.75 mm SMart Piston  . TOTAL HIP ARTHROPLASTY     05/2000 R, 11/2000 L    I have reviewed the social history and family history with the patient and they  are unchanged from previous note.  ALLERGIES:  is allergic to fish allergy.  MEDICATIONS:  Current Outpatient  Medications  Medication Sig Dispense Refill  . acetaminophen (TYLENOL) 500 MG tablet Take 500 mg by mouth every 6 (six) hours as needed for headache.    . albuterol (PROVENTIL) (2.5 MG/3ML) 0.083% nebulizer solution Take 3 mLs (2.5 mg total) by nebulization every 6 (six) hours as needed for wheezing or shortness of breath. 150 mL 1  . apixaban (ELIQUIS) 5 MG TABS tablet Take 5 mg by mouth 2 (two) times daily.    . cyanocobalamin (,VITAMIN B-12,) 1000 MCG/ML injection Inject 1 mL (1,000 mcg total) into the skin every 30 (thirty) days. 5 mL 0  . cycloSPORINE (RESTASIS) 0.05 % ophthalmic emulsion Place 1 drop into both eyes 2 (two) times daily. 0.4 mL 0  . Erenumab-aooe (AIMOVIG) 140 MG/ML SOAJ Inject 140 mg into the skin every 30 (thirty) days. 1.12 mL 5  . ferrous sulfate 325 (65 FE) MG tablet Take 325 mg by mouth daily with breakfast.    . gabapentin (NEURONTIN) 100 MG capsule TAKE 3 CAPSULE EVERY NIGHT AT BEDTIME 90 capsule 3  . hydrOXYzine (VISTARIL) 50 MG capsule Take 1 capsule (50 mg total) by mouth at bedtime. 90 capsule 0  . naproxen (NAPROSYN) 500 MG tablet Take 500 mg by mouth 2 (two) times daily.    [name] Kitchen omeprazole (PRILOSEC) 40 MG capsule Take 1 capsule (40 mg total) by mouth daily. 30 capsule 2  . PARoxetine (PAXIL) 40 MG tablet Take 1 tablet (40 mg total) by mouth daily. 90 tablet 0  . PROAIR RESPICLICK 710 (90 Base) MCG/ACT AEPB INHALE 1 INHALATION BY  MOUTH INTO THE LUNGS EVERY  4 HOURS AS NEEDED FOR  WHEEZING, COUGHING 3 each 3  . SYRINGE/NEEDLE, DISP, 1 ML 25G X 5/8" 1 ML MISC Match with B12 3 each 3  . Ubrogepant (UBRELVY) 100 MG TABS Take 1 tablet by mouth as needed (May repeat in 2 hours.  Maximum 2 tablets in 24 hours). 10 tablet 5  . Vitamin D, Ergocalciferol, (DRISDOL) 50000 units CAPS capsule Take 1 capsule (50,000 Units total) by mouth every 7 (seven) days. 8 capsule 0   No current facility-administered medications for this visit.    PHYSICAL EXAMINATION: ECOG PERFORMANCE  STATUS: {CHL ONC ECOG PS:[phone]}  There were no vitals filed for this visit. There were no vitals filed for this visit.  GENERAL:alert, no distress and comfortable SKIN: skin color, texture, turgor are normal, no rashes or significant lesions EYES: normal, Conjunctiva are pink and non-injected, sclera clear OROPHARYNX:no exudate, no erythema and lips, buccal mucosa, and tongue normal  NECK: supple, thyroid normal size, non-tender, without nodularity LYMPH:  no palpable lymphadenopathy in the cervical, axillary or inguinal LUNGS: clear to auscultation and percussion with normal breathing effort HEART: regular rate & rhythm and no murmurs and no lower extremity edema ABDOMEN:abdomen soft, non-tender and normal bowel sounds Musculoskeletal:no cyanosis of digits and no clubbing  NEURO: alert & oriented x 3 with fluent speech, no focal motor/sensory deficits  LABORATORY DATA:  I have reviewed the data as listed CBC Latest Ref Rng & Units 10/26/2019 07/27/2019 04/30/2019  WBC 4.0 - 10.5 K/uL 7.2 7.2 7.5  Hemoglobin 12.0 - 15.0 g/dL 13.8 14.2 14.1  Hematocrit 36.0 - 46.0 % 42.9 43.0 42.8  Platelets 150 - 400 K/uL 378 293 303     CMP Latest Ref Rng & Units 10/26/2019 07/27/2019 04/30/2019  Glucose 70 - 99  mg/dL 86 123(H) 91  BUN 6 - 20 mg/dL 5(L) 8 7  Creatinine 0.44 - 1.00 mg/dL 0.65 0.64 0.61  Sodium 135 - 145 mmol/L 139 139 140  Potassium 3.5 - 5.1 mmol/L 4.1 3.7 3.9  Chloride 98 - 111 mmol/L 104 107 105  CO2 22 - 32 mmol/L 24 23 24   Calcium 8.9 - 10.3 mg/dL 9.7 9.3 8.7(L)  Total Protein 6.5 - 8.1 g/dL 8.1 8.0 7.6  Total Bilirubin 0.3 - 1.2 mg/dL 0.3 0.3 0.2(L)  Alkaline Phos 38 - 126 U/L 117 73 58  AST 15 - 41 U/L 9(L) 12(L) 13(L)  ALT 0 - 44 U/L 9 13 15       RADIOGRAPHIC STUDIES: I have personally reviewed the radiological images as listed and agreed with the findings in the report. No results found.   ASSESSMENT & PLAN:  No problem-specific Assessment & Plan notes found  for this encounter.   No orders of the defined types were placed in this encounter.  All questions were answered. The patient knows to call the clinic with any problems, questions or concerns. No barriers to learning was detected. I spent {CHL ONC TIME VISIT - GEFUW:[id number]} counseling the patient face to face. The total time spent in the appointment was {CHL ONC TIME VISIT - OUZHQ:[id number]} and more than 50% was on counseling and review of test results     Alla Feeling, NP 05/20/20

## 2020-05-21 ENCOUNTER — Telehealth: Payer: Self-pay | Admitting: Nurse Practitioner

## 2020-05-21 ENCOUNTER — Other Ambulatory Visit: Payer: Medicare Other

## 2020-05-21 ENCOUNTER — Ambulatory Visit: Payer: Medicare Other | Admitting: Nurse Practitioner

## 2020-05-21 NOTE — Telephone Encounter (Signed)
Rescheduled 03/16 appointment to 03/28 per patient's request.

## 2020-05-27 ENCOUNTER — Ambulatory Visit (INDEPENDENT_AMBULATORY_CARE_PROVIDER_SITE_OTHER): Payer: Medicare Other | Admitting: Psychiatry

## 2020-05-27 ENCOUNTER — Encounter (HOSPITAL_COMMUNITY): Payer: Self-pay | Admitting: Psychiatry

## 2020-05-27 ENCOUNTER — Other Ambulatory Visit: Payer: Self-pay

## 2020-05-27 DIAGNOSIS — F331 Major depressive disorder, recurrent, moderate: Secondary | ICD-10-CM

## 2020-05-27 DIAGNOSIS — F4310 Post-traumatic stress disorder, unspecified: Secondary | ICD-10-CM

## 2020-05-27 NOTE — Progress Notes (Signed)
Virtual Visit via Video Note  I connected with Melissa Gaines on 05/27/20 at  1:30 PM EDT by a video enabled telemedicine application and verified that I am speaking with the correct person using two identifiers.  Location: Patient: Patient Home Provider: Home Office  I discussed the limitations of evaluation and management by telemedicine and the availability of in person appointments. The patient expressed understanding and agreed to proceed.  History of Present Illness: MDD  Treatment Plan Goals: 1)REVISED 02/18/20:To reduce stress by improving effectiveness of parenting skills and strategies through consistent and proper applicationand to process trauma related to her parent-child experiences growing up.  2)To decrease depressive symptoms by addressing pain management with providers, by applying CBT skills and through processing thoughts and feelings in session.  Observations/Objective: Counselor met with Client for individual therapy via Webex. Counselor assessed MH symptoms and progress on treatment plan goals, with patient reportingthat she is maintaining and "hanging in there". Counselor explored meaning of statement with client identifying specific stressors. Client presents withmoderatedepression and mildanxiety. Client denied suicidal ideation or self-harm behaviors.   Counselor assessed progress on goals 1 and 2, with client sharing updates on son's problematic behavior of fire setting. Client reports that through intervention and a wrap around approach the issue has seized. Counselor praised the Client for her call to action and to ask for support by appropriate entities to eliminate safety issues. Counselor highlighted Client's strengths and positive parenting intentions. Client shared about her parenting philosophies and how they are rooted in her negative experiences as a child. Client shared updates on physical health and stress management  skills. Client states things are manageable at this time. Counselor reviewed penetrative behaviors to decrease symptoms. Client is taking medications as prescribed 7 out of 7 days.   Assessment and Plan: Counselor will continue to meet with patient to address treatment plan goals. Patient will continue to follow recommendations of providers and implement skills learned in session.  Follow Up Instructions: Counselor will send information for next session via Webex.   The patient was advised to call back or seek an in-person evaluation if the symptoms worsen or if the condition fails to improve as anticipated.  I provided3mnutes of non-face-to-face time during this encounter.   BLise Auer LCSW

## 2020-06-01 NOTE — Progress Notes (Signed)
[place]   Telephone:(336) [phone] Fax:(336) [phone]   Clinic Follow up Note   Patient Care Team: [name], MD as PCP - Almond Lint, DO as Consulting Physician (Neurology) 06/02/2020  CHIEF COMPLAINT: Follow up iron and B12 deficiency anemia   Diagnosis list : 1. Anemia- on 12/19/2017 ferritin 6 serum iron 19 TIBC 478, 4% transferrin saturationHgb 12.2 consistent with iron deficiency, began oral iron; ferritin on 04/14/18 <4, patient received IV Feraheme weekly x2 on 04/28/18 and 05/05/18   2.B12 on 10/13/27 low 164; pending GI eval. She reports history of B12 deficiency and injections while living in [place], none in last 3 years since relocating to [facility]. H/o gastric bypass surgery in 2014  3. 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 G2P2  4. SLE - presented with butterfly rash in 2005, per historical note she was treated with plaquenil and prednisone but changed to plaquenil and methotrexate due to drug AE's. Positive ANA titer on 11/15/2016; lupus anticoagulant positive on 07/08/17, 07/22/17, and 05/01/18.The antiphospholipid antibody IgG 46.1 (<15); antiphospholipid antibody IgM 13.4 (<15); antiphospholipid antibody IgA 21(<15);beta-2 glycoprotein 1 antibody IgM19.4 (<15); beta-2 glycoprotein antibody IgG G 50.2 (<15).Followed by RheumatologistDr. Jacolyn Reedy [facility]  5. Hypercoagulation work up:On 12/19/2017 showed slightly elevated protein C activity to 181%(range 73-180%)and elevated factor VIII assay to 223%,elevated beta-2 glycoprotein IgG to 27,and elevated cardiolipin antibodies IgG, IgM, and IgA; otherwisehemoglobinopathy evaluation, AntithrombinIII,protein S activity;factor V Leiden, andprothrombin gene were normal  CURRENT THERAPY:  1.IV Feraheme 510 mg PRN goal ferritin >50, last given 05/15/19  2. Oral ferrous sulfate, takes 1-3 tabs daily   3. Apixaban 2.5 mg BID 4. B12 injection 1000 mcg monthlystarting 10/2018  INTERVAL HISTORY: [name] returns for follow up as scheduled. She was last seen by Dr. Burr Medico on 10/26/19. She continues oral iron 2-3 times daily and monthly B12 injections but did not get it in Feb, last inj 03/13/20.  She is doing well overall.  She continues to have menses sometimes heavy but occurring more irregular now.  Denies other bleeding.  She ran out of her asthma medicine, she has exertional dyspnea at baseline.  For the past 6 years she reports a sharp sternal chest pain that occurs randomly, with coughing, occasionally at rest.  This is nonexertional.  This has not changed in a long period of time.  She does have reflux that improves with milk or Tums, she thinks this is not related.  Denies recent asthma exacerbation, infection, signs of thrombosis, or other changes.   MEDICAL HISTORY:  Past Medical History:  Diagnosis Date  . Anemia   . Anxiety   . Asthma   . Chronic pain   . Congenital hip dislocation   . GERD (gastroesophageal reflux disease)   . Iron deficiency anemia 04/14/2018  . Kidney mass 2017  . Migraine   . Osteoporosis   . Pathological dislocation of shoulder joint, bilateral    congential  . PTSD (post-traumatic stress disorder)   . Sleep apnea   . Systemic lupus erythematosus ([place])     SURGICAL HISTORY: Past Surgical History:  Procedure Laterality Date  . CESAREAN SECTION  2005  . CESAREAN SECTION W/BTL  2010  . HIP SURGERY     in childhood for congenital hip dislocation  . [place] RESECTION  2014  . STAPEDECTOMY  11/01/2011   L postauricular stapedectomy with CO2 laser and insertion of 6  x 4.75 mm SMart Piston  . TOTAL HIP ARTHROPLASTY     05/2000 R, 11/2000 L    I have reviewed the social history and family history with the patient and they are unchanged from previous note.  ALLERGIES:  is allergic to fish allergy.  MEDICATIONS:  Current Outpatient  Medications  Medication Sig Dispense Refill  . acetaminophen (TYLENOL) 500 MG tablet Take 500 mg by mouth every 6 (six) hours as needed for headache.    . albuterol (PROVENTIL) (2.5 MG/3ML) 0.083% nebulizer solution Take 3 mLs (2.5 mg total) by nebulization every 6 (six) hours as needed for wheezing or shortness of breath. 150 mL 1  . apixaban (ELIQUIS) 5 MG TABS tablet Take 5 mg by mouth 2 (two) times daily.    . cyanocobalamin (,VITAMIN B-12,) 1000 MCG/ML injection Inject 1 mL (1,000 mcg total) into the skin every 30 (thirty) days. 5 mL 0  . cycloSPORINE (RESTASIS) 0.05 % ophthalmic emulsion Place 1 drop into both eyes 2 (two) times daily. 0.4 mL 0  . Erenumab-aooe (AIMOVIG) 140 MG/ML SOAJ Inject 140 mg into the skin every 30 (thirty) days. 1.12 mL 5  . ferrous sulfate 325 (65 FE) MG tablet Take 325 mg by mouth daily with breakfast.    . gabapentin (NEURONTIN) 100 MG capsule TAKE 3 CAPSULE EVERY NIGHT AT BEDTIME 90 capsule 3  . hydrOXYzine (VISTARIL) 50 MG capsule Take 1 capsule (50 mg total) by mouth at bedtime. 90 capsule 0  . naproxen (NAPROSYN) 500 MG tablet Take 500 mg by mouth 2 (two) times daily.    [name] Kitchen omeprazole (PRILOSEC) 40 MG capsule Take 1 capsule (40 mg total) by mouth daily. 30 capsule 2  . PARoxetine (PAXIL) 40 MG tablet Take 1 tablet (40 mg total) by mouth daily. 90 tablet 0  . PROAIR RESPICLICK 977 (90 Base) MCG/ACT AEPB INHALE 1 INHALATION BY  MOUTH INTO THE LUNGS EVERY  4 HOURS AS NEEDED FOR  WHEEZING, COUGHING 3 each 3  . SYRINGE/NEEDLE, DISP, 1 ML 25G X 5/8" 1 ML MISC Match with B12 3 each 3  . Ubrogepant (UBRELVY) 100 MG TABS Take 1 tablet by mouth as needed (May repeat in 2 hours.  Maximum 2 tablets in 24 hours). 10 tablet 5  . Vitamin D, Ergocalciferol, (DRISDOL) 50000 units CAPS capsule Take 1 capsule (50,000 Units total) by mouth every 7 (seven) days. 8 capsule 0   No current facility-administered medications for this visit.   Facility-Administered Medications Ordered  in Other Visits  Medication Dose Route Frequency Provider Last Rate Last Admin  . cyanocobalamin ((VITAMIN B-12)) injection 1,000 mcg  1,000 mcg Intramuscular Q30 days Truitt Merle, MD   1,000 mcg at 06/02/20 1058    PHYSICAL EXAMINATION:  Vitals:   06/02/20 0959  BP: 131/77  Pulse: 88  Resp: 20  Temp: 97.8 F (36.6 C)  SpO2: 99%   Filed Weights   06/02/20 0959  Weight: 218 lb 14.4 oz (99.3 kg)    GENERAL:alert, no distress and comfortable SKIN: No rash EYES:  sclera clear LUNGS: clear with normal breathing effort HEART: regular rate & rhythm, no lower extremity edema ABDOMEN:abdomen soft, non-tender and normal bowel sounds NEURO: alert & oriented x 3 with fluent speech, no focal motor/sensory deficits  LABORATORY DATA:  I have reviewed the data as listed CBC Latest Ref Rng & Units 06/02/2020 10/26/2019 07/27/2019  WBC 4.0 - 10.5 K/uL 8.0 7.2 7.2  Hemoglobin 12.0 - 15.0 g/dL 13.6 13.8 14.2  Hematocrit  36.0 - 46.0 % 41.9 42.9 43.0  Platelets 150 - 400 K/uL 318 378 293     CMP Latest Ref Rng & Units 06/02/2020 10/26/2019 07/27/2019  Glucose 70 - 99 mg/dL 90 86 123(H)  BUN 6 - 20 mg/dL 7 5(L) 8  Creatinine 0.44 - 1.00 mg/dL 0.62 0.65 0.64  Sodium 135 - 145 mmol/L 140 139 139  Potassium 3.5 - 5.1 mmol/L 4.1 4.1 3.7  Chloride 98 - 111 mmol/L 105 104 107  CO2 22 - 32 mmol/L 23 24 23   Calcium 8.9 - 10.3 mg/dL 8.8(L) 9.7 9.3  Total Protein 6.5 - 8.1 g/dL 8.4(H) 8.1 8.0  Total Bilirubin 0.3 - 1.2 mg/dL 0.3 0.3 0.3  Alkaline Phos 38 - 126 U/L 74 117 73  AST 15 - 41 U/L 13(L) 9(L) 12(L)  ALT 0 - 44 U/L 15 9 13       RADIOGRAPHIC STUDIES: I have personally reviewed the radiological images as listed and agreed with the findings in the report. No results found.   ASSESSMENT & PLAN: 50 yo female with   1.Iron deficiencyanemia due to chronic blood loss from menorrhagia and malabsorption due to gastric bypass surgery -She takes oral iron 1-3 times daily which she tolerates  well, has required IV Feraheme infrequently to keep goal ferritin >50  -S/p IV Feraheme x2 in 04/2018. She initially responded well, but ferritin on 10/13/18 down to 16 and she became symptomatic with fatigue. Received IV Feraheme x1 on 11/02/2018, last given 05/15/2019 -Continue oral iron and IV Feraheme as needed to keep ferritin greater than 50  2.B12deficiency, secondary to previous gastric bypass surgery -8/7/20B12level decreased to 164with signs of peripheral neuropathy. -she began monthly B12 inj in 10/2018, her level is stable around 220 -continue monthly injections at [facility], her insurance does not cover home administration  3.SLE  - followed by Rheum at [facility]  4.Antiphospholipidsyndrome -continue eliquis, tolerating well without bleeding except irregular menses -Refills per PCP   5. Migraines -followed by Neurology  Dr. 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 appears stable.  She has no signs of bleeding, she is asymptomatic.  CBC is completely normal.  B12 is normal, ferritin is 35, she has evidence of recurrent iron deficiency.  I recommend 1 dose IV Feraheme this week and continue oral iron 2-3 times daily.  Continue B12 injections monthly, she will receive a dose today.  We will continue monitoring labs.   She has a 6-year history of sharp sternal chest pain and a history of asthma followed by PCP.  She has had no formal cardiology work-up.  She is interested in referral which I placed today.   She will return for lab in 3 in 6 months, follow-up in 6 months.   Orders Placed This Encounter  Procedures  . Ambulatory referral to Cardiology    Referral Priority:   Routine    Referral Type:   Consultation    Referral Reason:   Specialty Services Required    Requested Specialty:   Cardiology    Number  of Visits Requested:   1   All questions were answered. The patient knows to call the clinic with any problems, questions or concerns. No barriers to learning were detected. Total encounter time was 30 minutes.      Alla Feeling, NP 06/02/20

## 2020-06-02 ENCOUNTER — Other Ambulatory Visit: Payer: Medicare Other

## 2020-06-02 ENCOUNTER — Other Ambulatory Visit: Payer: Self-pay

## 2020-06-02 ENCOUNTER — Inpatient Hospital Stay (HOSPITAL_BASED_OUTPATIENT_CLINIC_OR_DEPARTMENT_OTHER): Payer: Medicare Other | Admitting: Nurse Practitioner

## 2020-06-02 ENCOUNTER — Ambulatory Visit: Payer: Medicare Other | Admitting: Nurse Practitioner

## 2020-06-02 ENCOUNTER — Inpatient Hospital Stay: Payer: Medicare Other | Attending: Nurse Practitioner

## 2020-06-02 ENCOUNTER — Encounter: Payer: Self-pay | Admitting: Nurse Practitioner

## 2020-06-02 ENCOUNTER — Ambulatory Visit: Payer: Medicare Other

## 2020-06-02 VITALS — BP 131/77 | HR 88 | Temp 97.8°F | Resp 20 | Ht <= 58 in | Wt 218.9 lb

## 2020-06-02 DIAGNOSIS — J45909 Unspecified asthma, uncomplicated: Secondary | ICD-10-CM | POA: Insufficient documentation

## 2020-06-02 DIAGNOSIS — E538 Deficiency of other specified B group vitamins: Secondary | ICD-10-CM | POA: Insufficient documentation

## 2020-06-02 DIAGNOSIS — M329 Systemic lupus erythematosus, unspecified: Secondary | ICD-10-CM | POA: Insufficient documentation

## 2020-06-02 DIAGNOSIS — Z9884 Bariatric surgery status: Secondary | ICD-10-CM | POA: Insufficient documentation

## 2020-06-02 DIAGNOSIS — G43909 Migraine, unspecified, not intractable, without status migrainosus: Secondary | ICD-10-CM | POA: Insufficient documentation

## 2020-06-02 DIAGNOSIS — Z79899 Other long term (current) drug therapy: Secondary | ICD-10-CM | POA: Insufficient documentation

## 2020-06-02 DIAGNOSIS — G4730 Sleep apnea, unspecified: Secondary | ICD-10-CM | POA: Insufficient documentation

## 2020-06-02 DIAGNOSIS — R0789 Other chest pain: Secondary | ICD-10-CM

## 2020-06-02 DIAGNOSIS — D508 Other iron deficiency anemias: Secondary | ICD-10-CM

## 2020-06-02 DIAGNOSIS — D6861 Antiphospholipid syndrome: Secondary | ICD-10-CM | POA: Insufficient documentation

## 2020-06-02 DIAGNOSIS — R072 Precordial pain: Secondary | ICD-10-CM | POA: Diagnosis not present

## 2020-06-02 DIAGNOSIS — D500 Iron deficiency anemia secondary to blood loss (chronic): Secondary | ICD-10-CM

## 2020-06-02 DIAGNOSIS — D509 Iron deficiency anemia, unspecified: Secondary | ICD-10-CM | POA: Diagnosis not present

## 2020-06-02 DIAGNOSIS — M810 Age-related osteoporosis without current pathological fracture: Secondary | ICD-10-CM | POA: Diagnosis not present

## 2020-06-02 DIAGNOSIS — K219 Gastro-esophageal reflux disease without esophagitis: Secondary | ICD-10-CM | POA: Insufficient documentation

## 2020-06-02 DIAGNOSIS — Z86718 Personal history of other venous thrombosis and embolism: Secondary | ICD-10-CM | POA: Insufficient documentation

## 2020-06-02 LAB — CMP (CANCER CENTER ONLY)
ALT: 15 U/L (ref 0–44)
AST: 13 U/L — ABNORMAL LOW (ref 15–41)
Albumin: 4 g/dL (ref 3.5–5.0)
Alkaline Phosphatase: 74 U/L (ref 38–126)
Anion gap: 12 (ref 5–15)
BUN: 7 mg/dL (ref 6–20)
CO2: 23 mmol/L (ref 22–32)
Calcium: 8.8 mg/dL — ABNORMAL LOW (ref 8.9–10.3)
Chloride: 105 mmol/L (ref 98–111)
Creatinine: 0.62 mg/dL (ref 0.44–1.00)
GFR, Estimated: >60 mL/min (ref >60)
Glucose, Bld: 90 mg/dL (ref 70–99)
Potassium: 4.1 mmol/L (ref 3.5–5.1)
Sodium: 140 mmol/L (ref 135–145)
Total Bilirubin: 0.3 mg/dL (ref 0.3–1.2)
Total Protein: 8.4 g/dL — ABNORMAL HIGH (ref 6.5–8.1)

## 2020-06-02 LAB — VITAMIN B12: Vitamin B-12: 215 pg/mL (ref 180–914)

## 2020-06-02 LAB — CBC WITH DIFFERENTIAL (CANCER CENTER ONLY)
Abs Immature Granulocytes: 0.03 10*3/uL (ref 0.00–0.07)
Basophils Absolute: 0.1 10*3/uL (ref 0.0–0.1)
Basophils Relative: 1 %
Eosinophils Absolute: 0.1 10*3/uL (ref 0.0–0.5)
Eosinophils Relative: 2 %
HCT: 41.9 % (ref 36.0–46.0)
Hemoglobin: 13.6 g/dL (ref 12.0–15.0)
Immature Granulocytes: 0 %
Lymphocytes Relative: 31 %
Lymphs Abs: 2.5 10*3/uL (ref 0.7–4.0)
MCH: 27.4 pg (ref 26.0–34.0)
MCHC: 32.5 g/dL (ref 30.0–36.0)
MCV: 84.5 fL (ref 80.0–100.0)
Monocytes Absolute: 0.4 10*3/uL (ref 0.1–1.0)
Monocytes Relative: 5 %
Neutro Abs: 5 10*3/uL (ref 1.7–7.7)
Neutrophils Relative %: 61 %
Platelet Count: 318 10*3/uL (ref 150–400)
RBC: 4.96 MIL/uL (ref 3.87–5.11)
RDW: 15.5 % (ref 11.5–15.5)
WBC Count: 8 10*3/uL (ref 4.0–10.5)
nRBC: 0 % (ref 0.0–0.2)

## 2020-06-02 LAB — IRON AND TIBC
Iron: 38 ug/dL — ABNORMAL LOW (ref 41–142)
Saturation Ratios: 9 % — ABNORMAL LOW (ref 21–57)
TIBC: 438 ug/dL (ref 236–444)
UIBC: 400 ug/dL — ABNORMAL HIGH (ref 120–384)

## 2020-06-02 LAB — FERRITIN: Ferritin: 35 ng/mL (ref 11–307)

## 2020-06-02 MED ORDER — CYANOCOBALAMIN 1000 MCG/ML IJ SOLN
INTRAMUSCULAR | Status: AC
  Filled 2020-06-02: qty 1

## 2020-06-02 MED ORDER — CYANOCOBALAMIN 1000 MCG/ML IJ SOLN
1000.0000 ug | INTRAMUSCULAR | Status: DC
  Administered 2020-06-02: 1000 ug via INTRAMUSCULAR

## 2020-06-02 NOTE — Patient Instructions (Signed)

## 2020-06-06 ENCOUNTER — Ambulatory Visit: Payer: Medicare Other

## 2020-06-06 ENCOUNTER — Telehealth: Payer: Self-pay | Admitting: Hematology

## 2020-06-06 LAB — METHYLMALONIC ACID, SERUM: Methylmalonic Acid, Quantitative: 129 nmol/L (ref 0–378)

## 2020-06-06 NOTE — Telephone Encounter (Signed)
Left message with follow-up appointment. Gave option to call back to reschedule if needed. °

## 2020-06-10 ENCOUNTER — Other Ambulatory Visit: Payer: Self-pay

## 2020-06-10 ENCOUNTER — Ambulatory Visit (INDEPENDENT_AMBULATORY_CARE_PROVIDER_SITE_OTHER): Payer: Medicare Other | Admitting: Psychiatry

## 2020-06-10 ENCOUNTER — Encounter (HOSPITAL_COMMUNITY): Payer: Self-pay | Admitting: Psychiatry

## 2020-06-10 DIAGNOSIS — F4310 Post-traumatic stress disorder, unspecified: Secondary | ICD-10-CM

## 2020-06-10 DIAGNOSIS — F331 Major depressive disorder, recurrent, moderate: Secondary | ICD-10-CM

## 2020-06-10 NOTE — Progress Notes (Signed)
Virtual Visit via Video Note  I connected with [name] on 06/10/20 at  3:30 PM EDT by a video enabled telemedicine application and verified that I am speaking with the correct person using two identifiers.  Location: Patient: Patient Home Provider: Home Office  I discussed the limitations of evaluation and management by telemedicine and the availability of in person appointments. The patient expressed understanding and agreed to proceed.  History of Present Illness: MDD  Treatment Plan Goals: 1)REVISED 02/18/20:To reduce stress by improving effectiveness of parenting skills and strategies through consistent and proper applicationand to process trauma related to her parent-child experiences growing up.  2)To decrease depressive symptoms by addressing pain management with providers, by applying CBT skills and through processing thoughts and feelings in session.  Observations/Objective: Counselor met with Client for individual therapy via Webex. Counselor assessed MH symptoms and progress on treatment plan goals, with patient reportingthatshe is maintaining and "hanging in there". Counselor explored meaning of statement with client identifying specific stressors. Client presents withmoderatedepression and moderateanxiety. Client denied suicidal ideation or self-harm behaviors.  Counselor assessed progress on goals 1 and 2 using MI and CBT interventions. Client reports taking medications as prescribed 7 out of 7 days. Client shared updates regarding parenting strategies, feeling better about mental health needs of son and school engagement of daughter. Client shared that she has been having flashbacks about her mother's parenting and life decisions, as to how it has impacted her negatively over the years. Counselor explored flashbacks and proceeding thoughts and feelings. Client discussed fears of being lonely and alone after kids launch from family.  Counselor discussed safety issues and dating strategies, considering traumatic history with partners. Client identified core beliefs and values she uses to guide her decision making. Client to communicate those needs with potential partner.   Assessment and Plan: Counselor will continue to meet with patient to address treatment plan goals. Patient will continue to follow recommendations of providers and implement skills learned in session.  Follow Up Instructions: Counselor will send information for next session via Webex.   The patient was advised to call back or seek an in-person evaluation if the symptoms worsen or if the condition fails to improve as anticipated.  I provided80mnutes of non-face-to-face time during this encounter.   BLise Auer LCSW

## 2020-06-12 ENCOUNTER — Encounter: Payer: Self-pay | Admitting: Family Medicine

## 2020-06-13 ENCOUNTER — Other Ambulatory Visit: Payer: Self-pay

## 2020-06-13 ENCOUNTER — Inpatient Hospital Stay: Payer: Medicare Other | Attending: Nurse Practitioner

## 2020-06-13 VITALS — BP 122/72 | HR 73 | Temp 98.0°F | Resp 18

## 2020-06-13 DIAGNOSIS — Z79899 Other long term (current) drug therapy: Secondary | ICD-10-CM | POA: Insufficient documentation

## 2020-06-13 DIAGNOSIS — D509 Iron deficiency anemia, unspecified: Secondary | ICD-10-CM | POA: Diagnosis not present

## 2020-06-13 DIAGNOSIS — D508 Other iron deficiency anemias: Secondary | ICD-10-CM

## 2020-06-13 DIAGNOSIS — E538 Deficiency of other specified B group vitamins: Secondary | ICD-10-CM | POA: Diagnosis not present

## 2020-06-13 MED ORDER — SODIUM CHLORIDE 0.9 % IV SOLN
510.0000 mg | Freq: Once | INTRAVENOUS | Status: AC
  Administered 2020-06-13: 510 mg via INTRAVENOUS
  Filled 2020-06-13: qty 510

## 2020-06-13 MED ORDER — SODIUM CHLORIDE 0.9 % IV SOLN
Freq: Once | INTRAVENOUS | Status: AC
  Filled 2020-06-13: qty 250

## 2020-06-13 NOTE — Patient Instructions (Signed)
Ferumoxytol injection What is this medicine? FERUMOXYTOL is an iron complex. Iron is used to make healthy red blood cells, which carry oxygen and nutrients throughout the body. This medicine is used to treat iron deficiency anemia. This medicine may be used for other purposes; ask your health care provider or pharmacist if you have questions. COMMON BRAND NAME(S): Feraheme What should I tell my health care provider before I take this medicine? They need to know if you have any of these conditions:  anemia not caused by low iron levels  high levels of iron in the blood  magnetic resonance imaging (MRI) test scheduled  an unusual or allergic reaction to iron, other medicines, foods, dyes, or preservatives  pregnant or trying to get pregnant  breast-feeding How should I use this medicine? This medicine is for injection into a vein. It is given by a health care professional in a hospital or clinic setting. Talk to your pediatrician regarding the use of this medicine in children. Special care may be needed. Overdosage: If you think you have taken too much of this medicine contact a poison control center or emergency room at once. NOTE: This medicine is only for you. Do not share this medicine with others. What if I miss a dose? It is important not to miss your dose. Call your doctor or health care professional if you are unable to keep an appointment. What may interact with this medicine? This medicine may interact with the following medications:  other iron products This list may not describe all possible interactions. Give your health care provider a list of all the medicines, herbs, non-prescription drugs, or dietary supplements you use. Also tell them if you smoke, drink alcohol, or use illegal drugs. Some items may interact with your medicine. What should I watch for while using this medicine? Visit your doctor or healthcare professional regularly. Tell your doctor or healthcare  professional if your symptoms do not start to get better or if they get worse. You may need blood work done while you are taking this medicine. You may need to follow a special diet. Talk to your doctor. Foods that contain iron include: whole grains/cereals, dried fruits, beans, or peas, leafy green vegetables, and organ meats (liver, kidney). What side effects may I notice from receiving this medicine? Side effects that you should report to your doctor or health care professional as soon as possible:  allergic reactions like skin rash, itching or hives, swelling of the face, lips, or tongue  breathing problems  changes in blood pressure  feeling faint or lightheaded, falls  fever or chills  flushing, sweating, or hot feelings  swelling of the ankles or feet Side effects that usually do not require medical attention (report to your doctor or health care professional if they continue or are bothersome):  diarrhea  headache  nausea, vomiting  stomach pain This list may not describe all possible side effects. Call your doctor for medical advice about side effects. You may report side effects to FDA at 1-800-FDA-1088. Where should I keep my medicine? This drug is given in a hospital or clinic and will not be stored at home. NOTE: This sheet is a summary. It may not cover all possible information. If you have questions about this medicine, talk to your doctor, pharmacist, or health care provider.  2021 Elsevier/Gold Standard (2016-04-12 20:21:10)  

## 2020-07-03 ENCOUNTER — Other Ambulatory Visit: Payer: Self-pay

## 2020-07-03 ENCOUNTER — Inpatient Hospital Stay: Payer: Medicare Other | Attending: Nurse Practitioner

## 2020-07-03 DIAGNOSIS — D508 Other iron deficiency anemias: Secondary | ICD-10-CM

## 2020-07-03 DIAGNOSIS — Z79899 Other long term (current) drug therapy: Secondary | ICD-10-CM | POA: Diagnosis not present

## 2020-07-03 DIAGNOSIS — E538 Deficiency of other specified B group vitamins: Secondary | ICD-10-CM | POA: Diagnosis not present

## 2020-07-03 MED ORDER — CYANOCOBALAMIN 1000 MCG/ML IJ SOLN
INTRAMUSCULAR | Status: AC
  Filled 2020-07-03: qty 1

## 2020-07-03 MED ORDER — CYANOCOBALAMIN 1000 MCG/ML IJ SOLN
1000.0000 ug | INTRAMUSCULAR | Status: DC
  Administered 2020-07-03: 1000 ug via INTRAMUSCULAR

## 2020-07-08 ENCOUNTER — Other Ambulatory Visit: Payer: Self-pay

## 2020-07-08 ENCOUNTER — Ambulatory Visit (INDEPENDENT_AMBULATORY_CARE_PROVIDER_SITE_OTHER): Payer: Medicare Other | Admitting: Psychiatry

## 2020-07-08 DIAGNOSIS — F4310 Post-traumatic stress disorder, unspecified: Secondary | ICD-10-CM | POA: Diagnosis not present

## 2020-07-08 DIAGNOSIS — F331 Major depressive disorder, recurrent, moderate: Secondary | ICD-10-CM

## 2020-07-09 ENCOUNTER — Encounter (HOSPITAL_COMMUNITY): Payer: Self-pay | Admitting: Psychiatry

## 2020-07-09 NOTE — Progress Notes (Signed)
Virtual Visit via Video Note  I connected with Melissa Gaines on 07/08/20 at  3:30 PM EDT by a video enabled telemedicine application and verified that I am speaking with the correct person using two identifiers.  Location: Patient: Patient Home Provider: Home Office  I discussed the limitations of evaluation and management by telemedicine and the availability of in person appointments. The patient expressed understanding and agreed to proceed.  History of Present Illness: MDD  Treatment Plan Goals: 1)REVISED 02/18/20:To reduce stress by improving effectiveness of parenting skills and strategies through consistent and proper applicationand to process trauma related to her parent-child experiences growing up.  2)To decrease depressive symptoms by addressing pain management with providers, by applying CBT skills and through processing thoughts and feelings in session.  Observations/Objective: Counselor met with Client for individual therapy via Webex. Counselor assessed MH symptoms and progress on treatment plan goals, with patient reportingthatshe was currently fatigued from attending in office appointmnets earlier in the day. Client presents withmoderatedepression and moderateanxiety. Client denied suicidal ideation or self-harm behaviors.  Counselor assessed progress on goals 1 and 2 using MI and CBT interventions. Client continues reports taking medications as prescribed 7 out of 7 days, no current issues. Client discussed parenting challenges related to financial status and fixed income. Counselor shared and provided a variety of resources for Client to connect with to addressing housing, need for ramp, wheel chair and accessible features added to the home, due to her declining health. Client shared about past barriers and how if available, how resources could impact overall functioning and ability to manage pain, physical and mental health symptoms. Client  reports feeling more optimistic in looking into resources. Client reports no current parenting concerns at this time. Counselor validated and celebrated her efforts in positive parenting and life management.  Assessment and Plan: Counselor will continue to meet with patient to address treatment plan goals. Patient will continue to follow recommendations of providers and implement skills learned in session.  Follow Up Instructions: Counselor will send information for next session via Webex.   The patient was advised to call back or seek an in-person evaluation if the symptoms worsen or if the condition fails to improve as anticipated.  I provided40 minutes of non-face-to-face time during this encounter.   Lise Auer, LCSW

## 2020-07-10 DIAGNOSIS — M329 Systemic lupus erythematosus, unspecified: Secondary | ICD-10-CM | POA: Diagnosis not present

## 2020-07-10 DIAGNOSIS — R760 Raised antibody titer: Secondary | ICD-10-CM | POA: Diagnosis not present

## 2020-07-10 DIAGNOSIS — Z79899 Other long term (current) drug therapy: Secondary | ICD-10-CM | POA: Diagnosis not present

## 2020-07-10 DIAGNOSIS — M2550 Pain in unspecified joint: Secondary | ICD-10-CM | POA: Diagnosis not present

## 2020-07-21 ENCOUNTER — Ambulatory Visit: Payer: Medicare Other | Admitting: Cardiology

## 2020-07-22 ENCOUNTER — Ambulatory Visit (INDEPENDENT_AMBULATORY_CARE_PROVIDER_SITE_OTHER): Payer: Medicare Other | Admitting: Family Medicine

## 2020-07-22 ENCOUNTER — Encounter: Payer: Self-pay | Admitting: Family Medicine

## 2020-07-22 ENCOUNTER — Other Ambulatory Visit: Payer: Self-pay

## 2020-07-22 VITALS — BP 120/80 | HR 92 | Ht <= 58 in | Wt 217.4 lb

## 2020-07-22 DIAGNOSIS — E785 Hyperlipidemia, unspecified: Secondary | ICD-10-CM

## 2020-07-22 DIAGNOSIS — Z0000 Encounter for general adult medical examination without abnormal findings: Secondary | ICD-10-CM | POA: Diagnosis not present

## 2020-07-22 DIAGNOSIS — R310 Gross hematuria: Secondary | ICD-10-CM

## 2020-07-22 DIAGNOSIS — H9312 Tinnitus, left ear: Secondary | ICD-10-CM | POA: Diagnosis not present

## 2020-07-22 DIAGNOSIS — Z7901 Long term (current) use of anticoagulants: Secondary | ICD-10-CM | POA: Diagnosis not present

## 2020-07-22 DIAGNOSIS — M25551 Pain in right hip: Secondary | ICD-10-CM

## 2020-07-22 DIAGNOSIS — Z131 Encounter for screening for diabetes mellitus: Secondary | ICD-10-CM | POA: Diagnosis not present

## 2020-07-22 DIAGNOSIS — M545 Low back pain, unspecified: Secondary | ICD-10-CM | POA: Diagnosis not present

## 2020-07-22 DIAGNOSIS — S32399D Other fracture of unspecified ilium, subsequent encounter for fracture with routine healing: Secondary | ICD-10-CM | POA: Diagnosis not present

## 2020-07-22 DIAGNOSIS — Z124 Encounter for screening for malignant neoplasm of cervix: Secondary | ICD-10-CM

## 2020-07-22 DIAGNOSIS — Z1159 Encounter for screening for other viral diseases: Secondary | ICD-10-CM

## 2020-07-22 LAB — POCT GLYCOSYLATED HEMOGLOBIN (HGB A1C): Hemoglobin A1C: 5.5 % (ref 4.0–5.6)

## 2020-07-22 NOTE — Assessment & Plan Note (Signed)
Plan to obtain CMP, lipid panel.  A1c 5.6% appropriate, no DM.
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
## 2020-07-22 NOTE — Assessment & Plan Note (Signed)
Patient continues to be compliant with long-term anticoagulation with Eliquis

## 2020-07-22 NOTE — Assessment & Plan Note (Signed)
Patient has history of pelvic fracture.  Will obtain DEXA due to occult fracture.  She still having pain despite completing physical therapy.  Referred back to orthopedics as she was supposed to follow-up and get imaging with him.

## 2020-07-22 NOTE — Patient Instructions (Addendum)
It was a pleasure to see you today!  1. For back and groin pain: please call Dr. Junius Roads' office [phone]) to schedule a follow up appointment as you may need an injection.   2. For your hearing: I will refer you to an ear-nose-and-throat doctor (ENT), to look into your tinnitus and check on your existing hearing implant. You should get a phone call to schedule this in the next 1-2 weeks.   3. For pap smear: due in fall 2023.  4. For hip fracture: please call the breast center of [facility] to schedule a DEXA- or bone density scan  5. Kidney: please tell the urologist that you had a mass on your left kidney that was most likely a cyst. Please ask them about which imaging would be best to monitor this.   6. We will get some labs today.  If they are abnormal or we need to do something about them, I will call you.  If they are normal, I will send you a message on MyChart (if it is active) or a letter in the mail.  If you don't hear from [place] in 2 weeks, please call the office  [phone].  Be Well,  Dr. Chauncey Reading
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
## 2020-07-22 NOTE — Assessment & Plan Note (Addendum)
Patient has a history of deafness with a surgical implant for hearing on left side.  She reports the last month she has had decreased hearing and unilateral tinnitus on the left side.  No signs of carotid bruit.  Due to significant hearing and surgical history, recommend patient see ENT.  Her last ENT was in [place].  Will place ambulatory referral.  Normal neuro exam with no focal deficits.

## 2020-07-22 NOTE — Assessment & Plan Note (Addendum)
Patient continues to have lumbar back pain, has an MR I of lumbar spine from 2018 that shows disc herniation at L2-L3.  She is completed physical therapy with marked improvement, but with continued pain, recommend she follow-up with orthopedics.  Asked patient to make an appointment.

## 2020-07-22 NOTE — Assessment & Plan Note (Signed)
Patient has appointment with urology tomorrow.  She also has history of likely renal cyst, 6 mm seen on MRI of lumbar spine in 2018.  Patient was supposed to have a 6-month follow-up with imaging on this finding, which did not happen.  Gave her a copy of that report and asked her to [place] with urologist tomorrow.  I am happy to order imaging if necessary.

## 2020-07-23 DIAGNOSIS — N390 Urinary tract infection, site not specified: Secondary | ICD-10-CM | POA: Diagnosis not present

## 2020-07-23 LAB — LIPID PANEL
Chol/HDL Ratio: 4.5 ratio — ABNORMAL HIGH (ref 0.0–4.4)
Cholesterol, Total: 198 mg/dL (ref 100–199)
HDL: 44 mg/dL (ref >39)
LDL Chol Calc (NIH): 128 mg/dL — ABNORMAL HIGH (ref 0–99)
Triglycerides: 146 mg/dL (ref 0–149)
VLDL Cholesterol Cal: 26 mg/dL (ref 5–40)

## 2020-07-23 LAB — HCV INTERPRETATION

## 2020-07-23 LAB — HCV AB W REFLEX TO QUANT PCR: HCV Ab: <0.1 s/co ratio (ref 0.0–0.9)

## 2020-07-29 ENCOUNTER — Telehealth (INDEPENDENT_AMBULATORY_CARE_PROVIDER_SITE_OTHER): Payer: Medicare Other | Admitting: Psychiatry

## 2020-07-29 ENCOUNTER — Encounter (HOSPITAL_COMMUNITY): Payer: Self-pay | Admitting: Psychiatry

## 2020-07-29 ENCOUNTER — Other Ambulatory Visit: Payer: Self-pay

## 2020-07-29 VITALS — Wt 217.0 lb

## 2020-07-29 DIAGNOSIS — F331 Major depressive disorder, recurrent, moderate: Secondary | ICD-10-CM | POA: Diagnosis not present

## 2020-07-29 DIAGNOSIS — F4310 Post-traumatic stress disorder, unspecified: Secondary | ICD-10-CM

## 2020-07-29 MED ORDER — TRAZODONE HCL 100 MG PO TABS: 50.0000 mg | ORAL_TABLET | Freq: Every day | ORAL | 0 refills | Status: DC

## 2020-07-29 MED ORDER — PAROXETINE HCL 40 MG PO TABS: 40.0000 mg | ORAL_TABLET | Freq: Every day | ORAL | 0 refills | Status: DC
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
## 2020-08-01 ENCOUNTER — Inpatient Hospital Stay: Payer: Medicare Other | Attending: Nurse Practitioner

## 2020-08-01 ENCOUNTER — Other Ambulatory Visit: Payer: Self-pay

## 2020-08-01 DIAGNOSIS — D509 Iron deficiency anemia, unspecified: Secondary | ICD-10-CM | POA: Diagnosis not present

## 2020-08-01 DIAGNOSIS — D508 Other iron deficiency anemias: Secondary | ICD-10-CM

## 2020-08-01 DIAGNOSIS — E538 Deficiency of other specified B group vitamins: Secondary | ICD-10-CM | POA: Diagnosis not present

## 2020-08-01 MED ORDER — CYANOCOBALAMIN 1000 MCG/ML IJ SOLN
INTRAMUSCULAR | Status: AC
  Filled 2020-08-01: qty 1

## 2020-08-01 MED ORDER — CYANOCOBALAMIN 1000 MCG/ML IJ SOLN
1000.0000 ug | INTRAMUSCULAR | Status: DC
  Administered 2020-08-01: 1000 ug via INTRAMUSCULAR

## 2020-08-05 ENCOUNTER — Ambulatory Visit (INDEPENDENT_AMBULATORY_CARE_PROVIDER_SITE_OTHER): Payer: Medicare Other | Admitting: Cardiology

## 2020-08-05 ENCOUNTER — Encounter: Payer: Self-pay | Admitting: Cardiology

## 2020-08-05 VITALS — BP 128/82 | HR 78 | Ht <= 58 in | Wt 217.0 lb

## 2020-08-05 DIAGNOSIS — R072 Precordial pain: Secondary | ICD-10-CM | POA: Diagnosis not present

## 2020-08-05 DIAGNOSIS — R079 Chest pain, unspecified: Secondary | ICD-10-CM | POA: Diagnosis not present

## 2020-08-05 DIAGNOSIS — Z7189 Other specified counseling: Secondary | ICD-10-CM

## 2020-08-05 MED ORDER — METOPROLOL TARTRATE 50 MG PO TABS: ORAL_TABLET | ORAL | 0 refills | Status: DC

## 2020-08-05 NOTE — Patient Instructions (Addendum)
Medication Instructions:  Your Physician recommend you continue on your current medication as directed.    *If you need a refill on your cardiac medications before your next appointment, please call your pharmacy*   Lab Work: Your physician recommends that you return for lab work 1 week prior to test (BMP).  If you have labs (blood work) drawn today and your tests are completely normal, you will receive your results only by: [name] Kitchen MyChart Message (if you have MyChart) OR . A paper copy in the mail If you have any lab test that is abnormal or we need to change your treatment, we will call you to review the results.   Testing/Procedures: Cardiac CT Angiography (CTA), is a special type of CT scan that uses a computer to produce multi-dimensional views of major blood vessels throughout the body. In CT angiography, a contrast material is injected through an IV to help visualize the blood vessels [facility]   Follow-Up: At [facility], you and your health needs are our priority.  As part of our continuing mission to provide you with exceptional heart care, we have created designated Provider Care Teams.  These Care Teams include your primary Cardiologist (physician) and Advanced Practice Providers (APPs -  Physician Assistants and Nurse Practitioners) who all work together to provide you with the care you need, when you need it.  We recommend signing up for the patient portal called "MyChart".  Sign up information is provided on this After Visit Summary.  MyChart is used to connect with patients for Virtual Visits (Telemedicine).  Patients are able to view lab/test results, encounter notes, upcoming appointments, etc.  Non-urgent messages can be sent to your provider as well.   To learn more about what you can do with MyChart, go to [email or web address].    Your next appointment:   As needed  The format for your next appointment:   In Person  Provider:   Buford Dresser,  MD   Your cardiac CT will be scheduled at one of the below locations:   [facility] [address] [place], [place] [address] [phone]  If scheduled at [facility], please arrive at the [facility] main entrance (entrance A) of [facility] 30 minutes prior to test start time. Proceed to the [facility] Radiology Department (first floor) to check-in and test prep.  If scheduled at [facility], please arrive 15 mins early for check-in and test prep.  Please follow these instructions carefully (unless otherwise directed):   On the Night Before the Test: . Be sure to Drink plenty of water. . Do not consume any caffeinated/decaffeinated beverages or chocolate 12 hours prior to your test. . Do not take any antihistamines 12 hours prior to your test.   On the Day of the Test: . Drink plenty of water until 1 hour prior to the test. . Do not eat any food 4 hours prior to the test. . You may take your regular medications prior to the test.  . Take metoprolol (Lopressor) 50 mg two hours prior to test. . FEMALES- please wear underwire-free bra if available       After the Test: . Drink plenty of water. . After receiving IV contrast, you may experience a mild flushed feeling. This is normal. . On occasion, you may experience a mild rash up to 24 hours after the test. This is not dangerous. If this occurs, you can take Benadryl 25 mg and increase  your fluid intake. . If you experience trouble breathing, this can be serious. If it is severe call 911 IMMEDIATELY. If it is mild, please call our office. . If you take any of these medications: Glipizide/Metformin, Avandament, Glucavance, please do not take 48 hours after completing test unless otherwise instructed.   Once we have confirmed authorization from your insurance company, we will call you to set up a date and time for your test. Based on how quickly your insurance processes  prior authorizations requests, please allow up to 4 weeks to be contacted for scheduling your Cardiac CT appointment. Be advised that routine Cardiac CT appointments could be scheduled as many as 8 weeks after your provider has ordered it.  For non-scheduling related questions, please contact the cardiac imaging nurse navigator should you have any questions/concerns: Marchia Bond, Cardiac Imaging Nurse Navigator Gordy Clement, Cardiac Imaging Nurse Navigator [facility] Direct Office Dial: [phone]   For scheduling needs, including cancellations and rescheduling, please call [place], [phone].

## 2020-08-05 NOTE — Progress Notes (Signed)
Cardiology Office Note:    Date:  08/05/2020   ID:  Melissa Gaines, DOB [birth year], MRN [id number]  PCP:  [name], MD  Cardiologist:  Buford Dresser, MD  Referring MD: Alla Feeling, NP   No chief complaint on file. CC: Atypical chest pain.  History of Present Illness:    Melissa Gaines is a 50 y.o. female with a hx of anemia, anxiety, asthma, GERD, osteoporosis, PTSD, sleep apnea, and Systemic lupus erythematosus who is seen as a new consult at the request of Alla Feeling, NP for the evaluation and management of chest pain.  Note from 06/02/20 from [name], NP reviewed.  Today: Has a history of chest pain. Symptoms and risk factors below. No other concerns today.  Chest pain: -Initial onset: First episode 6-7 years ago, Episodes begin at night and at rest -Quality: Hervey Ard -Frequency: At night, 3-4 times since start of 2022 -Duration: 15-30 minutes -Associated symptoms: Chest tightness -Aggravating/alleviating factors: no clear factors -Prior cardiac history: none -Prior workup: Stress test with normal results -Prior treatment: none -Tobacco: Never smoker -Comorbidities: Lupus  -Exercise level: -Cardiac ROS: no shortness of breath, no PND, no orthopnea, no LE edema, no syncope -Family history: Her mother had a heart attack. Her father has kidney issues but no CV disease.   She has been having intermittent chest pains for a while (6-7 years). At night, she gets a sharp pain in her central chest with a duration of 15-30 minutes. She also experiences chest tightness during these episodes. Her episodes occur randomly and rarely, about 3-4 times this year so far. She is usually sitting or at rest, and there are no known warnings or triggers.  She has never been told that she had a heart attack, stroke, or hypertension. She notes her blood pressure is sometimes "on the low side". She did develop gestational diabetes during  her pregnancy in 2010, but this resolved after her pregnancy. She states her lupus is stable at this time, she is not currently on medication for this.   Her son was born in 2010, and at that time she was told she had a pulmonary embolism and was placed on a blood thinner.  She denies any shortness of breath, palpitations, or exertional symptoms. No headaches, lightheadedness, or syncope to report. Also has no lower extremity edema, orthopnea or PND. 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  She is amenable to having a CT Scan.  Past Medical History:  Diagnosis Date  . Anemia   . Anxiety   . Asthma   . Chronic pain   . Congenital hip dislocation   . GERD (gastroesophageal reflux disease)   . Iron deficiency anemia 04/14/2018  . Kidney mass 2017  . Migraine   . Osteoporosis   . Pathological dislocation of shoulder joint, bilateral    congential  . PTSD (post-traumatic stress disorder)   . Sleep apnea   . Systemic lupus erythematosus ([place])     Past Surgical History:  Procedure Laterality Date  . CESAREAN SECTION  2005  . CESAREAN SECTION W/BTL  2010  . HIP SURGERY     in childhood for congenital hip dislocation  . [place] RESECTION  2014  . STAPEDECTOMY  11/01/2011   L postauricular stapedectomy with CO2 laser and insertion of 6 x 4.75 mm SMart Piston  . TOTAL HIP ARTHROPLASTY     05/2000 R, 11/2000 L    Current Medications: Current Outpatient Medications on  File Prior to Visit  Medication Sig  . acetaminophen (TYLENOL) 500 MG tablet Take 500 mg by mouth every 6 (six) hours as needed for headache.  . albuterol (PROVENTIL) (2.5 MG/3ML) 0.083% nebulizer solution Take 3 mLs (2.5 mg total) by nebulization every 6 (six) hours as needed for wheezing or shortness of breath.  [name] Kitchen apixaban (ELIQUIS) 5 MG TABS tablet Take 5 mg by mouth 2 (two) times daily.  . cyanocobalamin (,VITAMIN B-12,) 1000 MCG/ML injection Inject 1 mL (1,000 mcg total) into the skin every 30 (thirty) days.   . cycloSPORINE (RESTASIS) 0.05 % ophthalmic emulsion Place 1 drop into both eyes 2 (two) times daily.  [name] (AIMOVIG) 140 MG/ML SOAJ Inject 140 mg into the skin every 30 (thirty) days.  . ferrous sulfate 325 (65 FE) MG tablet Take 325 mg by mouth daily with breakfast.  . gabapentin (NEURONTIN) 100 MG capsule TAKE 3 CAPSULE EVERY NIGHT AT BEDTIME  . hydrOXYzine (VISTARIL) 50 MG capsule Take 1 capsule (50 mg total) by mouth at bedtime. (Patient not taking: Reported on 07/29/2020)  . naproxen (NAPROSYN) 500 MG tablet Take 500 mg by mouth 2 (two) times daily.  [name] Kitchen omeprazole (PRILOSEC) 40 MG capsule Take 1 capsule (40 mg total) by mouth daily.  [name] Kitchen PARoxetine (PAXIL) 40 MG tablet Take 1 tablet (40 mg total) by mouth daily.  [name] Kitchen PROAIR RESPICLICK 024 (90 Base) MCG/ACT AEPB INHALE 1 INHALATION BY  MOUTH INTO THE LUNGS EVERY  4 HOURS AS NEEDED FOR  WHEEZING, COUGHING  . SYRINGE/NEEDLE, DISP, 1 ML 25G X 5/8" 1 ML MISC Match with B12  . traZODone (DESYREL) 100 MG tablet Take 0.5-1 tablets (50-100 mg total) by mouth at bedtime.  [name] Kitchen Ubrogepant (UBRELVY) 100 MG TABS Take 1 tablet by mouth as needed (May repeat in 2 hours.  Maximum 2 tablets in 24 hours).  . Vitamin D, Ergocalciferol, (DRISDOL) 50000 units CAPS capsule Take 1 capsule (50,000 Units total) by mouth every 7 (seven) days.   No current facility-administered medications on file prior to visit.     Allergies:   Fish allergy   Social History   Tobacco Use  . Smoking status: Never Smoker  . Smokeless tobacco: Never Used  Vaping Use  . Vaping Use: Never used  Substance Use Topics  . Alcohol use: Not Currently    Comment: when she was 49 years old; none after having children   . Drug use: No    Family History: family history includes Anxiety disorder in her mother and sister; Asthma in her mother and sister; Cancer in her maternal grandmother, paternal grandmother, and another family member; Depression in her mother and sister; Heart  Problems in her mother; Hernia in her father; Kidney Stones in her father and mother; Kidney disease in her father; Liver cancer in her mother; Osteoporosis in her mother; Stroke in her mother.  ROS:   Please see the history of present illness.  Additional pertinent ROS: Constitutional: Negative for chills, fever, night sweats, unintentional weight loss  HENT: Negative for ear pain and hearing loss.   Eyes: Negative for loss of vision and eye pain.  Respiratory: Negative for cough, sputum, wheezing.   Cardiovascular: See HPI. Gastrointestinal: Negative for abdominal pain, melena, and hematochezia.  Genitourinary: Negative for dysuria and hematuria.  Musculoskeletal: Negative for falls and myalgias.  Skin: Negative for itching and rash.  Neurological: Negative for focal weakness, focal sensory changes and loss of consciousness.  Endo/Heme/Allergies: Does not bruise/bleed easily.  EKGs/Labs/Other Studies Reviewed:    The following studies were reviewed today: No prior CV studies.  EKG:  EKG is personally reviewed.   08/05/2020: NSR at 78 bpm  Recent Labs: 06/02/2020: ALT 15; BUN 7; Creatinine 0.62; Hemoglobin 13.6; Platelet Count 318; Potassium 4.1; Sodium 140  Recent Lipid Panel    Component Value Date/Time   CHOL 198 07/22/2020 1112   TRIG 146 07/22/2020 1112   HDL 44 07/22/2020 1112   CHOLHDL 4.5 (H) 07/22/2020 1112   LDLCALC 128 (H) 07/22/2020 1112    Physical Exam:    VS:  BP 128/82 (BP Location: Right Arm, Patient Position: Sitting, Cuff Size: Large)   Pulse 78   Ht 4\' 10"  (1.473 m)   Wt 217 lb (98.4 kg)   LMP 07/15/2020   BMI 45.35 kg/m     Wt Readings from Last 3 Encounters:  08/05/20 217 lb (98.4 kg)  07/22/20 217 lb 6 oz (98.6 kg)  06/02/20 218 lb 14.4 oz (99.3 kg)    GEN: Well nourished, well developed in no acute distress HEENT: Normal, moist mucous membranes NECK: No JVD CARDIAC: regular rhythm, normal S1 and S2, no rubs or gallops. No  murmur. VASCULAR: Radial and DP pulses 2+ bilaterally. No carotid bruits RESPIRATORY:  Clear to auscultation without rales, wheezing or rhonchi  ABDOMEN: Soft, non-tender, non-distended MUSCULOSKELETAL:  Ambulates independently SKIN: Warm and dry, no edema NEUROLOGIC:  Alert and oriented x 3. No focal neuro deficits noted. PSYCHIATRIC:  Normal affect    ASSESSMENT:    1. Chest pain of uncertain etiology   2. Precordial pain   3. Cardiac risk counseling   4. Counseling on health promotion and disease prevention    PLAN:    Chest pain: We spent significant time today reviewing different parts of the cardiovascular system (electrical, vascular, functional, and valvular). We discussed how each of these systems can present with different symptoms. We reviewed that there are different ways we evaluate these symptoms with tests. We reviewed which tests I think are most appropriate given the symptoms, and we discussed risks/benefits and limitations of each of these tests. Please see summary below. We also discussed that if testing is unrevealing for a cardiac cause of the symptoms, there are many noncardiac causes as well that can contribute to symptoms. If the heart is ruled out, then I recommend returning to PCP to discuss alternative diagnoses. --discussed treadmill stress, nuclear stress/lexiscan, and CT coronary angiography. Discussed pros and cons of each, including but not limited to false positive/false negative risk, radiation risk, and risk of IV contrast dye. Based on shared decision making, decision was made to pursue CT coronary angiography. -will give one time dose of metoprolol 2 hours prior to scheduled test -counseled on need to get BMET prior to test -counseled on use of sublingual nitroglycerin and its importance to a good test  Cardiac risk counseling and prevention recommendations: -recommend heart healthy/Mediterranean diet, with whole grains, fruits, vegetable, fish, lean  meats, nuts, and olive oil. Limit salt. -recommend moderate walking, 3-5 times/week for 30-50 minutes each session. Aim for at least 150 minutes.week. Goal should be pace of 3 miles/hours, or walking 1.5 miles in 30 minutes -recommend avoidance of tobacco products. Avoid excess alcohol. -ASCVD risk score: The 10-year ASCVD risk score Mikey Bussing DC Jr., et al., 2013) is: 1.1%   Values used to calculate the score:     Age: 21 years     Sex: Female     Is Non-Hispanic African  American: No     Diabetic: No     Tobacco smoker: No     Systolic Blood Pressure: 263 mmHg     Is BP treated: No     HDL Cholesterol: 44 mg/dL     Total Cholesterol: 198 mg/dL    Plan for follow up: to be determined based on results of testing  Buford Dresser, MD, PhD, [place]    Medication Adjustments/Labs and Tests Ordered: Current medicines are reviewed at length with the patient today.  Concerns regarding medicines are outlined above.  Orders Placed This Encounter  Procedures  . CT CORONARY MORPH W/CTA COR W/SCORE W/CA W/CM &/OR WO/CM  . CT CORONARY FRACTIONAL FLOW RESERVE DATA PREP  . CT CORONARY FRACTIONAL FLOW RESERVE FLUID ANALYSIS  . Basic metabolic panel  . EKG 12-Lead   Meds ordered this encounter  Medications  . metoprolol tartrate (LOPRESSOR) 50 MG tablet    Sig: TAKE 1 TABLET 2 HR PRIOR TO CARDIAC PROCEDURE    Dispense:  1 tablet    Refill:  0    Patient Instructions  Medication Instructions:  Your Physician recommend you continue on your current medication as directed.    *If you need a refill on your cardiac medications before your next appointment, please call your pharmacy*   Lab Work: Your physician recommends that you return for lab work 1 week prior to test (BMP).  If you have labs (blood work) drawn today and your tests are completely normal, you will receive your results only by: [name] Kitchen MyChart Message (if you have MyChart) OR . A paper copy in the  mail If you have any lab test that is abnormal or we need to change your treatment, we will call you to review the results.   Testing/Procedures: Cardiac CT Angiography (CTA), is a special type of CT scan that uses a computer to produce multi-dimensional views of major blood vessels throughout the body. In CT angiography, a contrast material is injected through an IV to help visualize the blood vessels [facility]   Follow-Up: At [facility], you and your health needs are our priority.  As part of our continuing mission to provide you with exceptional heart care, we have created designated Provider Care Teams.  These Care Teams include your primary Cardiologist (physician) and Advanced Practice Providers (APPs -  Physician Assistants and Nurse Practitioners) who all work together to provide you with the care you need, when you need it.  We recommend signing up for the patient portal called "MyChart".  Sign up information is provided on this After Visit Summary.  MyChart is used to connect with patients for Virtual Visits (Telemedicine).  Patients are able to view lab/test results, encounter notes, upcoming appointments, etc.  Non-urgent messages can be sent to your provider as well.   To learn more about what you can do with MyChart, go to [email or web address].    Your next appointment:   As needed  The format for your next appointment:   In Person  Provider:   Buford Dresser, MD   Your cardiac CT will be scheduled at one of the below locations:   [facility] [address] [place], [place] [address] [phone]  If scheduled at [facility], please arrive at the [facility] main entrance (entrance A) of [facility] 30 minutes prior to test start time. Proceed to the [facility] Radiology Department (first floor) to check-in and test prep.  If scheduled at [facility], please arrive 15 mins early for  check-in and test prep.  Please follow these instructions carefully (unless otherwise directed):   On the Night Before the Test: . Be sure to Drink plenty of water. . Do not consume any caffeinated/decaffeinated beverages or chocolate 12 hours prior to your test. . Do not take any antihistamines 12 hours prior to your test.   On the Day of the Test: . Drink plenty of water until 1 hour prior to the test. . Do not eat any food 4 hours prior to the test. . You may take your regular medications prior to the test.  . Take metoprolol (Lopressor) 50 mg two hours prior to test. . FEMALES- please wear underwire-free bra if available       After the Test: . Drink plenty of water. . After receiving IV contrast, you may experience a mild flushed feeling. This is normal. . On occasion, you may experience a mild rash up to 24 hours after the test. This is not dangerous. If this occurs, you can take Benadryl 25 mg and increase your fluid intake. . If you experience trouble breathing, this can be serious. If it is severe call 911 IMMEDIATELY. If it is mild, please call our office. . If you take any of these medications: Glipizide/Metformin, Avandament, Glucavance, please do not take 48 hours after completing test unless otherwise instructed.   Once we have confirmed authorization from your insurance company, we will call you to set up a date and time for your test. Based on how quickly your insurance processes prior authorizations requests, please allow up to 4 weeks to be contacted for scheduling your Cardiac CT appointment. Be advised that routine Cardiac CT appointments could be scheduled as many as 8 weeks after your provider has ordered it.  For non-scheduling related questions, please contact the cardiac imaging nurse navigator should you have any questions/concerns: Marchia Bond, Cardiac Imaging Nurse Navigator Gordy Clement, Cardiac Imaging Nurse Navigator [facility] Direct Office Dial: [phone]   For scheduling needs, including cancellations and rescheduling, please call [place], [phone].         I,Mathew Stumpf,acting as a Education administrator for [employer], MD.,have documented all relevant documentation on the behalf of Buford Dresser, MD,as directed by  Buford Dresser, MD while in the presence of Buford Dresser, MD.  I, Buford Dresser, MD, have reviewed all documentation for this visit. The documentation on 08/12/20 for the exam, diagnosis, procedures, and orders are all accurate and complete.  Signed, Buford Dresser, MD PhD 08/05/2020    [facility]

## 2020-08-07 ENCOUNTER — Ambulatory Visit (INDEPENDENT_AMBULATORY_CARE_PROVIDER_SITE_OTHER): Payer: Medicare Other | Admitting: Psychiatry

## 2020-08-07 ENCOUNTER — Encounter: Payer: Self-pay | Admitting: Family Medicine

## 2020-08-07 ENCOUNTER — Other Ambulatory Visit: Payer: Self-pay

## 2020-08-07 ENCOUNTER — Ambulatory Visit: Payer: Self-pay

## 2020-08-07 ENCOUNTER — Ambulatory Visit (INDEPENDENT_AMBULATORY_CARE_PROVIDER_SITE_OTHER): Payer: Medicare Other | Admitting: Family Medicine

## 2020-08-07 DIAGNOSIS — M545 Low back pain, unspecified: Secondary | ICD-10-CM | POA: Diagnosis not present

## 2020-08-07 DIAGNOSIS — F331 Major depressive disorder, recurrent, moderate: Secondary | ICD-10-CM | POA: Diagnosis not present

## 2020-08-07 DIAGNOSIS — M25551 Pain in right hip: Secondary | ICD-10-CM | POA: Diagnosis not present

## 2020-08-07 NOTE — Progress Notes (Signed)
Virtual Visit via Video Note  I connected with Melissa Gaines on 08/07/20 at  2:30 PM EDT by a video enabled telemedicine application and verified that I am speaking with the correct person using two identifiers.  Location: Patient: Patient Home Provider: Home Office  I discussed the limitations of evaluation and management by telemedicine and the availability of in person appointments. The patient expressed understanding and agreed to proceed.  History of Present Illness: MDD  Treatment Plan Goals: 1)REVISED 02/18/20:To reduce stress by improving effectiveness of parenting skills and strategies through consistent and proper applicationand to process trauma related to her parent-child experiences growing up.  2)To decrease depressive symptoms by addressing pain management with providers, by applying CBT skills and through processing thoughts and feelings in session.  Observations/Objective: Counselor met with Client for individual therapy via Webex. Counselor assessed MH symptoms and progress on treatment plan goals, with patient reportingthatshe is having increased issues with physical health with upcoming surgeries and procedures to address pain levels. Client presents withmoderatedepression and moderateanxiety. Client denied suicidal ideation or self-harm behaviors.  Counselor assessed progress on goals 1 and 2using MI and CBT interventions.Client continues reportstaking medications as prescribed 7 out of 7 days, no current issues. Client discussed preparations for an upcoming hospital stay, as a single parent. Counselor and Client processed consideration for how to communicate issues and concerns with teenaged children. Client reflected back on what messages she would have wanted and needed as a child. Client shared desire o plan more activities for the kids for the summer. Counselor provided resources for community activities and programs.     Assessment and Plan: Counselor will continue to meet with patient to address treatment plan goals. Patient will continue to follow recommendations of providers and implement skills learned in session.  Follow Up Instructions: Counselor will send information for next session via Webex.   The patient was advised to call back or seek an in-person evaluation if the symptoms worsen or if the condition fails to improve as anticipated.  I provided40 minutes of non-face-to-face time during this encounter.   Lise Auer, LCSW

## 2020-08-07 NOTE — Progress Notes (Signed)
Office Visit Note   Patient: Melissa Gaines           Date of Birth: [birth year]           MRN: [id number] Visit Date: 08/07/2020 Requested by: [name], MD [phone],  [place] [id number] PCP: [name], MD  Subjective: Chief Complaint  Patient presents with  . Lower Back - Follow-up    Pain in the right lower back buttock region and anterior right hip. The PT helped with her mobility - not having to use a walker now.     HPI: She is here for follow-up approximately 10 months status post fall resulting in right ilium fracture.  Since last visit she has completed physical therapy and was able to stop using a walker.  She feels much better overall, but she is having intermittent pain in the posterior and anterior hip.                ROS:   All other systems were reviewed and are negative.  Objective: Vital Signs: LMP 07/15/2020   Physical Exam:  General:  Alert and oriented, in no acute distress. Pulm:  Breathing unlabored. Psy:  Normal mood, congruent affect.  Right hip: She has good strength with resistive testing in the right lower extremity.  No significant pain when doing this.  With logroll of the hip, there is not a lot of pain.  She is maximally tender over the L5-S1 level and the right SI joint, and palpation here seems to reproduce her pain.    Imaging: XR HIP UNILAT W OR W/O PELVIS 2-3 VIEWS RIGHT  Result Date: 08/07/2020 X-rays of the right hip reveal complete healing of the ilium fracture with good alignment, no sign of loosening of the prosthesis.  She has L5-S1 degenerative change and mild DJD of the right SI joint.   Assessment & Plan: 1.  Right posterior hip pain, suspect that the majority of her pain is due to sacroiliac dysfunction. -Referral to chiropractor.  If symptoms persist, then physical therapy.  If that still does not help, then SI joint injection.     Procedures: No procedures performed         PMFS History: Patient Active Problem List   Diagnosis Date Noted  . Tinnitus aurium, 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  . Lumbar back pain 01/25/2020  . Right hip pain 10/07/2019  . Left hip pain 06/26/2019  . Muscle strain of lower extremity, left, initial encounter 06/26/2019  . Healthcare maintenance 06/26/2019  . Iron deficiency anemia 04/14/2018  . Hematuria, gross 03/29/2018  . Iron deficiency anemia due to chronic blood loss 03/29/2018  . Long term current use of anticoagulant 03/29/2018  . Antiphospholipid antibody syndrome ([facility]) 01/25/2018  . Morbid obesity ([facility]) 07/26/2017  . Systemic lupus erythematosus ([facility]) 07/08/2017  . Hx pulmonary embolism 07/08/2017  . Chronic pain of right knee 05/23/2017  . Renal mass 01/26/2017  . 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/21/2016  . Vitamin D deficiency 12/16/2016  . Anemia   . Migraine    Past Medical History:  Diagnosis Date  . Anemia   . Anxiety   . Asthma   . Chronic pain   . Congenital hip dislocation   . GERD (gastroesophageal reflux disease)   . Iron deficiency anemia 04/14/2018  . Kidney mass 2017  . Migraine   . Osteoporosis   . Pathological dislocation of shoulder joint, bilateral    congential  . PTSD (post-traumatic stress disorder)   .  Sleep apnea   . Systemic lupus erythematosus ([facility])     Family History  Problem Relation Age of Onset  . Asthma Mother   . Liver cancer Mother   . Heart Problems Mother        heart attack and heart failure  . Kidney Stones Mother   . Osteoporosis Mother   . Stroke Mother   . Depression Mother   . Anxiety disorder Mother   . Kidney Stones Father   . Hernia Father   . Kidney disease Father   . Depression Sister   . Anxiety disorder Sister   . Asthma Sister   . Cancer Maternal Grandmother   . Cancer Paternal Grandmother   . Cancer Other     Past Surgical History:  Procedure Laterality Date  . CESAREAN SECTION  2005  . CESAREAN SECTION W/BTL  2010  . HIP SURGERY     in childhood for  congenital hip dislocation  . [place] RESECTION  2014  . STAPEDECTOMY  11/01/2011   L postauricular stapedectomy with CO2 laser and insertion of 6 x 4.75 mm SMart Piston  . TOTAL HIP ARTHROPLASTY     05/2000 R, 11/2000 L   Social History   Occupational History  . Occupation: Disabled   Tobacco Use  . Smoking status: Never Smoker  . Smokeless tobacco: Never Used  Vaping Use  . Vaping Use: Never used  Substance and Sexual Activity  . Alcohol use: Not Currently    Comment: when she was 49 years old; none after having children   . Drug use: No  . Sexual activity: Not Currently    Partners: Male    Birth control/protection: Condom, Surgical

## 2020-08-08 ENCOUNTER — Encounter (HOSPITAL_COMMUNITY): Payer: Self-pay | Admitting: Psychiatry

## 2020-08-12 ENCOUNTER — Encounter: Payer: Self-pay | Admitting: Cardiology

## 2020-08-13 ENCOUNTER — Telehealth: Payer: Self-pay | Admitting: Cardiology

## 2020-08-13 ENCOUNTER — Other Ambulatory Visit (HOSPITAL_COMMUNITY): Payer: Self-pay | Admitting: Psychiatry

## 2020-08-13 ENCOUNTER — Other Ambulatory Visit: Payer: Self-pay | Admitting: Family Medicine

## 2020-08-13 DIAGNOSIS — F4310 Post-traumatic stress disorder, unspecified: Secondary | ICD-10-CM

## 2020-08-13 DIAGNOSIS — F331 Major depressive disorder, recurrent, moderate: Secondary | ICD-10-CM

## 2020-08-13 DIAGNOSIS — N390 Urinary tract infection, site not specified: Secondary | ICD-10-CM | POA: Diagnosis not present

## 2020-08-13 MED ORDER — METOPROLOL TARTRATE 50 MG PO TABS: ORAL_TABLET | ORAL | 0 refills | Status: DC

## 2020-08-13 NOTE — Telephone Encounter (Signed)
Spoke with pt, metoprolol for the CT scan sent to the local pharmacy per patient request. Questions regarding when to take the medication answered.

## 2020-08-13 NOTE — Telephone Encounter (Signed)
Patient is following up regarding medication instructions prior to her CT ( see 08/13/20 patient message). She would like to know if her medication is ready for pick up and if so, she would like to go over the instructions. Please advise.

## 2020-08-15 ENCOUNTER — Other Ambulatory Visit: Payer: Self-pay

## 2020-08-15 MED ORDER — GABAPENTIN 100 MG PO CAPS: ORAL_CAPSULE | ORAL | 3 refills | Status: DC

## 2020-08-18 ENCOUNTER — Telehealth: Payer: Self-pay

## 2020-08-18 DIAGNOSIS — R072 Precordial pain: Secondary | ICD-10-CM | POA: Diagnosis not present

## 2020-08-18 DIAGNOSIS — R079 Chest pain, unspecified: Secondary | ICD-10-CM | POA: Diagnosis not present

## 2020-08-18 LAB — BASIC METABOLIC PANEL
BUN/Creatinine Ratio: 9 (ref 9–23)
BUN: 5 mg/dL — ABNORMAL LOW (ref 6–24)
CO2: 21 mmol/L (ref 20–29)
Calcium: 9.5 mg/dL (ref 8.7–10.2)
Chloride: 101 mmol/L (ref 96–106)
Creatinine, Ser: 0.53 mg/dL — ABNORMAL LOW (ref 0.57–1.00)
Glucose: 101 mg/dL — ABNORMAL HIGH (ref 65–99)
Potassium: 4.1 mmol/L (ref 3.5–5.2)
Sodium: 140 mmol/L (ref 134–144)
eGFR: 114 mL/min/{1.73_m2} (ref >59)

## 2020-08-19 DIAGNOSIS — Z862 Personal history of diseases of the blood and blood-forming organs and certain disorders involving the immune mechanism: Secondary | ICD-10-CM | POA: Diagnosis not present

## 2020-08-19 DIAGNOSIS — K219 Gastro-esophageal reflux disease without esophagitis: Secondary | ICD-10-CM | POA: Insufficient documentation

## 2020-08-19 DIAGNOSIS — R1310 Dysphagia, unspecified: Secondary | ICD-10-CM | POA: Diagnosis not present

## 2020-08-19 DIAGNOSIS — Z7901 Long term (current) use of anticoagulants: Secondary | ICD-10-CM | POA: Diagnosis not present

## 2020-08-19 NOTE — Progress Notes (Signed)
NEUROLOGY FOLLOW UP OFFICE NOTE  Melissa Gaines [id number]  Assessment/Plan:   Migraine without aura, without status migrainosus, not intractable Left sided occipital neuralgia/cervical radiculopathy Questionable history of brain tumor  1.Migraine prevention:  aimovig 140mg  Q4wks 2.Migraine rescue:  Roselyn Meier 100mg  3.Reschedule MRI of brain with and without contrast.  She has had prior MRIs in [place] and will verify. 4.Limit use of pain relievers to no more than 2 days out of week to prevent risk of rebound or medication-overuse headache. 5.  Keep headache diary 6. Follow up 6 months.  Subjective:  Melissa Gaines is a 50 year old woman with PTSD, anxiety, chronic dizziness, migraines, SLE and antiphospholipid antibody syndrome with history of PE on chronic anticoagulation who follows up for migraines.   UPDATE: In December, Aimovig was increased to 140mg .  However, the new prescription was sent to the wrong pharmacy so she has been still taking 70mg .  Roselyn Meier approved. Intensity:  moderate to severe Duration:  With Ubrelvy, lasted 60 to 90 minutes.   Frequency:  4 to 5 days a month.  Ordered MRI of brain last visit to follow up on possible history of brain tumor.  However, she has an ear implant and the facility didn't want to perform it unless it was verified to be safe.  Nobody contacted [place].   Current NSAIDS:  Tylenol, naproxen (most days a week) Current analgesics:  None Current triptans:  none Current ergotamine:  none Current anti-emetic:  none Current muscle relaxants:  none Current anti-anxiolytic:  hydroxyzine Current sleep aide:  none Current Antihypertensive medications:  none Current Antidepressant medications:  Paxil 40mg  Current Anticonvulsant medications: gabapentin 300mg  QHS Current anti-CGRP:  Aimovig 140mg , Ubrelvy 100mg  Current Vitamins/Herbal/Supplements:  D, folic acid, S06 Current Antihistamines/Decongestants:  none Other  therapy:  Icy-cold  Hormone/birth control:  none Other medications:  Eliquis, methotrexate   Caffeine:  No coffee.  Pepsi daily. Alcohol:  no Smoker:  no Diet:  4 glasses of water daily.  Skips meals Exercise:  no Depression:  yes; Anxiety:  yes Other pain:  Joint pain, back pain, lumbar radicular pain Sleep hygiene:  varies   HISTORY:  Onset:  Mild migraines in 1990s but worse since birth of son in 2010 Location:  Left temple radiating down to back of head Quality:  pounding, shocks Initial Intensity:  Severe.  She denies new headache, thunderclap headache Aura:  no Premonitory Phase:  no Postdrome:  no Associated symptoms:  Photophobia, phonophobia, dizziness, blurred vision, stuttering speech. If severe, she has memory deficits such as difficulty spelling her name or remembering her social security number.  She denies associated nausea, vomiting,  unilateral numbness or weakness. Initial Duration:  Varies.  1 hour to several hours.  Last month, she had one that lasted 5 days. Initial Frequency:  Daily Initial Frequency of abortive medication: ASA and Tylenol daily Triggers:  Unknown Relieving factors:  Resting in dark quiet room, cold packs Activity:  aggravates   She reports increased electric shock pain in the left temple radiating to back of head, lasting 1 to hours with photophobia and phonophobia, occurring twice a month.   Past NSAIDS:  ibuprofen, ASA 325mg  Past analgesics:  Extra-strength Tylenol Past abortive triptans:  rizatriptan, sumatriptan tab Past abortive ergotamine:  none Past muscle relaxants:  none Past anti-emetic:  none Past antihypertensive medications:  none Past antidepressant medications:  none Past anticonvulsant medications:  topiramate 100mg  Past anti-CGRP:  none Past vitamins/Herbal/Supplements:  none Past antihistamines/decongestants:  none Other past therapies:  none   Family history of headache:  Mom   Of note, she was told she had a  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  However, she followed up with another neurologist in [place] who performed a repeat brain MRI and was told that it was gone.  PAST MEDICAL HISTORY: Past Medical History:  Diagnosis Date   Anemia    Anxiety    Asthma    Chronic pain    Congenital hip dislocation    GERD (gastroesophageal reflux disease)    Iron deficiency anemia 04/14/2018   Kidney mass 2017   Migraine    Osteoporosis    Pathological dislocation of shoulder joint, bilateral    congential   PTSD (post-traumatic stress disorder)    Sleep apnea    Systemic lupus erythematosus ([place])     MEDICATIONS: Current Outpatient Medications on File Prior to Visit  Medication Sig Dispense Refill   acetaminophen (TYLENOL) 500 MG tablet Take 500 mg by mouth every 6 (six) hours as needed for headache.     albuterol (PROVENTIL) (2.5 MG/3ML) 0.083% nebulizer solution Take 3 mLs (2.5 mg total) by nebulization every 6 (six) hours as needed for wheezing or shortness of breath. 150 mL 1   apixaban (ELIQUIS) 5 MG TABS tablet Take 5 mg by mouth 2 (two) times daily.     cyanocobalamin (,VITAMIN B-12,) 1000 MCG/ML injection Inject 1 mL (1,000 mcg total) into the skin every 30 (thirty) days. 5 mL 0   cycloSPORINE (RESTASIS) 0.05 % ophthalmic emulsion Place 1 drop into both eyes 2 (two) times daily. 0.4 mL 0   Erenumab-aooe (AIMOVIG) 140 MG/ML SOAJ Inject 140 mg into the skin every 30 (thirty) days. 1.12 mL 5   ferrous sulfate 325 (65 FE) MG tablet Take 325 mg by mouth daily with breakfast.     gabapentin (NEURONTIN) 100 MG capsule TAKE 3 CAPSULE EVERY NIGHT AT BEDTIME 90 capsule 3   hydrOXYzine (VISTARIL) 50 MG capsule Take 1 capsule (50 mg total) by mouth at bedtime. 90 capsule 0   metoprolol tartrate (LOPRESSOR) 50 MG tablet TAKE 1 TABLET 2 HR PRIOR TO CARDIAC PROCEDURE 1 tablet 0   naproxen (NAPROSYN) 500 MG tablet Take 500 mg by mouth 2 (two) times daily.     omeprazole (PRILOSEC) 40  MG capsule TAKE 1 CAPSULE BY MOUTH  DAILY 90 capsule 1   PARoxetine (PAXIL) 40 MG tablet Take 1 tablet (40 mg total) by mouth daily. 90 tablet 0   PROAIR RESPICLICK 818 (90 Base) MCG/ACT AEPB INHALE 1 INHALATION BY  MOUTH INTO THE LUNGS EVERY  4 HOURS AS NEEDED FOR  WHEEZING, COUGHING 3 each 3   SYRINGE/NEEDLE, DISP, 1 ML 25G X 5/8" 1 ML MISC Match with B12 3 each 3   traZODone (DESYREL) 100 MG tablet Take 0.5-1 tablets (50-100 mg total) by mouth at bedtime. 30 tablet 0   Ubrogepant (UBRELVY) 100 MG TABS Take 1 tablet by mouth as needed (May repeat in 2 hours.  Maximum 2 tablets in 24 hours). 10 tablet 5   Vitamin D, Ergocalciferol, (DRISDOL) 50000 units CAPS capsule Take 1 capsule (50,000 Units total) by mouth every 7 (seven) days. 8 capsule 0   No current facility-administered medications on file prior to visit.    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 Throat closes and short of breath.    FAMILY  HISTORY: Family History  Problem Relation Age of Onset   Asthma Mother    Liver cancer Mother    Heart Problems Mother        heart attack and heart failure   Kidney Stones Mother    Osteoporosis Mother    Stroke Mother    Depression Mother    Anxiety disorder Mother    Kidney Stones Father    Hernia Father    Kidney disease Father    Depression Sister    Anxiety disorder Sister    Asthma Sister    Cancer Maternal Grandmother    Cancer Paternal Grandmother    Cancer Other       Objective:  Blood pressure 133/81, pulse 94, height 4\' 10"  (0.488 m), weight 220 lb (99.8 kg), SpO2 95 %. General: No acute distress.  Patient appears well-groomed.     Melissa Clines, DO  CC: Melissa Damme, MD

## 2020-08-20 ENCOUNTER — Encounter: Payer: Self-pay | Admitting: Neurology

## 2020-08-20 ENCOUNTER — Ambulatory Visit (INDEPENDENT_AMBULATORY_CARE_PROVIDER_SITE_OTHER): Payer: Medicare Other | Admitting: Neurology

## 2020-08-20 ENCOUNTER — Other Ambulatory Visit: Payer: Self-pay

## 2020-08-20 VITALS — BP 133/81 | HR 94 | Ht <= 58 in | Wt 220.0 lb

## 2020-08-20 DIAGNOSIS — M5481 Occipital neuralgia: Secondary | ICD-10-CM

## 2020-08-20 DIAGNOSIS — G43709 Chronic migraine without aura, not intractable, without status migrainosus: Secondary | ICD-10-CM

## 2020-08-20 DIAGNOSIS — Z87898 Personal history of other specified conditions: Secondary | ICD-10-CM

## 2020-08-20 NOTE — Patient Instructions (Signed)
Aimovig 140mg  as prescribed Ubrelvy as directed. Will reorder MRI of brain with and without contrast

## 2020-08-22 ENCOUNTER — Telehealth (HOSPITAL_COMMUNITY): Payer: Self-pay | Admitting: *Deleted

## 2020-08-22 NOTE — Telephone Encounter (Signed)
Reaching out to patient to offer assistance regarding upcoming cardiac imaging study; pt verbalizes understanding of appt date/time, parking situation and where to check in, pre-test NPO status and medications ordered, and verified current allergies; name and call back number provided for further questions should they arise  Tigerlily Christine RN Navigator Cardiac Imaging [facility] [phone] office [phone] cell  Pt to take 50mg metoprolol tartrate 2 hours prior to cardiac CT scan. 

## 2020-08-25 ENCOUNTER — Ambulatory Visit (HOSPITAL_COMMUNITY)
Admission: RE | Admit: 2020-08-25 | Discharge: 2020-08-25 | Disposition: A | Discharge status: Home / Self Care | Payer: Medicare Other | Source: Ambulatory Visit | Attending: Cardiology | Admitting: Cardiology

## 2020-08-25 ENCOUNTER — Other Ambulatory Visit: Payer: Self-pay

## 2020-08-25 DIAGNOSIS — R072 Precordial pain: Secondary | ICD-10-CM | POA: Diagnosis not present

## 2020-08-25 MED ORDER — NITROGLYCERIN 0.4 MG SL SUBL
SUBLINGUAL_TABLET | SUBLINGUAL | Status: AC
  Filled 2020-08-25: qty 2

## 2020-08-25 MED ORDER — NITROGLYCERIN 0.4 MG SL SUBL
0.8000 mg | SUBLINGUAL_TABLET | Freq: Once | SUBLINGUAL | Status: AC
  Administered 2020-08-25: 0.8 mg via SUBLINGUAL

## 2020-08-25 MED ORDER — IOHEXOL 350 MG/ML SOLN
100.0000 mL | Freq: Once | INTRAVENOUS | Status: AC | PRN
  Administered 2020-08-25: 100 mL via INTRAVENOUS

## 2020-08-25 NOTE — Addendum Note (Signed)
Addended by: Venetia Night on: 08/25/2020 01:45 PM   Modules accepted: Orders

## 2020-08-26 ENCOUNTER — Telehealth (HOSPITAL_COMMUNITY): Payer: Self-pay

## 2020-08-26 NOTE — Telephone Encounter (Signed)
Received a Prescription Renewal Form to fill out faxed to [place] from [email or web address] stating that her Hydroxyzine 50mg  has expired and needs to be filled. The last time it was sent in was 05/05/20 #90 w/0 refills. Please review and advise. Thank you

## 2020-08-26 NOTE — Telephone Encounter (Signed)
Received a fax from [facility] requesting a refill on her Trazodone 100mg . Last sent in on 07/30/20 #30 w/0 refills. Thank you

## 2020-08-26 NOTE — Telephone Encounter (Signed)
Hydroxyzine was discontinued on the last visit and we started her on trazodone for anxiety and sleep.  She should not be taking hydroxyzine.

## 2020-08-27 ENCOUNTER — Telehealth (HOSPITAL_COMMUNITY): Payer: Self-pay

## 2020-08-27 DIAGNOSIS — F331 Major depressive disorder, recurrent, moderate: Secondary | ICD-10-CM

## 2020-08-27 DIAGNOSIS — F4310 Post-traumatic stress disorder, unspecified: Secondary | ICD-10-CM

## 2020-08-27 MED ORDER — TRAZODONE HCL 100 MG PO TABS: 50.0000 mg | ORAL_TABLET | Freq: Every day | ORAL | 0 refills | Status: DC

## 2020-08-27 NOTE — Telephone Encounter (Signed)
Please call the patient and clarify what dose of trazodone she is taking.  If she need a 90-day supply?  She should not be taking hydroxyzine if taking trazodone.  Thanks

## 2020-08-27 NOTE — Telephone Encounter (Signed)
Called [facility] regarding patient's Paroxetine 40mg  that was sent to [facility]. Patient has been inactivated with [facility] and uses [facility]. [facility] is calling [facility] to get patient's Paroxetine 40mg  transferred to them

## 2020-08-27 NOTE — Telephone Encounter (Signed)
Done

## 2020-08-27 NOTE — Telephone Encounter (Signed)
Called patient to verify dose on her Trazodone. She is taking the 100mg  and does need a 90 day supply. She can't go to get her medications so she has them sent to her. [facility] needs a 90 day supply resent to them. I also clarified with her that she doesn't have any Hydroxyzine due to it being discontinued on 5/24 visit so she's not taking it. Please review and advise. Thank you

## 2020-09-01 DIAGNOSIS — M9904 Segmental and somatic dysfunction of sacral region: Secondary | ICD-10-CM | POA: Diagnosis not present

## 2020-09-01 DIAGNOSIS — M9903 Segmental and somatic dysfunction of lumbar region: Secondary | ICD-10-CM | POA: Diagnosis not present

## 2020-09-01 DIAGNOSIS — M9905 Segmental and somatic dysfunction of pelvic region: Secondary | ICD-10-CM | POA: Diagnosis not present

## 2020-09-01 DIAGNOSIS — M545 Low back pain, unspecified: Secondary | ICD-10-CM | POA: Diagnosis not present

## 2020-09-02 ENCOUNTER — Other Ambulatory Visit: Payer: Self-pay

## 2020-09-02 ENCOUNTER — Inpatient Hospital Stay: Payer: Medicare Other | Attending: Nurse Practitioner

## 2020-09-02 ENCOUNTER — Inpatient Hospital Stay: Payer: Medicare Other

## 2020-09-02 VITALS — BP 103/74 | HR 70 | Resp 18

## 2020-09-02 DIAGNOSIS — D508 Other iron deficiency anemias: Secondary | ICD-10-CM

## 2020-09-02 DIAGNOSIS — N920 Excessive and frequent menstruation with regular cycle: Secondary | ICD-10-CM | POA: Diagnosis not present

## 2020-09-02 DIAGNOSIS — D500 Iron deficiency anemia secondary to blood loss (chronic): Secondary | ICD-10-CM | POA: Diagnosis not present

## 2020-09-02 DIAGNOSIS — E538 Deficiency of other specified B group vitamins: Secondary | ICD-10-CM

## 2020-09-02 LAB — CBC WITH DIFFERENTIAL (CANCER CENTER ONLY)
Abs Immature Granulocytes: 0.02 10*3/uL (ref 0.00–0.07)
Basophils Absolute: 0 10*3/uL (ref 0.0–0.1)
Basophils Relative: 0 %
Eosinophils Absolute: 0.1 10*3/uL (ref 0.0–0.5)
Eosinophils Relative: 2 %
HCT: 42.3 % (ref 36.0–46.0)
Hemoglobin: 14.1 g/dL (ref 12.0–15.0)
Immature Granulocytes: 0 %
Lymphocytes Relative: 39 %
Lymphs Abs: 2.1 10*3/uL (ref 0.7–4.0)
MCH: 28.9 pg (ref 26.0–34.0)
MCHC: 33.3 g/dL (ref 30.0–36.0)
MCV: 86.7 fL (ref 80.0–100.0)
Monocytes Absolute: 0.3 10*3/uL (ref 0.1–1.0)
Monocytes Relative: 5 %
Neutro Abs: 2.9 10*3/uL (ref 1.7–7.7)
Neutrophils Relative %: 54 %
Platelet Count: 276 10*3/uL (ref 150–400)
RBC: 4.88 MIL/uL (ref 3.87–5.11)
RDW: 15.4 % (ref 11.5–15.5)
WBC Count: 5.4 10*3/uL (ref 4.0–10.5)
nRBC: 0 % (ref 0.0–0.2)

## 2020-09-02 LAB — VITAMIN B12: Vitamin B-12: 300 pg/mL (ref 180–914)

## 2020-09-02 LAB — FERRITIN: Ferritin: 73 ng/mL (ref 11–307)

## 2020-09-02 MED ORDER — CYANOCOBALAMIN 1000 MCG/ML IJ SOLN
INTRAMUSCULAR | Status: AC
  Filled 2020-09-02: qty 1

## 2020-09-02 MED ORDER — CYANOCOBALAMIN 1000 MCG/ML IJ SOLN
1000.0000 ug | INTRAMUSCULAR | Status: DC
  Administered 2020-09-02: 1000 ug via INTRAMUSCULAR

## 2020-09-03 ENCOUNTER — Ambulatory Visit (HOSPITAL_COMMUNITY): Payer: Medicare Other

## 2020-09-04 DIAGNOSIS — M9904 Segmental and somatic dysfunction of sacral region: Secondary | ICD-10-CM | POA: Diagnosis not present

## 2020-09-04 DIAGNOSIS — M9903 Segmental and somatic dysfunction of lumbar region: Secondary | ICD-10-CM | POA: Diagnosis not present

## 2020-09-04 DIAGNOSIS — M545 Low back pain, unspecified: Secondary | ICD-10-CM | POA: Diagnosis not present

## 2020-09-04 DIAGNOSIS — M9905 Segmental and somatic dysfunction of pelvic region: Secondary | ICD-10-CM | POA: Diagnosis not present

## 2020-09-10 DIAGNOSIS — M9904 Segmental and somatic dysfunction of sacral region: Secondary | ICD-10-CM | POA: Diagnosis not present

## 2020-09-10 DIAGNOSIS — M9905 Segmental and somatic dysfunction of pelvic region: Secondary | ICD-10-CM | POA: Diagnosis not present

## 2020-09-10 DIAGNOSIS — M545 Low back pain, unspecified: Secondary | ICD-10-CM | POA: Diagnosis not present

## 2020-09-10 DIAGNOSIS — M9903 Segmental and somatic dysfunction of lumbar region: Secondary | ICD-10-CM | POA: Diagnosis not present

## 2020-09-11 ENCOUNTER — Encounter (HOSPITAL_COMMUNITY): Payer: Self-pay | Admitting: Internal Medicine

## 2020-09-12 ENCOUNTER — Other Ambulatory Visit: Payer: Self-pay

## 2020-09-12 ENCOUNTER — Ambulatory Visit (HOSPITAL_COMMUNITY)
Admission: RE | Admit: 2020-09-12 | Discharge: 2020-09-12 | Disposition: A | Discharge status: Home / Self Care | Payer: Medicare Other | Source: Ambulatory Visit | Attending: Neurology | Admitting: Neurology

## 2020-09-12 DIAGNOSIS — G43709 Chronic migraine without aura, not intractable, without status migrainosus: Secondary | ICD-10-CM | POA: Diagnosis not present

## 2020-09-12 DIAGNOSIS — M5481 Occipital neuralgia: Secondary | ICD-10-CM | POA: Insufficient documentation

## 2020-09-12 DIAGNOSIS — Z87898 Personal history of other specified conditions: Secondary | ICD-10-CM | POA: Insufficient documentation

## 2020-09-12 DIAGNOSIS — R519 Headache, unspecified: Secondary | ICD-10-CM | POA: Diagnosis not present

## 2020-09-12 MED ORDER — GADOBUTROL 1 MMOL/ML IV SOLN
10.0000 mL | Freq: Once | INTRAVENOUS | Status: AC | PRN
  Administered 2020-09-12: 10 mL via INTRAVENOUS

## 2020-09-13 ENCOUNTER — Other Ambulatory Visit: Payer: Self-pay | Admitting: Family Medicine

## 2020-09-15 DIAGNOSIS — M9904 Segmental and somatic dysfunction of sacral region: Secondary | ICD-10-CM | POA: Diagnosis not present

## 2020-09-15 DIAGNOSIS — M9903 Segmental and somatic dysfunction of lumbar region: Secondary | ICD-10-CM | POA: Diagnosis not present

## 2020-09-15 DIAGNOSIS — M545 Low back pain, unspecified: Secondary | ICD-10-CM | POA: Diagnosis not present

## 2020-09-15 DIAGNOSIS — M9905 Segmental and somatic dysfunction of pelvic region: Secondary | ICD-10-CM | POA: Diagnosis not present

## 2020-09-16 NOTE — Progress Notes (Signed)
LMoVM to call office back.

## 2020-09-18 DIAGNOSIS — M9903 Segmental and somatic dysfunction of lumbar region: Secondary | ICD-10-CM | POA: Diagnosis not present

## 2020-09-18 DIAGNOSIS — M545 Low back pain, unspecified: Secondary | ICD-10-CM | POA: Diagnosis not present

## 2020-09-18 DIAGNOSIS — M9905 Segmental and somatic dysfunction of pelvic region: Secondary | ICD-10-CM | POA: Diagnosis not present

## 2020-09-18 DIAGNOSIS — M9904 Segmental and somatic dysfunction of sacral region: Secondary | ICD-10-CM | POA: Diagnosis not present

## 2020-09-22 NOTE — Progress Notes (Signed)
Tried calling pt,No answer. Mailbox full.

## 2020-09-23 DIAGNOSIS — M9903 Segmental and somatic dysfunction of lumbar region: Secondary | ICD-10-CM | POA: Diagnosis not present

## 2020-09-23 DIAGNOSIS — M545 Low back pain, unspecified: Secondary | ICD-10-CM | POA: Diagnosis not present

## 2020-09-23 DIAGNOSIS — M9905 Segmental and somatic dysfunction of pelvic region: Secondary | ICD-10-CM | POA: Diagnosis not present

## 2020-09-23 DIAGNOSIS — M9904 Segmental and somatic dysfunction of sacral region: Secondary | ICD-10-CM | POA: Diagnosis not present

## 2020-09-29 DIAGNOSIS — M9903 Segmental and somatic dysfunction of lumbar region: Secondary | ICD-10-CM | POA: Diagnosis not present

## 2020-09-29 DIAGNOSIS — M545 Low back pain, unspecified: Secondary | ICD-10-CM | POA: Diagnosis not present

## 2020-09-29 DIAGNOSIS — M9905 Segmental and somatic dysfunction of pelvic region: Secondary | ICD-10-CM | POA: Diagnosis not present

## 2020-09-29 DIAGNOSIS — M9904 Segmental and somatic dysfunction of sacral region: Secondary | ICD-10-CM | POA: Diagnosis not present

## 2020-10-02 ENCOUNTER — Inpatient Hospital Stay: Payer: Medicare Other

## 2020-10-06 ENCOUNTER — Encounter (HOSPITAL_COMMUNITY): Payer: Self-pay | Admitting: Internal Medicine

## 2020-10-06 DIAGNOSIS — M545 Low back pain, unspecified: Secondary | ICD-10-CM | POA: Diagnosis not present

## 2020-10-06 DIAGNOSIS — M9904 Segmental and somatic dysfunction of sacral region: Secondary | ICD-10-CM | POA: Diagnosis not present

## 2020-10-06 DIAGNOSIS — M9903 Segmental and somatic dysfunction of lumbar region: Secondary | ICD-10-CM | POA: Diagnosis not present

## 2020-10-06 DIAGNOSIS — M9905 Segmental and somatic dysfunction of pelvic region: Secondary | ICD-10-CM | POA: Diagnosis not present

## 2020-10-07 ENCOUNTER — Ambulatory Visit (INDEPENDENT_AMBULATORY_CARE_PROVIDER_SITE_OTHER): Payer: Medicare Other | Admitting: Psychiatry

## 2020-10-07 ENCOUNTER — Other Ambulatory Visit: Payer: Self-pay

## 2020-10-07 ENCOUNTER — Encounter (HOSPITAL_COMMUNITY): Payer: Self-pay | Admitting: Psychiatry

## 2020-10-07 DIAGNOSIS — F331 Major depressive disorder, recurrent, moderate: Secondary | ICD-10-CM

## 2020-10-07 DIAGNOSIS — F4310 Post-traumatic stress disorder, unspecified: Secondary | ICD-10-CM

## 2020-10-07 NOTE — Progress Notes (Signed)
Virtual Visit via Video Note  I connected with Melissa Gaines on 10/07/20 at  2:30 PM EDT by a video enabled telemedicine application and verified that I am speaking with the correct person using two identifiers.  Location: Patient: Patient Home Provider: Home Office   I discussed the limitations of evaluation and management by telemedicine and the availability of in person appointments. The patient expressed understanding and agreed to proceed.   History of Present Illness: MDD   Treatment Plan Goals: 1) REVISED 02/18/20: To reduce stress by improving effectiveness of parenting skills and strategies through consistent and proper application and to process trauma related to her parent-child experiences growing up.    2) To decrease depressive symptoms by addressing pain management with providers, by applying CBT skills and through processing thoughts and feelings in session.   Observations/Objective: Counselor met with Client for individual therapy via Webex. Counselor assessed MH symptoms and progress on treatment plan goals, with patient reporting she is doing well overall and recovering from interactions with father of her children over the weekend.  Client presents with moderate depression and moderate anxiety. Client denied suicidal ideation or self-harm behaviors.    Counselor assessed progress on goals 1 and 2 using MI and CBT interventions. Client continues reports taking medications as prescribed 7 out of 7 days, no current issues. Counselor processed thoughts and feelings experienced by Client when interacting with former husband who visited tthe family unexpectedly for her son's birthday. Client expressed concerns, triggers and impact. Client identified how she applied boundaries and balanced attending to her and her kids needs during the visit. Client discussed concern of him visiting again in the future, identifying ways she can communicate what she is and is not  willing to allow. Counselor assessed safety concerns with Client maintaining that she felt safe in her home and in the interactions. Client to communicate directly with former husband before next session as a preventative measure for upcoming visit. Counselor expressed encouragement and named strengths within the Client.       Assessment and Plan: Counselor will continue to meet with patient to address treatment plan goals. Patient will continue to follow recommendations of providers and implement skills learned in session.   Follow Up Instructions: Counselor will send information for next session via Webex.   The patient was advised to call back or seek an in-person evaluation if the symptoms worsen or if the condition fails to improve as anticipated.   I provided 40 minutes of non-face-to-face time during this encounter.     Lise Auer, LCSW

## 2020-10-14 ENCOUNTER — Other Ambulatory Visit: Payer: Self-pay

## 2020-10-14 ENCOUNTER — Inpatient Hospital Stay: Payer: Medicare Other | Attending: Nurse Practitioner

## 2020-10-14 DIAGNOSIS — E538 Deficiency of other specified B group vitamins: Secondary | ICD-10-CM | POA: Insufficient documentation

## 2020-10-14 DIAGNOSIS — Z79899 Other long term (current) drug therapy: Secondary | ICD-10-CM | POA: Diagnosis not present

## 2020-10-14 DIAGNOSIS — D508 Other iron deficiency anemias: Secondary | ICD-10-CM

## 2020-10-14 MED ORDER — CYANOCOBALAMIN 1000 MCG/ML IJ SOLN
INTRAMUSCULAR | Status: AC
  Filled 2020-10-14: qty 1

## 2020-10-14 MED ORDER — CYANOCOBALAMIN 1000 MCG/ML IJ SOLN
1000.0000 ug | INTRAMUSCULAR | Status: DC
  Administered 2020-10-14: 1000 ug via INTRAMUSCULAR

## 2020-10-14 NOTE — Patient Instructions (Signed)
Vitamin B12 Injection What is this medication? Vitamin B12 (VAHY tuh min B12) prevents and treats low vitamin B12 levels in your body. It is used in people who do not get enough vitamin B12 from their diet or when their digestive tract does not absorb enough. Vitamin B12 plays an important role in maintaining the health of your nervous system and red bloodcells. This medicine may be used for other purposes; ask your health care provider orpharmacist if you have questions. COMMON BRAND NAME(S): B-12 Compliance Kit, B-12 Injection Kit, Cyomin, LA-12,Nutri-Twelve, Physicians EZ Use B-12, Primabalt What should I tell my care team before I take this medication? They need to know if you have any of these conditions: Kidney disease Leber's disease Megaloblastic anemia An unusual or allergic reaction to cyanocobalamin, cobalt, other medications, foods, dyes, or preservatives Pregnant or trying to get pregnant Breast-feeding How should I use this medication? This medication is injected into a muscle or deeply under the skin. It is usually given in a clinic or care team's office. However, your care team mayteach you how to inject yourself. Follow all instructions. Talk to your care team about the use of this medication in children. Specialcare may be needed. Overdosage: If you think you have taken too much of this medicine contact apoison control center or emergency room at once. NOTE: This medicine is only for you. Do not share this medicine with others. What if I miss a dose? If you are given your dose at a clinic or care team's office, call to reschedule your appointment. If you give your own injections, and you miss a dose, take it as soon as you can. If it is almost time for your next dose, takeonly that dose. Do not take double or extra doses. What may interact with this medication? Colchicine Heavy alcohol intake This list may not describe all possible interactions. Give your health care provider  a list of all the medicines, herbs, non-prescription drugs, or dietary supplements you use. Also tell them if you smoke, drink alcohol, or use illegaldrugs. Some items may interact with your medicine. What should I watch for while using this medication? Visit your care team regularly. You may need blood work done while you aretaking this medication. You may need to follow a special diet. Talk to your care team. Limit youralcohol intake and avoid smoking to get the best benefit. What side effects may I notice from receiving this medication? Side effects that you should report to your care team as soon as possible: Allergic reactions-skin rash, itching, hives, swelling of the face, lips, tongue, or throat Swelling of the ankles, hands, or feet Trouble breathing Side effects that usually do not require medical attention (report to your careteam if they continue or are bothersome): Diarrhea This list may not describe all possible side effects. Call your doctor for medical advice about side effects. You may report side effects to FDA at1-800-FDA-1088. Where should I keep my medication? Keep out of the reach of children. Store at room temperature between 15 and 30 degrees C (59 and 85 degrees F).Protect from light. Throw away any unused medication after the expiration date. NOTE: This sheet is a summary. It may not cover all possible information. If you have questions about this medicine, talk to your doctor, pharmacist, orhealth care provider.  2022 Elsevier/Gold Standard (2020-04-14 11:47:06)  

## 2020-10-20 DIAGNOSIS — M9904 Segmental and somatic dysfunction of sacral region: Secondary | ICD-10-CM | POA: Diagnosis not present

## 2020-10-20 DIAGNOSIS — M9905 Segmental and somatic dysfunction of pelvic region: Secondary | ICD-10-CM | POA: Diagnosis not present

## 2020-10-20 DIAGNOSIS — M545 Low back pain, unspecified: Secondary | ICD-10-CM | POA: Diagnosis not present

## 2020-10-20 DIAGNOSIS — M9903 Segmental and somatic dysfunction of lumbar region: Secondary | ICD-10-CM | POA: Diagnosis not present

## 2020-10-23 DIAGNOSIS — M9903 Segmental and somatic dysfunction of lumbar region: Secondary | ICD-10-CM | POA: Diagnosis not present

## 2020-10-23 DIAGNOSIS — M9904 Segmental and somatic dysfunction of sacral region: Secondary | ICD-10-CM | POA: Diagnosis not present

## 2020-10-23 DIAGNOSIS — M545 Low back pain, unspecified: Secondary | ICD-10-CM | POA: Diagnosis not present

## 2020-10-23 DIAGNOSIS — M9905 Segmental and somatic dysfunction of pelvic region: Secondary | ICD-10-CM | POA: Diagnosis not present

## 2020-10-27 DIAGNOSIS — M9904 Segmental and somatic dysfunction of sacral region: Secondary | ICD-10-CM | POA: Diagnosis not present

## 2020-10-27 DIAGNOSIS — M545 Low back pain, unspecified: Secondary | ICD-10-CM | POA: Diagnosis not present

## 2020-10-27 DIAGNOSIS — M9905 Segmental and somatic dysfunction of pelvic region: Secondary | ICD-10-CM | POA: Diagnosis not present

## 2020-10-27 DIAGNOSIS — M9903 Segmental and somatic dysfunction of lumbar region: Secondary | ICD-10-CM | POA: Diagnosis not present

## 2020-10-28 ENCOUNTER — Other Ambulatory Visit (HOSPITAL_COMMUNITY): Payer: Self-pay | Admitting: Psychiatry

## 2020-10-28 DIAGNOSIS — F4310 Post-traumatic stress disorder, unspecified: Secondary | ICD-10-CM

## 2020-10-28 DIAGNOSIS — F331 Major depressive disorder, recurrent, moderate: Secondary | ICD-10-CM

## 2020-10-29 ENCOUNTER — Other Ambulatory Visit: Payer: Self-pay

## 2020-10-29 ENCOUNTER — Telehealth (INDEPENDENT_AMBULATORY_CARE_PROVIDER_SITE_OTHER): Payer: Medicare Other | Admitting: Psychiatry

## 2020-10-29 ENCOUNTER — Encounter (HOSPITAL_COMMUNITY): Payer: Self-pay | Admitting: Psychiatry

## 2020-10-29 ENCOUNTER — Other Ambulatory Visit (HOSPITAL_COMMUNITY): Payer: Self-pay | Admitting: Psychiatry

## 2020-10-29 DIAGNOSIS — F331 Major depressive disorder, recurrent, moderate: Secondary | ICD-10-CM

## 2020-10-29 DIAGNOSIS — F4310 Post-traumatic stress disorder, unspecified: Secondary | ICD-10-CM

## 2020-10-29 MED ORDER — PAROXETINE HCL 40 MG PO TABS: 40.0000 mg | ORAL_TABLET | Freq: Every day | ORAL | 0 refills | Status: DC

## 2020-10-29 MED ORDER — TRAZODONE HCL 100 MG PO TABS: 50.0000 mg | ORAL_TABLET | Freq: Every day | ORAL | 0 refills | Status: DC
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
## 2020-10-30 DIAGNOSIS — M9904 Segmental and somatic dysfunction of sacral region: Secondary | ICD-10-CM | POA: Diagnosis not present

## 2020-10-30 DIAGNOSIS — M9903 Segmental and somatic dysfunction of lumbar region: Secondary | ICD-10-CM | POA: Diagnosis not present

## 2020-10-30 DIAGNOSIS — M9905 Segmental and somatic dysfunction of pelvic region: Secondary | ICD-10-CM | POA: Diagnosis not present

## 2020-10-30 DIAGNOSIS — M545 Low back pain, unspecified: Secondary | ICD-10-CM | POA: Diagnosis not present

## 2020-11-03 ENCOUNTER — Ambulatory Visit: Payer: Medicare Other

## 2020-11-04 DIAGNOSIS — M9903 Segmental and somatic dysfunction of lumbar region: Secondary | ICD-10-CM | POA: Diagnosis not present

## 2020-11-04 DIAGNOSIS — M9905 Segmental and somatic dysfunction of pelvic region: Secondary | ICD-10-CM | POA: Diagnosis not present

## 2020-11-04 DIAGNOSIS — M9904 Segmental and somatic dysfunction of sacral region: Secondary | ICD-10-CM | POA: Diagnosis not present

## 2020-11-04 DIAGNOSIS — M545 Low back pain, unspecified: Secondary | ICD-10-CM | POA: Diagnosis not present

## 2020-11-06 DIAGNOSIS — M9903 Segmental and somatic dysfunction of lumbar region: Secondary | ICD-10-CM | POA: Diagnosis not present

## 2020-11-06 DIAGNOSIS — M9905 Segmental and somatic dysfunction of pelvic region: Secondary | ICD-10-CM | POA: Diagnosis not present

## 2020-11-06 DIAGNOSIS — M545 Low back pain, unspecified: Secondary | ICD-10-CM | POA: Diagnosis not present

## 2020-11-06 DIAGNOSIS — M9904 Segmental and somatic dysfunction of sacral region: Secondary | ICD-10-CM | POA: Diagnosis not present

## 2020-11-11 DIAGNOSIS — M9905 Segmental and somatic dysfunction of pelvic region: Secondary | ICD-10-CM | POA: Diagnosis not present

## 2020-11-11 DIAGNOSIS — M9904 Segmental and somatic dysfunction of sacral region: Secondary | ICD-10-CM | POA: Diagnosis not present

## 2020-11-11 DIAGNOSIS — M5136 Other intervertebral disc degeneration, lumbar region: Secondary | ICD-10-CM | POA: Diagnosis not present

## 2020-11-11 DIAGNOSIS — M9903 Segmental and somatic dysfunction of lumbar region: Secondary | ICD-10-CM | POA: Diagnosis not present

## 2020-11-13 DIAGNOSIS — M9905 Segmental and somatic dysfunction of pelvic region: Secondary | ICD-10-CM | POA: Diagnosis not present

## 2020-11-13 DIAGNOSIS — M9903 Segmental and somatic dysfunction of lumbar region: Secondary | ICD-10-CM | POA: Diagnosis not present

## 2020-11-13 DIAGNOSIS — M9904 Segmental and somatic dysfunction of sacral region: Secondary | ICD-10-CM | POA: Diagnosis not present

## 2020-11-13 DIAGNOSIS — M5136 Other intervertebral disc degeneration, lumbar region: Secondary | ICD-10-CM | POA: Diagnosis not present

## 2020-11-18 ENCOUNTER — Other Ambulatory Visit: Payer: Self-pay | Admitting: Family Medicine

## 2020-11-18 DIAGNOSIS — M5136 Other intervertebral disc degeneration, lumbar region: Secondary | ICD-10-CM | POA: Diagnosis not present

## 2020-11-18 DIAGNOSIS — M9904 Segmental and somatic dysfunction of sacral region: Secondary | ICD-10-CM | POA: Diagnosis not present

## 2020-11-18 DIAGNOSIS — M9905 Segmental and somatic dysfunction of pelvic region: Secondary | ICD-10-CM | POA: Diagnosis not present

## 2020-11-18 DIAGNOSIS — M9903 Segmental and somatic dysfunction of lumbar region: Secondary | ICD-10-CM | POA: Diagnosis not present

## 2020-11-19 ENCOUNTER — Other Ambulatory Visit: Payer: Self-pay

## 2020-11-19 ENCOUNTER — Inpatient Hospital Stay: Payer: Medicare Other | Attending: Nurse Practitioner

## 2020-11-19 DIAGNOSIS — N920 Excessive and frequent menstruation with regular cycle: Secondary | ICD-10-CM | POA: Diagnosis not present

## 2020-11-19 DIAGNOSIS — D508 Other iron deficiency anemias: Secondary | ICD-10-CM | POA: Insufficient documentation

## 2020-11-19 DIAGNOSIS — G43909 Migraine, unspecified, not intractable, without status migrainosus: Secondary | ICD-10-CM | POA: Insufficient documentation

## 2020-11-19 DIAGNOSIS — D6861 Antiphospholipid syndrome: Secondary | ICD-10-CM | POA: Diagnosis not present

## 2020-11-19 DIAGNOSIS — Z79899 Other long term (current) drug therapy: Secondary | ICD-10-CM | POA: Diagnosis not present

## 2020-11-19 DIAGNOSIS — Z7901 Long term (current) use of anticoagulants: Secondary | ICD-10-CM | POA: Insufficient documentation

## 2020-11-19 DIAGNOSIS — K909 Intestinal malabsorption, unspecified: Secondary | ICD-10-CM | POA: Diagnosis not present

## 2020-11-19 DIAGNOSIS — M329 Systemic lupus erythematosus, unspecified: Secondary | ICD-10-CM | POA: Insufficient documentation

## 2020-11-19 DIAGNOSIS — Z9884 Bariatric surgery status: Secondary | ICD-10-CM | POA: Diagnosis not present

## 2020-11-19 DIAGNOSIS — E538 Deficiency of other specified B group vitamins: Secondary | ICD-10-CM | POA: Diagnosis not present

## 2020-11-19 DIAGNOSIS — Z23 Encounter for immunization: Secondary | ICD-10-CM | POA: Insufficient documentation

## 2020-11-19 MED ORDER — CYANOCOBALAMIN 1000 MCG/ML IJ SOLN
1000.0000 ug | INTRAMUSCULAR | Status: DC
  Administered 2020-11-19: 1000 ug via INTRAMUSCULAR
  Filled 2020-11-19: qty 1

## 2020-11-19 NOTE — Patient Instructions (Signed)

## 2020-11-20 DIAGNOSIS — M9905 Segmental and somatic dysfunction of pelvic region: Secondary | ICD-10-CM | POA: Diagnosis not present

## 2020-11-20 DIAGNOSIS — M9903 Segmental and somatic dysfunction of lumbar region: Secondary | ICD-10-CM | POA: Diagnosis not present

## 2020-11-20 DIAGNOSIS — M9904 Segmental and somatic dysfunction of sacral region: Secondary | ICD-10-CM | POA: Diagnosis not present

## 2020-11-20 DIAGNOSIS — M5136 Other intervertebral disc degeneration, lumbar region: Secondary | ICD-10-CM | POA: Diagnosis not present

## 2020-11-21 DIAGNOSIS — H16223 Keratoconjunctivitis sicca, not specified as Sjogren's, bilateral: Secondary | ICD-10-CM | POA: Diagnosis not present

## 2020-11-21 DIAGNOSIS — H25813 Combined forms of age-related cataract, bilateral: Secondary | ICD-10-CM | POA: Diagnosis not present

## 2020-11-25 DIAGNOSIS — M9905 Segmental and somatic dysfunction of pelvic region: Secondary | ICD-10-CM | POA: Diagnosis not present

## 2020-11-25 DIAGNOSIS — M5136 Other intervertebral disc degeneration, lumbar region: Secondary | ICD-10-CM | POA: Diagnosis not present

## 2020-11-25 DIAGNOSIS — M9903 Segmental and somatic dysfunction of lumbar region: Secondary | ICD-10-CM | POA: Diagnosis not present

## 2020-11-25 DIAGNOSIS — M9904 Segmental and somatic dysfunction of sacral region: Secondary | ICD-10-CM | POA: Diagnosis not present

## 2020-11-27 DIAGNOSIS — M9903 Segmental and somatic dysfunction of lumbar region: Secondary | ICD-10-CM | POA: Diagnosis not present

## 2020-11-27 DIAGNOSIS — M9904 Segmental and somatic dysfunction of sacral region: Secondary | ICD-10-CM | POA: Diagnosis not present

## 2020-11-27 DIAGNOSIS — M9905 Segmental and somatic dysfunction of pelvic region: Secondary | ICD-10-CM | POA: Diagnosis not present

## 2020-11-27 DIAGNOSIS — M5136 Other intervertebral disc degeneration, lumbar region: Secondary | ICD-10-CM | POA: Diagnosis not present

## 2020-12-01 ENCOUNTER — Other Ambulatory Visit: Payer: Self-pay | Admitting: *Deleted

## 2020-12-01 DIAGNOSIS — D500 Iron deficiency anemia secondary to blood loss (chronic): Secondary | ICD-10-CM

## 2020-12-01 DIAGNOSIS — E538 Deficiency of other specified B group vitamins: Secondary | ICD-10-CM

## 2020-12-02 DIAGNOSIS — M9905 Segmental and somatic dysfunction of pelvic region: Secondary | ICD-10-CM | POA: Diagnosis not present

## 2020-12-02 DIAGNOSIS — M5136 Other intervertebral disc degeneration, lumbar region: Secondary | ICD-10-CM | POA: Diagnosis not present

## 2020-12-02 DIAGNOSIS — M9903 Segmental and somatic dysfunction of lumbar region: Secondary | ICD-10-CM | POA: Diagnosis not present

## 2020-12-02 DIAGNOSIS — M9904 Segmental and somatic dysfunction of sacral region: Secondary | ICD-10-CM | POA: Diagnosis not present

## 2020-12-02 NOTE — Progress Notes (Signed)
[place]   [phone] [phone]   Clinic Follow up Note   Patient Care Team: [name], MD as PCP - General Buford Dresser, MD as PCP - Cardiology (Cardiology) Pieter Partridge, DO as Consulting Physician (Neurology) 12/03/2020  CHIEF COMPLAINT: Follow up Iron and B12 deficiency anemia  Diagnosis list : 1. Anemia - on 12/19/2017 ferritin 6 serum iron 19 TIBC 478, 4% transferrin saturation Hgb 12.2 consistent with iron deficiency, began oral iron; ferritin on 04/14/18 <4, patient received IV Feraheme weekly x2 on 04/28/18 and 05/05/18    2. B12 on 10/13/27 low 164; pending GI eval. She reports history of B12 deficiency and injections while living in [place], none in last 3 years since relocating to [facility]. H/o gastric bypass surgery in 2014   3. 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 G2P2   4. SLE - presented with butterfly rash in 2005, per historical note she was treated with plaquenil and prednisone but changed to plaquenil and methotrexate due to drug AE's. Positive ANA titer on 11/15/2016; lupus anticoagulant positive on 07/08/17, 07/22/17, and 05/01/18. The antiphospholipid antibody IgG 46.1 (<15); antiphospholipid antibody IgM 13.4 (<15); antiphospholipid antibody IgA 21 (<15); beta-2 glycoprotein 1 antibody IgM 19.4 (<15); beta-2 glycoprotein antibody IgG G 50.2 (<15). Followed by Rheumatologist Dr. Gerilyn Nestle at [facility]   5. Hypercoagulation work up: On 12/19/2017 showed slightly elevated protein C activity to 181% (range 73-180%) and elevated factor VIII assay to 223%, elevated beta-2 glycoprotein IgG to 27, and elevated cardiolipin antibodies IgG, IgM, and IgA; otherwise hemoglobinopathy evaluation, Antithrombin III, protein S activity; factor V Leiden, and prothrombin gene were normal   CURRENT THERAPY:  1. IV Feraheme 510 mg PRN goal ferritin >50, last  given 06/2020 2. Oral ferrous sulfate, takes 1-3 tabs daily  3. Apixaban 2.5 mg BID  4. B12 injection 1000 mcg monthly starting 10/2018  INTERVAL HISTORY: [name] returns for follow up as scheduled. Last seen 06/02/20. Last Feraheme 06/13/20 and continues monthly B12 inj 11/19/20.  She takes oral iron 3 times a day.  She tolerates well with a normal bowel movement daily.  Her periods are irregular, now skipping months, when she does have menstrual bleeding it is thicker and heavier.  Denies other bleeding such as epistaxis, hematuria, hematochezia, or melena.  She is occasionally tired and weak but not currently.  She has seasonal allergies with some sneezing and cough, uses her asthma medication.  She notes her hernia is flaring up with some regurgitation, she saw GI at [facility] in June, planned for EGD and colonoscopy but were not done.  She continues follow-up with her other medical providers.  She has some "electrical shock" like sensation in the right calf that is more frequent lately.  Denies swelling, erythema, or other signs of thrombosis.  All other systems were reviewed with the patient and are negative.  MEDICAL HISTORY:  Past Medical History:  Diagnosis Date   Anemia    Anxiety    Asthma    Chronic pain    Congenital hip dislocation    GERD (gastroesophageal reflux disease)    Iron deficiency anemia 04/14/2018   Kidney mass 2017   Migraine    Osteoporosis    Pathological dislocation of shoulder joint, bilateral    congential   PTSD (post-traumatic stress disorder)    Sleep apnea    Systemic lupus erythematosus ([place])  SURGICAL HISTORY: Past Surgical History:  Procedure Laterality Date   CESAREAN SECTION  2005   CESAREAN SECTION W/BTL  2010   HIP SURGERY     in childhood for congenital hip dislocation   LAPAROSCOPIC GASTRIC SLEEVE RESECTION  2014   STAPEDECTOMY  11/01/2011   L postauricular stapedectomy with CO2 laser and insertion of 6 x 4.75 mm SMart Piston    TOTAL HIP ARTHROPLASTY     05/2000 R, 11/2000 L    I have reviewed the social history and family history with the patient and they are unchanged from previous note.  ALLERGIES:  is allergic to fish allergy.  MEDICATIONS:  Current Outpatient Medications  Medication Sig Dispense Refill   acetaminophen (TYLENOL) 500 MG tablet Take 500 mg by mouth every 6 (six) hours as needed for headache.     albuterol (PROVENTIL) (2.5 MG/3ML) 0.083% nebulizer solution Take 3 mLs (2.5 mg total) by nebulization every 6 (six) hours as needed for wheezing or shortness of breath. 150 mL 1   albuterol (VENTOLIN HFA) 108 (90 Base) MCG/ACT inhaler INHALE 2 PUFFS BY MOUTH EVERY 6 HOURS AS NEEDED FOR WHEEZING OR SHORTNESS OF BREATH 6.7 g 2   apixaban (ELIQUIS) 5 MG TABS tablet Take 5 mg by mouth 2 (two) times daily.     cyanocobalamin (,VITAMIN B-12,) 1000 MCG/ML injection Inject 1 mL (1,000 mcg total) into the skin every 30 (thirty) days. 5 mL 0   cycloSPORINE (RESTASIS) 0.05 % ophthalmic emulsion Place 1 drop into both eyes 2 (two) times daily. 0.4 mL 0   Erenumab-aooe (AIMOVIG) 140 MG/ML SOAJ Inject 140 mg into the skin every 30 (thirty) days. 1.12 mL 5   gabapentin (NEURONTIN) 100 MG capsule TAKE 3 CAPSULE EVERY NIGHT AT BEDTIME 90 capsule 3   metoprolol tartrate (LOPRESSOR) 50 MG tablet TAKE 1 TABLET 2 HR PRIOR TO CARDIAC PROCEDURE 1 tablet 0   naproxen (NAPROSYN) 500 MG tablet Take 500 mg by mouth 2 (two) times daily.     omeprazole (PRILOSEC) 40 MG capsule TAKE 1 CAPSULE BY MOUTH  DAILY 90 capsule 1   PARoxetine (PAXIL) 40 MG tablet Take 1 tablet (40 mg total) by mouth daily. 90 tablet 0   PROAIR RESPICLICK 443 (90 Base) MCG/ACT AEPB INHALE 1 INHALATION BY  MOUTH INTO THE LUNGS EVERY  4 HOURS AS NEEDED FOR  WHEEZING, COUGHING 3 each 3   SYRINGE/NEEDLE, DISP, 1 ML 25G X 5/8" 1 ML MISC Match with B12 3 each 3   traZODone (DESYREL) 100 MG tablet Take 0.5-1 tablets (50-100 mg total) by mouth at bedtime. 90 tablet 0    Ubrogepant (UBRELVY) 100 MG TABS Take 1 tablet by mouth as needed (May repeat in 2 hours.  Maximum 2 tablets in 24 hours). 10 tablet 5   Vitamin D, Ergocalciferol, (DRISDOL) 50000 units CAPS capsule Take 1 capsule (50,000 Units total) by mouth every 7 (seven) days. 8 capsule 0   ferrous sulfate 325 (65 FE) MG tablet Take 1 tablet (325 mg total) by mouth 3 (three) times daily with meals. 90 tablet 1   No current facility-administered medications for this visit.   Facility-Administered Medications Ordered in Other Visits  Medication Dose Route Frequency Provider Last Rate Last Admin   cyanocobalamin ((VITAMIN B-12)) injection 1,000 mcg  1,000 mcg Intramuscular Q30 days Truitt Merle, MD       influenza vac split quadrivalent PF (FLUARIX) injection 0.5 mL  0.5 mL Intramuscular Once Alla Feeling, NP  PHYSICAL EXAMINATION: ECOG PERFORMANCE STATUS: 0 - Asymptomatic  Vitals:   12/03/20 1018  BP: 136/87  Pulse: 76  Resp: 17  Temp: 98.6 F (37 C)  SpO2: 99%   Filed Weights   12/03/20 1018  Weight: 220 lb (99.8 kg)    GENERAL:alert, no distress and comfortable SKIN: No rash or significant ecchymosis EYES: sclera clear NECK: Without mass LUNGS: clear with normal breathing effort HEART: regular rate & rhythm, No lower extremity edema ABDOMEN:abdomen soft, non-tender and normal bowel sounds NEURO: alert & oriented x 3 with fluent speech, no focal motor/sensory deficits  LABORATORY DATA:  I have reviewed the data as listed CBC Latest Ref Rng & Units 12/03/2020 09/02/2020 06/02/2020  WBC 4.0 - 10.5 K/uL 5.2 5.4 8.0  Hemoglobin 12.0 - 15.0 g/dL 14.2 14.1 13.6  Hematocrit 36.0 - 46.0 % 42.2 42.3 41.9  Platelets 150 - 400 K/uL 307 276 318     CMP Latest Ref Rng & Units 12/03/2020 08/18/2020 06/02/2020  Glucose 70 - 99 mg/dL 86 101(H) 90  BUN 6 - 20 mg/dL 6 5(L) 7  Creatinine 0.44 - 1.00 mg/dL 0.65 0.53(L) 0.62  Sodium 135 - 145 mmol/L 139 140 140  Potassium 3.5 - 5.1 mmol/L 4.5 4.1  4.1  Chloride 98 - 111 mmol/L 103 101 105  CO2 22 - 32 mmol/L 24 21 23   Calcium 8.9 - 10.3 mg/dL 9.6 9.5 8.8(L)  Total Protein 6.5 - 8.1 g/dL 8.2(H) - 8.4(H)  Total Bilirubin 0.3 - 1.2 mg/dL 0.4 - 0.3  Alkaline Phos 38 - 126 U/L 67 - 74  AST 15 - 41 U/L 12(L) - 13(L)  ALT 0 - 44 U/L 11 - 15      RADIOGRAPHIC STUDIES: I have personally reviewed the radiological images as listed and agreed with the findings in the report. No results found.   ASSESSMENT & PLAN: 50 yo female with    1. 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 She initially responded well, but ferritin on 10/13/18 down to 16 and she became symptomatic with fatigue. Received IV Feraheme x1 on 11/02/2018, last given 05/15/2019 -Continue oral iron TID and IV Feraheme as needed to keep ferritin greater than 50   2. B12 deficiency, secondary to previous gastric bypass surgery - 10/13/18 B12 level decreased to 164 with signs of peripheral neuropathy.  -she began monthly B12 inj in 10/2018, her level is stable around 220 -continue monthly injections at [facility], her insurance does not cover home administration -may consider changing to oral B12 in the future, ok to continue injections for now   3. SLE  - followed by Rheum at [facility]   4. Antiphospholipid syndrome  -continue eliquis, tolerating well without bleeding except irregular menses -Refills per PCP    5. Migraines -followed by Neurology  Dr. 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 is clinically doing well.  Due to her age and irregular menses she is likely perimenopausal, no other  signs of bleeding or thrombosis.  She continues oral iron 3 times daily, monthly B12 injection, and Eliquis twice daily, tolerating well.  Labs reviewed, anemia resolved with iron and B12 supplementation.  We will follow-up on iron/ferritin and B12 from today.  We will proceed with B12 injection as scheduled and flu shot today.  If ferritin is below 50 we will arrange IV iron.  We discussed general age-appropriate health maintenance, she plans to call GI to discuss EGD/colonoscopy.  Okay to hold Eliquis prior to procedure, then resume after.  I placed the order for her annual mammogram.  Continue monthly B12, oral iron 3 times daily, and Eliquis twice daily.  Return for lab and 3 months, then lab and follow-up in 6 months or sooner if needed.  She knows to call if she develops new/worsening bleeding, signs of thrombosis, or signs of worsening anemia.  Orders Placed This Encounter  Procedures   MM Digital Screening    Standing Status:   Future    Standing Expiration Date:   12/03/2021    Order Specific Question:   Reason for Exam (SYMPTOM  OR DIAGNOSIS REQUIRED)    Answer:   annual screening mammogram    Order Specific Question:   Is the patient pregnant?    Answer:   No    Order Specific Question:   Preferred imaging location?    Answer:   [facility]    All questions were answered. The patient knows to call the clinic with any problems, questions or concerns. No barriers to learning were detected.     Alla Feeling, NP 12/03/20

## 2020-12-03 ENCOUNTER — Inpatient Hospital Stay (HOSPITAL_BASED_OUTPATIENT_CLINIC_OR_DEPARTMENT_OTHER): Payer: Medicare Other | Admitting: Nurse Practitioner

## 2020-12-03 ENCOUNTER — Other Ambulatory Visit: Payer: Self-pay

## 2020-12-03 ENCOUNTER — Encounter (HOSPITAL_COMMUNITY): Payer: Self-pay | Admitting: Internal Medicine

## 2020-12-03 ENCOUNTER — Other Ambulatory Visit: Payer: Self-pay | Admitting: Family Medicine

## 2020-12-03 ENCOUNTER — Inpatient Hospital Stay: Payer: Medicare Other

## 2020-12-03 ENCOUNTER — Encounter: Payer: Self-pay | Admitting: Nurse Practitioner

## 2020-12-03 VITALS — BP 136/87 | HR 76 | Temp 98.6°F | Resp 17 | Ht <= 58 in | Wt 220.0 lb

## 2020-12-03 DIAGNOSIS — K909 Intestinal malabsorption, unspecified: Secondary | ICD-10-CM | POA: Diagnosis not present

## 2020-12-03 DIAGNOSIS — Z7901 Long term (current) use of anticoagulants: Secondary | ICD-10-CM | POA: Diagnosis not present

## 2020-12-03 DIAGNOSIS — E538 Deficiency of other specified B group vitamins: Secondary | ICD-10-CM | POA: Diagnosis not present

## 2020-12-03 DIAGNOSIS — Z1231 Encounter for screening mammogram for malignant neoplasm of breast: Secondary | ICD-10-CM

## 2020-12-03 DIAGNOSIS — D500 Iron deficiency anemia secondary to blood loss (chronic): Secondary | ICD-10-CM

## 2020-12-03 DIAGNOSIS — Z23 Encounter for immunization: Secondary | ICD-10-CM | POA: Diagnosis not present

## 2020-12-03 DIAGNOSIS — D508 Other iron deficiency anemias: Secondary | ICD-10-CM | POA: Diagnosis not present

## 2020-12-03 DIAGNOSIS — Z9884 Bariatric surgery status: Secondary | ICD-10-CM | POA: Diagnosis not present

## 2020-12-03 DIAGNOSIS — D6861 Antiphospholipid syndrome: Secondary | ICD-10-CM | POA: Diagnosis not present

## 2020-12-03 DIAGNOSIS — Z79899 Other long term (current) drug therapy: Secondary | ICD-10-CM | POA: Diagnosis not present

## 2020-12-03 DIAGNOSIS — G43909 Migraine, unspecified, not intractable, without status migrainosus: Secondary | ICD-10-CM | POA: Diagnosis not present

## 2020-12-03 DIAGNOSIS — M329 Systemic lupus erythematosus, unspecified: Secondary | ICD-10-CM | POA: Diagnosis not present

## 2020-12-03 LAB — CBC WITH DIFFERENTIAL (CANCER CENTER ONLY)
Abs Immature Granulocytes: 0.02 10*3/uL (ref 0.00–0.07)
Basophils Absolute: 0 10*3/uL (ref 0.0–0.1)
Basophils Relative: 1 %
Eosinophils Absolute: 0.1 10*3/uL (ref 0.0–0.5)
Eosinophils Relative: 3 %
HCT: 42.2 % (ref 36.0–46.0)
Hemoglobin: 14.2 g/dL (ref 12.0–15.0)
Immature Granulocytes: 0 %
Lymphocytes Relative: 39 %
Lymphs Abs: 2 10*3/uL (ref 0.7–4.0)
MCH: 29.3 pg (ref 26.0–34.0)
MCHC: 33.6 g/dL (ref 30.0–36.0)
MCV: 87.2 fL (ref 80.0–100.0)
Monocytes Absolute: 0.3 10*3/uL (ref 0.1–1.0)
Monocytes Relative: 5 %
Neutro Abs: 2.7 10*3/uL (ref 1.7–7.7)
Neutrophils Relative %: 52 %
Platelet Count: 307 10*3/uL (ref 150–400)
RBC: 4.84 MIL/uL (ref 3.87–5.11)
RDW: 14.3 % (ref 11.5–15.5)
WBC Count: 5.2 10*3/uL (ref 4.0–10.5)
nRBC: 0 % (ref 0.0–0.2)

## 2020-12-03 LAB — IRON AND TIBC
Iron: 57 ug/dL (ref 41–142)
Saturation Ratios: 15 % — ABNORMAL LOW (ref 21–57)
TIBC: 386 ug/dL (ref 236–444)
UIBC: 329 ug/dL (ref 120–384)

## 2020-12-03 LAB — CMP (CANCER CENTER ONLY)
ALT: 11 U/L (ref 0–44)
AST: 12 U/L — ABNORMAL LOW (ref 15–41)
Albumin: 4.1 g/dL (ref 3.5–5.0)
Alkaline Phosphatase: 67 U/L (ref 38–126)
Anion gap: 12 (ref 5–15)
BUN: 6 mg/dL (ref 6–20)
CO2: 24 mmol/L (ref 22–32)
Calcium: 9.6 mg/dL (ref 8.9–10.3)
Chloride: 103 mmol/L (ref 98–111)
Creatinine: 0.65 mg/dL (ref 0.44–1.00)
GFR, Estimated: >60 mL/min (ref >60)
Glucose, Bld: 86 mg/dL (ref 70–99)
Potassium: 4.5 mmol/L (ref 3.5–5.1)
Sodium: 139 mmol/L (ref 135–145)
Total Bilirubin: 0.4 mg/dL (ref 0.3–1.2)
Total Protein: 8.2 g/dL — ABNORMAL HIGH (ref 6.5–8.1)

## 2020-12-03 LAB — FERRITIN: Ferritin: 59 ng/mL (ref 11–307)

## 2020-12-03 LAB — VITAMIN B12: Vitamin B-12: 505 pg/mL (ref 180–914)

## 2020-12-03 MED ORDER — INFLUENZA VAC SPLIT QUAD 0.5 ML IM SUSY
0.5000 mL | PREFILLED_SYRINGE | Freq: Once | INTRAMUSCULAR | Status: AC
  Administered 2020-12-03: 0.5 mL via INTRAMUSCULAR
  Filled 2020-12-03: qty 0.5

## 2020-12-03 MED ORDER — CYANOCOBALAMIN 1000 MCG/ML IJ SOLN
1000.0000 ug | INTRAMUSCULAR | Status: DC
  Administered 2020-12-03: 1000 ug via INTRAMUSCULAR
  Filled 2020-12-03: qty 1

## 2020-12-03 MED ORDER — FERROUS SULFATE 325 (65 FE) MG PO TABS: 325.0000 mg | ORAL_TABLET | Freq: Three times a day (TID) | ORAL | 1 refills | Status: AC

## 2020-12-03 NOTE — Patient Instructions (Signed)

## 2020-12-04 ENCOUNTER — Telehealth: Payer: Self-pay | Admitting: Nurse Practitioner

## 2020-12-04 DIAGNOSIS — M9905 Segmental and somatic dysfunction of pelvic region: Secondary | ICD-10-CM | POA: Diagnosis not present

## 2020-12-04 DIAGNOSIS — M5136 Other intervertebral disc degeneration, lumbar region: Secondary | ICD-10-CM | POA: Diagnosis not present

## 2020-12-04 DIAGNOSIS — M9904 Segmental and somatic dysfunction of sacral region: Secondary | ICD-10-CM | POA: Diagnosis not present

## 2020-12-04 DIAGNOSIS — M9903 Segmental and somatic dysfunction of lumbar region: Secondary | ICD-10-CM | POA: Diagnosis not present

## 2020-12-04 NOTE — Telephone Encounter (Signed)
Scheduled appts per 9/28 los. Pt aware.

## 2020-12-09 DIAGNOSIS — M9903 Segmental and somatic dysfunction of lumbar region: Secondary | ICD-10-CM | POA: Diagnosis not present

## 2020-12-09 DIAGNOSIS — M5136 Other intervertebral disc degeneration, lumbar region: Secondary | ICD-10-CM | POA: Diagnosis not present

## 2020-12-09 DIAGNOSIS — M9904 Segmental and somatic dysfunction of sacral region: Secondary | ICD-10-CM | POA: Diagnosis not present

## 2020-12-09 DIAGNOSIS — M9905 Segmental and somatic dysfunction of pelvic region: Secondary | ICD-10-CM | POA: Diagnosis not present

## 2020-12-11 DIAGNOSIS — M9905 Segmental and somatic dysfunction of pelvic region: Secondary | ICD-10-CM | POA: Diagnosis not present

## 2020-12-11 DIAGNOSIS — M9904 Segmental and somatic dysfunction of sacral region: Secondary | ICD-10-CM | POA: Diagnosis not present

## 2020-12-11 DIAGNOSIS — M9903 Segmental and somatic dysfunction of lumbar region: Secondary | ICD-10-CM | POA: Diagnosis not present

## 2020-12-11 DIAGNOSIS — M5136 Other intervertebral disc degeneration, lumbar region: Secondary | ICD-10-CM | POA: Diagnosis not present

## 2020-12-15 DIAGNOSIS — M5136 Other intervertebral disc degeneration, lumbar region: Secondary | ICD-10-CM | POA: Diagnosis not present

## 2020-12-15 DIAGNOSIS — M9904 Segmental and somatic dysfunction of sacral region: Secondary | ICD-10-CM | POA: Diagnosis not present

## 2020-12-15 DIAGNOSIS — M9905 Segmental and somatic dysfunction of pelvic region: Secondary | ICD-10-CM | POA: Diagnosis not present

## 2020-12-15 DIAGNOSIS — M9903 Segmental and somatic dysfunction of lumbar region: Secondary | ICD-10-CM | POA: Diagnosis not present

## 2020-12-18 DIAGNOSIS — M5136 Other intervertebral disc degeneration, lumbar region: Secondary | ICD-10-CM | POA: Diagnosis not present

## 2020-12-18 DIAGNOSIS — M9905 Segmental and somatic dysfunction of pelvic region: Secondary | ICD-10-CM | POA: Diagnosis not present

## 2020-12-18 DIAGNOSIS — M9903 Segmental and somatic dysfunction of lumbar region: Secondary | ICD-10-CM | POA: Diagnosis not present

## 2020-12-18 DIAGNOSIS — M9904 Segmental and somatic dysfunction of sacral region: Secondary | ICD-10-CM | POA: Diagnosis not present

## 2020-12-22 ENCOUNTER — Telehealth (HOSPITAL_BASED_OUTPATIENT_CLINIC_OR_DEPARTMENT_OTHER): Payer: Self-pay | Admitting: *Deleted

## 2020-12-22 DIAGNOSIS — M5136 Other intervertebral disc degeneration, lumbar region: Secondary | ICD-10-CM | POA: Diagnosis not present

## 2020-12-22 DIAGNOSIS — M9903 Segmental and somatic dysfunction of lumbar region: Secondary | ICD-10-CM | POA: Diagnosis not present

## 2020-12-22 DIAGNOSIS — M9904 Segmental and somatic dysfunction of sacral region: Secondary | ICD-10-CM | POA: Diagnosis not present

## 2020-12-22 DIAGNOSIS — M9905 Segmental and somatic dysfunction of pelvic region: Secondary | ICD-10-CM | POA: Diagnosis not present

## 2020-12-22 NOTE — Telephone Encounter (Signed)
Patient is on Eliquis due 2 history of systemic lupus erythematosus and antiphospholipid antibody syndrome with history of PE.  She is not on Eliquis for cardiac reasons nor was it prescribed by cardiology service.  Will defer the holding time of Eliquis to primary care provider.

## 2020-12-22 NOTE — Telephone Encounter (Signed)
   [facility] Pre-operative Risk Assessment    Patient Name: Melissa Gaines  DOB: Oct 16, 1970 MRN: [id number]  HEARTCARE STAFF:  - IMPORTANT!!!!!! Under Visit Info/Reason for Call, type in Other and utilize the format Clearance MM/DD/YY or Clearance TBD. Do not use dashes or single digits. - Please review there is not already an duplicate clearance open for this procedure. - If request is for dental extraction, please clarify the # of teeth to be extracted. - If the patient is currently at the dentist's office, call Pre-Op Callback Staff (MA/nurse) to input urgent request.  - If the patient is not currently in the dentist office, please route to the Pre-Op pool.  Request for surgical clearance:  What type of surgery is being performed? Colonoscopy/EGD  When is this surgery scheduled? TBD  What type of clearance is required (medical clearance vs. Pharmacy clearance to hold med vs. Both)? Both  Are there any medications that need to be held prior to surgery and how long? Eliquis for 2 days  Practice name and name of physician performing surgery? [facility]  What is the office phone number? [phone]   7.   What is the office fax number?         [phone]  8.   Anesthesia type (None, local, MAC, general) ? Georgiann Hahn 12/22/2020, 2:35 PM  _________________________________________________________________   (provider comments below)

## 2020-12-23 NOTE — Telephone Encounter (Signed)
    Patient Name: Melissa Gaines  DOB: 04-28-1970 MRN: [id number]  Primary Cardiologist: Buford Dresser, MD  Chart reviewed as part of pre-operative protocol coverage. Given past medical history and time since last visit, based on ACC/AHA guidelines, [name] would be at acceptable risk for the planned procedure without further cardiovascular testing.  Coronary CT obtained in June 2022 showed no coronary artery disease.  The patient was advised that if she develops new symptoms prior to surgery to contact our office to arrange for a follow-up visit, and she verbalized understanding.  As mentioned below, she is not on Eliquis due to cardiac reasons.  Patient has a history of systemic lupus erythematosus and antiphospholipid antibody syndrome with history of PE.  Will defer holding time of Eliquis to PCP who managed the medication.  I will route this recommendation to the requesting party via Epic fax function and remove from pre-op pool.  Please call with questions.  [place], [place] 12/23/2020, 6:32 PM

## 2020-12-25 DIAGNOSIS — M9905 Segmental and somatic dysfunction of pelvic region: Secondary | ICD-10-CM | POA: Diagnosis not present

## 2020-12-25 DIAGNOSIS — M9904 Segmental and somatic dysfunction of sacral region: Secondary | ICD-10-CM | POA: Diagnosis not present

## 2020-12-25 DIAGNOSIS — M9903 Segmental and somatic dysfunction of lumbar region: Secondary | ICD-10-CM | POA: Diagnosis not present

## 2020-12-25 DIAGNOSIS — M5136 Other intervertebral disc degeneration, lumbar region: Secondary | ICD-10-CM | POA: Diagnosis not present

## 2020-12-30 DIAGNOSIS — M5136 Other intervertebral disc degeneration, lumbar region: Secondary | ICD-10-CM | POA: Diagnosis not present

## 2020-12-30 DIAGNOSIS — M9904 Segmental and somatic dysfunction of sacral region: Secondary | ICD-10-CM | POA: Diagnosis not present

## 2020-12-30 DIAGNOSIS — M9905 Segmental and somatic dysfunction of pelvic region: Secondary | ICD-10-CM | POA: Diagnosis not present

## 2020-12-30 DIAGNOSIS — M9903 Segmental and somatic dysfunction of lumbar region: Secondary | ICD-10-CM | POA: Diagnosis not present

## 2021-01-01 ENCOUNTER — Other Ambulatory Visit: Payer: Self-pay | Admitting: Family Medicine

## 2021-01-01 DIAGNOSIS — J4540 Moderate persistent asthma, uncomplicated: Secondary | ICD-10-CM

## 2021-01-01 DIAGNOSIS — M9903 Segmental and somatic dysfunction of lumbar region: Secondary | ICD-10-CM | POA: Diagnosis not present

## 2021-01-01 DIAGNOSIS — M5136 Other intervertebral disc degeneration, lumbar region: Secondary | ICD-10-CM | POA: Diagnosis not present

## 2021-01-01 DIAGNOSIS — M9905 Segmental and somatic dysfunction of pelvic region: Secondary | ICD-10-CM | POA: Diagnosis not present

## 2021-01-01 DIAGNOSIS — M9904 Segmental and somatic dysfunction of sacral region: Secondary | ICD-10-CM | POA: Diagnosis not present

## 2021-01-02 ENCOUNTER — Other Ambulatory Visit: Payer: Self-pay

## 2021-01-02 ENCOUNTER — Inpatient Hospital Stay: Payer: Medicare Other | Attending: Nurse Practitioner

## 2021-01-02 DIAGNOSIS — E538 Deficiency of other specified B group vitamins: Secondary | ICD-10-CM | POA: Diagnosis not present

## 2021-01-02 DIAGNOSIS — D508 Other iron deficiency anemias: Secondary | ICD-10-CM

## 2021-01-02 DIAGNOSIS — Z79899 Other long term (current) drug therapy: Secondary | ICD-10-CM | POA: Insufficient documentation

## 2021-01-02 MED ORDER — CYANOCOBALAMIN 1000 MCG/ML IJ SOLN
1000.0000 ug | INTRAMUSCULAR | Status: DC
  Administered 2021-01-02: 1000 ug via INTRAMUSCULAR
  Filled 2021-01-02: qty 1

## 2021-01-06 DIAGNOSIS — M9904 Segmental and somatic dysfunction of sacral region: Secondary | ICD-10-CM | POA: Diagnosis not present

## 2021-01-06 DIAGNOSIS — M9905 Segmental and somatic dysfunction of pelvic region: Secondary | ICD-10-CM | POA: Diagnosis not present

## 2021-01-06 DIAGNOSIS — M5136 Other intervertebral disc degeneration, lumbar region: Secondary | ICD-10-CM | POA: Diagnosis not present

## 2021-01-06 DIAGNOSIS — M9903 Segmental and somatic dysfunction of lumbar region: Secondary | ICD-10-CM | POA: Diagnosis not present

## 2021-01-07 ENCOUNTER — Ambulatory Visit
Admission: RE | Admit: 2021-01-07 | Discharge: 2021-01-07 | Disposition: A | Discharge status: Home / Self Care | Payer: Medicare Other | Source: Ambulatory Visit | Attending: Sports Medicine | Admitting: Sports Medicine

## 2021-01-07 ENCOUNTER — Other Ambulatory Visit: Payer: Self-pay

## 2021-01-07 DIAGNOSIS — Z1231 Encounter for screening mammogram for malignant neoplasm of breast: Secondary | ICD-10-CM | POA: Diagnosis not present

## 2021-01-08 ENCOUNTER — Other Ambulatory Visit: Payer: Self-pay | Admitting: Sports Medicine

## 2021-01-08 DIAGNOSIS — R928 Other abnormal and inconclusive findings on diagnostic imaging of breast: Secondary | ICD-10-CM

## 2021-01-09 ENCOUNTER — Other Ambulatory Visit: Payer: Medicare Other

## 2021-01-09 ENCOUNTER — Telehealth: Payer: Self-pay | Admitting: Family Medicine

## 2021-01-09 DIAGNOSIS — R2232 Localized swelling, mass and lump, left upper limb: Secondary | ICD-10-CM

## 2021-01-09 NOTE — Telephone Encounter (Signed)
Received mammogram results which noted possible mass in left axilla and that patient needs ultrasound of this area. Order placed, will call her to make sure she knows of results.  [name], MD [facility], PGY-3

## 2021-01-12 DIAGNOSIS — M329 Systemic lupus erythematosus, unspecified: Secondary | ICD-10-CM | POA: Diagnosis not present

## 2021-01-12 DIAGNOSIS — R760 Raised antibody titer: Secondary | ICD-10-CM | POA: Diagnosis not present

## 2021-01-12 DIAGNOSIS — Z79899 Other long term (current) drug therapy: Secondary | ICD-10-CM | POA: Diagnosis not present

## 2021-01-12 DIAGNOSIS — M2550 Pain in unspecified joint: Secondary | ICD-10-CM | POA: Diagnosis not present

## 2021-01-13 DIAGNOSIS — M9903 Segmental and somatic dysfunction of lumbar region: Secondary | ICD-10-CM | POA: Diagnosis not present

## 2021-01-13 DIAGNOSIS — M9905 Segmental and somatic dysfunction of pelvic region: Secondary | ICD-10-CM | POA: Diagnosis not present

## 2021-01-13 DIAGNOSIS — M9904 Segmental and somatic dysfunction of sacral region: Secondary | ICD-10-CM | POA: Diagnosis not present

## 2021-01-13 DIAGNOSIS — M5136 Other intervertebral disc degeneration, lumbar region: Secondary | ICD-10-CM | POA: Diagnosis not present

## 2021-01-20 ENCOUNTER — Other Ambulatory Visit: Payer: Self-pay | Admitting: Family Medicine

## 2021-01-20 DIAGNOSIS — M9904 Segmental and somatic dysfunction of sacral region: Secondary | ICD-10-CM | POA: Diagnosis not present

## 2021-01-20 DIAGNOSIS — M9905 Segmental and somatic dysfunction of pelvic region: Secondary | ICD-10-CM | POA: Diagnosis not present

## 2021-01-20 DIAGNOSIS — M5136 Other intervertebral disc degeneration, lumbar region: Secondary | ICD-10-CM | POA: Diagnosis not present

## 2021-01-20 DIAGNOSIS — M9903 Segmental and somatic dysfunction of lumbar region: Secondary | ICD-10-CM | POA: Diagnosis not present

## 2021-01-26 ENCOUNTER — Encounter (HOSPITAL_COMMUNITY): Payer: Self-pay | Admitting: Internal Medicine

## 2021-01-26 DIAGNOSIS — M329 Systemic lupus erythematosus, unspecified: Secondary | ICD-10-CM | POA: Diagnosis not present

## 2021-01-26 DIAGNOSIS — Z79899 Other long term (current) drug therapy: Secondary | ICD-10-CM | POA: Diagnosis not present

## 2021-01-27 DIAGNOSIS — M9904 Segmental and somatic dysfunction of sacral region: Secondary | ICD-10-CM | POA: Diagnosis not present

## 2021-01-27 DIAGNOSIS — M9905 Segmental and somatic dysfunction of pelvic region: Secondary | ICD-10-CM | POA: Diagnosis not present

## 2021-01-27 DIAGNOSIS — M9903 Segmental and somatic dysfunction of lumbar region: Secondary | ICD-10-CM | POA: Diagnosis not present

## 2021-01-27 DIAGNOSIS — M5136 Other intervertebral disc degeneration, lumbar region: Secondary | ICD-10-CM | POA: Diagnosis not present

## 2021-01-28 ENCOUNTER — Other Ambulatory Visit: Payer: Self-pay | Admitting: Family Medicine

## 2021-01-28 DIAGNOSIS — R928 Other abnormal and inconclusive findings on diagnostic imaging of breast: Secondary | ICD-10-CM

## 2021-02-02 ENCOUNTER — Telehealth (HOSPITAL_BASED_OUTPATIENT_CLINIC_OR_DEPARTMENT_OTHER): Payer: Medicare Other | Admitting: Psychiatry

## 2021-02-02 ENCOUNTER — Encounter (HOSPITAL_COMMUNITY): Payer: Self-pay | Admitting: Psychiatry

## 2021-02-02 ENCOUNTER — Other Ambulatory Visit: Payer: Self-pay

## 2021-02-02 ENCOUNTER — Inpatient Hospital Stay: Payer: Medicare Other | Attending: Nurse Practitioner

## 2021-02-02 DIAGNOSIS — F4310 Post-traumatic stress disorder, unspecified: Secondary | ICD-10-CM

## 2021-02-02 DIAGNOSIS — E538 Deficiency of other specified B group vitamins: Secondary | ICD-10-CM | POA: Diagnosis not present

## 2021-02-02 DIAGNOSIS — D509 Iron deficiency anemia, unspecified: Secondary | ICD-10-CM | POA: Insufficient documentation

## 2021-02-02 DIAGNOSIS — F331 Major depressive disorder, recurrent, moderate: Secondary | ICD-10-CM | POA: Diagnosis not present

## 2021-02-02 DIAGNOSIS — D508 Other iron deficiency anemias: Secondary | ICD-10-CM

## 2021-02-02 MED ORDER — PAROXETINE HCL 40 MG PO TABS: 40.0000 mg | ORAL_TABLET | Freq: Every day | ORAL | 0 refills | Status: DC

## 2021-02-02 MED ORDER — CYANOCOBALAMIN 1000 MCG/ML IJ SOLN
1000.0000 ug | INTRAMUSCULAR | Status: DC
  Administered 2021-02-02: 12:00:00 1000 ug via INTRAMUSCULAR
  Filled 2021-02-02: qty 1

## 2021-02-02 MED ORDER — TRAZODONE HCL 100 MG PO TABS: 50.0000 mg | ORAL_TABLET | Freq: Every day | ORAL | 0 refills | Status: DC
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
## 2021-02-03 ENCOUNTER — Encounter (HOSPITAL_COMMUNITY): Payer: Self-pay | Admitting: Internal Medicine

## 2021-02-03 ENCOUNTER — Ambulatory Visit
Admission: RE | Admit: 2021-02-03 | Discharge: 2021-02-03 | Disposition: A | Discharge status: Home / Self Care | Payer: Medicare Other | Source: Ambulatory Visit | Attending: Sports Medicine | Admitting: Sports Medicine

## 2021-02-03 DIAGNOSIS — R590 Localized enlarged lymph nodes: Secondary | ICD-10-CM | POA: Diagnosis not present

## 2021-02-03 DIAGNOSIS — R928 Other abnormal and inconclusive findings on diagnostic imaging of breast: Secondary | ICD-10-CM

## 2021-02-03 DIAGNOSIS — R2232 Localized swelling, mass and lump, left upper limb: Secondary | ICD-10-CM | POA: Diagnosis not present

## 2021-02-05 DIAGNOSIS — M9905 Segmental and somatic dysfunction of pelvic region: Secondary | ICD-10-CM | POA: Diagnosis not present

## 2021-02-05 DIAGNOSIS — M5136 Other intervertebral disc degeneration, lumbar region: Secondary | ICD-10-CM | POA: Diagnosis not present

## 2021-02-05 DIAGNOSIS — M9904 Segmental and somatic dysfunction of sacral region: Secondary | ICD-10-CM | POA: Diagnosis not present

## 2021-02-05 DIAGNOSIS — M9903 Segmental and somatic dysfunction of lumbar region: Secondary | ICD-10-CM | POA: Diagnosis not present

## 2021-02-10 ENCOUNTER — Encounter (HOSPITAL_COMMUNITY): Payer: Self-pay | Admitting: Internal Medicine

## 2021-02-17 ENCOUNTER — Other Ambulatory Visit: Payer: Self-pay

## 2021-02-17 ENCOUNTER — Ambulatory Visit (INDEPENDENT_AMBULATORY_CARE_PROVIDER_SITE_OTHER): Payer: Medicare Other | Admitting: Clinical

## 2021-02-17 DIAGNOSIS — F4310 Post-traumatic stress disorder, unspecified: Secondary | ICD-10-CM

## 2021-02-17 DIAGNOSIS — F331 Major depressive disorder, recurrent, moderate: Secondary | ICD-10-CM
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
## 2021-02-17 NOTE — Plan of Care (Signed)
Pt will develop healthy coping methods to reduce frequency, intensity and duration of depressive and PTSD sxs as evidenced by engaging in journaling/poetry writing up to 3 out of 7 days/week and practicing deep breathing exercises as needed. Pt participated in completion of treatment plan.

## 2021-02-23 ENCOUNTER — Encounter (HOSPITAL_COMMUNITY): Payer: Self-pay | Admitting: Internal Medicine

## 2021-02-25 ENCOUNTER — Telehealth: Payer: Self-pay

## 2021-02-25 NOTE — Telephone Encounter (Signed)
New message   Your information has been sent to [facility].  Melissa Gaines (Key: Raynelle Dick) Aimovig 70MG /ML auto-injectors   Form [facility] Medicare Part D Electronic Prior Authorization Form (2017 NCPDP) Created 2 days ago Sent to Plan 21 minutes ago Plan Response 20 minutes ago Submit Clinical Questions 1 minute ago Determination Wait for Determination Please wait for [facility] Medicare 2017 NCPDP to return a determination.

## 2021-02-26 NOTE — Telephone Encounter (Signed)
Melissa Gaines almeyda (Key: Raynelle Dick) Aimovig 70MG /ML auto-injectors   Form [facility] Medicare Part D Electronic Prior Authorization Form (2017 NCPDP) Created 3 days ago Sent to Plan 17 hours ago Plan Response 17 hours ago Submit Clinical Questions 16 hours ago Determination Favorable 16 hours ago Message from Plan Request Reference Number: [id number]. AIMOVIG INJ 70MG /ML is approved through 03/07/2022. Your patient may now fill this prescription and it will be covered.

## 2021-03-03 ENCOUNTER — Other Ambulatory Visit: Payer: Self-pay

## 2021-03-03 DIAGNOSIS — D500 Iron deficiency anemia secondary to blood loss (chronic): Secondary | ICD-10-CM
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
## 2021-03-04 ENCOUNTER — Inpatient Hospital Stay: Payer: Medicare Other

## 2021-03-04 ENCOUNTER — Encounter: Payer: Self-pay | Admitting: Neurology

## 2021-03-04 ENCOUNTER — Inpatient Hospital Stay: Payer: Medicare Other | Attending: Nurse Practitioner

## 2021-03-04 ENCOUNTER — Other Ambulatory Visit: Payer: Self-pay

## 2021-03-04 ENCOUNTER — Ambulatory Visit (INDEPENDENT_AMBULATORY_CARE_PROVIDER_SITE_OTHER): Payer: Medicare Other | Admitting: Neurology

## 2021-03-04 VITALS — BP 117/76 | HR 96 | Ht <= 58 in | Wt 226.2 lb

## 2021-03-04 DIAGNOSIS — D509 Iron deficiency anemia, unspecified: Secondary | ICD-10-CM | POA: Insufficient documentation

## 2021-03-04 DIAGNOSIS — D519 Vitamin B12 deficiency anemia, unspecified: Secondary | ICD-10-CM | POA: Diagnosis not present

## 2021-03-04 DIAGNOSIS — G43009 Migraine without aura, not intractable, without status migrainosus: Secondary | ICD-10-CM | POA: Diagnosis not present

## 2021-03-04 DIAGNOSIS — E237 Disorder of pituitary gland, unspecified: Secondary | ICD-10-CM | POA: Diagnosis not present

## 2021-03-04 DIAGNOSIS — D500 Iron deficiency anemia secondary to blood loss (chronic): Secondary | ICD-10-CM

## 2021-03-04 DIAGNOSIS — D508 Other iron deficiency anemias: Secondary | ICD-10-CM

## 2021-03-04 LAB — CMP (CANCER CENTER ONLY)
ALT: 11 U/L (ref 0–44)
AST: 11 U/L — ABNORMAL LOW (ref 15–41)
Albumin: 4 g/dL (ref 3.5–5.0)
Alkaline Phosphatase: 65 U/L (ref 38–126)
Anion gap: 11 (ref 5–15)
BUN: 7 mg/dL (ref 6–20)
CO2: 22 mmol/L (ref 22–32)
Calcium: 9.1 mg/dL (ref 8.9–10.3)
Chloride: 104 mmol/L (ref 98–111)
Creatinine: 0.49 mg/dL (ref 0.44–1.00)
GFR, Estimated: >60 mL/min (ref >60)
Glucose, Bld: 101 mg/dL — ABNORMAL HIGH (ref 70–99)
Potassium: 4 mmol/L (ref 3.5–5.1)
Sodium: 137 mmol/L (ref 135–145)
Total Bilirubin: 0.3 mg/dL (ref 0.3–1.2)
Total Protein: 7.6 g/dL (ref 6.5–8.1)

## 2021-03-04 LAB — CBC WITH DIFFERENTIAL (CANCER CENTER ONLY)
Abs Immature Granulocytes: 0.02 10*3/uL (ref 0.00–0.07)
Basophils Absolute: 0.1 10*3/uL (ref 0.0–0.1)
Basophils Relative: 1 %
Eosinophils Absolute: 0.2 10*3/uL (ref 0.0–0.5)
Eosinophils Relative: 3 %
HCT: 41.5 % (ref 36.0–46.0)
Hemoglobin: 13.7 g/dL (ref 12.0–15.0)
Immature Granulocytes: 0 %
Lymphocytes Relative: 40 %
Lymphs Abs: 3 10*3/uL (ref 0.7–4.0)
MCH: 28.1 pg (ref 26.0–34.0)
MCHC: 33 g/dL (ref 30.0–36.0)
MCV: 85.2 fL (ref 80.0–100.0)
Monocytes Absolute: 0.4 10*3/uL (ref 0.1–1.0)
Monocytes Relative: 6 %
Neutro Abs: 3.9 10*3/uL (ref 1.7–7.7)
Neutrophils Relative %: 50 %
Platelet Count: 310 10*3/uL (ref 150–400)
RBC: 4.87 MIL/uL (ref 3.87–5.11)
RDW: 13.9 % (ref 11.5–15.5)
WBC Count: 7.6 10*3/uL (ref 4.0–10.5)
nRBC: 0 % (ref 0.0–0.2)

## 2021-03-04 LAB — IRON AND IRON BINDING CAPACITY (CC-WL,HP ONLY)
Iron: 36 ug/dL (ref 28–170)
Saturation Ratios: 9 % — ABNORMAL LOW (ref 10.4–31.8)
TIBC: 409 ug/dL (ref 250–450)
UIBC: 373 ug/dL (ref 148–442)

## 2021-03-04 LAB — FERRITIN: Ferritin: 45 ng/mL (ref 11–307)

## 2021-03-04 LAB — VITAMIN B12: Vitamin B-12: 349 pg/mL (ref 180–914)

## 2021-03-04 MED ORDER — UBRELVY 100 MG PO TABS: 1.0000 | ORAL_TABLET | ORAL | 5 refills | Status: DC | PRN

## 2021-03-04 MED ORDER — AIMOVIG 140 MG/ML ~~LOC~~ SOAJ: 140.0000 mg | SUBCUTANEOUS | 5 refills | Status: DC

## 2021-03-04 MED ORDER — CYANOCOBALAMIN 1000 MCG/ML IJ SOLN
1000.0000 ug | INTRAMUSCULAR | Status: DC
  Administered 2021-03-04: 11:00:00 1000 ug via INTRAMUSCULAR
  Filled 2021-03-04: qty 1

## 2021-03-04 NOTE — Patient Instructions (Signed)
Refilled Aimovig Refilled Roselyn Meier Refer to endocrinology for pituitary abnormality Follow up 8 months.

## 2021-03-08 ENCOUNTER — Encounter (HOSPITAL_COMMUNITY): Payer: Self-pay | Admitting: Internal Medicine

## 2021-03-12 ENCOUNTER — Other Ambulatory Visit: Payer: Self-pay

## 2021-03-12 ENCOUNTER — Ambulatory Visit (INDEPENDENT_AMBULATORY_CARE_PROVIDER_SITE_OTHER): Payer: Medicare Other | Admitting: Clinical

## 2021-03-12 DIAGNOSIS — F4310 Post-traumatic stress disorder, unspecified: Secondary | ICD-10-CM | POA: Diagnosis not present

## 2021-03-12 DIAGNOSIS — F331 Major depressive disorder, recurrent, moderate: Secondary | ICD-10-CM | POA: Diagnosis not present
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
## 2021-03-22 ENCOUNTER — Other Ambulatory Visit: Payer: Self-pay | Admitting: Family Medicine

## 2021-03-24 DIAGNOSIS — M9905 Segmental and somatic dysfunction of pelvic region: Secondary | ICD-10-CM | POA: Diagnosis not present

## 2021-03-24 DIAGNOSIS — M9904 Segmental and somatic dysfunction of sacral region: Secondary | ICD-10-CM | POA: Diagnosis not present

## 2021-03-24 DIAGNOSIS — M5386 Other specified dorsopathies, lumbar region: Secondary | ICD-10-CM | POA: Diagnosis not present

## 2021-03-24 DIAGNOSIS — M9903 Segmental and somatic dysfunction of lumbar region: Secondary | ICD-10-CM | POA: Diagnosis not present

## 2021-04-03 ENCOUNTER — Other Ambulatory Visit: Payer: Self-pay

## 2021-04-03 ENCOUNTER — Inpatient Hospital Stay: Payer: Medicare Other | Attending: Nurse Practitioner

## 2021-04-03 DIAGNOSIS — E538 Deficiency of other specified B group vitamins: Secondary | ICD-10-CM | POA: Insufficient documentation

## 2021-04-03 DIAGNOSIS — D508 Other iron deficiency anemias: Secondary | ICD-10-CM

## 2021-04-03 MED ORDER — CYANOCOBALAMIN 1000 MCG/ML IJ SOLN
1000.0000 ug | INTRAMUSCULAR | Status: DC
  Administered 2021-04-03: 1000 ug via INTRAMUSCULAR
  Filled 2021-04-03: qty 1

## 2021-04-05 ENCOUNTER — Other Ambulatory Visit (HOSPITAL_COMMUNITY): Payer: Self-pay | Admitting: Psychiatry

## 2021-04-05 DIAGNOSIS — F4310 Post-traumatic stress disorder, unspecified: Secondary | ICD-10-CM

## 2021-04-05 DIAGNOSIS — F331 Major depressive disorder, recurrent, moderate: Secondary | ICD-10-CM

## 2021-04-14 ENCOUNTER — Ambulatory Visit (HOSPITAL_COMMUNITY): Payer: Medicare Other | Admitting: Clinical

## 2021-04-14 DIAGNOSIS — M5136 Other intervertebral disc degeneration, lumbar region: Secondary | ICD-10-CM | POA: Diagnosis not present

## 2021-04-14 DIAGNOSIS — M9904 Segmental and somatic dysfunction of sacral region: Secondary | ICD-10-CM | POA: Diagnosis not present

## 2021-04-14 DIAGNOSIS — M9903 Segmental and somatic dysfunction of lumbar region: Secondary | ICD-10-CM | POA: Diagnosis not present

## 2021-04-14 DIAGNOSIS — M9905 Segmental and somatic dysfunction of pelvic region: Secondary | ICD-10-CM | POA: Diagnosis not present

## 2021-04-15 ENCOUNTER — Other Ambulatory Visit: Payer: Self-pay

## 2021-04-15 ENCOUNTER — Ambulatory Visit (INDEPENDENT_AMBULATORY_CARE_PROVIDER_SITE_OTHER): Payer: Medicare Other | Admitting: Clinical

## 2021-04-15 DIAGNOSIS — F4310 Post-traumatic stress disorder, unspecified: Secondary | ICD-10-CM

## 2021-04-15 DIAGNOSIS — F331 Major depressive disorder, recurrent, moderate: Secondary | ICD-10-CM

## 2021-04-15 NOTE — Progress Notes (Signed)
° °  THERAPIST PROGRESS NOTE  Session Time: 8am  Participation Level: Active  Behavioral Response: Alertpleasant  Type of Therapy: Individual Therapy  Treatment Goals addressed: Coping  Interventions: Supportive Virtual Visit via Telephone Note  I connected with Melissa Gaines on 04/15/21 at  8:00 AM EST by telephone and verified that I am speaking with the correct person using two identifiers.  Location: Patient: home Provider: office   I discussed the limitations, risks, security and privacy concerns of performing an evaluation and management service by telephone and the availability of in person appointments. I also discussed with the patient that there may be a patient responsible charge related to this service. The patient expressed understanding and agreed to proceed.   I discussed the assessment and treatment plan with the patient. The patient was provided an opportunity to ask questions and all were answered. The patient agreed with the plan and demonstrated an understanding of the instructions.   The patient was advised to call back or seek an in-person evaluation if the symptoms worsen or if the condition fails to improve as anticipated.  I provided 35 minutes of non-face-to-face time during this encounter.  Summary: Pt reports no change in mood or behavior. Pt says "I'm just maintaining" Pt says she experienced an anxiety attack and daughter was present to support her through this. Pt says her physical health problems and financial strain increase her anxiety.   Suicidal/Homicidal: Pt denies SI/HI no plan, intent or attempt to harm self or others reported.  Therapist Response: Assessed for changes in mood, behavior and daily functioning. Supported and validated feelings as pt discussed challenges with receiving support from children's father. Strength based approach used to highlight pt strengths that may go unnoticed. Pt encouraged to utilize strengths to build  happiness.  Plan: Return again in 4 weeks.  Diagnosis: Axis I: Post Traumatic Stress Disorder                         Major depressive disorder, recurrent episode, moderate    Axis II: No diagnosis    Yvette Rack, LCSW 04/15/2021

## 2021-04-23 DIAGNOSIS — R1310 Dysphagia, unspecified: Secondary | ICD-10-CM | POA: Diagnosis not present

## 2021-04-23 DIAGNOSIS — K2100 Gastro-esophageal reflux disease with esophagitis, without bleeding: Secondary | ICD-10-CM | POA: Diagnosis not present

## 2021-04-23 DIAGNOSIS — K219 Gastro-esophageal reflux disease without esophagitis: Secondary | ICD-10-CM | POA: Diagnosis not present

## 2021-04-23 DIAGNOSIS — D509 Iron deficiency anemia, unspecified: Secondary | ICD-10-CM | POA: Diagnosis not present

## 2021-04-23 DIAGNOSIS — Z1211 Encounter for screening for malignant neoplasm of colon: Secondary | ICD-10-CM | POA: Diagnosis not present

## 2021-04-23 DIAGNOSIS — R0902 Hypoxemia: Secondary | ICD-10-CM | POA: Diagnosis not present

## 2021-04-23 DIAGNOSIS — K222 Esophageal obstruction: Secondary | ICD-10-CM | POA: Diagnosis not present

## 2021-04-23 DIAGNOSIS — K449 Diaphragmatic hernia without obstruction or gangrene: Secondary | ICD-10-CM | POA: Diagnosis not present

## 2021-04-23 DIAGNOSIS — K2210 Ulcer of esophagus without bleeding: Secondary | ICD-10-CM | POA: Diagnosis not present

## 2021-04-23 DIAGNOSIS — K209 Esophagitis, unspecified without bleeding: Secondary | ICD-10-CM | POA: Diagnosis not present

## 2021-05-04 ENCOUNTER — Other Ambulatory Visit: Payer: Self-pay

## 2021-05-04 ENCOUNTER — Encounter (HOSPITAL_COMMUNITY): Payer: Self-pay | Admitting: Psychiatry

## 2021-05-04 ENCOUNTER — Telehealth (HOSPITAL_BASED_OUTPATIENT_CLINIC_OR_DEPARTMENT_OTHER): Payer: Medicare Other | Admitting: Psychiatry

## 2021-05-04 DIAGNOSIS — F4310 Post-traumatic stress disorder, unspecified: Secondary | ICD-10-CM

## 2021-05-04 DIAGNOSIS — F331 Major depressive disorder, recurrent, moderate: Secondary | ICD-10-CM

## 2021-05-04 MED ORDER — TRAZODONE HCL 100 MG PO TABS: 100.0000 mg | ORAL_TABLET | Freq: Every day | ORAL | 0 refills | Status: DC

## 2021-05-04 MED ORDER — PAROXETINE HCL 40 MG PO TABS: 40.0000 mg | ORAL_TABLET | Freq: Every day | ORAL | 0 refills | Status: DC
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
## 2021-05-05 ENCOUNTER — Inpatient Hospital Stay: Payer: Medicare Other | Attending: Nurse Practitioner

## 2021-05-05 DIAGNOSIS — D508 Other iron deficiency anemias: Secondary | ICD-10-CM

## 2021-05-05 DIAGNOSIS — E538 Deficiency of other specified B group vitamins: Secondary | ICD-10-CM | POA: Diagnosis not present

## 2021-05-05 MED ORDER — CYANOCOBALAMIN 1000 MCG/ML IJ SOLN
1000.0000 ug | INTRAMUSCULAR | Status: DC
  Administered 2021-05-05: 1000 ug via INTRAMUSCULAR
  Filled 2021-05-05: qty 1

## 2021-05-19 ENCOUNTER — Other Ambulatory Visit: Payer: Self-pay | Admitting: Family Medicine

## 2021-05-21 ENCOUNTER — Ambulatory Visit (INDEPENDENT_AMBULATORY_CARE_PROVIDER_SITE_OTHER): Payer: Medicare Other | Admitting: Clinical

## 2021-05-21 ENCOUNTER — Other Ambulatory Visit: Payer: Self-pay

## 2021-05-21 DIAGNOSIS — F331 Major depressive disorder, recurrent, moderate: Secondary | ICD-10-CM | POA: Diagnosis not present

## 2021-05-21 DIAGNOSIS — F4310 Post-traumatic stress disorder, unspecified: Secondary | ICD-10-CM

## 2021-05-21 NOTE — Progress Notes (Signed)
? ?  THERAPIST PROGRESS NOTE ? ?Session Time: 11am ? ?Participation Level: Active ? ?Behavioral Response: Alertpleasant ? ?Type of Therapy: Individual Therapy ? ?Treatment Goals addressed: Pt will develop healthy coping methods to reduce frequency, intensity and duration of depressive and PTSD sxs as evidenced by engaging in journaling/poetry writing up to 3 out of 7 days/week and practicing deep breathing exercises as needed. ? ?ProgressTowards Goals: Progressing ? ?Interventions: Supportive ?Virtual Visit via Telephone Note ? ?I connected with Melissa Gaines on 05/21/21 at 11:00 AM EDT by telephone and verified that I am speaking with the correct person using two identifiers. ? ?Location: ?Patient: home ?Provider: office ?  ?I discussed the limitations, risks, security and privacy concerns of performing an evaluation and management service by telephone and the availability of in person appointments. I also discussed with the patient that there may be a patient responsible charge related to this service. The patient expressed understanding and agreed to proceed. ?  ?I discussed the assessment and treatment plan with the patient. The patient was provided an opportunity to ask questions and all were answered. The patient agreed with the plan and demonstrated an understanding of the instructions. ?  ?The patient was advised to call back or seek an in-person evaluation if the symptoms worsen or if the condition fails to improve as anticipated. ? ?I provided 40 minutes of non-face-to-face time during this encounter.  ?Summary:  Pt reports she is experiencing health related complications that limits activities she can engage in with her children. Pt says being outside during certain times of the year cause health flare ups. Pt describes her mood as "little down because of my health" Nevertheless pt demonstrates good insight and awareness as she is able to identify what's in and out of her control and finding healthy  methods of coping. ? ?Suicidal/Homicidal:  Pt denies SI/HI no plan, intent or attempt to harm self or others reported. ? ?Plan: Return again in 4 weeks. ? ?Diagnosis: Post Traumatic Stress Disorder ?                        Major depressive disorder, recurrent episode, moderate ?  ? ?Collaboration of Care: Other none requested ? ?Patient/Guardian was advised Release of Information must be obtained prior to any record release in order to collaborate their care with an outside provider. Patient/Guardian was advised if they have not already done so to contact the registration department to sign all necessary forms in order for [place] to release information regarding their care.  ? ?Consent: Patient/Guardian gives verbal consent for treatment and assignment of benefits for services provided during this visit. Patient/Guardian expressed understanding and agreed to proceed.  ? ?Renly Roots Lynelle Smoke, LCSW ?05/21/2021 ? ?

## 2021-05-31 NOTE — Progress Notes (Signed)
?[place]   ?[phone] [phone]   ?Clinic Follow up Note  ? ?Patient Care Team: ?[name], MD as PCP - General ?Buford Dresser, MD as PCP - Cardiology (Cardiology) ?Pieter Partridge, DO as Consulting Physician (Neurology) ?06/02/2021 ? ?CHIEF COMPLAINT: Follow up iron and B12 deficiency ? ?Diagnosis list : ?1. Anemia - on 12/19/2017 ferritin 6 serum iron 19 TIBC 478, 4% transferrin saturation Hgb 12.2 consistent with iron deficiency, began oral iron; ferritin on 04/14/18 <4, given periodically, last 06/2020  ?  ?2. B12 on 10/13/27 low 164; She reports history of B12 deficiency and injections while living in [place], none in 3 years prior to relocating to [facility]. H/o gastric bypass surgery in 2014 ?  ?3. 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 G2P2 ?  ?4. SLE - presented with butterfly rash in 2005, per historical note she was treated with plaquenil and prednisone but changed to plaquenil and methotrexate due to drug AE's. Positive ANA titer on 11/15/2016; lupus anticoagulant positive on 07/08/17, 07/22/17, and 05/01/18. The antiphospholipid antibody IgG 46.1 (<15); antiphospholipid antibody IgM 13.4 (<15); antiphospholipid antibody IgA 21 (<15); beta-2 glycoprotein 1 antibody IgM 19.4 (<15); beta-2 glycoprotein antibody IgG G 50.2 (<15). Followed by Rheumatologist Dr. Gerilyn Nestle at [facility] ?  ?5. Hypercoagulation work up: On 12/19/2017 showed slightly elevated protein C activity to 181% (range 73-180%) and elevated factor VIII assay to 223%, elevated beta-2 glycoprotein IgG to 27, and elevated cardiolipin antibodies IgG, IgM, and IgA; otherwise hemoglobinopathy evaluation, Antithrombin III, protein S activity; factor V Leiden, and prothrombin gene were normal ?  ?CURRENT THERAPY:  ?1. IV Feraheme 510 mg PRN goal ferritin >50, last given 06/2020 ?2. Oral ferrous sulfate, takes 2-3 tabs  daily  ?3. Apixaban 5 mg BID  ?4. B12 injection 1000 mcg monthly starting 10/2018 ?  ?INTERVAL HISTORY: [name] returns for follow up as scheduled, last seen by me 12/03/20. Last IV feraheme received 06/2020. She takes oral iron 2 times daily and continues monthly B12 injection here in our clinic. She is taking eliquis 5 mg BID. She continues to have irregular menses with a cycle every 2 months.  She has heavy bleeding for a week.  She occasionally notes blood-tinged nasal sputum when she blows her nose, no epistaxis, hematochezia, or other bleeding.  Her main concern is her asthma lately.  She is not sure how to take her respiratory meds.  She was able to complete an upper endoscopy but had an asthma attack during colonoscopy so it was aborted.  Now she is afraid to sleep.  She also notes stress incontinence with coughing and sneezing which is very bothersome for her.  She continues to have "electrical" type sensations in her calves, stable at baseline.  Denies new swelling, redness, or pain. ? ? ?MEDICAL HISTORY:  ?Past Medical History:  ?Diagnosis Date  ? Anemia   ? Anxiety   ? Asthma   ? Chronic pain   ? Congenital hip dislocation   ? GERD (gastroesophageal reflux disease)   ? Iron deficiency anemia 04/14/2018  ? Kidney mass 2017  ? Migraine   ? Osteoporosis   ? Pathological dislocation of shoulder joint, bilateral   ? congential  ? PTSD (post-traumatic stress disorder)   ? Sleep apnea   ? Systemic lupus erythematosus ([place])   ? ? ?SURGICAL HISTORY: ?Past Surgical History:  ?Procedure Laterality Date  ? CESAREAN  SECTION  2005  ? CESAREAN SECTION W/BTL  2010  ? HIP SURGERY    ? in childhood for congenital hip dislocation  ? LAPAROSCOPIC GASTRIC SLEEVE RESECTION  2014  ? STAPEDECTOMY  11/01/2011  ? L postauricular stapedectomy with CO2 laser and insertion of 6 x 4.75 mm SMart Piston  ? TOTAL HIP ARTHROPLASTY    ? 05/2000 R, 11/2000 L  ? ? ?I have reviewed the social history and family history with the patient and they  are unchanged from previous note. ? ?ALLERGIES:  is allergic to fish allergy. ? ?MEDICATIONS:  ?Current Outpatient Medications  ?Medication Sig Dispense Refill  ? albuterol (PROVENTIL) (2.5 MG/3ML) 0.083% nebulizer solution Take 3 mLs (2.5 mg total) by nebulization every 6 (six) hours as needed for wheezing or shortness of breath. 150 mL 1  ? albuterol (VENTOLIN HFA) 108 (90 Base) MCG/ACT inhaler INHALE 2 PUFFS BY MOUTH EVERY 6 HOURS AS NEEDED FOR WHEEZING OR SHORTNESS OF BREATH 6.7 g 2  ? apixaban (ELIQUIS) 5 MG TABS tablet Take 5 mg by mouth 2 (two) times daily.    ? cyanocobalamin (,VITAMIN B-12,) 1000 MCG/ML injection Inject 1 mL (1,000 mcg total) into the skin every 30 (thirty) days. 5 mL 0  ? cycloSPORINE (RESTASIS) 0.05 % ophthalmic emulsion Place 1 drop into both eyes 2 (two) times daily. 0.4 mL 0  ? Erenumab-aooe (AIMOVIG) 140 MG/ML SOAJ Inject 140 mg into the skin every 30 (thirty) days. 1.12 mL 5  ? ferrous sulfate 325 (65 FE) MG tablet Take 1 tablet (325 mg total) by mouth 3 (three) times daily with meals. 90 tablet 1  ? gabapentin (NEURONTIN) 100 MG capsule TAKE 3 CAPSULE EVERY NIGHT AT BEDTIME 90 capsule 3  ? omeprazole (PRILOSEC) 40 MG capsule TAKE 1 CAPSULE BY MOUTH  DAILY 90 capsule 1  ? PARoxetine (PAXIL) 40 MG tablet Take 1 tablet (40 mg total) by mouth daily. 90 tablet 0  ? PROAIR RESPICLICK 169 (90 Base) MCG/ACT AEPB INHALE 1 INHALATION BY  MOUTH INTO THE LUNGS EVERY  4 HOURS AS NEEDED FOR  WHEEZING COUGHING 3 each 3  ? SYRINGE/NEEDLE, DISP, 1 ML 25G X 5/8" 1 ML MISC Match with B12 3 each 3  ? traZODone (DESYREL) 100 MG tablet Take 1 tablet (100 mg total) by mouth at bedtime. 90 tablet 0  ? Ubrogepant (UBRELVY) 100 MG TABS Take 1 tablet by mouth as needed (May repeat in 2 hours.  Maximum 2 tablets in 24 hours). 10 tablet 5  ? Vitamin D, Ergocalciferol, (DRISDOL) 50000 units CAPS capsule Take 1 capsule (50,000 Units total) by mouth every 7 (seven) days. 8 capsule 0  ? acetaminophen (TYLENOL) 500  MG tablet Take 500 mg by mouth every 6 (six) hours as needed for headache.    ? metoprolol tartrate (LOPRESSOR) 50 MG tablet TAKE 1 TABLET 2 HR PRIOR TO CARDIAC PROCEDURE 1 tablet 0  ? naproxen (NAPROSYN) 500 MG tablet Take 500 mg by mouth 2 (two) times daily.    ? ?No current facility-administered medications for this visit.  ? ? ?PHYSICAL EXAMINATION: ? ?Vitals:  ? 06/02/21 0902  ?BP: 107/67  ?Pulse: 82  ?Resp: 18  ?Temp: 98.4 ?F (36.9 ?C)  ?SpO2: 94%  ? ?Filed Weights  ? 06/02/21 0902  ?Weight: 215 lb 12.8 oz (97.9 kg)  ? ? ?GENERAL:alert, no distress and comfortable ?SKIN: No rash ?EYES:  sclera clear ?NECK: Without mass ?LUNGS: clear with normal breathing effort ?HEART: regular rate & rhythm,  no lower extremity edema ?ABDOMEN:abdomen soft, non-tender and normal bowel sounds ?NEURO: alert & oriented x 3 with fluent speech ? ?LABORATORY DATA:  ?I have reviewed the data as listed ? ?  Latest Ref Rng & Units 06/02/2021  ?  8:40 AM 03/04/2021  ? 10:44 AM 12/03/2020  ? 10:08 AM  ?CBC  ?WBC 4.0 - 10.5 K/uL 7.8   7.6   5.2    ?Hemoglobin 12.0 - 15.0 g/dL 14.2   13.7   14.2    ?Hematocrit 36.0 - 46.0 % 42.8   41.5   42.2    ?Platelets 150 - 400 K/uL 341   310   307    ? ? ? ? ?  Latest Ref Rng & Units 06/02/2021  ?  8:40 AM 03/04/2021  ? 10:44 AM 12/03/2020  ? 10:08 AM  ?CMP  ?Glucose 70 - 99 mg/dL 104   101   86    ?BUN 6 - 20 mg/dL '7   7   6    '$ ?Creatinine 0.44 - 1.00 mg/dL 0.53   0.49   0.65    ?Sodium 135 - 145 mmol/L 139   137   139    ?Potassium 3.5 - 5.1 mmol/L 3.8   4.0   4.5    ?Chloride 98 - 111 mmol/L 106   104   103    ?CO2 22 - 32 mmol/L '23   22   24    '$ ?Calcium 8.9 - 10.3 mg/dL 9.2   9.1   9.6    ?Total Protein 6.5 - 8.1 g/dL 7.9   7.6   8.2    ?Total Bilirubin 0.3 - 1.2 mg/dL 0.4   0.3   0.4    ?Alkaline Phos 38 - 126 U/L 73   65   67    ?AST 15 - 41 U/L '10   11   12    '$ ?ALT 0 - 44 U/L '11   11   11    '$ ? ? ? ? ?RADIOGRAPHIC STUDIES: ?I have personally reviewed the radiological images as listed and agreed with  the findings in the report. ?No results found.  ? ?ASSESSMENT & PLAN: 51 yo female with  ?  ?1. 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

## 2021-06-02 ENCOUNTER — Other Ambulatory Visit: Payer: Self-pay

## 2021-06-02 ENCOUNTER — Encounter: Payer: Self-pay | Admitting: Family Medicine

## 2021-06-02 ENCOUNTER — Inpatient Hospital Stay (HOSPITAL_BASED_OUTPATIENT_CLINIC_OR_DEPARTMENT_OTHER): Payer: Medicare Other | Admitting: Nurse Practitioner

## 2021-06-02 ENCOUNTER — Inpatient Hospital Stay: Payer: Medicare Other

## 2021-06-02 ENCOUNTER — Encounter: Payer: Self-pay | Admitting: Nurse Practitioner

## 2021-06-02 ENCOUNTER — Inpatient Hospital Stay: Payer: Medicare Other | Attending: Nurse Practitioner

## 2021-06-02 VITALS — BP 107/67 | HR 82 | Temp 98.4°F | Resp 18 | Ht <= 58 in | Wt 215.8 lb

## 2021-06-02 DIAGNOSIS — N393 Stress incontinence (female) (male): Secondary | ICD-10-CM | POA: Insufficient documentation

## 2021-06-02 DIAGNOSIS — D508 Other iron deficiency anemias: Secondary | ICD-10-CM

## 2021-06-02 DIAGNOSIS — Z7901 Long term (current) use of anticoagulants: Secondary | ICD-10-CM | POA: Insufficient documentation

## 2021-06-02 DIAGNOSIS — G43909 Migraine, unspecified, not intractable, without status migrainosus: Secondary | ICD-10-CM | POA: Insufficient documentation

## 2021-06-02 DIAGNOSIS — D509 Iron deficiency anemia, unspecified: Secondary | ICD-10-CM | POA: Insufficient documentation

## 2021-06-02 DIAGNOSIS — D500 Iron deficiency anemia secondary to blood loss (chronic): Secondary | ICD-10-CM | POA: Diagnosis not present

## 2021-06-02 DIAGNOSIS — E538 Deficiency of other specified B group vitamins: Secondary | ICD-10-CM | POA: Diagnosis not present

## 2021-06-02 DIAGNOSIS — M329 Systemic lupus erythematosus, unspecified: Secondary | ICD-10-CM | POA: Insufficient documentation

## 2021-06-02 DIAGNOSIS — D6861 Antiphospholipid syndrome: Secondary | ICD-10-CM | POA: Insufficient documentation

## 2021-06-02 DIAGNOSIS — Z9884 Bariatric surgery status: Secondary | ICD-10-CM | POA: Insufficient documentation

## 2021-06-02 DIAGNOSIS — J45909 Unspecified asthma, uncomplicated: Secondary | ICD-10-CM | POA: Insufficient documentation

## 2021-06-02 LAB — CMP (CANCER CENTER ONLY)
ALT: 11 U/L (ref 0–44)
AST: 10 U/L — ABNORMAL LOW (ref 15–41)
Albumin: 4.1 g/dL (ref 3.5–5.0)
Alkaline Phosphatase: 73 U/L (ref 38–126)
Anion gap: 10 (ref 5–15)
BUN: 7 mg/dL (ref 6–20)
CO2: 23 mmol/L (ref 22–32)
Calcium: 9.2 mg/dL (ref 8.9–10.3)
Chloride: 106 mmol/L (ref 98–111)
Creatinine: 0.53 mg/dL (ref 0.44–1.00)
GFR, Estimated: >60 mL/min (ref >60)
Glucose, Bld: 104 mg/dL — ABNORMAL HIGH (ref 70–99)
Potassium: 3.8 mmol/L (ref 3.5–5.1)
Sodium: 139 mmol/L (ref 135–145)
Total Bilirubin: 0.4 mg/dL (ref 0.3–1.2)
Total Protein: 7.9 g/dL (ref 6.5–8.1)

## 2021-06-02 LAB — IRON AND IRON BINDING CAPACITY (CC-WL,HP ONLY)
Iron: 44 ug/dL (ref 28–170)
Saturation Ratios: 11 % (ref 10.4–31.8)
TIBC: 414 ug/dL (ref 250–450)
UIBC: 370 ug/dL (ref 148–442)

## 2021-06-02 LAB — CBC WITH DIFFERENTIAL (CANCER CENTER ONLY)
Abs Immature Granulocytes: 0.02 10*3/uL (ref 0.00–0.07)
Basophils Absolute: 0 10*3/uL (ref 0.0–0.1)
Basophils Relative: 1 %
Eosinophils Absolute: 0.2 10*3/uL (ref 0.0–0.5)
Eosinophils Relative: 3 %
HCT: 42.8 % (ref 36.0–46.0)
Hemoglobin: 14.2 g/dL (ref 12.0–15.0)
Immature Granulocytes: 0 %
Lymphocytes Relative: 38 %
Lymphs Abs: 3 10*3/uL (ref 0.7–4.0)
MCH: 27.6 pg (ref 26.0–34.0)
MCHC: 33.2 g/dL (ref 30.0–36.0)
MCV: 83.3 fL (ref 80.0–100.0)
Monocytes Absolute: 0.4 10*3/uL (ref 0.1–1.0)
Monocytes Relative: 5 %
Neutro Abs: 4.1 10*3/uL (ref 1.7–7.7)
Neutrophils Relative %: 53 %
Platelet Count: 341 10*3/uL (ref 150–400)
RBC: 5.14 MIL/uL — ABNORMAL HIGH (ref 3.87–5.11)
RDW: 14.5 % (ref 11.5–15.5)
WBC Count: 7.8 10*3/uL (ref 4.0–10.5)
nRBC: 0 % (ref 0.0–0.2)

## 2021-06-02 LAB — VITAMIN B12: Vitamin B-12: 396 pg/mL (ref 180–914)

## 2021-06-02 LAB — FERRITIN: Ferritin: 51 ng/mL (ref 11–307)

## 2021-06-02 MED ORDER — CYANOCOBALAMIN 1000 MCG/ML IJ SOLN
1000.0000 ug | INTRAMUSCULAR | Status: DC
  Administered 2021-06-02: 1000 ug via INTRAMUSCULAR
  Filled 2021-06-02: qty 1

## 2021-06-04 ENCOUNTER — Telehealth: Payer: Self-pay | Admitting: Hematology

## 2021-06-04 NOTE — Telephone Encounter (Signed)
Scheduled follow-up appointments per 3/28 los. Patient is aware. 

## 2021-06-12 ENCOUNTER — Other Ambulatory Visit: Payer: Self-pay | Admitting: Family Medicine

## 2021-06-12 DIAGNOSIS — M810 Age-related osteoporosis without current pathological fracture: Secondary | ICD-10-CM

## 2021-06-15 ENCOUNTER — Other Ambulatory Visit: Payer: Medicare Other

## 2021-06-18 ENCOUNTER — Ambulatory Visit (HOSPITAL_COMMUNITY): Payer: Medicare Other | Admitting: Clinical

## 2021-06-18 ENCOUNTER — Telehealth (HOSPITAL_COMMUNITY): Payer: Self-pay | Admitting: Clinical

## 2021-06-19 ENCOUNTER — Ambulatory Visit: Payer: Medicare Other | Admitting: Family Medicine

## 2021-06-23 NOTE — Progress Notes (Deleted)
    SUBJECTIVE:   CHIEF COMPLAINT / HPI:   Asthma:  HC maintenance: last pap smear completed October 2018, NILM, HPV negative. Due for repeat pap smear by October 2023. Patient had colonoscopy done at [facility] in February 2023, cut short due to hypoxemia, no polyps or masses found; recommend repeat screening in 1 year, February 2024. Screening mammogram due in 01/2022.   Renal Mass: per problem review- in 2019 overview note made stating " suspected small 6 mm lateral cortex of right kidney seen on outside MRI on 04/19/2016.  Will need repeat imaging evaluation in 12 months." In 2019, [place] of kidneys done at [facility] did not show any mass.   PERTINENT  PMH / PSH: SLE, antiphospholipid syndrome, hx of PE, iron deficiency anemia  OBJECTIVE:   There were no vitals taken for this visit.  ***  ASSESSMENT/PLAN:   No problem-specific Assessment & Plan notes found for this encounter.     [name], MD [place]

## 2021-06-24 ENCOUNTER — Ambulatory Visit: Payer: Medicare Other | Admitting: Family Medicine

## 2021-06-25 ENCOUNTER — Other Ambulatory Visit: Payer: Medicare Other

## 2021-06-25 ENCOUNTER — Ambulatory Visit
Admission: RE | Admit: 2021-06-25 | Discharge: 2021-06-25 | Disposition: A | Discharge status: Home / Self Care | Payer: Medicare Other | Source: Ambulatory Visit | Attending: Family Medicine | Admitting: Family Medicine

## 2021-06-25 DIAGNOSIS — M8588 Other specified disorders of bone density and structure, other site: Secondary | ICD-10-CM | POA: Diagnosis not present

## 2021-06-25 DIAGNOSIS — M810 Age-related osteoporosis without current pathological fracture: Secondary | ICD-10-CM

## 2021-06-30 ENCOUNTER — Inpatient Hospital Stay: Payer: Medicare Other | Attending: Nurse Practitioner

## 2021-06-30 ENCOUNTER — Other Ambulatory Visit: Payer: Self-pay

## 2021-06-30 DIAGNOSIS — E538 Deficiency of other specified B group vitamins: Secondary | ICD-10-CM | POA: Insufficient documentation

## 2021-06-30 DIAGNOSIS — D508 Other iron deficiency anemias: Secondary | ICD-10-CM

## 2021-06-30 MED ORDER — CYANOCOBALAMIN 1000 MCG/ML IJ SOLN
1000.0000 ug | INTRAMUSCULAR | Status: DC
  Administered 2021-06-30: 1000 ug via INTRAMUSCULAR
  Filled 2021-06-30: qty 1

## 2021-07-01 ENCOUNTER — Other Ambulatory Visit (HOSPITAL_COMMUNITY)
Admission: RE | Admit: 2021-07-01 | Discharge: 2021-07-01 | Disposition: A | Discharge status: Home / Self Care | Payer: Medicare Other | Source: Ambulatory Visit | Attending: Family Medicine | Admitting: Family Medicine

## 2021-07-01 ENCOUNTER — Ambulatory Visit (INDEPENDENT_AMBULATORY_CARE_PROVIDER_SITE_OTHER): Payer: Medicare Other | Admitting: Family Medicine

## 2021-07-01 ENCOUNTER — Encounter: Payer: Self-pay | Admitting: Family Medicine

## 2021-07-01 VITALS — BP 118/76 | HR 70 | Ht <= 58 in | Wt 216.4 lb

## 2021-07-01 DIAGNOSIS — Z7901 Long term (current) use of anticoagulants: Secondary | ICD-10-CM

## 2021-07-01 DIAGNOSIS — Z1151 Encounter for screening for human papillomavirus (HPV): Secondary | ICD-10-CM | POA: Diagnosis not present

## 2021-07-01 DIAGNOSIS — H9312 Tinnitus, left ear: Secondary | ICD-10-CM

## 2021-07-01 DIAGNOSIS — J301 Allergic rhinitis due to pollen: Secondary | ICD-10-CM

## 2021-07-01 DIAGNOSIS — N898 Other specified noninflammatory disorders of vagina: Secondary | ICD-10-CM | POA: Diagnosis not present

## 2021-07-01 DIAGNOSIS — Z131 Encounter for screening for diabetes mellitus: Secondary | ICD-10-CM | POA: Diagnosis not present

## 2021-07-01 DIAGNOSIS — E785 Hyperlipidemia, unspecified: Secondary | ICD-10-CM

## 2021-07-01 DIAGNOSIS — D6861 Antiphospholipid syndrome: Secondary | ICD-10-CM

## 2021-07-01 DIAGNOSIS — Z01419 Encounter for gynecological examination (general) (routine) without abnormal findings: Secondary | ICD-10-CM | POA: Insufficient documentation

## 2021-07-01 DIAGNOSIS — N2889 Other specified disorders of kidney and ureter: Secondary | ICD-10-CM

## 2021-07-01 DIAGNOSIS — J4550 Severe persistent asthma, uncomplicated: Secondary | ICD-10-CM | POA: Diagnosis not present

## 2021-07-01 DIAGNOSIS — Z113 Encounter for screening for infections with a predominantly sexual mode of transmission: Secondary | ICD-10-CM | POA: Diagnosis not present

## 2021-07-01 DIAGNOSIS — Z124 Encounter for screening for malignant neoplasm of cervix: Secondary | ICD-10-CM | POA: Diagnosis not present

## 2021-07-01 DIAGNOSIS — Z114 Encounter for screening for human immunodeficiency virus [HIV]: Secondary | ICD-10-CM | POA: Diagnosis not present

## 2021-07-01 DIAGNOSIS — Z0000 Encounter for general adult medical examination without abnormal findings: Secondary | ICD-10-CM

## 2021-07-01 DIAGNOSIS — J45909 Unspecified asthma, uncomplicated: Secondary | ICD-10-CM | POA: Insufficient documentation

## 2021-07-01 LAB — POCT WET PREP (WET MOUNT)
Clue Cells Wet Prep Whiff POC: NEGATIVE
Trichomonas Wet Prep HPF POC: ABSENT

## 2021-07-01 MED ORDER — APIXABAN 5 MG PO TABS: 5.0000 mg | ORAL_TABLET | Freq: Two times a day (BID) | ORAL | 11 refills | Status: DC

## 2021-07-01 MED ORDER — BUDESONIDE-FORMOTEROL FUMARATE 160-4.5 MCG/ACT IN AERO: 2.0000 | INHALATION_SPRAY | Freq: Every day | RESPIRATORY_TRACT | 12 refills | Status: DC

## 2021-07-01 MED ORDER — FLUTICASONE PROPIONATE 50 MCG/ACT NA SUSP: 2.0000 | Freq: Every day | NASAL | 6 refills | Status: DC

## 2021-07-01 NOTE — Assessment & Plan Note (Signed)
UTD with colonoscopy and mammogram, see above. ? ?Pap smear with routine STI testing HIV, RPR, GC, wet prep done today. ?

## 2021-07-01 NOTE — Patient Instructions (Addendum)
It was a pleasure to see you today! ? ?Continue to use albuterol inhaler as needed for shortness of breath, wheezing, or cough ?Start using symbicort 2 puffs once a day with a spacer every morning to help control your asthma symptoms ?For allergies: use flonase one spray each nostril every morning. This works best when used for 3-4 weeks at a time ?I have placed a referral for ENT. You should receive a phone call in 1-2 weeks to schedule this appointment. If for any reason you do not receive a phone call or need more help scheduling this appointment, please call our office at [phone]. ?Your next mammogram is due in November ?We will get some labs today.  If they are abnormal or we need to do something about them, I will call you.  If they are normal, I will send you a message on MyChart (if it is active) or a letter in the mail.  If you don't hear from [place] in 2 weeks, please call the office  [phone]. ?Follow up in 1 month if asthma symptoms not improved ? ? ?Be Well, ? ?Dr. Chauncey Reading ? ?

## 2021-07-01 NOTE — Assessment & Plan Note (Signed)
Refill apixaban 5 mg BID. ?

## 2021-07-01 NOTE — Assessment & Plan Note (Signed)
Patient did not hear from referral to ENT last time, still having tinnitus and has hearing aid. Needs to establish care with ENT, referral placed. ?

## 2021-07-01 NOTE — Progress Notes (Signed)
? ? ?SUBJECTIVE:  ? ?Chief compliant/HPI: annual examination ? ?Melissa Gaines is a 51 y.o. who presents today for an annual exam.  ? ?HC maintenance: last pap smear completed October 2018, NILM, HPV negative. Due for repeat pap smear by October 2023. Patient had colonoscopy done at [facility] in February 2023, cut short due to hypoxemia, no polyps or masses found; recommend repeat screening in 1 year, February 2024. Screening mammogram due in 01/2022.  ? ?Asthma: patient had respiratory distress during anesthesia for colonoscopy. GI recommended follow up. She has history of asthma, though no PFT's in chart to review. She has albuterol that she uses several times a day for SOB. She has been waking up every night with coughing symptoms. She denies fever, runny nose, cough. She does report seasonal allergies. Patient also has lupus and antiphospholipid syndrome. She is on apixaban and reports not missing any medications, no unilateral leg swelling, no recent trips or surgeries. She follows with rheumatology at [facility]. She does not have a history of smoking. ? ?Renal Mass: per problem review- in 2019 overview note made stating " suspected small 6 mm lateral cortex of right kidney seen on outside MRI on 04/19/2016.  Will need repeat imaging evaluation in 12 months." In 2019, [place] of kidneys done at [facility] did not show any mass.  ? ?Tinnitus: hearing implant from 2013, patient having "ringing" in her ears, not pulsatile. Would like referral to ENT.  ? ?History tabs reviewed and updated.  ? ?Review of systems form reviewed and notable for night time coughing and shortness of breath.  ? ?OBJECTIVE:  ? ?BP 118/76   Pulse 70   Ht 4' 10"  (1.473 m)   Wt 216 lb 6 oz (98.1 kg)   LMP 05/05/2021   SpO2 96%   BMI 45.22 kg/m?   ?Nursing note and vitals reviewed ?GEN: appears older than age, latina woman, resting comfortably in chair, NAD, class III obesity ?HEENT: NCAT. PERRLA. Sclera without  injection or icterus. MMM. Clear oropharynx. TM's non-erythematous, non-bulging, b/l. Nasal passages erythematous and inflamed. ?Neck: Supple. No LAD. ?Cardiac: Regular rate and rhythm. Normal S1/S2. No murmurs, rubs, or gallops appreciated. 2+ radial pulses. ?Lungs: Clear bilaterally to ascultation. No increased WOB, no accessory muscle usage. No w/r/r. ?PELVIC:  Normal appearing external female genitalia, normal vaginal epithelium, no abnormal discharge. Normal appearing cervix.  ?Neuro: AOx3  ?Ext: no edema ?Psych: Pleasant and appropriate ?ASSESSMENT/PLAN:  ? ?Tinnitus aurium, left ?Patient did not hear from referral to ENT last time, still having tinnitus and has hearing aid. Needs to establish care with ENT, referral placed. ? ?Long term current use of anticoagulant ?Refill apixaban 5 mg BID. ? ?Healthcare maintenance ?UTD with colonoscopy and mammogram, see above. ? ?Pap smear with routine STI testing HIV, RPR, GC, wet prep done today. ? ?Asthma ?Patient reports history of asthma, recently worse after anesthesia for colonoscopy. She has never had anesthesia complications before. She has been having nightly asthmatic symptoms, only has albuterol. Will treat with symbicort and gave spacer with teaching. Will obtain PFTs. Given history of SLE, could also be SLE manifestation. Unlikely to be PE as she is on full dose anticoagulation. No history of smoking, if emphysema, would consider a1antitrypsin testing. Scheduled for PFTs in 2 weeks, follow up with me thereafter. ?  ?Annual Examination  ?See AVS for age appropriate recommendations.   ?Blood pressure reviewed and at goal.  ?Asked about intimate partner violence and resources given as appropriate  ?  The patient s/p BTL. ? ?Considered the following items based upon USPSTF recommendations: ?Diabetes screening: discussed and ordered ?Screening for elevated cholesterol: discussed and ordered ?HIV testing: discussed and ordered ?Hepatitis C: discussed and  ordered ?Hepatitis B:  not ordered ?Syphilis if at high risk: discussed and ordered ?GC/CT  not at high risk, ordered ?Reviewed risk factors for latent tuberculosis and not indicated ?Reviewed risk factors for osteoporosis. Patient has osteopenia, recent  DEXA scan. Recommend Vit D, Calcium, and strengthening exercises. ? ?Discussed family history, BRCA testing not indicated. ?Cervical cancer screening: due for Pap today, cytology + HPV ordered ?Breast cancer screening:  due in November 2023 ?Colorectal cancer screening: up to date on screening for CRC. if age 30 or over.  ? ?Follow up in 1 year or sooner if indicated.  ? ? ?[name], MD ?[place]   ?

## 2021-07-02 LAB — HEMOGLOBIN A1C
Est. average glucose Bld gHb Est-mCnc: 123 mg/dL
Hgb A1c MFr Bld: 5.9 % — ABNORMAL HIGH (ref 4.8–5.6)

## 2021-07-02 LAB — COMPREHENSIVE METABOLIC PANEL WITH GFR
ALT: 17 IU/L (ref 0–32)
AST: 18 IU/L (ref 0–40)
Albumin/Globulin Ratio: 1.4 (ref 1.2–2.2)
Albumin: 4.5 g/dL (ref 3.8–4.8)
Alkaline Phosphatase: 80 IU/L (ref 44–121)
BUN/Creatinine Ratio: 14 (ref 9–23)
BUN: 8 mg/dL (ref 6–24)
Bilirubin Total: <0.2 mg/dL (ref 0.0–1.2)
CO2: 24 mmol/L (ref 20–29)
Calcium: 9.8 mg/dL (ref 8.7–10.2)
Chloride: 103 mmol/L (ref 96–106)
Creatinine, Ser: 0.58 mg/dL (ref 0.57–1.00)
Globulin, Total: 3.3 g/dL (ref 1.5–4.5)
Glucose: 79 mg/dL (ref 70–99)
Potassium: 4.3 mmol/L (ref 3.5–5.2)
Sodium: 142 mmol/L (ref 134–144)
Total Protein: 7.8 g/dL (ref 6.0–8.5)
eGFR: 111 mL/min/1.73

## 2021-07-02 LAB — LIPID PANEL
Chol/HDL Ratio: 4.6 ratio — ABNORMAL HIGH (ref 0.0–4.4)
Cholesterol, Total: 198 mg/dL (ref 100–199)
HDL: 43 mg/dL (ref >39)
LDL Chol Calc (NIH): 129 mg/dL — ABNORMAL HIGH (ref 0–99)
Triglycerides: 145 mg/dL (ref 0–149)
VLDL Cholesterol Cal: 26 mg/dL (ref 5–40)

## 2021-07-02 LAB — CERVICOVAGINAL ANCILLARY ONLY
Chlamydia: NEGATIVE
Comment: NEGATIVE
Comment: NORMAL
Neisseria Gonorrhea: NEGATIVE

## 2021-07-02 LAB — SYPHILIS: RPR W/REFLEX TO RPR TITER AND TREPONEMAL ANTIBODIES, TRADITIONAL SCREENING AND DIAGNOSIS ALGORITHM: RPR Ser Ql: NONREACTIVE

## 2021-07-02 LAB — HIV ANTIBODY (ROUTINE TESTING W REFLEX): HIV Screen 4th Generation wRfx: NONREACTIVE

## 2021-07-02 NOTE — Assessment & Plan Note (Signed)
Patient reports history of asthma, recently worse after anesthesia for colonoscopy. She has never had anesthesia complications before. She has been having nightly asthmatic symptoms, only has albuterol. Will treat with symbicort and gave spacer with teaching. Will obtain PFTs. Given history of SLE, could also be SLE manifestation. Unlikely to be PE as she is on full dose anticoagulation. No history of smoking, if emphysema, would consider a1antitrypsin testing. Scheduled for PFTs in 2 weeks, follow up with me thereafter. ?

## 2021-07-03 LAB — CYTOLOGY - PAP
Comment: NEGATIVE
Diagnosis: NEGATIVE
High risk HPV: NEGATIVE

## 2021-07-05 ENCOUNTER — Other Ambulatory Visit (HOSPITAL_COMMUNITY): Payer: Self-pay | Admitting: Psychiatry

## 2021-07-05 DIAGNOSIS — F4310 Post-traumatic stress disorder, unspecified: Secondary | ICD-10-CM

## 2021-07-05 DIAGNOSIS — F331 Major depressive disorder, recurrent, moderate: Secondary | ICD-10-CM

## 2021-07-07 ENCOUNTER — Other Ambulatory Visit: Payer: Self-pay | Admitting: Neurology

## 2021-07-13 DIAGNOSIS — M329 Systemic lupus erythematosus, unspecified: Secondary | ICD-10-CM | POA: Diagnosis not present

## 2021-07-13 DIAGNOSIS — M2550 Pain in unspecified joint: Secondary | ICD-10-CM | POA: Diagnosis not present

## 2021-07-13 DIAGNOSIS — Z79899 Other long term (current) drug therapy: Secondary | ICD-10-CM | POA: Diagnosis not present

## 2021-07-16 DIAGNOSIS — M5386 Other specified dorsopathies, lumbar region: Secondary | ICD-10-CM | POA: Diagnosis not present

## 2021-07-16 DIAGNOSIS — M9905 Segmental and somatic dysfunction of pelvic region: Secondary | ICD-10-CM | POA: Diagnosis not present

## 2021-07-16 DIAGNOSIS — M9904 Segmental and somatic dysfunction of sacral region: Secondary | ICD-10-CM | POA: Diagnosis not present

## 2021-07-16 DIAGNOSIS — M9903 Segmental and somatic dysfunction of lumbar region: Secondary | ICD-10-CM | POA: Diagnosis not present

## 2021-07-17 ENCOUNTER — Other Ambulatory Visit: Payer: Self-pay | Admitting: Family Medicine

## 2021-07-20 DIAGNOSIS — Z79899 Other long term (current) drug therapy: Secondary | ICD-10-CM | POA: Diagnosis not present

## 2021-07-20 DIAGNOSIS — M329 Systemic lupus erythematosus, unspecified: Secondary | ICD-10-CM | POA: Diagnosis not present

## 2021-07-20 DIAGNOSIS — M2550 Pain in unspecified joint: Secondary | ICD-10-CM | POA: Diagnosis not present

## 2021-07-21 ENCOUNTER — Ambulatory Visit (INDEPENDENT_AMBULATORY_CARE_PROVIDER_SITE_OTHER): Payer: Medicare Other | Admitting: Pharmacist

## 2021-07-21 ENCOUNTER — Encounter (HOSPITAL_COMMUNITY): Payer: Self-pay | Admitting: Internal Medicine

## 2021-07-21 DIAGNOSIS — J4550 Severe persistent asthma, uncomplicated: Secondary | ICD-10-CM | POA: Diagnosis not present

## 2021-07-21 MED ORDER — CETIRIZINE HCL 10 MG PO TABS: 10.0000 mg | ORAL_TABLET | Freq: Every day | ORAL | 11 refills | Status: DC

## 2021-07-21 NOTE — Patient Instructions (Addendum)
Nice to meet you today.  ? ?Please start taking Cetirizine '10mg'$  (Zyrtec) once daily.  ? ?Take Symbicort 1 inhalation TWICE daily.  ?If you continue to have congestion and cough after 1 week increase to 2 inhalations twice daily.  ? ?Continue to use your albuterol if needed for severe tightness or shortness of breath.  ? ?Please follow-up with Dr. Chauncey Reading in the next few weeks.  ?

## 2021-07-21 NOTE — Assessment & Plan Note (Signed)
Patient has been experiencing cough, tightness in her chest and dyspnea for several weeks and taking prn albuterol approximately 3 days per week.  Additionally, she reports using her symbicort inhaler in place of the albuterol for severe dyspnea 1 day per week.  Medication adherence only for PRN symptom.  ?Spirometry evaluation with patient administered bronchodilator immediately prior to visit revealing near normal lung function despite symptoms of allergic rhinitis and asthma.  ?-continue both symbicort and albuterol MDI   however, the strategy for use was reviewed.  ?Instructed to take Symbicort 1 inhalation TWICE daily.  ?If congestion and cough continue after 1 week, she was instructed to increase Symbicort to 2 inhalations twice daily.  ?Continue to use your albuterol if needed for severe tightness or shortness of breath.  ?-Educated patient on purpose, proper use, potential adverse effects including risk of esophageal candidiasis and need to rinse mouth after each use.  ?-Reviewed results of pulmonary function tests.  ?She may best be managed by SMART therapy wit symbicort only in the future.  ?This would hopefully maximize use of steroid use for respiratory symptoms to minimize risk of severe symptom exacerbation ?

## 2021-07-21 NOTE — Progress Notes (Signed)
? ?S:    ?Patient arrives in good spirits.    Presents for lung function evaluation.   ?Patient was referred and last seen and referred by Primary Care Provider, Dr. Chauncey Reading on 07/01/2021.   ? ?Patient reports breathing has been problematic for several months. Patient reports allergic sx consistent with allergic rhinitis.  She states grass pollen is "the worst". ? ?Patient reports semi-adherence to medications.  She uses medications only prn for rescue as she believes that she becomes tolerant of multiple medications and then they do not work after several days.  ?Patient reports last dose of asthma medications was immediately prior to visit while waiting to be seen.  ?Current asthma medications: albuterol MDI, Symbicort MDI ?Rescue inhaler use frequency: about 3 days per week recently. She states she only uses the symbicort inhaler about 1 day per week when symptoms are worst.  ? ?Level of asthma sx control- in the last 4 weeks: ?Question Scoring Patient Score  ?Daytime sx > 2x/week Yes (1)   No (0) 1  ?Any nighttime waking due to asthma Yes (1)   No (0) 1  ?Reliever needed >2x/week Yes (1)   No (0) 1  ?Any activity limitation due to asthma Yes (1)   No (0) 1  ? Total Score 4  ?Well controlled - 0, Partly controlled - 1-2, Uncontrolled 3-4 ? ? ?O: ?Physical Exam ?Constitutional:   ?   Appearance: Normal appearance. She is obese.  ?Pulmonary:  ?   Effort: Pulmonary effort is normal.  ?Neurological:  ?   Mental Status: She is alert.  ?Psychiatric:     ?   Mood and Affect: Mood normal.     ?   Behavior: Behavior normal.     ?   Thought Content: Thought content normal.     ?   Judgment: Judgment normal.  ? ? ?Review of Systems  ?Constitutional:  Negative for fever.  ?HENT:  Positive for sinus pain (pressure).   ?Respiratory:  Positive for cough, sputum production and shortness of breath (Happens more at night time).   ? ?See "scanned report" or Documentation Flowsheet (discrete results - PFTs) for  Spirometry results.  Patient provided good effort while attempting spirometry.  ? ?Lung Age =  79 ? ? ?A/P: ?Patient has been experiencing cough, tightness in her chest and dyspnea for several weeks and taking prn albuterol approximately 3 days per week.  Additionally, she reports using her symbicort inhaler in place of the albuterol for severe dyspnea 1 day per week.  Medication adherence only for PRN symptom.  ?Spirometry evaluation with patient administered bronchodilator immediately prior to visit revealing near normal lung function despite symptoms of allergic rhinitis and asthma.  ?-continue both symbicort and albuterol MDI    however, the strategy for use was reviewed.  ?Instructed to take Symbicort 1 inhalation TWICE daily.  ?If congestion and cough continue after 1 week, she was instructed to increase Symbicort to 2 inhalations twice daily.  ?Continue to use your albuterol if needed for severe tightness or shortness of breath.  ?-Educated patient on purpose, proper use, potential adverse effects including risk of esophageal candidiasis and need to rinse mouth after each use.  ?-Reviewed results of pulmonary function tests.  ?She may best be managed by SMART therapy wit symbicort only in the future.  ?This would hopefully maximize use of steroid use for respiratory symptoms to minimize risk of severe symptom exacerbation.  ? ? ?For symptoms consistent with allergic rhinitis, I encouraged  her to start taking cetirizine '10mg'$  once daily for the next several weeks.  She was encouraged to stop taking prn benadryl (diphenhydramine).  ? ?Patient verbalized understanding of results and education.  Written pt instructions provided.   ?F/U Rx Clinic visit PRN.   Total time in face to face counseling 48 minutes.   ?

## 2021-07-23 DIAGNOSIS — M9904 Segmental and somatic dysfunction of sacral region: Secondary | ICD-10-CM | POA: Diagnosis not present

## 2021-07-23 DIAGNOSIS — M9905 Segmental and somatic dysfunction of pelvic region: Secondary | ICD-10-CM | POA: Diagnosis not present

## 2021-07-23 DIAGNOSIS — M5386 Other specified dorsopathies, lumbar region: Secondary | ICD-10-CM | POA: Diagnosis not present

## 2021-07-23 DIAGNOSIS — M9903 Segmental and somatic dysfunction of lumbar region: Secondary | ICD-10-CM | POA: Diagnosis not present

## 2021-07-24 NOTE — Telephone Encounter (Signed)
Sent to pcp for request

## 2021-07-27 NOTE — Progress Notes (Signed)
Reviewed: I agree with Dr. Koval's documentation and management. 

## 2021-07-28 ENCOUNTER — Inpatient Hospital Stay: Payer: Medicare Other

## 2021-07-28 ENCOUNTER — Other Ambulatory Visit: Payer: Self-pay

## 2021-07-28 ENCOUNTER — Inpatient Hospital Stay: Payer: Medicare Other | Attending: Nurse Practitioner

## 2021-07-28 DIAGNOSIS — D500 Iron deficiency anemia secondary to blood loss (chronic): Secondary | ICD-10-CM | POA: Insufficient documentation

## 2021-07-28 DIAGNOSIS — D508 Other iron deficiency anemias: Secondary | ICD-10-CM

## 2021-07-28 DIAGNOSIS — Z9884 Bariatric surgery status: Secondary | ICD-10-CM | POA: Insufficient documentation

## 2021-07-28 DIAGNOSIS — E538 Deficiency of other specified B group vitamins: Secondary | ICD-10-CM | POA: Insufficient documentation

## 2021-07-28 DIAGNOSIS — N393 Stress incontinence (female) (male): Secondary | ICD-10-CM | POA: Insufficient documentation

## 2021-07-28 LAB — CBC WITH DIFFERENTIAL (CANCER CENTER ONLY)
Abs Immature Granulocytes: 0.03 10*3/uL (ref 0.00–0.07)
Basophils Absolute: 0.1 10*3/uL (ref 0.0–0.1)
Basophils Relative: 1 %
Eosinophils Absolute: 0.1 10*3/uL (ref 0.0–0.5)
Eosinophils Relative: 2 %
HCT: 40.6 % (ref 36.0–46.0)
Hemoglobin: 13.6 g/dL (ref 12.0–15.0)
Immature Granulocytes: 0 %
Lymphocytes Relative: 34 %
Lymphs Abs: 2.5 10*3/uL (ref 0.7–4.0)
MCH: 28 pg (ref 26.0–34.0)
MCHC: 33.5 g/dL (ref 30.0–36.0)
MCV: 83.5 fL (ref 80.0–100.0)
Monocytes Absolute: 0.4 10*3/uL (ref 0.1–1.0)
Monocytes Relative: 5 %
Neutro Abs: 4.4 10*3/uL (ref 1.7–7.7)
Neutrophils Relative %: 58 %
Platelet Count: 347 10*3/uL (ref 150–400)
RBC: 4.86 MIL/uL (ref 3.87–5.11)
RDW: 15.3 % (ref 11.5–15.5)
WBC Count: 7.5 10*3/uL (ref 4.0–10.5)
nRBC: 0 % (ref 0.0–0.2)

## 2021-07-28 LAB — CMP (CANCER CENTER ONLY)
ALT: 11 U/L (ref 0–44)
AST: 9 U/L — ABNORMAL LOW (ref 15–41)
Albumin: 4 g/dL (ref 3.5–5.0)
Alkaline Phosphatase: 60 U/L (ref 38–126)
Anion gap: 7 (ref 5–15)
BUN: 6 mg/dL (ref 6–20)
CO2: 26 mmol/L (ref 22–32)
Calcium: 9.2 mg/dL (ref 8.9–10.3)
Chloride: 106 mmol/L (ref 98–111)
Creatinine: 0.55 mg/dL (ref 0.44–1.00)
GFR, Estimated: >60 mL/min (ref >60)
Glucose, Bld: 86 mg/dL (ref 70–99)
Potassium: 4.1 mmol/L (ref 3.5–5.1)
Sodium: 139 mmol/L (ref 135–145)
Total Bilirubin: 0.4 mg/dL (ref 0.3–1.2)
Total Protein: 7.9 g/dL (ref 6.5–8.1)

## 2021-07-28 LAB — IRON AND IRON BINDING CAPACITY (CC-WL,HP ONLY)
Iron: 51 ug/dL (ref 28–170)
Saturation Ratios: 12 % (ref 10.4–31.8)
TIBC: 417 ug/dL (ref 250–450)
UIBC: 366 ug/dL (ref 148–442)

## 2021-07-28 LAB — VITAMIN B12: Vitamin B-12: 467 pg/mL (ref 180–914)

## 2021-07-28 LAB — FERRITIN: Ferritin: 44 ng/mL (ref 11–307)

## 2021-07-28 MED ORDER — CYANOCOBALAMIN 1000 MCG/ML IJ SOLN
1000.0000 ug | INTRAMUSCULAR | Status: DC
  Administered 2021-07-28: 1000 ug via INTRAMUSCULAR
  Filled 2021-07-28: qty 1

## 2021-07-30 ENCOUNTER — Ambulatory Visit (HOSPITAL_COMMUNITY): Payer: Medicare Other | Admitting: Licensed Clinical Social Worker

## 2021-07-31 ENCOUNTER — Ambulatory Visit (INDEPENDENT_AMBULATORY_CARE_PROVIDER_SITE_OTHER): Payer: Medicare Other | Admitting: Family Medicine

## 2021-07-31 ENCOUNTER — Encounter: Payer: Self-pay | Admitting: Family Medicine

## 2021-07-31 DIAGNOSIS — J3089 Other allergic rhinitis: Secondary | ICD-10-CM | POA: Diagnosis not present

## 2021-07-31 DIAGNOSIS — J301 Allergic rhinitis due to pollen: Secondary | ICD-10-CM | POA: Diagnosis not present

## 2021-07-31 DIAGNOSIS — Z9889 Other specified postprocedural states: Secondary | ICD-10-CM | POA: Diagnosis not present

## 2021-07-31 DIAGNOSIS — H9312 Tinnitus, left ear: Secondary | ICD-10-CM | POA: Diagnosis not present

## 2021-07-31 DIAGNOSIS — J4550 Severe persistent asthma, uncomplicated: Secondary | ICD-10-CM | POA: Diagnosis not present

## 2021-07-31 MED ORDER — FLUTICASONE PROPIONATE 50 MCG/ACT NA SUSP: 2.0000 | Freq: Every day | NASAL | 6 refills | Status: DC

## 2021-07-31 NOTE — Patient Instructions (Addendum)
It was a pleasure to see you today!  1.  ENT referral: You can call to schedule an appointment at for ENT- Address: [address], [place], [place] [address] Phone: [phone] 2. For asthma and allergies: I recommend changing your air filter every 6-8 weeks and vacuuming carpet at least once weekly (twice if you can manage it). Continue zyrtec once a day. - Start flonase one spray each nostril every morning. This works best when used for 3-4 weeks at a time. Start using it one month before your allergies are bad. 3. Continue using symbicort with 1 puff twice a day  Be Well,  Dr. Chauncey Reading

## 2021-07-31 NOTE — Progress Notes (Signed)
SUBJECTIVE:   CHIEF COMPLAINT / HPI:   Asthma: Patient had uncontrolled asthma given symptoms at last visit more than 3 days a week.  She has Symbicort and albuterol, but was just using albuterol and Symbicort for rescue not using as a daily controller medication.  She had PFTs done recently last week with Dr. Everitt Amber, however she had used albuterol immediately prior to having PFTs which revealed close to normal result.  Unable to tell what baseline is since patient had just used albuterol prior to PFTs.  Today she reports that she has been taking Symbicort with a spacer, 2 puffs twice daily.  She reports that her symptoms especially during the day have improved, however she reports that she still wakes up 1-3 nights a week with cough.  She does note that it is worse during certain times of year, especially spring and fall.  Patient reports that she is worried about becoming "dependent" on using inhalers.  We discussed at length the importance of using a controller medication given her current symptoms.  Allergic rhinitis: Patient on cetirizine 10 mg once daily, reports some improvement in symptoms today. She still feels like she has nasal discharge and feels "tight" in her face and has sinus drainage with coughing at night.  She reports a history of many tree allergies.  She is saw an allergist back in [place] a number of years ago and was told "[she is] allergic to nature."  This has caused her significant quality of life decreased as her children often want to go outside and play or go to the park and go on walks and she feels that she cannot due to her allergic symptoms even with the cetirizine.  She is interested in allergy shots and requests referral to allergist.  We discussed at length her house, she does have some carpet, 1 dog.  Discussed how to change air filter, vacuuming recommendations as well.  Tinnitus: Patient also has hearing implant from 2013, referred to ENT and last visit in  April.  Referral received, unclear if they have called patient yet.  Will give patient contact number to schedule appointment.  PERTINENT  PMH / PSH: SLE, antiphospholipid antibody syndrome, history of pulmonary embolism, morbid obesity, tinnitus, iron deficiency anemia, uncontrolled asthma  OBJECTIVE:   BP 122/69   Pulse 88   Ht '4\' 10"'$  (1.473 m)   Wt 213 lb (96.6 kg)   LMP 03/08/2021   SpO2 97%   BMI 44.52 kg/m   Nursing note and vitals reviewed GEN: Age-appropriate, Latina woman, resting comfortably in chair, NAD, class III obesity HEENT: NCAT. PERRLA. Sclera without injection or icterus. MMM.  Nasal passages bilaterally erythematous and edematous, clear thin secretions.  Clear oropharynx.  TMs bilaterally nonerythematous and nonbulging Neck: Supple.  No LAD. Cardiac: Regular rate and rhythm. Normal S1/S2. No murmurs, rubs, or gallops appreciated. 2+ radial pulses. Lungs: Clear bilaterally to ascultation. No increased WOB, no accessory muscle usage. No w/r/r. Neuro: AOx3  Ext: no edema Psych: Pleasant and appropriate  ASSESSMENT/PLAN:   Tinnitus aurium, left Patient has not heard back from ENT.  Gave her contact information for [facility].  Asthma Improvement in symptoms using Symbicort 2 puffs twice daily.  Continued education with using Symbicort as controller medication, instead of intermittently as needed.  Suspect some of her symptoms are worsened by allergies, see that problem.  Environmental and seasonal allergies Patient is interested in referral to allergist for allergy testing and potentially allergy shots.  Referral placed.  Also recommend Flonase to control nasal symptoms.     [name], MD [place]

## 2021-08-03 DIAGNOSIS — J3089 Other allergic rhinitis: Secondary | ICD-10-CM | POA: Insufficient documentation

## 2021-08-03 NOTE — Assessment & Plan Note (Signed)
Patient has not heard back from ENT.  Gave her contact information for [facility] ENT.

## 2021-08-03 NOTE — Assessment & Plan Note (Signed)
Patient is interested in referral to allergist for allergy testing and potentially allergy shots.  Referral placed.  Also recommend Flonase to control nasal symptoms.

## 2021-08-03 NOTE — Assessment & Plan Note (Signed)
Improvement in symptoms using Symbicort 2 puffs twice daily.  Continued education with using Symbicort as controller medication, instead of intermittently as needed.  Suspect some of her symptoms are worsened by allergies, see that problem.

## 2021-08-04 ENCOUNTER — Encounter (HOSPITAL_COMMUNITY): Payer: Self-pay | Admitting: Psychiatry

## 2021-08-04 ENCOUNTER — Telehealth (HOSPITAL_BASED_OUTPATIENT_CLINIC_OR_DEPARTMENT_OTHER): Payer: Medicare Other | Admitting: Psychiatry

## 2021-08-04 DIAGNOSIS — F4310 Post-traumatic stress disorder, unspecified: Secondary | ICD-10-CM | POA: Diagnosis not present

## 2021-08-04 DIAGNOSIS — F331 Major depressive disorder, recurrent, moderate: Secondary | ICD-10-CM | POA: Diagnosis not present

## 2021-08-04 MED ORDER — PAROXETINE HCL 40 MG PO TABS: 40.0000 mg | ORAL_TABLET | Freq: Every day | ORAL | 0 refills | Status: DC

## 2021-08-04 MED ORDER — TRAZODONE HCL 100 MG PO TABS: 100.0000 mg | ORAL_TABLET | Freq: Every day | ORAL | 0 refills | Status: DC
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
## 2021-08-06 DIAGNOSIS — M9904 Segmental and somatic dysfunction of sacral region: Secondary | ICD-10-CM | POA: Diagnosis not present

## 2021-08-06 DIAGNOSIS — M5386 Other specified dorsopathies, lumbar region: Secondary | ICD-10-CM | POA: Diagnosis not present

## 2021-08-06 DIAGNOSIS — M9903 Segmental and somatic dysfunction of lumbar region: Secondary | ICD-10-CM | POA: Diagnosis not present

## 2021-08-06 DIAGNOSIS — M9905 Segmental and somatic dysfunction of pelvic region: Secondary | ICD-10-CM | POA: Diagnosis not present

## 2021-08-11 ENCOUNTER — Encounter: Payer: Self-pay | Admitting: *Deleted

## 2021-08-13 DIAGNOSIS — M9903 Segmental and somatic dysfunction of lumbar region: Secondary | ICD-10-CM | POA: Diagnosis not present

## 2021-08-13 DIAGNOSIS — M9905 Segmental and somatic dysfunction of pelvic region: Secondary | ICD-10-CM | POA: Diagnosis not present

## 2021-08-13 DIAGNOSIS — M5386 Other specified dorsopathies, lumbar region: Secondary | ICD-10-CM | POA: Diagnosis not present

## 2021-08-13 DIAGNOSIS — M9904 Segmental and somatic dysfunction of sacral region: Secondary | ICD-10-CM | POA: Diagnosis not present

## 2021-08-20 DIAGNOSIS — M9904 Segmental and somatic dysfunction of sacral region: Secondary | ICD-10-CM | POA: Diagnosis not present

## 2021-08-20 DIAGNOSIS — M9903 Segmental and somatic dysfunction of lumbar region: Secondary | ICD-10-CM | POA: Diagnosis not present

## 2021-08-20 DIAGNOSIS — M5386 Other specified dorsopathies, lumbar region: Secondary | ICD-10-CM | POA: Diagnosis not present

## 2021-08-20 DIAGNOSIS — M9905 Segmental and somatic dysfunction of pelvic region: Secondary | ICD-10-CM | POA: Diagnosis not present

## 2021-08-25 ENCOUNTER — Inpatient Hospital Stay: Payer: Medicare Other

## 2021-08-26 DIAGNOSIS — M9905 Segmental and somatic dysfunction of pelvic region: Secondary | ICD-10-CM | POA: Diagnosis not present

## 2021-08-26 DIAGNOSIS — M5386 Other specified dorsopathies, lumbar region: Secondary | ICD-10-CM | POA: Diagnosis not present

## 2021-08-26 DIAGNOSIS — M9903 Segmental and somatic dysfunction of lumbar region: Secondary | ICD-10-CM | POA: Diagnosis not present

## 2021-08-26 DIAGNOSIS — M9904 Segmental and somatic dysfunction of sacral region: Secondary | ICD-10-CM | POA: Diagnosis not present

## 2021-08-27 ENCOUNTER — Inpatient Hospital Stay: Payer: Medicare Other | Attending: Nurse Practitioner

## 2021-08-27 ENCOUNTER — Other Ambulatory Visit: Payer: Self-pay

## 2021-08-27 VITALS — BP 142/86 | HR 95 | Temp 97.5°F | Resp 19

## 2021-08-27 DIAGNOSIS — D508 Other iron deficiency anemias: Secondary | ICD-10-CM

## 2021-08-27 DIAGNOSIS — E538 Deficiency of other specified B group vitamins: Secondary | ICD-10-CM | POA: Diagnosis not present

## 2021-08-27 MED ORDER — CYANOCOBALAMIN 1000 MCG/ML IJ SOLN
1000.0000 ug | INTRAMUSCULAR | Status: DC
  Administered 2021-08-27: 1000 ug via INTRAMUSCULAR
  Filled 2021-08-27: qty 1

## 2021-08-27 NOTE — Patient Instructions (Signed)
Vitamin B12 Deficiency Vitamin B12 deficiency occurs when the body does not have enough of this important vitamin. The body needs this vitamin: To make red blood cells. To make DNA. This is the genetic material inside cells. To help the nerves work properly so they can carry messages from the brain to the body. Vitamin B12 deficiency can cause health problems, such as not having enough red blood cells in the blood (anemia). This can lead to nerve damage if untreated. What are the causes? This condition may be caused by: Not eating enough foods that contain vitamin B12. Not having enough stomach acid and digestive fluids to properly absorb vitamin B12 from the food that you eat. Having certain diseases that make it hard to absorb vitamin B12. These diseases include Crohn's disease, chronic pancreatitis, and cystic fibrosis. An autoimmune disorder in which the body does not make enough of a protein (intrinsic factor) within the stomach, resulting in not enough absorption of vitamin B12. Having a surgery in which part of the stomach or small intestine is removed. Taking certain medicines that make it hard for the body to absorb vitamin B12. These include: Heartburn medicines, such as antacids and proton pump inhibitors. Some medicines that are used to treat diabetes. What increases the risk? The following factors may make you more likely to develop a vitamin B12 deficiency: Being an older adult. Eating a vegetarian or vegan diet that does not include any foods that come from animals. Eating a poor diet while you are pregnant. Taking certain medicines. Having alcoholism. What are the signs or symptoms? In some cases, there are no symptoms of this condition. If the condition leads to anemia or nerve damage, various symptoms may occur, such as: Weakness. Tiredness (fatigue). Loss of appetite. Numbness or tingling in your hands and feet. Redness and burning of the tongue. Depression,  confusion, or memory problems. Trouble walking. If anemia is severe, symptoms can include: Shortness of breath. Dizziness. Rapid heart rate. How is this diagnosed? This condition may be diagnosed with a blood test to measure the level of vitamin B12 in your blood. You may also have other tests, including: A group of tests that measure certain characteristics of blood cells (complete blood count, CBC). A blood test to measure intrinsic factor. A procedure where a thin tube with a camera on the end is used to look into your stomach or intestines (endoscopy). Other tests may be needed to discover the cause of the deficiency. How is this treated? Treatment for this condition depends on the cause. This condition may be treated by: Changing your eating and drinking habits, such as: Eating more foods that contain vitamin B12. Drinking less alcohol or no alcohol. Getting vitamin B12 injections. Taking vitamin B12 supplements by mouth (orally). Your health care provider will tell you which dose is best for you. Follow these instructions at home: Eating and drinking  Include foods in your diet that come from animals and contain a lot of vitamin B12. These include: Meats and poultry. This includes beef, pork, chicken, turkey, and organ meats, such as liver. Seafood. This includes clams, rainbow trout, salmon, tuna, and haddock. Eggs. Dairy foods such as milk, yogurt, and cheese. Eat foods that have vitamin B12 added to them (are fortified), such as ready-to-eat breakfast cereals. Check the label on the package to see if a food is fortified. The items listed above may not be a complete list of foods and beverages you can eat and drink. Contact a dietitian for   more information. Alcohol use Do not drink alcohol if: Your health care provider tells you not to drink. You are pregnant, may be pregnant, or are planning to become pregnant. If you drink alcohol: Limit how much you have to: 0-1 drink a  day for women. 0-2 drinks a day for men. Know how much alcohol is in your drink. In the [place] one drink equals one 12 oz bottle of beer (355 mL), one 5 oz glass of wine (148 mL), or one 1 oz glass of hard liquor (44 mL). General instructions Get vitamin B12 injections if told to by your health care provider. Take supplements only as told by your health care provider. Follow the directions carefully. Keep all follow-up visits. This is important. Contact a health care provider if: Your symptoms come back. Your symptoms get worse or do not improve with treatment. Get help right away: You develop shortness of breath. You have a rapid heart rate. You have chest pain. You become dizzy or you faint. These symptoms may be an emergency. Get help right away. Call 911. Do not wait to see if the symptoms will go away. Do not drive yourself to the hospital. Summary Vitamin B12 deficiency occurs when the body does not have enough of this important vitamin. Common causes include not eating enough foods that contain vitamin B12, not being able to absorb vitamin B12 from the food that you eat, having a surgery in which part of the stomach or small intestine is removed, or taking certain medicines. Eat foods that have vitamin B12 in them. Treatment may include making a change in the way you eat and drink, getting vitamin B12 injections, or taking vitamin B12 supplements. This information is not intended to replace advice given to you by your health care provider. Make sure you discuss any questions you have with your health care provider. Document Revised: 10/17/2020 Document Reviewed: 10/17/2020 Elsevier Patient Education  2023 [employer] Inc[employer]  

## 2021-09-03 DIAGNOSIS — M9903 Segmental and somatic dysfunction of lumbar region: Secondary | ICD-10-CM | POA: Diagnosis not present

## 2021-09-03 DIAGNOSIS — M9904 Segmental and somatic dysfunction of sacral region: Secondary | ICD-10-CM | POA: Diagnosis not present

## 2021-09-03 DIAGNOSIS — M9905 Segmental and somatic dysfunction of pelvic region: Secondary | ICD-10-CM | POA: Diagnosis not present

## 2021-09-03 DIAGNOSIS — M5386 Other specified dorsopathies, lumbar region: Secondary | ICD-10-CM | POA: Diagnosis not present

## 2021-09-11 ENCOUNTER — Telehealth: Payer: Self-pay | Admitting: Neurology

## 2021-09-11 MED ORDER — UBRELVY 100 MG PO TABS: 1.0000 | ORAL_TABLET | ORAL | 1 refills | Status: DC | PRN

## 2021-09-11 MED ORDER — AIMOVIG 140 MG/ML ~~LOC~~ SOAJ: 140.0000 mg | SUBCUTANEOUS | 1 refills | Status: DC

## 2021-09-11 NOTE — Telephone Encounter (Signed)
The following message was left with [employer] on 09/11/21 at 12:06 PM.  Caller received her medicine in the mail for the month and she needs directions on what to do.

## 2021-09-11 NOTE — Telephone Encounter (Signed)
Called patient and she stated that she needs refills on her Aimovig and her Ubrelvy sent to [facility].

## 2021-09-17 ENCOUNTER — Telehealth: Payer: Self-pay | Admitting: Hematology

## 2021-09-17 DIAGNOSIS — M5386 Other specified dorsopathies, lumbar region: Secondary | ICD-10-CM | POA: Diagnosis not present

## 2021-09-17 DIAGNOSIS — M9904 Segmental and somatic dysfunction of sacral region: Secondary | ICD-10-CM | POA: Diagnosis not present

## 2021-09-17 DIAGNOSIS — M9905 Segmental and somatic dysfunction of pelvic region: Secondary | ICD-10-CM | POA: Diagnosis not present

## 2021-09-17 DIAGNOSIS — M9903 Segmental and somatic dysfunction of lumbar region: Secondary | ICD-10-CM | POA: Diagnosis not present

## 2021-09-17 NOTE — Telephone Encounter (Signed)
Rescheduled upcoming appointment due to provider's breast clinic. Patient is aware of changes. 

## 2021-09-18 ENCOUNTER — Encounter (HOSPITAL_COMMUNITY): Payer: Self-pay | Admitting: Internal Medicine

## 2021-09-18 ENCOUNTER — Other Ambulatory Visit: Payer: Self-pay | Admitting: Family Medicine

## 2021-09-22 ENCOUNTER — Inpatient Hospital Stay: Payer: Medicare Other | Attending: Nurse Practitioner

## 2021-09-22 ENCOUNTER — Inpatient Hospital Stay: Payer: Medicare Other

## 2021-09-24 DIAGNOSIS — M5386 Other specified dorsopathies, lumbar region: Secondary | ICD-10-CM | POA: Diagnosis not present

## 2021-09-24 DIAGNOSIS — M9903 Segmental and somatic dysfunction of lumbar region: Secondary | ICD-10-CM | POA: Diagnosis not present

## 2021-09-24 DIAGNOSIS — M9905 Segmental and somatic dysfunction of pelvic region: Secondary | ICD-10-CM | POA: Diagnosis not present

## 2021-09-24 DIAGNOSIS — M9904 Segmental and somatic dysfunction of sacral region: Secondary | ICD-10-CM | POA: Diagnosis not present

## 2021-09-25 ENCOUNTER — Encounter: Payer: Self-pay | Admitting: Internal Medicine

## 2021-09-25 ENCOUNTER — Other Ambulatory Visit: Payer: Self-pay

## 2021-09-25 ENCOUNTER — Ambulatory Visit (INDEPENDENT_AMBULATORY_CARE_PROVIDER_SITE_OTHER): Payer: Medicare Other | Admitting: Internal Medicine

## 2021-09-25 VITALS — BP 112/80 | HR 81 | Temp 97.8°F | Resp 16 | Ht 58.9 in | Wt 209.0 lb

## 2021-09-25 DIAGNOSIS — T7800XA Anaphylactic reaction due to unspecified food, initial encounter: Secondary | ICD-10-CM | POA: Diagnosis not present

## 2021-09-25 DIAGNOSIS — H1045 Other chronic allergic conjunctivitis: Secondary | ICD-10-CM

## 2021-09-25 DIAGNOSIS — H1013 Acute atopic conjunctivitis, bilateral: Secondary | ICD-10-CM

## 2021-09-25 DIAGNOSIS — G43009 Migraine without aura, not intractable, without status migrainosus: Secondary | ICD-10-CM

## 2021-09-25 DIAGNOSIS — J3089 Other allergic rhinitis: Secondary | ICD-10-CM

## 2021-09-25 DIAGNOSIS — K219 Gastro-esophageal reflux disease without esophagitis: Secondary | ICD-10-CM | POA: Diagnosis not present

## 2021-09-25 DIAGNOSIS — J4540 Moderate persistent asthma, uncomplicated: Secondary | ICD-10-CM | POA: Diagnosis not present

## 2021-09-25 MED ORDER — EPINEPHRINE 0.3 MG/0.3ML IJ SOAJ: 0.3000 mg | INTRAMUSCULAR | 1 refills | Status: DC | PRN

## 2021-09-25 MED ORDER — CETIRIZINE HCL 10 MG PO TABS: 10.0000 mg | ORAL_TABLET | Freq: Every day | ORAL | 11 refills | Status: DC

## 2021-09-25 NOTE — Progress Notes (Signed)
New Patient Note  RE: Melissa Gaines MRN: [id number] DOB: November 27, 1970 Date of Office Visit: 09/25/2021  Consult requested by: Lenoria Chime, MD Primary care provider: Lenoria Chime, MD  Chief Complaint: Allergy Testing (Environmental/Known allergy to fish)  History of Present Illness: I had the pleasure of seeing Melissa Gaines for initial evaluation at the [facility] on 09/25/2021. She is a 51 y.o. female, who is referred here by Lenoria Chime, MD for the evaluation of allergic rhinitis, food allergy, urticaria, and asthma .  History obtained from patient  and  chart review  .  Chronic rhinitis: started as a child and worsened as an adult  Symptoms include: rhinorrhea, sneezing, watery eyes, itchy eyes, and itchy nose  Occurs year-round with seasonal flares Potential triggers: pollen season, perfume Treatments tried: benadryl, claritin, flonase (some benefit)  Previous allergy testing: yes History of reflux/heartburn: yes: on omeprazole '20mg'$  (will have breakthrough symptoms at night)  History of chronic sinusitis or sinus surgery: no Nonallergic triggers: strong odors   Concern for Food Allergy:  Food of concern: salmon and tilapia  History of reaction: urticaria, throat tightness, facial angioedema Previous allergy testing no Eats egg, dairy, wheat, soy, shellfish, peanuts, tree nuts, sesame without reactions Carries an epinephrine autoinjector: no Has food allergy action plan no  She does have a history of lactose intolerance   Asthma:  Diagnosed at age as an adult, onset after birth of her second child .  Current symptoms include chest tightness, cough, shortness of breath, and wheezing 1-2/ week daytime symptoms in past month, 0 nighttime awakenings in past month Using rescue inhaler 1-2 times per week, will need it if she has more outdoor exposure  Limitations to daily activity: none 0 ED visits, 0 UC visits and 0  oral steroids in the past year 0 number of lifetime hospitalizations, 0 number of lifetime intubations.  Identified Triggers:  rhinitis , exercise, and respiratory illness Prior PFTs or spirometry: none  to review  Previously used therapies: albuterol, symbicort 171mg .  Current regimen:  Maintenance: symbicort 1637m 2 pufss 1-2 times daily  Rescue: Albuterol 2 puffs q4-6 hrs PRN, not using  prior to exercise  Up-to-date with pneumonia,, Covid-19,, and Flu, vaccines. History of prior pneumonias: 0 History of prior COVID-19 infection: 0 Smoking exposure: denies      Assessment and Plan: [place] a [age].o. female with: Other allergic rhinitis - Plan: Allergy Test, Interdermal Allergy Test, Allergen Immunotherapy  Moderate persistent asthma without complication - Plan: Spirometry with Graph, Allergy Test, Interdermal Allergy Test, Allergen Immunotherapy  Allergy with anaphylaxis due to food - Plan: Allergy Test, Allergy Panel 19, [employer]  Gastroesophageal reflux disease without esophagitis  Migraine without aura and without status migrainosus, not intractable  Other chronic allergic conjunctivitis of both eyes Plan: Patient Instructions  Perennial Rhinitis: not well  controlled  - Testing today showed intradermals were positive to mold  - Copy of test results provided.  - Avoidance measures provided. - Stop taking: Benadryl  - Continue with: Flonase (fluticasone) two sprays per nostril daily - Start taking: Zyrtec (cetirizine) '10mg'$  tablet once daily - You can use an extra dose of the antihistamine, if needed, for breakthrough symptoms.  - Consider nasal saline rinses 1-2 times daily to remove allergens from the nasal cavities as well as help with mucous clearance (this is especially helpful to do before the nasal sprays are given) Start allergy injections. Had a  detailed discussion with patient/family that clinical history is suggestive of allergic rhinitis, and may  benefit from allergy immunotherapy (AIT). Discussed in detail regarding the dosing, schedule, side effects (mild to moderate local allergic reaction and rarely systemic allergic reactions including anaphylaxis/death), alternatives and benefits (significant improvement in nasal symptoms, seasonal flares of asthma) of immunotherapy with the patient. There is significant time commitment involved with allergy shots, which includes weekly immunotherapy injections for first 9-12 months and then biweekly to monthly injections for 3-5 years. Clinical response is often delayed and patient may not see an improvement for 6-12 months. Consent was signed. I have prescribed epinephrine injectable and demonstrated proper use. For mild symptoms you can take over the counter antihistamines such as Benadryl and monitor symptoms closely. If symptoms worsen or if you have severe symptoms including breathing issues, throat closure, significant swelling, whole body hives, severe diarrhea and vomiting, lightheadedness then inject epinephrine and seek immediate medical care afterwards. Action plan given.   Food allergy:  - today's skin testing was negative to seafood, we will get labs to further evaluate for persistent fish allergy - please strictly avoid seafood  - for SKIN only reaction, okay to take Benadryl 1 capsules every 6 hours - for SKIN + ANY additional symptoms, OR IF concern for LIFE THREATENING reaction = Epipen Autoinjector EpiPen 0.3 mg. - If using Epinephrine autoinjector, call 911 - A food allergy action plan has been provided and discussed. - Medic Alert identification is recommended.  Moderate Persistent Asthma: well  controlled - Breathing test today showed: looked great!  PLAN:  - Spacer use reviewed. - Daily controller medication(s): Symbicort 160/4.63mg two puffs once daily - Prior to physical activity: albuterol 2 puffs 10-15 minutes before physical activity. - Rescue medications: albuterol 4  puffs every 4-6 hours as needed - Changes during respiratory infections or worsening symptoms: Increase Symbicort to 3 puffs twice daily for TWO WEEKS. - Asthma control goals:  * Full participation in all desired activities (may need albuterol before activity) * Albuterol use two time or less a week on average (not counting use with activity) * Cough interfering with sleep two time or less a month * Oral steroids no more than once a year * No hospitalizations  Follow up: for initial allergy injection   Thank you so much for letting me partake in your care today.  Don't hesitate to reach out if you have any additional concerns!  ERoney Marion MD  [facility] [place] [place]   No follow-ups on file.  Meds ordered this encounter  Medications   EPINEPHrine 0.3 mg/0.3 mL IJ SOAJ injection    Sig: Inject 0.3 mg into the muscle as needed for anaphylaxis.    Dispense:  1 each    Refill:  1   cetirizine (ZYRTEC) 10 MG tablet    Sig: Take 1 tablet (10 mg total) by mouth daily.    Dispense:  30 tablet    Refill:  11   Lab Orders         Allergy Panel 19, [employer]      Other allergy screening: Asthma: yes Rhino conjunctivitis: yes Food allergy: yes Medication allergy: no Hymenoptera allergy: no Urticaria: yes Eczema:no History of recurrent infections suggestive of immunodeficency: no  Diagnostics: Spirometry:  Tracings reviewed. Her effort: It was hard to get consistent efforts and there is a question as to whether this reflects a maximal maneuver. FVC: 2.07 L FEV1: 2.02 L, 97% predicted FEV1/FVC ratio: 98%  Interpretation: Spirometry consistent with normal pattern.  Please see scanned spirometry results for details.  Skin Testing: Environmental allergy panel and select foods.  Results interpreted by myself and discussed with patient/family.  Airborne Adult Perc - 09/25/21 0945     Time Antigen Placed 3903    Allergen Manufacturer Lavella Hammock     Location Back    Number of Test 59    1. Control-Buffer 50% Glycerol Negative    2. Control-Histamine 1 mg/ml 4+    3. Albumin saline Negative    4. Maxwell Negative    5. [place] Negative    6. Johnson Negative    7. [place] Blue Negative    8. Meadow Fescue Negative    9. Perennial Rye Negative    10. Sweet Vernal Negative    11. Timothy Negative    12. Cocklebur Negative    13. Burweed Marshelder Negative    14. Ragweed, short Negative    15. Ragweed, Giant Negative    16. Plantain,  English Negative    17. Lamb's Quarters Negative    18. Sheep Sorrell Negative    19. Rough Pigweed Negative    20. Marsh Elder, Rough Negative    21. Mugwort, Common Negative    22. Ash mix Negative    23. Birch mix Negative    24. Beech American Negative    25. Box, Elder Negative    26. Cedar, red Negative    27. Cottonwood, [place] Negative    28. Elm mix Negative    29. Hickory Negative    30. Maple mix Negative    31. Oak, [place] mix Negative    32. Pecan Pollen Negative    33. Pine mix Negative    34. Sycamore Eastern Negative    35. Seadrift, Black Pollen Negative    36. Alternaria alternata Negative    37. Cladosporium Herbarum Negative    38. Aspergillus mix Negative    39. Penicillium mix Negative    40. Bipolaris sorokiniana (Helminthosporium) Negative    41. Drechslera spicifera (Curvularia) Negative    42. Mucor plumbeus Negative    43. Fusarium moniliforme Negative    44. Aureobasidium pullulans (pullulara) Negative    45. Rhizopus oryzae Negative    46. Botrytis cinera Negative    47. Epicoccum nigrum Negative    48. Phoma betae Negative    49. Candida Albicans Negative    50. Trichophyton mentagrophytes Negative    51. Mite, D Farinae  5,000 AU/ml Negative    52. Mite, D Pteronyssinus  5,000 AU/ml Negative    53. Cat Hair 10,000 BAU/ml Negative    54.  Dog Epithelia Negative    55. Mixed Feathers Negative    56. Horse Epithelia Negative    57. Cockroach, German  Negative    58. Mouse Negative    59. Tobacco Leaf Negative             Intradermal - 09/25/21 1021     Time Antigen Placed 1026    Allergen Manufacturer Greer    Location Back    Number of Test 15    Control Negative    Guatemala Negative    Johnson Negative    7 Grass Negative    Ragweed mix Negative    Weed mix Negative    Tree mix Negative    Mold 1 3+    Mold 2 3+    Mold 3 3+    Mold 4 3+  Cat Negative    Dog Negative    Cockroach Negative    Mite mix Negative             Food Adult Perc - 09/25/21 0900     Time Antigen Placed 4259    Allergen Manufacturer Lavella Hammock    Location Back    Number of allergen test 14    8. Shellfish Mix Negative    9. Fish Mix Negative    18. Catfish Negative    19. Bass Negative    20. Trout Negative    21. Tuna Negative    22. Salmon Negative    23. Flounder Negative    24. Codfish Negative    25. Shrimp Negative    26. Crab Negative    27. Lobster Negative    28. Oyster Negative    29. 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   Lumbar back pain 01/25/2020   Left hip pain 06/26/2019   Muscle strain of lower extremity, left, initial encounter 06/26/2019   Healthcare maintenance 06/26/2019   Iron deficiency anemia 04/14/2018   Hematuria, gross 03/29/2018   Iron deficiency anemia due to chronic blood loss 03/29/2018   Long term current use of anticoagulant 03/29/2018   Antiphospholipid antibody syndrome ([place]) 01/25/2018   Morbid obesity ([place]) 07/26/2017   Systemic lupus erythematosus ([place]) 07/08/2017   Hx pulmonary embolism 07/08/2017   Chronic pain of right knee 05/23/2017   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/21/2016   Vitamin D deficiency 12/16/2016   Anemia    Migraine     Past Medical History:  Diagnosis Date   Anemia    Anxiety    Asthma    Chronic pain    Congenital hip dislocation    GERD (gastroesophageal reflux disease)    Iron deficiency anemia 04/14/2018   Kidney mass 2017   Migraine    Osteoporosis    Pathological dislocation of shoulder joint, bilateral    congential   PTSD (post-traumatic stress disorder)    Sleep apnea    Systemic lupus erythematosus ([place])    Urticaria    Past Surgical History: Past Surgical History:  Procedure Laterality Date   CESAREAN SECTION  2005   CESAREAN SECTION W/BTL  2010   HIP SURGERY     in childhood for congenital hip dislocation   LAPAROSCOPIC GASTRIC SLEEVE RESECTION  2014   STAPEDECTOMY  11/01/2011   L postauricular stapedectomy with CO2 laser and insertion of 6 x 4.75 mm SMart Piston   TOTAL HIP ARTHROPLASTY     05/2000 R, 11/2000 L   Medication List:  Current Outpatient Medications  Medication Sig Dispense Refill   acetaminophen (TYLENOL) 500 MG tablet Take 500 mg by mouth every 6 (six) hours as needed for headache.     albuterol (PROVENTIL) (2.5 MG/3ML) 0.083% nebulizer solution Take 3 mLs (2.5 mg total) by nebulization every 6 (six) hours as needed for wheezing or shortness of breath. 150 mL 1   apixaban (ELIQUIS) 5 MG TABS tablet Take 1 tablet (5 mg total) by mouth 2 (two) times daily. 60 tablet 11   budesonide-formoterol (SYMBICORT) 160-4.5 MCG/ACT inhaler Inhale 2 puffs into the lungs daily. 1 each 12   cyanocobalamin (,VITAMIN  B-12,) 1000 MCG/ML injection Inject 1 mL (1,000 mcg total) into the skin every 30 (thirty) days. 5 mL 0   cycloSPORINE (RESTASIS) 0.05 % ophthalmic emulsion Place 1 drop into both eyes 2 (two) times daily. 0.4 mL 0   EPINEPHrine 0.3 mg/0.3 mL IJ SOAJ injection Inject 0.3 mg into the muscle as needed for anaphylaxis. 1 each 1   Erenumab-aooe (AIMOVIG) 140 MG/ML SOAJ Inject 140 mg into the skin every 30 (thirty) days. 1.12 mL 1   ferrous sulfate 325 (65 FE) MG tablet  Take 1 tablet (325 mg total) by mouth 3 (three) times daily with meals. 90 tablet 1   fluticasone (FLONASE) 50 MCG/ACT nasal spray Place 2 sprays into both nostrils daily. 16 g 6   gabapentin (NEURONTIN) 100 MG capsule TAKE 3 CAPSULE EVERY NIGHT AT BEDTIME 90 capsule 3   naproxen (NAPROSYN) 500 MG tablet Take 500 mg by mouth 2 (two) times daily.     omeprazole (PRILOSEC) 40 MG capsule TAKE 1 CAPSULE BY MOUTH  DAILY 90 capsule 1   PARoxetine (PAXIL) 40 MG tablet Take 1 tablet (40 mg total) by mouth daily. 90 tablet 0   SYRINGE/NEEDLE, DISP, 1 ML 25G X 5/8" 1 ML MISC Match with B12 3 each 3   traZODone (DESYREL) 100 MG tablet Take 1 tablet (100 mg total) by mouth at bedtime. 90 tablet 0   Ubrogepant (UBRELVY) 100 MG TABS Take 1 tablet by mouth as needed (May repeat in 2 hours.  Maximum 2 tablets in 24 hours). 10 tablet 1   cetirizine (ZYRTEC) 10 MG tablet Take 1 tablet (10 mg total) by mouth daily. 30 tablet 11   PROAIR RESPICLICK 951 (90 Base) MCG/ACT AEPB Inhale 2 puffs into the lungs every 4 (four) hours as needed. (Patient not taking: Reported on 09/25/2021)     Vitamin D, Ergocalciferol, (DRISDOL) 50000 units CAPS capsule Take 1 capsule (50,000 Units total) by mouth every 7 (seven) days. (Patient not taking: Reported on 09/25/2021) 8 capsule 0   No current facility-administered medications for this visit.   Allergies: Allergies  Allergen Reactions   Fish Allergy 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 Throat closes and short of breath.   Social History: Social History   Socioeconomic History   Marital status: Single    Spouse name: Not on file   Number of children: 2   Years of education: some high school   Highest education level: 11th grade  Occupational History   Occupation: Disabled   Tobacco Use   Smoking status: Never    Passive exposure: Past   Smokeless tobacco: Never  Vaping Use   Vaping Use: Never used  Substance and Sexual Activity   Alcohol use: Not Currently     Comment: when she was 51 years old; none after having children    Drug use: No   Sexual activity: Not Currently    Partners: Male    Birth control/protection: Condom, Surgical  Other Topics Concern   Not on file  Social History Narrative   Lives with her 2 children and dog here in [place].   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    She likes to spend time with her children and write poems.   Patient lives in one story home, Right handed    Social Determinants of Health   Financial Resource Strain: High Risk (02/14/2018)   Overall Financial Resource Strain (CARDIA)    Difficulty of Paying  Living Expenses: Very hard  Food Insecurity: Food Insecurity Present (02/14/2018)   Hunger Vital Sign    Worried About Running Out of Food in the Last Year: Often true    Ran Out of Food in the Last Year: Often true  Transportation Needs: Unmet Transportation Needs (12/26/2019)   PRAPARE - Hydrologist (Medical): No    Lack of Transportation (Non-Medical): Yes  Physical Activity: Inactive (02/14/2018)   Exercise Vital Sign    Days of Exercise per Week: 0 days    Minutes of Exercise per Session: 0 min  Stress: Stress Concern Present (02/14/2018)   [place]    Feeling of Stress : To some extent  Social Connections: Unknown (10/30/2018)   Social Connection and Isolation Panel [NHANES]    Frequency of Communication with Friends and Family: More than three times a week    Frequency of Social Gatherings with Friends and Family: Once a week    Attends Religious Services: Not on Advertising copywriter or Organizations: Not on file    Attends Archivist Meetings: Not on file    Marital Status: Not on file   Lives in a single-family home, there are roaches in the house and bed is 2 feet off the floor.  There are dust mite precautions on bed but not pillows.   She is not exposed to fumes, chemicals or dust.  She does not have a HEPA filter in home and home is not near an interstate or industrial area. Smoking: No exposure Occupation: Not currently working  Environmental History: Environmental education officer in the house: no Charity fundraiser in the family room: no Carpet in the bedroom: yes Heating: electric Cooling: central Pet: yes dog with access to bedroom, cat exposure outside home  Family History: Family History  Problem Relation Age of Onset   Asthma Mother    Liver cancer Mother    Heart Problems Mother        heart attack and heart failure   Kidney Stones Mother    Osteoporosis Mother    Stroke Mother    Depression Mother    Anxiety disorder Mother    Kidney Stones Father    Hernia Father    Kidney disease Father    Depression Sister    Anxiety disorder Sister    Asthma Sister    Cancer Maternal Grandmother    Cancer Paternal Grandmother    Cancer Other      ROS: All others negative except as noted per HPI.   Objective: BP 112/80   Pulse 81   Temp 97.8 F (36.6 C) (Temporal)   Resp 16   Ht 4' 10.9" (1.496 m)   Wt 209 lb (94.8 kg)   SpO2 95%   BMI 42.36 kg/m  Body mass index is 42.36 kg/m.  General Appearance:  Alert, cooperative, no distress, appears stated age  Head:  Normocephalic, without obvious abnormality, atraumatic  Eyes:  Conjunctiva clear, EOM's intact  Nose: Nares normal,  erythematous nasal mucosa, hypertrophic turbinates, no visible anterior polyps, and septum midline  Throat: Lips, tongue normal; teeth and gums normal, no tonsillar exudate and + cobblestoning  Neck: Supple, symmetrical  Lungs:   clear to auscultation bilaterally, Respirations unlabored, no coughing  Heart:  regular rate and rhythm and no murmur, Appears well perfused  Extremities: No edema  Skin: Skin color, texture, turgor normal, no rashes or lesions on  visualized portions of skin  Neurologic: No gross deficits   The plan was reviewed  with the patient/family, and all questions/concerned were addressed.  It was my pleasure to see Paizley today and participate in her care. Please feel free to contact me with any questions or concerns.  Sincerely,  Roney Marion, MD Allergy & Immunology  [facility] office: [phone] [facility] office: [phone]

## 2021-09-25 NOTE — Patient Instructions (Signed)
Perennial Rhinitis: not well  controlled  - Testing today showed intradermals were positive to mold  - Copy of test results provided.  - Avoidance measures provided. - Stop taking: Benadryl  - Continue with: Flonase (fluticasone) two sprays per nostril daily - Start taking: Zyrtec (cetirizine) '10mg'$  tablet once daily - You can use an extra dose of the antihistamine, if needed, for breakthrough symptoms.  - Consider nasal saline rinses 1-2 times daily to remove allergens from the nasal cavities as well as help with mucous clearance (this is especially helpful to do before the nasal sprays are given) Start allergy injections. Had a detailed discussion with patient/family that clinical history is suggestive of allergic rhinitis, and may benefit from allergy immunotherapy (AIT). Discussed in detail regarding the dosing, schedule, side effects (mild to moderate local allergic reaction and rarely systemic allergic reactions including anaphylaxis/death), alternatives and benefits (significant improvement in nasal symptoms, seasonal flares of asthma) of immunotherapy with the patient. There is significant time commitment involved with allergy shots, which includes weekly immunotherapy injections for first 9-12 months and then biweekly to monthly injections for 3-5 years. Clinical response is often delayed and patient may not see an improvement for 6-12 months. Consent was signed. I have prescribed epinephrine injectable and demonstrated proper use. For mild symptoms you can take over the counter antihistamines such as Benadryl and monitor symptoms closely. If symptoms worsen or if you have severe symptoms including breathing issues, throat closure, significant swelling, whole body hives, severe diarrhea and vomiting, lightheadedness then inject epinephrine and seek immediate medical care afterwards. Action plan given.   Food allergy:  - today's skin testing was negative to seafood, we will get labs to further  evaluate for persistent fish allergy - please strictly avoid seafood  - for SKIN only reaction, okay to take Benadryl 1 capsules every 6 hours - for SKIN + ANY additional symptoms, OR IF concern for LIFE THREATENING reaction = Epipen Autoinjector EpiPen 0.3 mg. - If using Epinephrine autoinjector, call 911 - A food allergy action plan has been provided and discussed. - Medic Alert identification is recommended.  Moderate Persistent Asthma: well  controlled - Breathing test today showed: looked great!  PLAN:  - Spacer use reviewed. - Daily controller medication(s): Symbicort 160/4.57mg two puffs once daily - Prior to physical activity: albuterol 2 puffs 10-15 minutes before physical activity. - Rescue medications: albuterol 4 puffs every 4-6 hours as needed - Changes during respiratory infections or worsening symptoms: Increase Symbicort to 3 puffs twice daily for TWO WEEKS. - Asthma control goals:  * Full participation in all desired activities (may need albuterol before activity) * Albuterol use two time or less a week on average (not counting use with activity) * Cough interfering with sleep two time or less a month * Oral steroids no more than once a year * No hospitalizations  Follow up: for initial allergy injection   Thank you so much for letting me partake in your care today.  Don't hesitate to reach out if you have any additional concerns!  ERoney Marion MD  [facility] [place]

## 2021-09-28 LAB — ALLERGY PANEL 19, SEAFOOD GROUP
Allergen Salmon IgE: <0.1 kU/L
Catfish: <0.1 kU/L
Codfish IgE: <0.1 kU/L
F023-IgE Crab: <0.1 kU/L
F080-IgE Lobster: <0.1 kU/L
Shrimp IgE: <0.1 kU/L
Tuna: <0.1 kU/L

## 2021-09-28 NOTE — Progress Notes (Signed)
Blood work was negative to all seafood.  I would like to set her up for a salmon challenge if she is interested to see if she has outgrown this allergy.  She would need to bring 3 oz of salmon for the full challenge dose.  Can someone call the patient and gauge interest in salmon challenge?  Thanks!

## 2021-09-28 NOTE — Progress Notes (Signed)
VIALS EXP 09-29-22

## 2021-09-28 NOTE — Progress Notes (Signed)
Aeroallergen Immunotherapy   Ordering Provider: Dr. Roney Marion   Patient Details  Name: Melissa Gaines  MRN: [id number]  Date of Birth: [birth year]   Order 1 of 1   Vial Label: mold   0.2 ml (Volume)  1:20 Concentration -- Alternaria alternata  0.2 ml (Volume)  1:20 Concentration -- Cladosporium herbarum  0.2 ml (Volume)  1:10 Concentration -- Aspergillus mix  0.2 ml (Volume)  1:10 Concentration -- Penicillium mix  0.2 ml (Volume)  1:20 Concentration -- Bipolaris sorokiniana  0.2 ml (Volume)  1:20 Concentration -- Drechslera spicifera  0.2 ml (Volume)  1:10 Concentration -- Mucor plumbeus  0.2 ml (Volume)  1:10 Concentration -- Fusarium moniliforme  0.2 ml (Volume)  1:40 Concentration -- Aureobasidium pullulans  0.2 ml (Volume)  1:10 Concentration -- Rhizopus oryzae    2.0  ml Extract Subtotal  3.0  ml Diluent  5.0  ml Maintenance Total   Schedule:    Blue Vial (1:100,000): Schedule B (6 doses)  Yellow Vial (1:10,000): Schedule B (6 doses)  Green Vial (1:1,000): Schedule B (6 doses)  Red Vial (1:100): Schedule A (10 doses)   Special Instructions: none

## 2021-09-30 DIAGNOSIS — J302 Other seasonal allergic rhinitis: Secondary | ICD-10-CM | POA: Diagnosis not present

## 2021-10-01 ENCOUNTER — Encounter (HOSPITAL_COMMUNITY): Payer: Self-pay | Admitting: Internal Medicine

## 2021-10-01 DIAGNOSIS — M9905 Segmental and somatic dysfunction of pelvic region: Secondary | ICD-10-CM | POA: Diagnosis not present

## 2021-10-01 DIAGNOSIS — M5386 Other specified dorsopathies, lumbar region: Secondary | ICD-10-CM | POA: Diagnosis not present

## 2021-10-01 DIAGNOSIS — M9903 Segmental and somatic dysfunction of lumbar region: Secondary | ICD-10-CM | POA: Diagnosis not present

## 2021-10-01 DIAGNOSIS — M9904 Segmental and somatic dysfunction of sacral region: Secondary | ICD-10-CM | POA: Diagnosis not present

## 2021-10-05 ENCOUNTER — Other Ambulatory Visit (HOSPITAL_COMMUNITY): Payer: Self-pay | Admitting: Psychiatry

## 2021-10-05 DIAGNOSIS — F331 Major depressive disorder, recurrent, moderate: Secondary | ICD-10-CM

## 2021-10-05 DIAGNOSIS — F4310 Post-traumatic stress disorder, unspecified: Secondary | ICD-10-CM

## 2021-10-15 ENCOUNTER — Ambulatory Visit (INDEPENDENT_AMBULATORY_CARE_PROVIDER_SITE_OTHER): Payer: Medicare Other

## 2021-10-15 DIAGNOSIS — J309 Allergic rhinitis, unspecified: Secondary | ICD-10-CM | POA: Diagnosis not present

## 2021-10-15 NOTE — Progress Notes (Signed)
Immunotherapy   Patient Details  Name: Penny Arrambide MRN: [id number] Date of Birth: 1970/08/18  10/15/2021  [name] started injections for  Mold. Patient received 0.05 of her blue vial with an expiration of 09/29/2022. Patient waited 30 minutes with no problems. Following schedule: B  Frequency:1 time per week Epi-Pen:Epi-Pen Available  Consent signed and patient instructions given.   Herbie Drape 10/15/2021, 10:07 AM

## 2021-10-20 ENCOUNTER — Inpatient Hospital Stay: Payer: Medicare Other

## 2021-10-22 ENCOUNTER — Other Ambulatory Visit: Payer: Self-pay | Admitting: Neurology

## 2021-10-22 DIAGNOSIS — H903 Sensorineural hearing loss, bilateral: Secondary | ICD-10-CM | POA: Diagnosis not present

## 2021-10-22 DIAGNOSIS — H9312 Tinnitus, left ear: Secondary | ICD-10-CM | POA: Diagnosis not present

## 2021-10-23 ENCOUNTER — Ambulatory Visit (INDEPENDENT_AMBULATORY_CARE_PROVIDER_SITE_OTHER): Payer: Medicare Other

## 2021-10-23 DIAGNOSIS — J309 Allergic rhinitis, unspecified: Secondary | ICD-10-CM

## 2021-10-26 ENCOUNTER — Ambulatory Visit (INDEPENDENT_AMBULATORY_CARE_PROVIDER_SITE_OTHER): Payer: Medicare Other | Admitting: Internal Medicine

## 2021-10-26 ENCOUNTER — Encounter: Payer: Self-pay | Admitting: Internal Medicine

## 2021-10-26 VITALS — BP 118/72 | HR 66 | Ht 58.9 in | Wt 208.0 lb

## 2021-10-26 DIAGNOSIS — D352 Benign neoplasm of pituitary gland: Secondary | ICD-10-CM | POA: Diagnosis not present

## 2021-10-26 LAB — BASIC METABOLIC PANEL
BUN: 7 mg/dL (ref 6–23)
CO2: 24 mEq/L (ref 19–32)
Calcium: 9 mg/dL (ref 8.4–10.5)
Chloride: 103 mEq/L (ref 96–112)
Creatinine, Ser: 0.43 mg/dL (ref 0.40–1.20)
GFR: 113.78 mL/min (ref >60.00)
Glucose, Bld: 87 mg/dL (ref 70–99)
Potassium: 3.7 mEq/L (ref 3.5–5.1)
Sodium: 139 mEq/L (ref 135–145)

## 2021-10-26 LAB — TSH: TSH: 1.94 u[IU]/mL (ref 0.35–5.50)

## 2021-10-26 LAB — CORTISOL: Cortisol, Plasma: 12.1 ug/dL

## 2021-10-26 LAB — FOLLICLE STIMULATING HORMONE: FSH: 12.4 m[IU]/mL

## 2021-10-26 LAB — LUTEINIZING HORMONE: LH: 5.69 m[IU]/mL

## 2021-10-26 LAB — T4, FREE: Free T4: 0.78 ng/dL (ref 0.60–1.60)

## 2021-10-26 NOTE — Progress Notes (Signed)
Name: Melissa Gaines  MRN/ DOB: [id number], [birth year]    Age/ Sex: 51 y.o., female    PCP: Lenoria Chime, MD   Reason for Endocrinology Evaluation: Pituitary Microadenoma      Date of Initial Endocrinology Evaluation: 10/26/2021     HPI: Melissa Gaines is a 51 y.o. female with a past medical history of SLE, migraine headaches and Hx of PE. The patient presented for initial endocrinology clinic visit on 10/26/2021 for consultative assistance with her Pituitary microadenoma .   During evaluation for left temporal pain she was noted to have a 4 mm pituitary enlargementon brain MRI 09/2020   Substantial weight gain- no Centripetal obesity- no  Easy bruisability-yes Severe hypertension- no  DM- no Proximal muscle weakness- occasional  Fatigue-yes Sudden/ severe headaches- stable , worse with bright lights  Weight loss- yes Palpitations- not sure  Fluid retention- yes Hypokalemia- decades a go  Change in shoe/ring size- no   Has heartburn  Has noted slight visual changes with blurriness , last eye exam was last year  LMP 2 days ago, irregular    HISTORY:  Past Medical History:  Past Medical History:  Diagnosis Date   Anemia    Anxiety    Asthma    Chronic pain    Congenital hip dislocation    GERD (gastroesophageal reflux disease)    Iron deficiency anemia 04/14/2018   Kidney mass 2017   Migraine    Osteoporosis    Pathological dislocation of shoulder joint, bilateral    congential   PTSD (post-traumatic stress disorder)    Sleep apnea    Systemic lupus erythematosus ([facility])    Urticaria    Past Surgical History:  Past Surgical History:  Procedure Laterality Date   CESAREAN SECTION  2005   CESAREAN SECTION W/BTL  2010   HIP SURGERY     in childhood for congenital hip dislocation   LAPAROSCOPIC GASTRIC SLEEVE RESECTION  2014   STAPEDECTOMY  11/01/2011   L postauricular stapedectomy with CO2 laser and insertion of 6 x  4.75 mm SMart Piston   TOTAL HIP ARTHROPLASTY     05/2000 R, 11/2000 L    Social History:  reports that she has never smoked. She has been exposed to tobacco smoke. She has never used smokeless tobacco. She reports that she does not currently use alcohol. She reports that she does not use drugs. Family History: family history includes Anxiety disorder in her mother and sister; Asthma in her mother and sister; Cancer in her maternal grandmother, paternal grandmother, and another family member; Depression in her mother and sister; Heart Problems in her mother; Hernia in her father; Kidney Stones in her father and mother; Kidney disease in her father; Liver cancer in her mother; Osteoporosis in her mother; Stroke in her mother.   HOME MEDICATIONS: Allergies as of 10/26/2021       Reactions   Fish Allergy Anaphylaxis   To salmon and tilapia. Throat closes and short of breath.        Medication List        Accurate as of October 26, 2021  1:05 PM. If you have any questions, ask your nurse or doctor.          acetaminophen 500 MG tablet Commonly known as: TYLENOL Take 500 mg by mouth every 6 (six) hours as needed for headache.   Aimovig 140 MG/ML Soaj Generic drug: Erenumab-aooe Inject 140 mg into the skin every  30 (thirty) days.   albuterol (2.5 MG/3ML) 0.083% nebulizer solution Commonly known as: PROVENTIL Take 3 mLs (2.5 mg total) by nebulization every 6 (six) hours as needed for wheezing or shortness of breath.   ProAir RespiClick 940 (90 Base) MCG/ACT Aepb Generic drug: Albuterol Sulfate Inhale 2 puffs into the lungs every 4 (four) hours as needed.   apixaban 5 MG Tabs tablet Commonly known as: ELIQUIS Take 1 tablet (5 mg total) by mouth 2 (two) times daily.   budesonide-formoterol 160-4.5 MCG/ACT inhaler Commonly known as: Symbicort Inhale 2 puffs into the lungs daily.   cetirizine 10 MG tablet Commonly known as: ZYRTEC Take 1 tablet (10 mg total) by mouth daily.    cyanocobalamin 1000 MCG/ML injection Commonly known as: VITAMIN B12 Inject 1 mL (1,000 mcg total) into the skin every 30 (thirty) days.   EPINEPHrine 0.3 mg/0.3 mL Soaj injection Commonly known as: EPI-PEN Inject 0.3 mg into the muscle as needed for anaphylaxis.   ferrous sulfate 325 (65 FE) MG tablet Take 1 tablet (325 mg total) by mouth 3 (three) times daily with meals.   fluticasone 50 MCG/ACT nasal spray Commonly known as: FLONASE Place 2 sprays into both nostrils daily.   gabapentin 100 MG capsule Commonly known as: NEURONTIN TAKE 3 CAPSULE EVERY NIGHT AT BEDTIME   naproxen 500 MG tablet Commonly known as: NAPROSYN Take 500 mg by mouth 2 (two) times daily.   omeprazole 40 MG capsule Commonly known as: PRILOSEC TAKE 1 CAPSULE BY MOUTH  DAILY   PARoxetine 40 MG tablet Commonly known as: PAXIL Take 1 tablet (40 mg total) by mouth daily.   Restasis 0.05 % ophthalmic emulsion Generic drug: cycloSPORINE Place 1 drop into both eyes 2 (two) times daily.   SYRINGE/NEEDLE (DISP) 1 ML 25G X 5/8" 1 ML Misc Match with B12   traZODone 100 MG tablet Commonly known as: DESYREL Take 1 tablet (100 mg total) by mouth at bedtime.   Ubrelvy 100 MG Tabs Generic drug: Ubrogepant Take 1 tablet by mouth as needed (May repeat in 2 hours.  Maximum 2 tablets in 24 hours).   Vitamin D (Ergocalciferol) 1.25 MG (50000 UNIT) Caps capsule Commonly known as: DRISDOL Take 1 capsule (50,000 Units total) by mouth every 7 (seven) days.          REVIEW OF SYSTEMS: A comprehensive ROS was conducted with the patient and is negative except as per HPI    OBJECTIVE:  VS: BP 118/72 (BP Location: Left Arm, Patient Position: Sitting, Cuff Size: Small)   Pulse 66   Ht 4' 10.9" (1.496 m)   Wt 208 lb (94.3 kg)   SpO2 94%   BMI 42.15 kg/m    Wt Readings from Last 3 Encounters:  10/26/21 208 lb (94.3 kg)  09/25/21 209 lb (94.8 kg)  07/31/21 213 lb (96.6 kg)     EXAM: General: Pt  appears well and is in NAD  Neck: General: Supple without adenopathy. Thyroid: Thyroid size normal.  No goiter or nodules appreciated.  Lungs: Clear with good BS bilat with no rales, rhonchi, or wheezes  Heart: Auscultation: RRR.  Abdomen: Normoactive bowel sounds, soft, nontender, without masses or organomegaly palpable  Extremities:  BL LE: No pretibial edema normal ROM and strength.  Mental Status: Judgment, insight: Intact Orientation: Oriented to time, place, and person Mood and affect: No depression, anxiety, or agitation     DATA REVIEWED:  Latest Reference Range & Units 10/26/21 10:21  Sodium 135 - 145 mEq/L  139  Potassium 3.5 - 5.1 mEq/L 3.7  Chloride 96 - 112 mEq/L 103  CO2 19 - 32 mEq/L 24  Glucose 70 - 99 mg/dL 87  BUN 6 - 23 mg/dL 7  Creatinine 0.40 - 1.20 mg/dL 0.43  Calcium 8.4 - 10.5 mg/dL 9.0  GFR >60.00 mL/min 113.78    Latest Reference Range & Units 10/26/21 10:21  Cortisol, Plasma ug/dL 12.1  LH mIU/mL 5.69  FSH mIU/ML 12.4  Glucose 70 - 99 mg/dL 87  TSH 0.35 - 5.50 uIU/mL 1.94  T4,Free(Direct) 0.60 - 1.60 ng/dL 0.78      Brain MRI 09/12/2020 There is also subtle nodular enlargement and enhancement at the base of the pituitary infundibulum just below the hypothalamus. This is best seen on series 10, image 19 and series 6, image 14, and is not well correlated on the coronal or sagittal images of this exam. This is 4-5 mm. Below this level the infundibulum appears diminutive. The pituitary gland size and configuration is also normal. No other abnormal intracranial enhancement or nodularity. No dural thickening identified.    ASSESSMENT/PLAN/RECOMMENDATIONS:   Pituitary Microadenoma:  -No clinical evidence of hyper or hypopituitarism -We will proceed with repeat pituitary MRI, last MRI in July 2022 showed 4-5 mm growth -Labs show normal BMP, FSH, LH, cortisol and TFTs -IGF-1, and prolactin are pending  Follow-up in 1 year    Signed  electronically by: Mack Guise, MD  [facility]  [facility] [address] [place],  [address] Phone: [phone] FAX: [phone]   CC: Lenoria Chime, [place] [address] Phone: [phone] Fax: [phone]   Return to Endocrinology clinic as below: Future Appointments  Date Time Provider [place]  10/27/2021  9:45 AM [facility] [place] None  11/02/2021 10:10 AM Metta Clines R, DO [facility] None  11/03/2021 10:20 AM Arfeen, Arlyce Harman, MD [facility] None  11/18/2021 10:45 AM [facility] None  11/18/2021 11:20 AM Truitt Merle, MD [facility] None  11/18/2021 12:15 PM [facility] [place] [facility] None  10/21/2022 10:30 AM Destinee Taber, Melanie Crazier, MD [facility] None

## 2021-10-27 ENCOUNTER — Other Ambulatory Visit: Payer: Self-pay

## 2021-10-27 ENCOUNTER — Inpatient Hospital Stay: Payer: Medicare Other | Attending: Nurse Practitioner

## 2021-10-27 DIAGNOSIS — D508 Other iron deficiency anemias: Secondary | ICD-10-CM

## 2021-10-27 DIAGNOSIS — E538 Deficiency of other specified B group vitamins: Secondary | ICD-10-CM | POA: Insufficient documentation

## 2021-10-27 MED ORDER — CYANOCOBALAMIN 1000 MCG/ML IJ SOLN
1000.0000 ug | INTRAMUSCULAR | Status: DC
  Administered 2021-10-27: 1000 ug via INTRAMUSCULAR
  Filled 2021-10-27: qty 1

## 2021-10-29 NOTE — Progress Notes (Signed)
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   Migraine rescue:  Roselyn Meier '100mg'$  Limit use of pain relievers to no more than 2 days out of week to prevent risk of rebound or medication-overuse headache. Keep headache diary Follow up 12 months.   Subjective:  Melissa Gaines is a 51 year old woman with PTSD, anxiety, chronic dizziness, migraines, SLE and antiphospholipid antibody syndrome with history of PE on chronic anticoagulation who follows up for migraines.   UPDATE: Restarted Aimovig. Intensity:  usually mild, sometimes moderate Duration:  With Ubrelvy, lasted 60 to 90 minutes.   Frequency:  2-3 days a month.  Referred to endocrinology who has repeated labs and ordered repeat MRI.    Current NSAIDS:  Tylenol, naproxen (most days a week) Current analgesics:  None Current triptans:  none Current ergotamine:  none Current anti-emetic:  none Current muscle relaxants:  none Current anti-anxiolytic:  hydroxyzine Current sleep aide:  none Current Antihypertensive medications:  metoprolol Current Antidepressant medications:  Paxil '40mg'$  Current Anticonvulsant medications: gabapentin '300mg'$  QHS Current anti-CGRP:  Aimovig '140mg'$ , Ubrelvy '100mg'$  Current Vitamins/Herbal/Supplements:  D, folic acid, Y60 Current Antihistamines/Decongestants:  none Other therapy:  Icy-cold  Hormone/birth control:  none Other medications:  Eliquis, methotrexate   Caffeine:  No coffee.  Pepsi daily. Alcohol:  no Smoker:  no Diet:  4 glasses of water daily.  Skips meals Exercise:  no Depression:  yes; Anxiety:  yes Other  pain:  Joint pain, back pain, lumbar radicular pain Sleep hygiene:  varies   HISTORY:  Onset:  Mild migraines in 1990s but worse since birth of son in 2010 Location:  Left temple radiating down to back of head Quality:  pounding, shocks Initial Intensity:  Severe.  She denies new headache, thunderclap headache Aura:  no Premonitory Phase:  no Postdrome:  no Associated symptoms:  Photophobia, phonophobia, dizziness, blurred vision, stuttering speech. If severe, she has memory deficits such as difficulty spelling her name or remembering her social security number.  She denies associated nausea, vomiting,  unilateral numbness or weakness. Initial Duration:  Varies.  1 hour to several hours.  Last month, she had one that lasted 5 days. Initial Frequency:  Daily Initial Frequency of abortive medication: ASA and Tylenol daily Triggers:  bright sunlight Relieving factors:  Resting in dark quiet room, cold packs Activity:  aggravates   She reports increased electric shock pain in the left temple radiating to back of head, lasting 1 to hours with photophobia and phonophobia, occurring twice a month.   Past NSAIDS:  ibuprofen, ASA '325mg'$  Past analgesics:  Extra-strength Tylenol Past abortive triptans:  rizatriptan, sumatriptan tab Past abortive ergotamine:  none Past muscle relaxants:  none Past anti-emetic:  none Past antihypertensive medications:  none Past antidepressant medications:  none Past anticonvulsant medications:  topiramate '100mg'$  Past anti-CGRP:  none Past vitamins/Herbal/Supplements:  none Past antihistamines/decongestants:  none Other past therapies:  none   Family history of headache:  Mom   Of note, she was told she had a 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  However, she followed up with another neurologist in [place] who performed a repeat brain MRI and was  told that it was gone.  MRI of brain with and without contrast on 09/12/2020 showed subtle 4-5 mm  nodularity at base of pituitary infundibulum possibly reflecting current or previous hypophysitis.  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  Otherwise, brain and pituitary gland unremarkable.  PAST MEDICAL HISTORY: Past Medical History:  Diagnosis Date   Anemia    Anxiety    Asthma    Chronic pain    Congenital hip dislocation    GERD (gastroesophageal reflux disease)    Iron deficiency anemia 04/14/2018   Kidney mass 2017   Migraine    Osteoporosis    Pathological dislocation of shoulder joint, bilateral    congential   PTSD (post-traumatic stress disorder)    Sleep apnea    Systemic lupus erythematosus ([facility])    Urticaria     MEDICATIONS: Current Outpatient Medications on File Prior to Visit  Medication Sig Dispense Refill   acetaminophen (TYLENOL) 500 MG tablet Take 500 mg by mouth every 6 (six) hours as needed for headache.     albuterol (PROVENTIL) (2.5 MG/3ML) 0.083% nebulizer solution Take 3 mLs (2.5 mg total) by nebulization every 6 (six) hours as needed for wheezing or shortness of breath. 150 mL 1   apixaban (ELIQUIS) 5 MG TABS tablet Take 1 tablet (5 mg total) by mouth 2 (two) times daily. 60 tablet 11   budesonide-formoterol (SYMBICORT) 160-4.5 MCG/ACT inhaler Inhale 2 puffs into the lungs daily. 1 each 12   cetirizine (ZYRTEC) 10 MG tablet Take 1 tablet (10 mg total) by mouth daily. 30 tablet 11   cyanocobalamin (,VITAMIN B-12,) 1000 MCG/ML injection Inject 1 mL (1,000 mcg total) into the skin every 30 (thirty) days. 5 mL 0   cycloSPORINE (RESTASIS) 0.05 % ophthalmic emulsion Place 1 drop into both eyes 2 (two) times daily. 0.4 mL 0   EPINEPHrine 0.3 mg/0.3 mL IJ SOAJ injection Inject 0.3 mg into the muscle as needed for anaphylaxis. 1 each 1   Erenumab-aooe (AIMOVIG) 140 MG/ML SOAJ Inject 140 mg into the skin every 30 (thirty) days. 1.12 mL 1   ferrous sulfate 325 (65 FE) MG tablet Take 1 tablet (325 mg total) by mouth 3 (three)  times daily with meals. 90 tablet 1   fluticasone (FLONASE) 50 MCG/ACT nasal spray Place 2 sprays into both nostrils daily. 16 g 6   gabapentin (NEURONTIN) 100 MG capsule TAKE 3 CAPSULE EVERY NIGHT AT BEDTIME 90 capsule 3   naproxen (NAPROSYN) 500 MG tablet Take 500 mg by mouth 2 (two) times daily.     omeprazole (PRILOSEC) 40 MG capsule TAKE 1 CAPSULE BY MOUTH  DAILY 90 capsule 1   PARoxetine (PAXIL) 40 MG tablet Take 1 tablet (40 mg total) by mouth daily. 90 tablet 0   PROAIR RESPICLICK 426 (90 Base) MCG/ACT AEPB Inhale 2 puffs into the lungs every 4 (four) hours as needed.     SYRINGE/NEEDLE, DISP, 1 ML 25G X 5/8" 1 ML MISC Match with B12 3 each 3   traZODone (DESYREL) 100 MG tablet Take 1 tablet (100 mg total) by mouth at bedtime. 90 tablet 0   Ubrogepant (UBRELVY) 100 MG TABS Take 1 tablet by mouth as needed (May repeat in 2 hours.  Maximum 2 tablets in 24 hours). 10 tablet 1   Vitamin D, Ergocalciferol, (DRISDOL) 50000 units CAPS capsule Take 1 capsule (50,000 Units total) by mouth every 7 (seven) days. 8 capsule 0   No current facility-administered medications on  file prior to visit.    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 Throat closes and short of breath.    FAMILY HISTORY: Family History  Problem Relation Age of Onset   Asthma Mother    Liver cancer Mother    Heart Problems Mother        heart attack and heart failure   Kidney Stones Mother    Osteoporosis Mother    Stroke Mother    Depression Mother    Anxiety disorder Mother    Kidney Stones Father    Hernia Father    Kidney disease Father    Depression Sister    Anxiety disorder Sister    Asthma Sister    Cancer Maternal Grandmother    Cancer Paternal Grandmother    Cancer Other       Objective:  Blood pressure 118/68, pulse 75, height '4\' 10"'$  (1.473 m), weight 207 lb (93.9 kg), SpO2 95 %. General: No acute distress.  Patient appears well-groomed.       Metta Clines, DO  CC: [name], MD

## 2021-10-31 LAB — ESTRADIOL: Estradiol: 20 pg/mL

## 2021-10-31 LAB — INSULIN-LIKE GROWTH FACTOR
IGF-I, LC/MS: 59 ng/mL (ref 52–328)
Z-Score (Female): -1.7 SD (ref ?–2.0)

## 2021-10-31 LAB — PROLACTIN: Prolactin: 3.7 ng/mL

## 2021-10-31 LAB — ACTH: C206 ACTH: 26 pg/mL (ref 6–50)

## 2021-11-02 ENCOUNTER — Ambulatory Visit (INDEPENDENT_AMBULATORY_CARE_PROVIDER_SITE_OTHER): Payer: Medicare Other | Admitting: Neurology

## 2021-11-02 ENCOUNTER — Encounter: Payer: Self-pay | Admitting: Neurology

## 2021-11-02 VITALS — BP 118/68 | HR 75 | Ht <= 58 in | Wt 207.0 lb

## 2021-11-02 DIAGNOSIS — E237 Disorder of pituitary gland, unspecified: Secondary | ICD-10-CM | POA: Diagnosis not present

## 2021-11-02 DIAGNOSIS — G43009 Migraine without aura, not intractable, without status migrainosus: Secondary | ICD-10-CM | POA: Diagnosis not present

## 2021-11-02 DIAGNOSIS — M5481 Occipital neuralgia: Secondary | ICD-10-CM

## 2021-11-02 MED ORDER — AIMOVIG 140 MG/ML ~~LOC~~ SOAJ: 140.0000 mg | SUBCUTANEOUS | 11 refills | Status: DC

## 2021-11-02 MED ORDER — UBRELVY 100 MG PO TABS
1.0000 | ORAL_TABLET | ORAL | 11 refills | Status: DC | PRN
Start: 2021-11-02 — End: 2022-11-03

## 2021-11-02 NOTE — Patient Instructions (Signed)
Refilled aimovig Refilled Roselyn Meier

## 2021-11-03 ENCOUNTER — Encounter (HOSPITAL_COMMUNITY): Payer: Self-pay | Admitting: Psychiatry

## 2021-11-03 ENCOUNTER — Telehealth (HOSPITAL_BASED_OUTPATIENT_CLINIC_OR_DEPARTMENT_OTHER): Payer: Medicare Other | Admitting: Psychiatry

## 2021-11-03 DIAGNOSIS — F4310 Post-traumatic stress disorder, unspecified: Secondary | ICD-10-CM | POA: Diagnosis not present

## 2021-11-03 DIAGNOSIS — F331 Major depressive disorder, recurrent, moderate: Secondary | ICD-10-CM | POA: Diagnosis not present

## 2021-11-03 MED ORDER — PAROXETINE HCL 40 MG PO TABS: 40.0000 mg | ORAL_TABLET | Freq: Every day | ORAL | 0 refills | Status: DC

## 2021-11-03 MED ORDER — TRAZODONE HCL 100 MG PO TABS: 100.0000 mg | ORAL_TABLET | Freq: Every day | ORAL | 0 refills | Status: DC
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
## 2021-11-05 ENCOUNTER — Ambulatory Visit (INDEPENDENT_AMBULATORY_CARE_PROVIDER_SITE_OTHER): Payer: Medicare Other

## 2021-11-05 DIAGNOSIS — J309 Allergic rhinitis, unspecified: Secondary | ICD-10-CM

## 2021-11-12 ENCOUNTER — Ambulatory Visit (INDEPENDENT_AMBULATORY_CARE_PROVIDER_SITE_OTHER): Payer: Medicare Other

## 2021-11-12 DIAGNOSIS — J309 Allergic rhinitis, unspecified: Secondary | ICD-10-CM

## 2021-11-17 ENCOUNTER — Other Ambulatory Visit: Payer: Medicare Other

## 2021-11-18 ENCOUNTER — Inpatient Hospital Stay: Payer: Medicare Other

## 2021-11-18 ENCOUNTER — Inpatient Hospital Stay: Payer: Medicare Other | Attending: Nurse Practitioner

## 2021-11-18 ENCOUNTER — Telehealth: Payer: Self-pay | Admitting: *Deleted

## 2021-11-18 ENCOUNTER — Other Ambulatory Visit: Payer: Self-pay

## 2021-11-18 ENCOUNTER — Inpatient Hospital Stay (HOSPITAL_BASED_OUTPATIENT_CLINIC_OR_DEPARTMENT_OTHER): Payer: Medicare Other | Admitting: Hematology

## 2021-11-18 ENCOUNTER — Encounter: Payer: Self-pay | Admitting: Hematology

## 2021-11-18 ENCOUNTER — Other Ambulatory Visit: Payer: Self-pay | Admitting: Family Medicine

## 2021-11-18 ENCOUNTER — Inpatient Hospital Stay: Payer: Medicare Other | Admitting: Hematology

## 2021-11-18 VITALS — BP 113/51 | HR 81 | Temp 98.4°F | Ht <= 58 in | Wt 208.3 lb

## 2021-11-18 DIAGNOSIS — M329 Systemic lupus erythematosus, unspecified: Secondary | ICD-10-CM | POA: Insufficient documentation

## 2021-11-18 DIAGNOSIS — N920 Excessive and frequent menstruation with regular cycle: Secondary | ICD-10-CM | POA: Diagnosis not present

## 2021-11-18 DIAGNOSIS — N393 Stress incontinence (female) (male): Secondary | ICD-10-CM | POA: Insufficient documentation

## 2021-11-18 DIAGNOSIS — D508 Other iron deficiency anemias: Secondary | ICD-10-CM | POA: Insufficient documentation

## 2021-11-18 DIAGNOSIS — G43909 Migraine, unspecified, not intractable, without status migrainosus: Secondary | ICD-10-CM | POA: Diagnosis not present

## 2021-11-18 DIAGNOSIS — D6861 Antiphospholipid syndrome: Secondary | ICD-10-CM | POA: Insufficient documentation

## 2021-11-18 DIAGNOSIS — K909 Intestinal malabsorption, unspecified: Secondary | ICD-10-CM | POA: Insufficient documentation

## 2021-11-18 DIAGNOSIS — Z7901 Long term (current) use of anticoagulants: Secondary | ICD-10-CM | POA: Insufficient documentation

## 2021-11-18 DIAGNOSIS — D500 Iron deficiency anemia secondary to blood loss (chronic): Secondary | ICD-10-CM

## 2021-11-18 DIAGNOSIS — E538 Deficiency of other specified B group vitamins: Secondary | ICD-10-CM | POA: Diagnosis not present

## 2021-11-18 DIAGNOSIS — J4550 Severe persistent asthma, uncomplicated: Secondary | ICD-10-CM

## 2021-11-18 LAB — IRON AND IRON BINDING CAPACITY (CC-WL,HP ONLY)
Iron: 39 ug/dL (ref 28–170)
Saturation Ratios: 9 % — ABNORMAL LOW (ref 10.4–31.8)
TIBC: 427 ug/dL (ref 250–450)
UIBC: 388 ug/dL (ref 148–442)

## 2021-11-18 LAB — CBC WITH DIFFERENTIAL (CANCER CENTER ONLY)
Abs Immature Granulocytes: 0.02 10*3/uL (ref 0.00–0.07)
Basophils Absolute: 0 10*3/uL (ref 0.0–0.1)
Basophils Relative: 1 %
Eosinophils Absolute: 0.2 10*3/uL (ref 0.0–0.5)
Eosinophils Relative: 2 %
HCT: 42.6 % (ref 36.0–46.0)
Hemoglobin: 14.2 g/dL (ref 12.0–15.0)
Immature Granulocytes: 0 %
Lymphocytes Relative: 35 %
Lymphs Abs: 2.5 10*3/uL (ref 0.7–4.0)
MCH: 28 pg (ref 26.0–34.0)
MCHC: 33.3 g/dL (ref 30.0–36.0)
MCV: 84 fL (ref 80.0–100.0)
Monocytes Absolute: 0.4 10*3/uL (ref 0.1–1.0)
Monocytes Relative: 5 %
Neutro Abs: 4.3 10*3/uL (ref 1.7–7.7)
Neutrophils Relative %: 57 %
Platelet Count: 353 10*3/uL (ref 150–400)
RBC: 5.07 MIL/uL (ref 3.87–5.11)
RDW: 15.7 % — ABNORMAL HIGH (ref 11.5–15.5)
WBC Count: 7.4 10*3/uL (ref 4.0–10.5)
nRBC: 0 % (ref 0.0–0.2)

## 2021-11-18 LAB — FERRITIN: Ferritin: 26 ng/mL (ref 11–307)

## 2021-11-18 LAB — VITAMIN B12: Vitamin B-12: 472 pg/mL (ref 180–914)

## 2021-11-18 MED ORDER — CYANOCOBALAMIN 1000 MCG/ML IJ SOLN
1000.0000 ug | INTRAMUSCULAR | Status: DC
  Administered 2021-11-18: 1000 ug via INTRAMUSCULAR
  Filled 2021-11-18: qty 1

## 2021-11-18 MED ORDER — ALBUTEROL SULFATE HFA 108 (90 BASE) MCG/ACT IN AERS: 2.0000 | INHALATION_SPRAY | Freq: Four times a day (QID) | RESPIRATORY_TRACT | 2 refills | Status: AC | PRN

## 2021-11-18 NOTE — Telephone Encounter (Signed)
Received fax from [email or web address] stating that pts albuterol HFA INH rx has expired, please refill if appropriate.  Looks like all recent rxs went to [facility]. Natacia Chaisson Zimmerman Rumple, CMA

## 2021-11-18 NOTE — Progress Notes (Signed)
[place]   [phone] Fax:(336) [phone]   Clinic Follow up Note   Patient Care Team: Lenoria Chime, MD as PCP - General (Family Medicine) Buford Dresser, MD as PCP - Cardiology (Cardiology) Pieter Partridge, DO as Consulting Physician (Neurology)  Date of Service:  11/18/2021  CHIEF COMPLAINT: f/u of anemia  CURRENT THERAPY:  -IV Feraheme 510 mg PRN goal ferritin >50, last given 06/2020 -Oral ferrous sulfate -Apixaban 5 mg BID  -B12 injection 1000 mcg monthly starting 10/2018  ASSESSMENT & PLAN:  Melissa Gaines is a 51 y.o. female with   1. 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 She has required 5 doses IV Feraheme since 04/2018 to keep goal ferritin >50, last 06/2020. -she is currently taking oral iron TID. I encouraged her to go down to once a day since she has been taking it for so long. -she is clinically stable. Lab reviewed, iron serum 39, CBC is WNL. Ferritin and B12 are pending. Plan for IV Feraheme PRN to keep ferritin greater than 50, we will call her to set up if needed.   2. B12 deficiency, secondary to previous gastric bypass surgery -developed symptomatic deficiency in 10/2018, started monthly B12 inj. Level remains in normal range -continue monthly injections at [facility], her insurance does not cover home administration   3.  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   INTERVAL HISTORY:  Melissa Gaines is here for a follow up of anemia. She was last seen by NP Lacie on 06/02/21. She presents to the clinic alone. She reports fatigue but notes she is able to do what she needs to do.   All other systems were reviewed with the patient and are negative.  MEDICAL HISTORY:  Past Medical History:  Diagnosis Date   Anemia    Anxiety    Asthma    Chronic pain    Congenital hip dislocation    GERD (gastroesophageal reflux disease)    Iron deficiency anemia 04/14/2018   Kidney mass 2017   Migraine    Osteoporosis    Pathological dislocation of shoulder joint, bilateral    congential   PTSD (post-traumatic stress disorder)    Sleep apnea    Systemic lupus erythematosus ([place])    Urticaria     SURGICAL HISTORY: Past Surgical History:  Procedure Laterality Date   CESAREAN SECTION  2005   CESAREAN SECTION W/BTL  2010   HIP SURGERY     in childhood for congenital hip dislocation   LAPAROSCOPIC GASTRIC SLEEVE RESECTION  2014   STAPEDECTOMY  11/01/2011   L postauricular stapedectomy with CO2 laser and insertion of 6 x 4.75 mm SMart Piston   TOTAL HIP ARTHROPLASTY     05/2000 R, 11/2000 L    I have reviewed the social history and family history with the patient and they are unchanged from previous note.  ALLERGIES:  is allergic to fish allergy.  MEDICATIONS:  Current Outpatient Medications  Medication Sig Dispense Refill   acetaminophen (TYLENOL) 500 MG tablet Take 500 mg by mouth every 6 (six) hours as needed for headache.     albuterol (PROVENTIL) (2.5 MG/3ML) 0.083% nebulizer solution Take 3 mLs (2.5 mg total) by nebulization every 6 (six) hours as needed for wheezing or shortness of breath. 150 mL 1   albuterol (VENTOLIN HFA) 108 (90 Base) MCG/ACT inhaler Inhale 2 puffs into the lungs every 6 (six) hours as needed for wheezing or shortness of  breath. 8 g 2   apixaban (ELIQUIS) 5 MG TABS tablet Take 1 tablet (5 mg total) by mouth 2 (two) times daily. 60 tablet 11   budesonide-formoterol (SYMBICORT) 160-4.5 MCG/ACT inhaler Inhale 2 puffs into the lungs daily. 1 each 12   cetirizine (ZYRTEC) 10 MG tablet Take 1 tablet (10 mg total) by mouth daily. 30 tablet 11   cyanocobalamin (,VITAMIN B-12,) 1000 MCG/ML injection Inject 1 mL (1,000 mcg total) into the skin every 30 (thirty) days. 5 mL 0   cycloSPORINE (RESTASIS) 0.05 % ophthalmic emulsion Place 1 drop into both eyes 2 (two) times daily. 0.4 mL 0   EPINEPHrine 0.3 mg/0.3 mL IJ SOAJ injection Inject 0.3 mg into the muscle as needed for anaphylaxis. 1 each 1   Erenumab-aooe (AIMOVIG) 140 MG/ML SOAJ Inject 140 mg into the skin every 30 (thirty) days. 1.12 mL 11   ferrous sulfate 325 (65 FE) MG tablet Take 1 tablet (325 mg total) by mouth 3 (three) times daily with meals. 90 tablet 1   fluticasone (FLONASE) 50 MCG/ACT nasal spray Place 2 sprays into both nostrils daily. 16 g 6   gabapentin (NEURONTIN) 100 MG capsule TAKE 3 CAPSULE EVERY NIGHT AT BEDTIME 90 capsule 3   naproxen (NAPROSYN) 500 MG tablet Take 500 mg by mouth 2 (two) times daily.     omeprazole (PRILOSEC) 40 MG capsule TAKE 1 CAPSULE BY MOUTH  DAILY 90 capsule 1   PARoxetine (PAXIL) 40 MG tablet Take 1 tablet (40 mg total) by mouth daily. 90 tablet 0   PROAIR RESPICLICK 884 (90 Base) MCG/ACT AEPB Inhale 2 puffs into the lungs every 4 (four) hours as needed.     SYRINGE/NEEDLE, DISP, 1 ML 25G X 5/8" 1 ML MISC Match with B12 3 each 3   traZODone (DESYREL) 100 MG tablet Take 1 tablet (100 mg total) by mouth at bedtime. 90 tablet 0   Ubrogepant (UBRELVY) 100 MG TABS Take 1 tablet by mouth as needed (May repeat in 2 hours.  Maximum 2 tablets in 24 hours). 10 tablet 11   Vitamin D, Ergocalciferol, (DRISDOL) 50000 units CAPS capsule Take 1 capsule (50,000 Units total) by mouth every 7 (seven) days. 8 capsule 0   No current  facility-administered medications for this visit.    PHYSICAL EXAMINATION: ECOG PERFORMANCE STATUS: 0 - Asymptomatic  Vitals:   11/18/21 1056  BP: (!) 113/51  Pulse: 81  Temp: 98.4 F (36.9 C)  SpO2: 94%   Wt Readings from Last 3 Encounters:  11/18/21 208 lb 4.8 oz (94.5 kg)  11/02/21 207 lb (93.9 kg)  10/26/21 208 lb (94.3 kg)     GENERAL:alert, no distress and comfortable SKIN: skin color normal, no rashes or significant lesions EYES: normal, Conjunctiva are pink and non-injected, sclera clear  NEURO: alert & oriented x 3 with fluent speech  LABORATORY DATA:  I have reviewed the data as listed  Latest Ref Rng & Units 11/18/2021   10:25 AM 07/28/2021   12:44 PM 06/02/2021    8:40 AM  CBC  WBC 4.0 - 10.5 K/uL 7.4  7.5  7.8   Hemoglobin 12.0 - 15.0 g/dL 14.2  13.6  14.2   Hematocrit 36.0 - 46.0 % 42.6  40.6  42.8   Platelets 150 - 400 K/uL 353  347  341         Latest Ref Rng & Units 10/26/2021   10:21 AM 07/28/2021   12:44 PM 07/01/2021    2:57 PM  CMP  Glucose 70 - 99 mg/dL 87  86  79   BUN 6 - 23 mg/dL '7  6  8   '$ Creatinine 0.40 - 1.20 mg/dL 0.43  0.55  0.58   Sodium 135 - 145 mEq/L 139  139  142   Potassium 3.5 - 5.1 mEq/L 3.7  4.1  4.3   Chloride 96 - 112 mEq/L 103  106  103   CO2 19 - 32 mEq/L '24  26  24   '$ Calcium 8.4 - 10.5 mg/dL 9.0  9.2  9.8   Total Protein 6.5 - 8.1 g/dL  7.9  7.8   Total Bilirubin 0.3 - 1.2 mg/dL  0.4  <0.2   Alkaline Phos 38 - 126 U/L  60  80   AST 15 - 41 U/L  9  18   ALT 0 - 44 U/L  11  17       RADIOGRAPHIC STUDIES: I have personally reviewed the radiological images as listed and agreed with the findings in the report. No results found.    No orders of the defined types were placed in this encounter.  All questions were answered. The patient knows to call the clinic with any problems, questions or concerns. No barriers to learning was detected. The total time spent in the appointment was 20 minutes.     Truitt Merle,  MD 11/18/2021   I, Wilburn Mylar, am acting as scribe for Truitt Merle, MD.   I have reviewed the above documentation for accuracy and completeness, and I agree with the above.

## 2021-11-19 ENCOUNTER — Ambulatory Visit
Admission: RE | Admit: 2021-11-19 | Discharge: 2021-11-19 | Disposition: A | Discharge status: Home / Self Care | Payer: Medicare Other | Source: Ambulatory Visit | Attending: Internal Medicine | Admitting: Internal Medicine

## 2021-11-19 DIAGNOSIS — D352 Benign neoplasm of pituitary gland: Secondary | ICD-10-CM

## 2021-11-19 MED ORDER — GADOBENATE DIMEGLUMINE 529 MG/ML IV SOLN
8.0000 mL | Freq: Once | INTRAVENOUS | Status: AC | PRN
  Administered 2021-11-19: 8 mL via INTRAVENOUS

## 2021-11-24 DIAGNOSIS — H25813 Combined forms of age-related cataract, bilateral: Secondary | ICD-10-CM | POA: Diagnosis not present

## 2021-11-24 DIAGNOSIS — H5213 Myopia, bilateral: Secondary | ICD-10-CM | POA: Diagnosis not present

## 2021-11-26 ENCOUNTER — Ambulatory Visit (INDEPENDENT_AMBULATORY_CARE_PROVIDER_SITE_OTHER): Payer: Medicare Other

## 2021-11-26 DIAGNOSIS — J309 Allergic rhinitis, unspecified: Secondary | ICD-10-CM | POA: Diagnosis not present

## 2021-12-02 DIAGNOSIS — H905 Unspecified sensorineural hearing loss: Secondary | ICD-10-CM | POA: Diagnosis not present

## 2021-12-03 ENCOUNTER — Telehealth: Payer: Self-pay | Admitting: Hematology

## 2021-12-03 ENCOUNTER — Ambulatory Visit (INDEPENDENT_AMBULATORY_CARE_PROVIDER_SITE_OTHER): Payer: Medicare Other

## 2021-12-03 DIAGNOSIS — J309 Allergic rhinitis, unspecified: Secondary | ICD-10-CM

## 2021-12-03 NOTE — Telephone Encounter (Signed)
Scheduled follow-up appointments per 9/13 los. Patient is aware.

## 2021-12-09 ENCOUNTER — Inpatient Hospital Stay: Payer: Medicare Other | Attending: Nurse Practitioner

## 2021-12-09 VITALS — BP 117/63 | HR 88 | Temp 98.1°F | Resp 16

## 2021-12-09 DIAGNOSIS — Z79899 Other long term (current) drug therapy: Secondary | ICD-10-CM | POA: Diagnosis not present

## 2021-12-09 DIAGNOSIS — E538 Deficiency of other specified B group vitamins: Secondary | ICD-10-CM | POA: Insufficient documentation

## 2021-12-09 DIAGNOSIS — D509 Iron deficiency anemia, unspecified: Secondary | ICD-10-CM | POA: Diagnosis not present

## 2021-12-09 DIAGNOSIS — D508 Other iron deficiency anemias: Secondary | ICD-10-CM

## 2021-12-09 MED ORDER — SODIUM CHLORIDE 0.9 % IV SOLN
510.0000 mg | Freq: Once | INTRAVENOUS | Status: AC
  Administered 2021-12-09: 510 mg via INTRAVENOUS
  Filled 2021-12-09: qty 510

## 2021-12-09 MED ORDER — SODIUM CHLORIDE 0.9 % IV SOLN: INTRAVENOUS | Status: DC

## 2021-12-09 NOTE — Patient Instructions (Signed)

## 2021-12-09 NOTE — Progress Notes (Signed)
Patient monitored for 30 minutes post feraheme infusion. Patient tolerated tx well, VSS, and ambulatory at discharge.

## 2021-12-10 ENCOUNTER — Ambulatory Visit (INDEPENDENT_AMBULATORY_CARE_PROVIDER_SITE_OTHER): Payer: Medicare Other

## 2021-12-10 DIAGNOSIS — J309 Allergic rhinitis, unspecified: Secondary | ICD-10-CM | POA: Diagnosis not present

## 2021-12-14 ENCOUNTER — Other Ambulatory Visit: Payer: Self-pay | Admitting: Sports Medicine

## 2021-12-14 DIAGNOSIS — Z1231 Encounter for screening mammogram for malignant neoplasm of breast: Secondary | ICD-10-CM

## 2021-12-16 ENCOUNTER — Inpatient Hospital Stay: Payer: Medicare Other

## 2021-12-16 ENCOUNTER — Other Ambulatory Visit: Payer: Self-pay

## 2021-12-16 DIAGNOSIS — Z79899 Other long term (current) drug therapy: Secondary | ICD-10-CM | POA: Diagnosis not present

## 2021-12-16 DIAGNOSIS — E538 Deficiency of other specified B group vitamins: Secondary | ICD-10-CM | POA: Diagnosis not present

## 2021-12-16 DIAGNOSIS — D508 Other iron deficiency anemias: Secondary | ICD-10-CM

## 2021-12-16 DIAGNOSIS — D509 Iron deficiency anemia, unspecified: Secondary | ICD-10-CM | POA: Diagnosis not present

## 2021-12-16 MED ORDER — CYANOCOBALAMIN 1000 MCG/ML IJ SOLN
1000.0000 ug | Freq: Once | INTRAMUSCULAR | Status: AC
  Administered 2021-12-16: 1000 ug via INTRAMUSCULAR
  Filled 2021-12-16: qty 1

## 2021-12-16 NOTE — Patient Instructions (Signed)
Vitamin B12 Deficiency Vitamin B12 deficiency occurs when the body does not have enough of this important vitamin. The body needs this vitamin: To make red blood cells. To make DNA. This is the genetic material inside cells. To help the nerves work properly so they can carry messages from the brain to the body. Vitamin B12 deficiency can cause health problems, such as not having enough red blood cells in the blood (anemia). This can lead to nerve damage if untreated. What are the causes? This condition may be caused by: Not eating enough foods that contain vitamin B12. Not having enough stomach acid and digestive fluids to properly absorb vitamin B12 from the food that you eat. Having certain diseases that make it hard to absorb vitamin B12. These diseases include Crohn's disease, chronic pancreatitis, and cystic fibrosis. An autoimmune disorder in which the body does not make enough of a protein (intrinsic factor) within the stomach, resulting in not enough absorption of vitamin B12. Having a surgery in which part of the stomach or small intestine is removed. Taking certain medicines that make it hard for the body to absorb vitamin B12. These include: Heartburn medicines, such as antacids and proton pump inhibitors. Some medicines that are used to treat diabetes. What increases the risk? The following factors may make you more likely to develop a vitamin B12 deficiency: Being an older adult. Eating a vegetarian or vegan diet that does not include any foods that come from animals. Eating a poor diet while you are pregnant. Taking certain medicines. Having alcoholism. What are the signs or symptoms? In some cases, there are no symptoms of this condition. If the condition leads to anemia or nerve damage, various symptoms may occur, such as: Weakness. Tiredness (fatigue). Loss of appetite. Numbness or tingling in your hands and feet. Redness and burning of the tongue. Depression,  confusion, or memory problems. Trouble walking. If anemia is severe, symptoms can include: Shortness of breath. Dizziness. Rapid heart rate. How is this diagnosed? This condition may be diagnosed with a blood test to measure the level of vitamin B12 in your blood. You may also have other tests, including: A group of tests that measure certain characteristics of blood cells (complete blood count, CBC). A blood test to measure intrinsic factor. A procedure where a thin tube with a camera on the end is used to look into your stomach or intestines (endoscopy). Other tests may be needed to discover the cause of the deficiency. How is this treated? Treatment for this condition depends on the cause. This condition may be treated by: Changing your eating and drinking habits, such as: Eating more foods that contain vitamin B12. Drinking less alcohol or no alcohol. Getting vitamin B12 injections. Taking vitamin B12 supplements by mouth (orally). Your health care provider will tell you which dose is best for you. Follow these instructions at home: Eating and drinking  Include foods in your diet that come from animals and contain a lot of vitamin B12. These include: Meats and poultry. This includes beef, pork, chicken, turkey, and organ meats, such as liver. Seafood. This includes clams, rainbow trout, salmon, tuna, and haddock. Eggs. Dairy foods such as milk, yogurt, and cheese. Eat foods that have vitamin B12 added to them (are fortified), such as ready-to-eat breakfast cereals. Check the label on the package to see if a food is fortified. The items listed above may not be a complete list of foods and beverages you can eat and drink. Contact a dietitian for   more information. Alcohol use Do not drink alcohol if: Your health care provider tells you not to drink. You are pregnant, may be pregnant, or are planning to become pregnant. If you drink alcohol: Limit how much you have to: 0-1 drink a  day for women. 0-2 drinks a day for men. Know how much alcohol is in your drink. In the [place] one drink equals one 12 oz bottle of beer (355 mL), one 5 oz glass of wine (148 mL), or one 1 oz glass of hard liquor (44 mL). General instructions Get vitamin B12 injections if told to by your health care provider. Take supplements only as told by your health care provider. Follow the directions carefully. Keep all follow-up visits. This is important. Contact a health care provider if: Your symptoms come back. Your symptoms get worse or do not improve with treatment. Get help right away: You develop shortness of breath. You have a rapid heart rate. You have chest pain. You become dizzy or you faint. These symptoms may be an emergency. Get help right away. Call 911. Do not wait to see if the symptoms will go away. Do not drive yourself to the hospital. Summary Vitamin B12 deficiency occurs when the body does not have enough of this important vitamin. Common causes include not eating enough foods that contain vitamin B12, not being able to absorb vitamin B12 from the food that you eat, having a surgery in which part of the stomach or small intestine is removed, or taking certain medicines. Eat foods that have vitamin B12 in them. Treatment may include making a change in the way you eat and drink, getting vitamin B12 injections, or taking vitamin B12 supplements. This information is not intended to replace advice given to you by your health care provider. Make sure you discuss any questions you have with your health care provider. Document Revised: 10/17/2020 Document Reviewed: 10/17/2020 Elsevier Patient Education  2023 [employer] Inc[employer]  

## 2021-12-17 ENCOUNTER — Ambulatory Visit (INDEPENDENT_AMBULATORY_CARE_PROVIDER_SITE_OTHER): Payer: Medicare Other

## 2021-12-17 DIAGNOSIS — J309 Allergic rhinitis, unspecified: Secondary | ICD-10-CM

## 2021-12-24 ENCOUNTER — Ambulatory Visit (INDEPENDENT_AMBULATORY_CARE_PROVIDER_SITE_OTHER): Payer: Medicare Other | Admitting: *Deleted

## 2021-12-24 DIAGNOSIS — J309 Allergic rhinitis, unspecified: Secondary | ICD-10-CM | POA: Diagnosis not present

## 2021-12-24 DIAGNOSIS — H52223 Regular astigmatism, bilateral: Secondary | ICD-10-CM | POA: Diagnosis not present

## 2021-12-31 ENCOUNTER — Ambulatory Visit (INDEPENDENT_AMBULATORY_CARE_PROVIDER_SITE_OTHER): Payer: Medicare Other

## 2021-12-31 DIAGNOSIS — J309 Allergic rhinitis, unspecified: Secondary | ICD-10-CM

## 2022-01-04 ENCOUNTER — Other Ambulatory Visit (HOSPITAL_COMMUNITY): Payer: Self-pay | Admitting: Psychiatry

## 2022-01-04 DIAGNOSIS — F331 Major depressive disorder, recurrent, moderate: Secondary | ICD-10-CM

## 2022-01-04 DIAGNOSIS — F4310 Post-traumatic stress disorder, unspecified: Secondary | ICD-10-CM

## 2022-01-07 ENCOUNTER — Ambulatory Visit (INDEPENDENT_AMBULATORY_CARE_PROVIDER_SITE_OTHER): Payer: Medicare Other

## 2022-01-07 DIAGNOSIS — J309 Allergic rhinitis, unspecified: Secondary | ICD-10-CM

## 2022-01-13 ENCOUNTER — Ambulatory Visit (INDEPENDENT_AMBULATORY_CARE_PROVIDER_SITE_OTHER): Payer: Medicare Other | Admitting: *Deleted

## 2022-01-13 ENCOUNTER — Inpatient Hospital Stay: Payer: Medicare Other

## 2022-01-13 DIAGNOSIS — J309 Allergic rhinitis, unspecified: Secondary | ICD-10-CM | POA: Diagnosis not present

## 2022-01-14 ENCOUNTER — Ambulatory Visit
Admission: RE | Admit: 2022-01-14 | Discharge: 2022-01-14 | Disposition: A | Discharge status: Home / Self Care | Payer: Medicare Other | Source: Ambulatory Visit

## 2022-01-14 DIAGNOSIS — Z1231 Encounter for screening mammogram for malignant neoplasm of breast: Secondary | ICD-10-CM

## 2022-01-20 ENCOUNTER — Inpatient Hospital Stay: Payer: Medicare Other | Attending: Nurse Practitioner

## 2022-01-20 ENCOUNTER — Other Ambulatory Visit: Payer: Self-pay

## 2022-01-20 DIAGNOSIS — D508 Other iron deficiency anemias: Secondary | ICD-10-CM

## 2022-01-20 DIAGNOSIS — E538 Deficiency of other specified B group vitamins: Secondary | ICD-10-CM | POA: Insufficient documentation

## 2022-01-20 DIAGNOSIS — Z79899 Other long term (current) drug therapy: Secondary | ICD-10-CM | POA: Insufficient documentation

## 2022-01-20 MED ORDER — CYANOCOBALAMIN 1000 MCG/ML IJ SOLN
1000.0000 ug | INTRAMUSCULAR | Status: DC
  Administered 2022-01-20: 1000 ug via INTRAMUSCULAR
  Filled 2022-01-20: qty 1

## 2022-01-20 NOTE — Patient Instructions (Signed)
Vitamin B12 Deficiency Vitamin B12 deficiency occurs when the body does not have enough of this important vitamin. The body needs this vitamin: To make red blood cells. To make DNA. This is the genetic material inside cells. To help the nerves work properly so they can carry messages from the brain to the body. Vitamin B12 deficiency can cause health problems, such as not having enough red blood cells in the blood (anemia). This can lead to nerve damage if untreated. What are the causes? This condition may be caused by: Not eating enough foods that contain vitamin B12. Not having enough stomach acid and digestive fluids to properly absorb vitamin B12 from the food that you eat. Having certain diseases that make it hard to absorb vitamin B12. These diseases include Crohn's disease, chronic pancreatitis, and cystic fibrosis. An autoimmune disorder in which the body does not make enough of a protein (intrinsic factor) within the stomach, resulting in not enough absorption of vitamin B12. Having a surgery in which part of the stomach or small intestine is removed. Taking certain medicines that make it hard for the body to absorb vitamin B12. These include: Heartburn medicines, such as antacids and proton pump inhibitors. Some medicines that are used to treat diabetes. What increases the risk? The following factors may make you more likely to develop a vitamin B12 deficiency: Being an older adult. Eating a vegetarian or vegan diet that does not include any foods that come from animals. Eating a poor diet while you are pregnant. Taking certain medicines. Having alcoholism. What are the signs or symptoms? In some cases, there are no symptoms of this condition. If the condition leads to anemia or nerve damage, various symptoms may occur, such as: Weakness. Tiredness (fatigue). Loss of appetite. Numbness or tingling in your hands and feet. Redness and burning of the tongue. Depression,  confusion, or memory problems. Trouble walking. If anemia is severe, symptoms can include: Shortness of breath. Dizziness. Rapid heart rate. How is this diagnosed? This condition may be diagnosed with a blood test to measure the level of vitamin B12 in your blood. You may also have other tests, including: A group of tests that measure certain characteristics of blood cells (complete blood count, CBC). A blood test to measure intrinsic factor. A procedure where a thin tube with a camera on the end is used to look into your stomach or intestines (endoscopy). Other tests may be needed to discover the cause of the deficiency. How is this treated? Treatment for this condition depends on the cause. This condition may be treated by: Changing your eating and drinking habits, such as: Eating more foods that contain vitamin B12. Drinking less alcohol or no alcohol. Getting vitamin B12 injections. Taking vitamin B12 supplements by mouth (orally). Your health care provider will tell you which dose is best for you. Follow these instructions at home: Eating and drinking  Include foods in your diet that come from animals and contain a lot of vitamin B12. These include: Meats and poultry. This includes beef, pork, chicken, turkey, and organ meats, such as liver. Seafood. This includes clams, rainbow trout, salmon, tuna, and haddock. Eggs. Dairy foods such as milk, yogurt, and cheese. Eat foods that have vitamin B12 added to them (are fortified), such as ready-to-eat breakfast cereals. Check the label on the package to see if a food is fortified. The items listed above may not be a complete list of foods and beverages you can eat and drink. Contact a dietitian for   more information. Alcohol use Do not drink alcohol if: Your health care provider tells you not to drink. You are pregnant, may be pregnant, or are planning to become pregnant. If you drink alcohol: Limit how much you have to: 0-1 drink a  day for women. 0-2 drinks a day for men. Know how much alcohol is in your drink. In the [place] one drink equals one 12 oz bottle of beer (355 mL), one 5 oz glass of wine (148 mL), or one 1 oz glass of hard liquor (44 mL). General instructions Get vitamin B12 injections if told to by your health care provider. Take supplements only as told by your health care provider. Follow the directions carefully. Keep all follow-up visits. This is important. Contact a health care provider if: Your symptoms come back. Your symptoms get worse or do not improve with treatment. Get help right away: You develop shortness of breath. You have a rapid heart rate. You have chest pain. You become dizzy or you faint. These symptoms may be an emergency. Get help right away. Call 911. Do not wait to see if the symptoms will go away. Do not drive yourself to the hospital. Summary Vitamin B12 deficiency occurs when the body does not have enough of this important vitamin. Common causes include not eating enough foods that contain vitamin B12, not being able to absorb vitamin B12 from the food that you eat, having a surgery in which part of the stomach or small intestine is removed, or taking certain medicines. Eat foods that have vitamin B12 in them. Treatment may include making a change in the way you eat and drink, getting vitamin B12 injections, or taking vitamin B12 supplements. This information is not intended to replace advice given to you by your health care provider. Make sure you discuss any questions you have with your health care provider. Document Revised: 10/17/2020 Document Reviewed: 10/17/2020 Elsevier Patient Education  2023 [employer] Inc[employer]  

## 2022-01-21 ENCOUNTER — Ambulatory Visit (INDEPENDENT_AMBULATORY_CARE_PROVIDER_SITE_OTHER): Payer: Medicare Other

## 2022-01-21 ENCOUNTER — Other Ambulatory Visit: Payer: Self-pay

## 2022-01-21 DIAGNOSIS — J309 Allergic rhinitis, unspecified: Secondary | ICD-10-CM

## 2022-01-22 MED ORDER — PROAIR RESPICLICK 108 (90 BASE) MCG/ACT IN AEPB: 2.0000 | INHALATION_SPRAY | RESPIRATORY_TRACT | 0 refills | Status: DC | PRN

## 2022-01-27 ENCOUNTER — Ambulatory Visit (INDEPENDENT_AMBULATORY_CARE_PROVIDER_SITE_OTHER): Payer: Medicare Other | Admitting: *Deleted

## 2022-01-27 DIAGNOSIS — J309 Allergic rhinitis, unspecified: Secondary | ICD-10-CM

## 2022-02-02 ENCOUNTER — Telehealth (HOSPITAL_BASED_OUTPATIENT_CLINIC_OR_DEPARTMENT_OTHER): Payer: Medicare Other | Admitting: Psychiatry

## 2022-02-02 ENCOUNTER — Encounter (HOSPITAL_COMMUNITY): Payer: Self-pay | Admitting: Psychiatry

## 2022-02-02 ENCOUNTER — Encounter (HOSPITAL_COMMUNITY): Payer: Self-pay | Admitting: Internal Medicine

## 2022-02-02 DIAGNOSIS — F4310 Post-traumatic stress disorder, unspecified: Secondary | ICD-10-CM

## 2022-02-02 DIAGNOSIS — F331 Major depressive disorder, recurrent, moderate: Secondary | ICD-10-CM

## 2022-02-02 MED ORDER — PAROXETINE HCL 40 MG PO TABS: 40.0000 mg | ORAL_TABLET | Freq: Every day | ORAL | 0 refills | Status: DC

## 2022-02-02 MED ORDER — TRAZODONE HCL 100 MG PO TABS: 100.0000 mg | ORAL_TABLET | Freq: Every day | ORAL | 0 refills | Status: DC
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
## 2022-02-04 ENCOUNTER — Ambulatory Visit (INDEPENDENT_AMBULATORY_CARE_PROVIDER_SITE_OTHER): Payer: Medicare Other

## 2022-02-04 DIAGNOSIS — J309 Allergic rhinitis, unspecified: Secondary | ICD-10-CM

## 2022-02-07 ENCOUNTER — Encounter (HOSPITAL_COMMUNITY): Payer: Self-pay | Admitting: Internal Medicine

## 2022-02-09 ENCOUNTER — Other Ambulatory Visit: Payer: Self-pay | Admitting: Family Medicine

## 2022-02-10 ENCOUNTER — Inpatient Hospital Stay: Payer: Medicare Other

## 2022-02-11 ENCOUNTER — Ambulatory Visit (INDEPENDENT_AMBULATORY_CARE_PROVIDER_SITE_OTHER): Payer: Medicare Other

## 2022-02-11 DIAGNOSIS — J309 Allergic rhinitis, unspecified: Secondary | ICD-10-CM

## 2022-02-15 ENCOUNTER — Inpatient Hospital Stay: Payer: Medicare Other

## 2022-02-18 ENCOUNTER — Ambulatory Visit (INDEPENDENT_AMBULATORY_CARE_PROVIDER_SITE_OTHER): Payer: Medicare Other

## 2022-02-18 DIAGNOSIS — J309 Allergic rhinitis, unspecified: Secondary | ICD-10-CM

## 2022-02-24 ENCOUNTER — Inpatient Hospital Stay: Payer: Medicare Other

## 2022-02-24 ENCOUNTER — Inpatient Hospital Stay: Payer: Medicare Other | Attending: Nurse Practitioner

## 2022-02-24 DIAGNOSIS — D508 Other iron deficiency anemias: Secondary | ICD-10-CM

## 2022-02-24 DIAGNOSIS — D500 Iron deficiency anemia secondary to blood loss (chronic): Secondary | ICD-10-CM | POA: Diagnosis not present

## 2022-02-24 DIAGNOSIS — E538 Deficiency of other specified B group vitamins: Secondary | ICD-10-CM | POA: Insufficient documentation

## 2022-02-24 LAB — CBC WITH DIFFERENTIAL (CANCER CENTER ONLY)
Abs Immature Granulocytes: 0.01 10*3/uL (ref 0.00–0.07)
Basophils Absolute: 0 10*3/uL (ref 0.0–0.1)
Basophils Relative: 1 %
Eosinophils Absolute: 0.1 10*3/uL (ref 0.0–0.5)
Eosinophils Relative: 2 %
HCT: 41.9 % (ref 36.0–46.0)
Hemoglobin: 14.3 g/dL (ref 12.0–15.0)
Immature Granulocytes: 0 %
Lymphocytes Relative: 54 %
Lymphs Abs: 2.9 10*3/uL (ref 0.7–4.0)
MCH: 29.4 pg (ref 26.0–34.0)
MCHC: 34.1 g/dL (ref 30.0–36.0)
MCV: 86 fL (ref 80.0–100.0)
Monocytes Absolute: 0.3 10*3/uL (ref 0.1–1.0)
Monocytes Relative: 6 %
Neutro Abs: 2 10*3/uL (ref 1.7–7.7)
Neutrophils Relative %: 37 %
Platelet Count: 276 10*3/uL (ref 150–400)
RBC: 4.87 MIL/uL (ref 3.87–5.11)
RDW: 14.3 % (ref 11.5–15.5)
WBC Count: 5.4 10*3/uL (ref 4.0–10.5)
nRBC: 0 % (ref 0.0–0.2)

## 2022-02-24 LAB — IRON AND IRON BINDING CAPACITY (CC-WL,HP ONLY)
Iron: 71 ug/dL (ref 28–170)
Saturation Ratios: 20 % (ref 10.4–31.8)
TIBC: 353 ug/dL (ref 250–450)
UIBC: 282 ug/dL (ref 148–442)

## 2022-02-24 LAB — VITAMIN B12: Vitamin B-12: 967 pg/mL — ABNORMAL HIGH (ref 180–914)

## 2022-02-24 LAB — FERRITIN: Ferritin: 93 ng/mL (ref 11–307)

## 2022-02-24 MED ORDER — CYANOCOBALAMIN 1000 MCG/ML IJ SOLN
1000.0000 ug | INTRAMUSCULAR | Status: DC
  Administered 2022-02-24: 1000 ug via INTRAMUSCULAR
  Filled 2022-02-24: qty 1

## 2022-02-25 ENCOUNTER — Ambulatory Visit (INDEPENDENT_AMBULATORY_CARE_PROVIDER_SITE_OTHER): Payer: Medicare Other

## 2022-02-25 DIAGNOSIS — J309 Allergic rhinitis, unspecified: Secondary | ICD-10-CM

## 2022-03-03 ENCOUNTER — Encounter: Payer: Self-pay | Admitting: Hematology

## 2022-03-04 ENCOUNTER — Ambulatory Visit (INDEPENDENT_AMBULATORY_CARE_PROVIDER_SITE_OTHER): Payer: Medicare Other

## 2022-03-04 DIAGNOSIS — J309 Allergic rhinitis, unspecified: Secondary | ICD-10-CM | POA: Diagnosis not present

## 2022-03-05 ENCOUNTER — Telehealth: Payer: Self-pay

## 2022-03-05 ENCOUNTER — Other Ambulatory Visit (HOSPITAL_COMMUNITY): Payer: Self-pay

## 2022-03-05 ENCOUNTER — Encounter (HOSPITAL_COMMUNITY): Payer: Self-pay | Admitting: Internal Medicine

## 2022-03-05 NOTE — Telephone Encounter (Signed)
Pharmacy Patient Advocate Encounter   Received notification from CoverMyMeds that prior authorization for Aimovig '70MG'$ /ML auto-injectors is required/requested.    PA submitted on 03/05/22 to (ins) [employer] via CoverMyMeds Key [id number] Status is pending

## 2022-03-10 ENCOUNTER — Inpatient Hospital Stay: Payer: 59

## 2022-03-10 ENCOUNTER — Other Ambulatory Visit (HOSPITAL_COMMUNITY): Payer: Self-pay

## 2022-03-10 ENCOUNTER — Telehealth: Payer: Self-pay | Admitting: Hematology

## 2022-03-10 NOTE — Telephone Encounter (Signed)
Called patient per 1/3 in basket. Patient wanted to r/s appointments. R/s and notified patient.

## 2022-03-10 NOTE — Telephone Encounter (Signed)
Pharmacy Patient Advocate Encounter  Prior Authorization for Aimovig 140 MG has been approved.    PA# [id number] Effective dates: 03/05/2022 through 03/08/2023  This can be filled at [facility]  The PA was ran for 70 MG but was approved for the 140 MG dose when I ran a test billing.

## 2022-03-17 ENCOUNTER — Other Ambulatory Visit: Payer: Self-pay

## 2022-03-17 ENCOUNTER — Inpatient Hospital Stay: Payer: 59 | Attending: Nurse Practitioner

## 2022-03-17 DIAGNOSIS — E538 Deficiency of other specified B group vitamins: Secondary | ICD-10-CM | POA: Diagnosis not present

## 2022-03-17 DIAGNOSIS — D508 Other iron deficiency anemias: Secondary | ICD-10-CM

## 2022-03-17 MED ORDER — CYANOCOBALAMIN 1000 MCG/ML IJ SOLN
1000.0000 ug | INTRAMUSCULAR | Status: DC
  Administered 2022-03-17: 1000 ug via INTRAMUSCULAR
  Filled 2022-03-17: qty 1

## 2022-03-18 ENCOUNTER — Ambulatory Visit (INDEPENDENT_AMBULATORY_CARE_PROVIDER_SITE_OTHER): Payer: 59

## 2022-03-18 DIAGNOSIS — J309 Allergic rhinitis, unspecified: Secondary | ICD-10-CM

## 2022-03-25 ENCOUNTER — Ambulatory Visit (INDEPENDENT_AMBULATORY_CARE_PROVIDER_SITE_OTHER): Payer: 59

## 2022-03-25 DIAGNOSIS — J309 Allergic rhinitis, unspecified: Secondary | ICD-10-CM | POA: Diagnosis not present

## 2022-04-01 ENCOUNTER — Ambulatory Visit (INDEPENDENT_AMBULATORY_CARE_PROVIDER_SITE_OTHER): Payer: 59

## 2022-04-01 DIAGNOSIS — J309 Allergic rhinitis, unspecified: Secondary | ICD-10-CM | POA: Diagnosis not present

## 2022-04-05 ENCOUNTER — Other Ambulatory Visit (HOSPITAL_COMMUNITY): Payer: Self-pay | Admitting: Psychiatry

## 2022-04-05 DIAGNOSIS — F331 Major depressive disorder, recurrent, moderate: Secondary | ICD-10-CM

## 2022-04-05 DIAGNOSIS — F4310 Post-traumatic stress disorder, unspecified: Secondary | ICD-10-CM

## 2022-04-07 ENCOUNTER — Encounter (HOSPITAL_COMMUNITY): Payer: Self-pay | Admitting: Internal Medicine

## 2022-04-07 ENCOUNTER — Inpatient Hospital Stay: Payer: 59

## 2022-04-08 ENCOUNTER — Ambulatory Visit (INDEPENDENT_AMBULATORY_CARE_PROVIDER_SITE_OTHER): Payer: 59

## 2022-04-08 ENCOUNTER — Encounter (HOSPITAL_COMMUNITY): Payer: Self-pay | Admitting: Internal Medicine

## 2022-04-08 DIAGNOSIS — J309 Allergic rhinitis, unspecified: Secondary | ICD-10-CM | POA: Diagnosis not present

## 2022-04-12 DIAGNOSIS — Z79899 Other long term (current) drug therapy: Secondary | ICD-10-CM | POA: Diagnosis not present

## 2022-04-12 DIAGNOSIS — M329 Systemic lupus erythematosus, unspecified: Secondary | ICD-10-CM | POA: Diagnosis not present

## 2022-04-12 DIAGNOSIS — M2550 Pain in unspecified joint: Secondary | ICD-10-CM | POA: Diagnosis not present

## 2022-04-14 ENCOUNTER — Inpatient Hospital Stay: Payer: 59 | Attending: Nurse Practitioner

## 2022-04-14 ENCOUNTER — Other Ambulatory Visit: Payer: Self-pay

## 2022-04-14 DIAGNOSIS — D508 Other iron deficiency anemias: Secondary | ICD-10-CM

## 2022-04-14 DIAGNOSIS — Z79899 Other long term (current) drug therapy: Secondary | ICD-10-CM | POA: Insufficient documentation

## 2022-04-14 DIAGNOSIS — E538 Deficiency of other specified B group vitamins: Secondary | ICD-10-CM | POA: Insufficient documentation

## 2022-04-14 MED ORDER — CYANOCOBALAMIN 1000 MCG/ML IJ SOLN
1000.0000 ug | INTRAMUSCULAR | Status: DC
  Administered 2022-04-14: 1000 ug via INTRAMUSCULAR
  Filled 2022-04-14: qty 1

## 2022-04-15 ENCOUNTER — Ambulatory Visit (INDEPENDENT_AMBULATORY_CARE_PROVIDER_SITE_OTHER): Payer: 59

## 2022-04-15 DIAGNOSIS — J309 Allergic rhinitis, unspecified: Secondary | ICD-10-CM

## 2022-04-21 ENCOUNTER — Encounter (HOSPITAL_COMMUNITY): Payer: Self-pay | Admitting: Internal Medicine

## 2022-04-22 ENCOUNTER — Ambulatory Visit (INDEPENDENT_AMBULATORY_CARE_PROVIDER_SITE_OTHER): Payer: 59

## 2022-04-22 DIAGNOSIS — J309 Allergic rhinitis, unspecified: Secondary | ICD-10-CM | POA: Diagnosis not present

## 2022-04-26 ENCOUNTER — Telehealth (HOSPITAL_COMMUNITY): Payer: Medicare Other | Admitting: Psychiatry

## 2022-04-28 ENCOUNTER — Telehealth (HOSPITAL_BASED_OUTPATIENT_CLINIC_OR_DEPARTMENT_OTHER): Payer: 59 | Admitting: Psychiatry

## 2022-04-28 ENCOUNTER — Encounter (HOSPITAL_COMMUNITY): Payer: Self-pay | Admitting: Psychiatry

## 2022-04-28 ENCOUNTER — Encounter (HOSPITAL_COMMUNITY): Payer: Self-pay | Admitting: Internal Medicine

## 2022-04-28 VITALS — Wt 209.0 lb

## 2022-04-28 DIAGNOSIS — F4310 Post-traumatic stress disorder, unspecified: Secondary | ICD-10-CM

## 2022-04-28 DIAGNOSIS — F331 Major depressive disorder, recurrent, moderate: Secondary | ICD-10-CM

## 2022-04-28 MED ORDER — TRAZODONE HCL 100 MG PO TABS: 100.0000 mg | ORAL_TABLET | Freq: Every day | ORAL | 0 refills | Status: DC

## 2022-04-28 MED ORDER — DULOXETINE HCL 30 MG PO CPEP: ORAL_CAPSULE | ORAL | 0 refills | Status: DC

## 2022-04-28 MED ORDER — PAROXETINE HCL 20 MG PO TABS: ORAL_TABLET | ORAL | 0 refills | Status: DC
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
## 2022-04-29 ENCOUNTER — Ambulatory Visit (INDEPENDENT_AMBULATORY_CARE_PROVIDER_SITE_OTHER): Payer: 59

## 2022-04-29 DIAGNOSIS — J309 Allergic rhinitis, unspecified: Secondary | ICD-10-CM | POA: Diagnosis not present

## 2022-05-03 ENCOUNTER — Telehealth (HOSPITAL_COMMUNITY): Payer: Medicare Other | Admitting: Psychiatry

## 2022-05-03 NOTE — Progress Notes (Signed)
    SUBJECTIVE:   CHIEF COMPLAINT / HPI:   H/o Prediabetes- A1c 5.4 today. She had labs with her specialist in February noting blood glucose 143 and was told she should see her PCP to be checked for diabetes.   HLD- she would also like her cholesterol checked. She does not take any medications for cholesterol.  PERTINENT  PMH / PSH: SLE, obesity, iron def anemia, h/o PE, antiphospholipid syndrome, GERD, asthma, migraine  OBJECTIVE:   BP 126/68   Pulse 72   Wt 206 lb 12.8 oz (93.8 kg)   SpO2 98%   BMI 43.22 kg/m   General: alert & oriented, no apparent distress, well groomed HEENT: normocephalic, atraumatic, EOM grossly intact, oral mucosa moist, neck supple Respiratory: normal respiratory effort GI: non-distended Skin: no rashes, no jaundice Psych: appropriate mood and affect   ASSESSMENT/PLAN:   Hyperlipidemia Checking lipid panel today, LDL 129 in 2023. Not on home medications  Prediabetes A1c 5.4, within normal limits today Congratulated and reassured patient     Lenoria Chime, MD [place]

## 2022-05-05 ENCOUNTER — Inpatient Hospital Stay: Payer: 59

## 2022-05-05 ENCOUNTER — Inpatient Hospital Stay: Payer: 59 | Admitting: Hematology

## 2022-05-06 ENCOUNTER — Ambulatory Visit (INDEPENDENT_AMBULATORY_CARE_PROVIDER_SITE_OTHER): Payer: 59

## 2022-05-06 DIAGNOSIS — J309 Allergic rhinitis, unspecified: Secondary | ICD-10-CM

## 2022-05-07 ENCOUNTER — Ambulatory Visit (INDEPENDENT_AMBULATORY_CARE_PROVIDER_SITE_OTHER): Payer: 59 | Admitting: Family Medicine

## 2022-05-07 ENCOUNTER — Encounter (HOSPITAL_COMMUNITY): Payer: Self-pay | Admitting: Internal Medicine

## 2022-05-07 ENCOUNTER — Encounter: Payer: Self-pay | Admitting: Family Medicine

## 2022-05-07 VITALS — BP 126/68 | HR 72 | Wt 206.8 lb

## 2022-05-07 DIAGNOSIS — R7303 Prediabetes: Secondary | ICD-10-CM | POA: Diagnosis not present

## 2022-05-07 DIAGNOSIS — E785 Hyperlipidemia, unspecified: Secondary | ICD-10-CM | POA: Diagnosis not present

## 2022-05-07 LAB — POCT GLYCOSYLATED HEMOGLOBIN (HGB A1C): Hemoglobin A1C: 5.4 % (ref 4.0–5.6)

## 2022-05-07 MED ORDER — OMEPRAZOLE 40 MG PO CPDR: 40.0000 mg | DELAYED_RELEASE_CAPSULE | Freq: Every day | ORAL | 3 refills | Status: DC

## 2022-05-07 NOTE — Assessment & Plan Note (Signed)
Checking lipid panel today, LDL 129 in 2023. Not on home medications

## 2022-05-07 NOTE — Assessment & Plan Note (Signed)
A1c 5.4, within normal limits today Congratulated and reassured patient

## 2022-05-07 NOTE — Patient Instructions (Addendum)
It was wonderful to see you today.  Please bring ALL of your medications with you to every visit.   Today we talked about:  Your diabetes test, the hemoglobin A1, was 5.4 which is NORMAL. This means you do not have diabetes which is good news.  The plan for your mood medications from the psychiatrist is below:   - start Cymbalta 30 mg daily for 1 week and then twice a day - reduce Paxil from 40 mg to only 20 mg for 1 week and then stop it   Thank you for choosing [facility].   Please call [phone] with any questions about today's appointment.  Please arrive at least 15 minutes prior to your scheduled appointments.   If you had blood work today, I will send you a MyChart message or a letter if results are normal. Otherwise, I will give you a call.   If you had a referral placed, they will call you to set up an appointment. Please give [place] a call if you don't hear back in the next 2 weeks.   If you need additional refills before your next appointment, please call your pharmacy first.   Yehuda Savannah, MD  Family Medicine

## 2022-05-08 LAB — LIPID PANEL
Chol/HDL Ratio: 3.9 ratio (ref 0.0–4.4)
Cholesterol, Total: 183 mg/dL (ref 100–199)
HDL: 47 mg/dL (ref >39)
LDL Chol Calc (NIH): 116 mg/dL — ABNORMAL HIGH (ref 0–99)
Triglycerides: 112 mg/dL (ref 0–149)
VLDL Cholesterol Cal: 20 mg/dL (ref 5–40)

## 2022-05-10 ENCOUNTER — Telehealth: Payer: Self-pay | Admitting: Family Medicine

## 2022-05-10 DIAGNOSIS — E7800 Pure hypercholesterolemia, unspecified: Secondary | ICD-10-CM

## 2022-05-10 MED ORDER — ATORVASTATIN CALCIUM 40 MG PO TABS: 40.0000 mg | ORAL_TABLET | Freq: Every day | ORAL | 3 refills | Status: DC

## 2022-05-10 NOTE — Telephone Encounter (Signed)
Called patient and discussed elevated LDL. With her history of SLE and antiphospholipid syndrome, I discussed we could start a statin to reduce her LDL and risk of future strokes, and she was agreeable.  Started atorvastatin '40mg'$  daily, discussed risk of muscle aches.  Answered all questions and concerns.  Yehuda Savannah MD

## 2022-05-11 ENCOUNTER — Other Ambulatory Visit: Payer: Self-pay

## 2022-05-11 DIAGNOSIS — E538 Deficiency of other specified B group vitamins: Secondary | ICD-10-CM | POA: Insufficient documentation

## 2022-05-11 DIAGNOSIS — D508 Other iron deficiency anemias: Secondary | ICD-10-CM

## 2022-05-11 DIAGNOSIS — D500 Iron deficiency anemia secondary to blood loss (chronic): Secondary | ICD-10-CM

## 2022-05-11 NOTE — Assessment & Plan Note (Signed)
secondary to previous gastric bypass surgery -developed symptomatic deficiency in 10/2018, started monthly B12 inj. Level remains in normal range -continue monthly injections at [facility], her insurance does not cover home administration

## 2022-05-11 NOTE — Assessment & Plan Note (Signed)
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 She has required IV Feraheme since 04/2018 to keep goal ferritin >50 -she is currently taking oral iron  -she is clinically stable. Lab reviewed

## 2022-05-12 ENCOUNTER — Inpatient Hospital Stay: Payer: 59 | Attending: Nurse Practitioner

## 2022-05-12 ENCOUNTER — Inpatient Hospital Stay (HOSPITAL_BASED_OUTPATIENT_CLINIC_OR_DEPARTMENT_OTHER): Payer: 59 | Admitting: Hematology

## 2022-05-12 ENCOUNTER — Encounter: Payer: Self-pay | Admitting: Hematology

## 2022-05-12 ENCOUNTER — Other Ambulatory Visit: Payer: Self-pay

## 2022-05-12 ENCOUNTER — Inpatient Hospital Stay: Payer: 59

## 2022-05-12 VITALS — BP 122/85 | HR 82 | Temp 97.9°F | Resp 16 | Ht <= 58 in | Wt 207.4 lb

## 2022-05-12 DIAGNOSIS — Z9884 Bariatric surgery status: Secondary | ICD-10-CM | POA: Diagnosis not present

## 2022-05-12 DIAGNOSIS — N920 Excessive and frequent menstruation with regular cycle: Secondary | ICD-10-CM | POA: Insufficient documentation

## 2022-05-12 DIAGNOSIS — K909 Intestinal malabsorption, unspecified: Secondary | ICD-10-CM | POA: Diagnosis not present

## 2022-05-12 DIAGNOSIS — M329 Systemic lupus erythematosus, unspecified: Secondary | ICD-10-CM | POA: Diagnosis not present

## 2022-05-12 DIAGNOSIS — K219 Gastro-esophageal reflux disease without esophagitis: Secondary | ICD-10-CM | POA: Diagnosis not present

## 2022-05-12 DIAGNOSIS — D649 Anemia, unspecified: Secondary | ICD-10-CM | POA: Insufficient documentation

## 2022-05-12 DIAGNOSIS — E538 Deficiency of other specified B group vitamins: Secondary | ICD-10-CM | POA: Insufficient documentation

## 2022-05-12 DIAGNOSIS — G8929 Other chronic pain: Secondary | ICD-10-CM | POA: Diagnosis not present

## 2022-05-12 DIAGNOSIS — G4730 Sleep apnea, unspecified: Secondary | ICD-10-CM | POA: Diagnosis not present

## 2022-05-12 DIAGNOSIS — Z791 Long term (current) use of non-steroidal anti-inflammatories (NSAID): Secondary | ICD-10-CM | POA: Insufficient documentation

## 2022-05-12 DIAGNOSIS — D508 Other iron deficiency anemias: Secondary | ICD-10-CM

## 2022-05-12 DIAGNOSIS — Z7951 Long term (current) use of inhaled steroids: Secondary | ICD-10-CM | POA: Insufficient documentation

## 2022-05-12 DIAGNOSIS — Z79899 Other long term (current) drug therapy: Secondary | ICD-10-CM | POA: Diagnosis not present

## 2022-05-12 DIAGNOSIS — Z7901 Long term (current) use of anticoagulants: Secondary | ICD-10-CM | POA: Diagnosis not present

## 2022-05-12 DIAGNOSIS — D500 Iron deficiency anemia secondary to blood loss (chronic): Secondary | ICD-10-CM

## 2022-05-12 LAB — CBC WITH DIFFERENTIAL (CANCER CENTER ONLY)
Abs Immature Granulocytes: 0.03 10*3/uL (ref 0.00–0.07)
Basophils Absolute: 0.1 10*3/uL (ref 0.0–0.1)
Basophils Relative: 1 %
Eosinophils Absolute: 0.3 10*3/uL (ref 0.0–0.5)
Eosinophils Relative: 4 %
HCT: 43.6 % (ref 36.0–46.0)
Hemoglobin: 15.1 g/dL — ABNORMAL HIGH (ref 12.0–15.0)
Immature Granulocytes: 0 %
Lymphocytes Relative: 39 %
Lymphs Abs: 2.7 10*3/uL (ref 0.7–4.0)
MCH: 29.7 pg (ref 26.0–34.0)
MCHC: 34.6 g/dL (ref 30.0–36.0)
MCV: 85.8 fL (ref 80.0–100.0)
Monocytes Absolute: 0.4 10*3/uL (ref 0.1–1.0)
Monocytes Relative: 6 %
Neutro Abs: 3.5 10*3/uL (ref 1.7–7.7)
Neutrophils Relative %: 50 %
Platelet Count: 315 10*3/uL (ref 150–400)
RBC: 5.08 MIL/uL (ref 3.87–5.11)
RDW: 13.6 % (ref 11.5–15.5)
WBC Count: 7 10*3/uL (ref 4.0–10.5)
nRBC: 0 % (ref 0.0–0.2)

## 2022-05-12 LAB — CMP (CANCER CENTER ONLY)
ALT: 10 U/L (ref 0–44)
AST: 10 U/L — ABNORMAL LOW (ref 15–41)
Albumin: 4.3 g/dL (ref 3.5–5.0)
Alkaline Phosphatase: 62 U/L (ref 38–126)
Anion gap: 8 (ref 5–15)
BUN: 7 mg/dL (ref 6–20)
CO2: 26 mmol/L (ref 22–32)
Calcium: 9.6 mg/dL (ref 8.9–10.3)
Chloride: 101 mmol/L (ref 98–111)
Creatinine: 0.47 mg/dL (ref 0.44–1.00)
GFR, Estimated: >60 mL/min (ref >60)
Glucose, Bld: 87 mg/dL (ref 70–99)
Potassium: 4.1 mmol/L (ref 3.5–5.1)
Sodium: 135 mmol/L (ref 135–145)
Total Bilirubin: 0.4 mg/dL (ref 0.3–1.2)
Total Protein: 8 g/dL (ref 6.5–8.1)

## 2022-05-12 LAB — IRON AND IRON BINDING CAPACITY (CC-WL,HP ONLY)
Iron: 74 ug/dL (ref 28–170)
Saturation Ratios: 19 % (ref 10.4–31.8)
TIBC: 389 ug/dL (ref 250–450)
UIBC: 315 ug/dL (ref 148–442)

## 2022-05-12 LAB — FERRITIN: Ferritin: 78 ng/mL (ref 11–307)

## 2022-05-12 LAB — VITAMIN B12: Vitamin B-12: 488 pg/mL (ref 180–914)

## 2022-05-12 MED ORDER — CYANOCOBALAMIN 1000 MCG/ML IJ SOLN
1000.0000 ug | INTRAMUSCULAR | Status: DC
  Administered 2022-05-12: 1000 ug via INTRAMUSCULAR
  Filled 2022-05-12: qty 1

## 2022-05-12 NOTE — Progress Notes (Signed)
[facility]   [phone] [phone]   Clinic Follow up Note   Patient Care Team: Lenoria Chime, MD as PCP - General (Family Medicine) Buford Dresser, MD as PCP - Cardiology (Cardiology) Pieter Partridge, DO as Consulting Physician (Neurology) Truitt Merle, MD as Consulting Physician (Hematology and Oncology)  Date of Service:  05/12/2022  CHIEF COMPLAINT: f/u of anemia   CURRENT THERAPY:  -IV Feraheme 510 mg PRN goal ferritin >50, last given 06/2020 -Oral ferrous sulfate -Apixaban 5 mg BID  -B12 injection 1000 mcg monthly starting 10/2018   ASSESSMENT:  Melissa Gaines is a 52 y.o. female with   Iron deficiency anemia Due to chronic blood loss from menorrhagia and 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 She has required IV Feraheme since 04/2018 to keep goal ferritin >50 -she is currently taking oral iron.  She is perimenopausal now, may not need much iron supplement, I recommended her to decrease oral iron to once a day -she is clinically stable. Lab reviewed, Hg 15.1  B12 deficiency secondary to previous gastric bypass surgery -developed symptomatic deficiency in 10/2018, started monthly B12 inj. Level remains in normal range -continue monthly injections at [facility], her insurance does not cover home administration   PLAN: -lab reviewed -discuss B12 lab from November which was slightly high  I recommend  reduce OTC  oral Iron to 1 time daily. -Continue the B12  injections 1 month x 6 -f/u in 6 months   INTERVAL HISTORY:  Melissa Gaines is here for a follow up of anemia  She was last seen by me on 11/18/2021 She presents to the clinic alone . Pt state that everything is going fine.Pt reports that she doesn't have andy energy and she has SOB when she walk stairs. Pt state she take oral Iron 3 times a day. Pt state when she does get IV I ron and B12 shot  she feels a little better for about 4 days. Pt state her period come and go.     All other systems were reviewed with the patient and are negative.  MEDICAL HISTORY:  Past Medical History:  Diagnosis Date   Anemia    Anxiety    Asthma    Chronic pain    Congenital hip dislocation    GERD (gastroesophageal reflux disease)    Iron deficiency anemia 04/14/2018   Kidney mass 2017   Migraine    Osteoporosis    Pathological dislocation of shoulder joint, bilateral    congential   PTSD (post-traumatic stress disorder)    Sleep apnea    Systemic lupus erythematosus ([place])    Urticaria     SURGICAL HISTORY: Past Surgical History:  Procedure Laterality Date   CESAREAN SECTION  2005   CESAREAN SECTION W/BTL  2010   HIP SURGERY     in childhood for congenital hip dislocation   LAPAROSCOPIC GASTRIC SLEEVE RESECTION  2014   STAPEDECTOMY  11/01/2011   L postauricular stapedectomy with CO2 laser and insertion of 6 x 4.75 mm SMart Piston   TOTAL HIP ARTHROPLASTY     05/2000 R, 11/2000 L    I have reviewed the social history and family history with the patient and they are unchanged from previous note.  ALLERGIES:  is allergic to fish allergy.  MEDICATIONS:  Current Outpatient Medications  Medication Sig Dispense Refill   acetaminophen (TYLENOL) 500 MG tablet Take 500 mg by  mouth every 6 (six) hours as needed for headache.     albuterol (VENTOLIN HFA) 108 (90 Base) MCG/ACT inhaler Inhale 2 puffs into the lungs every 6 (six) hours as needed for wheezing or shortness of breath. 8 g 2   apixaban (ELIQUIS) 5 MG TABS tablet Take 1 tablet (5 mg total) by mouth 2 (two) times daily. 60 tablet 11   atorvastatin (LIPITOR) 40 MG tablet Take 1 tablet (40 mg total) by mouth daily. 90 tablet 3   budesonide-formoterol (SYMBICORT) 160-4.5 MCG/ACT inhaler Inhale 2 puffs into the lungs daily. 1 each 12   cetirizine (ZYRTEC) 10 MG tablet Take 1 tablet (10 mg total) by mouth daily. 30 tablet 11    cyanocobalamin (,VITAMIN B-12,) 1000 MCG/ML injection Inject 1 mL (1,000 mcg total) into the skin every 30 (thirty) days. 5 mL 0   cycloSPORINE (RESTASIS) 0.05 % ophthalmic emulsion Place 1 drop into both eyes 2 (two) times daily. 0.4 mL 0   DULoxetine (CYMBALTA) 30 MG capsule Take one capsule for one week and than twice daily 60 capsule 0   EPINEPHrine 0.3 mg/0.3 mL IJ SOAJ injection Inject 0.3 mg into the muscle as needed for anaphylaxis. 1 each 1   Erenumab-aooe (AIMOVIG) 140 MG/ML SOAJ Inject 140 mg into the skin every 30 (thirty) days. 1.12 mL 11   ferrous sulfate 325 (65 FE) MG tablet Take 1 tablet (325 mg total) by mouth 3 (three) times daily with meals. 90 tablet 1   fluticasone (FLONASE) 50 MCG/ACT nasal spray Place 2 sprays into both nostrils daily. 16 g 6   gabapentin (NEURONTIN) 100 MG capsule TAKE 3 CAPSULE EVERY NIGHT AT BEDTIME (Patient not taking: Reported on 02/02/2022) 90 capsule 3   naproxen (NAPROSYN) 500 MG tablet Take 500 mg by mouth 2 (two) times daily.     omeprazole (PRILOSEC) 40 MG capsule Take 1 capsule (40 mg total) by mouth daily. 90 capsule 3   PARoxetine (PAXIL) 20 MG tablet Take one tab for one week and than discontinued. 10 tablet 0   PROAIR RESPICLICK 123XX123 (90 Base) MCG/ACT AEPB USE 2 INHALATIONS BY MOUTH INTO  THE LUNGS EVERY 4 HOURS AS  NEEDED. 3 each 6   SYRINGE/NEEDLE, DISP, 1 ML 25G X 5/8" 1 ML MISC Match with B12 3 each 3   traZODone (DESYREL) 100 MG tablet Take 1 tablet (100 mg total) by mouth at bedtime. 90 tablet 0   Ubrogepant (UBRELVY) 100 MG TABS Take 1 tablet by mouth as needed (May repeat in 2 hours.  Maximum 2 tablets in 24 hours). 10 tablet 11   Vitamin D, Ergocalciferol, (DRISDOL) 50000 units CAPS capsule Take 1 capsule (50,000 Units total) by mouth every 7 (seven) days. 8 capsule 0   No current facility-administered medications for this visit.    PHYSICAL EXAMINATION: ECOG PERFORMANCE STATUS: 1 - Symptomatic but completely ambulatory  Vitals:    05/12/22 1010  BP: 122/85  Pulse: 82  Resp: 16  Temp: 97.9 F (36.6 C)  SpO2: 97%   Wt Readings from Last 3 Encounters:  05/12/22 207 lb 6.4 oz (94.1 kg)  05/07/22 206 lb 12.8 oz (93.8 kg)  11/18/21 208 lb 4.8 oz (94.5 kg)     GENERAL:alert, no distress and comfortable SKIN: skin color normal, no rashes or significant lesions EYES: normal, Conjunctiva are pink and non-injected, sclera clear  NEURO: alert & oriented x 3 with fluent speech   LABORATORY DATA:  I have reviewed the data as listed  Latest Ref Rng & Units 05/12/2022    9:46 AM 02/24/2022   10:12 AM 11/18/2021   10:25 AM  CBC  WBC 4.0 - 10.5 K/uL 7.0  5.4  7.4   Hemoglobin 12.0 - 15.0 g/dL 15.1  14.3  14.2   Hematocrit 36.0 - 46.0 % 43.6  41.9  42.6   Platelets 150 - 400 K/uL 315  276  353         Latest Ref Rng & Units 05/12/2022    9:46 AM 10/26/2021   10:21 AM 07/28/2021   12:44 PM  CMP  Glucose 70 - 99 mg/dL 87  87  86   BUN 6 - 20 mg/dL '7  7  6   '$ Creatinine 0.44 - 1.00 mg/dL 0.47  0.43  0.55   Sodium 135 - 145 mmol/L 135  139  139   Potassium 3.5 - 5.1 mmol/L 4.1  3.7  4.1   Chloride 98 - 111 mmol/L 101  103  106   CO2 22 - 32 mmol/L '26  24  26   '$ Calcium 8.9 - 10.3 mg/dL 9.6  9.0  9.2   Total Protein 6.5 - 8.1 g/dL 8.0   7.9   Total Bilirubin 0.3 - 1.2 mg/dL 0.4   0.4   Alkaline Phos 38 - 126 U/L 62   60   AST 15 - 41 U/L 10   9   ALT 0 - 44 U/L 10   11       RADIOGRAPHIC STUDIES: I have personally reviewed the radiological images as listed and agreed with the findings in the report. No results found.    No orders of the defined types were placed in this encounter.  All questions were answered. The patient knows to call the clinic with any problems, questions or concerns. No barriers to learning was detected. The total time spent in the appointment was 15 minutes.     Truitt Merle, MD 05/12/2022   Felicity Coyer, CMA, am acting as scribe for Truitt Merle, MD.   I have reviewed the above  documentation for accuracy and completeness, and I agree with the above.

## 2022-05-13 ENCOUNTER — Ambulatory Visit (INDEPENDENT_AMBULATORY_CARE_PROVIDER_SITE_OTHER): Payer: 59

## 2022-05-13 DIAGNOSIS — J309 Allergic rhinitis, unspecified: Secondary | ICD-10-CM

## 2022-05-20 ENCOUNTER — Ambulatory Visit (INDEPENDENT_AMBULATORY_CARE_PROVIDER_SITE_OTHER): Payer: 59

## 2022-05-20 DIAGNOSIS — J309 Allergic rhinitis, unspecified: Secondary | ICD-10-CM

## 2022-05-26 ENCOUNTER — Encounter (HOSPITAL_COMMUNITY): Payer: Self-pay | Admitting: Psychiatry

## 2022-05-26 ENCOUNTER — Telehealth (HOSPITAL_BASED_OUTPATIENT_CLINIC_OR_DEPARTMENT_OTHER): Payer: 59 | Admitting: Psychiatry

## 2022-05-26 VITALS — Wt 206.0 lb

## 2022-05-26 DIAGNOSIS — F4310 Post-traumatic stress disorder, unspecified: Secondary | ICD-10-CM

## 2022-05-26 DIAGNOSIS — F331 Major depressive disorder, recurrent, moderate: Secondary | ICD-10-CM

## 2022-05-26 MED ORDER — GABAPENTIN 100 MG PO CAPS: ORAL_CAPSULE | ORAL | 1 refills | Status: DC

## 2022-05-26 MED ORDER — DULOXETINE HCL 60 MG PO CPEP: ORAL_CAPSULE | ORAL | 1 refills | Status: DC
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
## 2022-05-27 ENCOUNTER — Ambulatory Visit (INDEPENDENT_AMBULATORY_CARE_PROVIDER_SITE_OTHER): Payer: 59

## 2022-05-27 DIAGNOSIS — J309 Allergic rhinitis, unspecified: Secondary | ICD-10-CM

## 2022-06-01 ENCOUNTER — Encounter (HOSPITAL_COMMUNITY): Payer: Self-pay | Admitting: Internal Medicine

## 2022-06-03 ENCOUNTER — Ambulatory Visit (INDEPENDENT_AMBULATORY_CARE_PROVIDER_SITE_OTHER): Payer: 59

## 2022-06-03 DIAGNOSIS — J309 Allergic rhinitis, unspecified: Secondary | ICD-10-CM

## 2022-06-07 ENCOUNTER — Telehealth: Payer: Self-pay | Admitting: Nurse Practitioner

## 2022-06-07 NOTE — Telephone Encounter (Signed)
Patient called to reschedule 4/3 appointment due to childcare issues. Patient rescheduled and notifed.

## 2022-06-08 NOTE — Progress Notes (Signed)
VIAL EXP 06-08-23

## 2022-06-09 ENCOUNTER — Inpatient Hospital Stay: Payer: 59

## 2022-06-09 DIAGNOSIS — J302 Other seasonal allergic rhinitis: Secondary | ICD-10-CM | POA: Diagnosis not present

## 2022-06-10 ENCOUNTER — Ambulatory Visit (INDEPENDENT_AMBULATORY_CARE_PROVIDER_SITE_OTHER): Payer: 59

## 2022-06-10 DIAGNOSIS — J309 Allergic rhinitis, unspecified: Secondary | ICD-10-CM

## 2022-06-11 ENCOUNTER — Telehealth: Payer: Self-pay | Admitting: Hematology

## 2022-06-11 ENCOUNTER — Inpatient Hospital Stay: Payer: 59

## 2022-06-11 NOTE — Telephone Encounter (Signed)
Reached out to patient to move injection appointment.

## 2022-06-15 ENCOUNTER — Other Ambulatory Visit (HOSPITAL_COMMUNITY): Payer: Self-pay | Admitting: Psychiatry

## 2022-06-15 DIAGNOSIS — F331 Major depressive disorder, recurrent, moderate: Secondary | ICD-10-CM

## 2022-06-15 DIAGNOSIS — F4310 Post-traumatic stress disorder, unspecified: Secondary | ICD-10-CM

## 2022-06-16 ENCOUNTER — Ambulatory Visit (INDEPENDENT_AMBULATORY_CARE_PROVIDER_SITE_OTHER): Payer: 59

## 2022-06-16 ENCOUNTER — Encounter (HOSPITAL_COMMUNITY): Payer: Self-pay | Admitting: Internal Medicine

## 2022-06-16 DIAGNOSIS — J309 Allergic rhinitis, unspecified: Secondary | ICD-10-CM

## 2022-06-16 MED ORDER — EPINEPHRINE 0.3 MG/0.3ML IJ SOAJ: 0.3000 mg | INTRAMUSCULAR | 1 refills | Status: DC | PRN

## 2022-06-16 MED ORDER — LEVOCETIRIZINE DIHYDROCHLORIDE 5 MG PO TABS: 5.0000 mg | ORAL_TABLET | Freq: Every evening | ORAL | 5 refills | Status: DC

## 2022-06-16 NOTE — Addendum Note (Signed)
Addended by: Dub Mikes on: 06/16/2022 11:47 AM   Modules accepted: Orders

## 2022-06-16 NOTE — Progress Notes (Addendum)
Patient received her allergy injections and was in the office with her son for a new patient appointment. Patient stated having itchy all over. Patient was given Zyrtec however when she went to swallow it she stated that she was having a hard time swallowing it. Patient was evaluated by our NP Chrissie. After 10 minutes of receiving Zyrtec patient stated symptoms have subsided and all she was having was itchy on her back. Patient went over with her son and an hour later stated experiencing coughing. Patient was given a nebulizer, Famotidine and Prednisolone. 30 minutes after patient stated she was doing better and was not coughing anymore. Per Dr. Selena Batten please back down to 0.3 ml of her Red and build up according to patients schedule A. At 4:28 pm follow up call was made and patient stated she was doing better but is sleepy now. She stated that she is still itching a little but not as bad. Patient was informed if she has any issues to call the office.

## 2022-06-18 ENCOUNTER — Inpatient Hospital Stay: Payer: 59 | Attending: Nurse Practitioner

## 2022-06-18 ENCOUNTER — Other Ambulatory Visit: Payer: Self-pay

## 2022-06-18 DIAGNOSIS — E538 Deficiency of other specified B group vitamins: Secondary | ICD-10-CM | POA: Diagnosis not present

## 2022-06-18 DIAGNOSIS — D508 Other iron deficiency anemias: Secondary | ICD-10-CM

## 2022-06-18 MED ORDER — CYANOCOBALAMIN 1000 MCG/ML IJ SOLN
1000.0000 ug | Freq: Once | INTRAMUSCULAR | Status: AC
  Administered 2022-06-18: 1000 ug via INTRAMUSCULAR
  Filled 2022-06-18: qty 1

## 2022-06-24 ENCOUNTER — Ambulatory Visit (INDEPENDENT_AMBULATORY_CARE_PROVIDER_SITE_OTHER): Payer: 59

## 2022-06-24 DIAGNOSIS — J309 Allergic rhinitis, unspecified: Secondary | ICD-10-CM

## 2022-06-29 ENCOUNTER — Other Ambulatory Visit (HOSPITAL_COMMUNITY): Payer: Self-pay | Admitting: Psychiatry

## 2022-06-29 DIAGNOSIS — F331 Major depressive disorder, recurrent, moderate: Secondary | ICD-10-CM

## 2022-06-29 DIAGNOSIS — F4310 Post-traumatic stress disorder, unspecified: Secondary | ICD-10-CM

## 2022-07-01 ENCOUNTER — Ambulatory Visit (INDEPENDENT_AMBULATORY_CARE_PROVIDER_SITE_OTHER): Payer: 59

## 2022-07-01 DIAGNOSIS — J309 Allergic rhinitis, unspecified: Secondary | ICD-10-CM

## 2022-07-05 ENCOUNTER — Other Ambulatory Visit (HOSPITAL_COMMUNITY): Payer: Self-pay | Admitting: Psychiatry

## 2022-07-05 DIAGNOSIS — F4310 Post-traumatic stress disorder, unspecified: Secondary | ICD-10-CM

## 2022-07-05 DIAGNOSIS — F331 Major depressive disorder, recurrent, moderate: Secondary | ICD-10-CM

## 2022-07-06 ENCOUNTER — Other Ambulatory Visit (HOSPITAL_COMMUNITY): Payer: Self-pay | Admitting: Psychiatry

## 2022-07-06 DIAGNOSIS — F4310 Post-traumatic stress disorder, unspecified: Secondary | ICD-10-CM

## 2022-07-07 ENCOUNTER — Telehealth (HOSPITAL_COMMUNITY): Payer: 59 | Admitting: Psychiatry

## 2022-07-07 ENCOUNTER — Encounter (HOSPITAL_COMMUNITY): Payer: Self-pay | Admitting: Internal Medicine

## 2022-07-12 ENCOUNTER — Telehealth (HOSPITAL_BASED_OUTPATIENT_CLINIC_OR_DEPARTMENT_OTHER): Payer: 59 | Admitting: Psychiatry

## 2022-07-12 ENCOUNTER — Encounter (HOSPITAL_COMMUNITY): Payer: Self-pay | Admitting: Psychiatry

## 2022-07-12 VITALS — Wt 209.0 lb

## 2022-07-12 DIAGNOSIS — F331 Major depressive disorder, recurrent, moderate: Secondary | ICD-10-CM | POA: Diagnosis not present

## 2022-07-12 DIAGNOSIS — F4310 Post-traumatic stress disorder, unspecified: Secondary | ICD-10-CM | POA: Diagnosis not present

## 2022-07-12 MED ORDER — GABAPENTIN 300 MG PO CAPS: 300.0000 mg | ORAL_CAPSULE | Freq: Every day | ORAL | 1 refills | Status: DC

## 2022-07-12 MED ORDER — FLUOXETINE HCL 20 MG PO CAPS
20.00 mg | ORAL_CAPSULE | Freq: Every day | ORAL | 1 refills | Status: DC
Start: 2022-07-12 — End: 2022-09-13

## 2022-07-12 MED ORDER — TRAZODONE HCL 100 MG PO TABS
100.00 mg | ORAL_TABLET | Freq: Every day | ORAL | 0 refills | Status: DC
Start: 2022-07-12 — End: 2022-09-13
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
## 2022-07-14 ENCOUNTER — Inpatient Hospital Stay: Payer: 59

## 2022-07-15 ENCOUNTER — Telehealth: Payer: Self-pay | Admitting: Family Medicine

## 2022-07-15 ENCOUNTER — Encounter (HOSPITAL_COMMUNITY): Payer: Self-pay | Admitting: Internal Medicine

## 2022-07-15 ENCOUNTER — Ambulatory Visit (INDEPENDENT_AMBULATORY_CARE_PROVIDER_SITE_OTHER): Payer: 59

## 2022-07-15 DIAGNOSIS — J309 Allergic rhinitis, unspecified: Secondary | ICD-10-CM

## 2022-07-15 NOTE — Telephone Encounter (Signed)
Contacted Melissa Gaines to schedule their annual wellness visit. Appointment made for 07/19/2022.  Thank you,  [facility] Support [facility] Direct dial  [phone]

## 2022-07-19 ENCOUNTER — Inpatient Hospital Stay: Payer: 59 | Attending: Nurse Practitioner

## 2022-07-19 ENCOUNTER — Ambulatory Visit (INDEPENDENT_AMBULATORY_CARE_PROVIDER_SITE_OTHER): Payer: 59

## 2022-07-19 ENCOUNTER — Other Ambulatory Visit: Payer: Self-pay

## 2022-07-19 VITALS — Ht <= 58 in | Wt 209.0 lb

## 2022-07-19 DIAGNOSIS — D508 Other iron deficiency anemias: Secondary | ICD-10-CM

## 2022-07-19 DIAGNOSIS — E538 Deficiency of other specified B group vitamins: Secondary | ICD-10-CM | POA: Diagnosis not present

## 2022-07-19 DIAGNOSIS — Z0000 Encounter for general adult medical examination without abnormal findings: Secondary | ICD-10-CM | POA: Diagnosis not present

## 2022-07-19 MED ORDER — CYANOCOBALAMIN 1000 MCG/ML IJ SOLN
1000.0000 ug | INTRAMUSCULAR | Status: DC
  Administered 2022-07-19: 1000 ug via INTRAMUSCULAR
  Filled 2022-07-19: qty 1

## 2022-07-19 NOTE — Patient Instructions (Signed)
[name] , Thank you for taking time to come for your Medicare Wellness Visit. I appreciate your ongoing commitment to your health goals. Please review the following plan we discussed and let me know if I can assist you in the future.   These are the goals we discussed:  Goals      Increase personal care service hours     Weight (lb) < 200 lb (90.7 kg)     would like [employer] to go to none medical places        This is a list of the screening recommended for you and due dates:  Health Maintenance  Topic Date Due   Zoster (Shingles) Vaccine (1 of 2) Never done   COVID-19 Vaccine (5 - 2023-24 season) 11/06/2021   Flu Shot  10/07/2022   Medicare Annual Wellness Visit  07/19/2023   Mammogram  01/15/2024   Pap Smear  07/01/2024   DTaP/Tdap/Td vaccine (2 - Td or Tdap) 12/17/2026   Colon Cancer Screening  04/24/2031   Hepatitis C Screening: USPSTF Recommendation to screen - Ages 18-79 yo.  Completed   HIV Screening  Completed   HPV Vaccine  Aged Out    Advanced directives: Information on Advanced Care Planning can be found at [facility] [place] Advance Health Care Directives Advance Health Care Directives ([email or web address]    Conditions/risks identified: Aim for 30 minutes of exercise or brisk walking, 6-8 glasses of water, and 5 servings of fruits and vegetables each day.   Next appointment: Follow up in one year for your annual wellness visit.   Preventive Care 40-64 Years, Female Preventive care refers to lifestyle choices and visits with your health care provider that can promote health and wellness. What does preventive care include? A yearly physical exam. This is also called an annual well check. Dental exams once or twice a year. Routine eye exams. Ask your health care provider how often you should have your eyes checked. Personal lifestyle choices, including: Daily care of your teeth and gums. Regular physical activity. Eating a healthy  diet. Avoiding tobacco and drug use. Limiting alcohol use. Practicing safe sex. Taking low-dose aspirin daily starting at age 89. Taking vitamin and mineral supplements as recommended by your health care provider. What happens during an annual well check? The services and screenings done by your health care provider during your annual well check will depend on your age, overall health, lifestyle risk factors, and family history of disease. Counseling  Your health care provider may ask you questions about your: Alcohol use. Tobacco use. Drug use. Emotional well-being. Home and relationship well-being. Sexual activity. Eating habits. Work and work Astron[employer]. Method of birth control. Menstrual cycle. Pregnancy history. Screening  You may have the following tests or measurements: Height, weight, and BMI. Blood pressure. Lipid and cholesterol levels. These may be checked every 5 years, or more frequently if you are over 25 years old. Skin check. Lung cancer screening. You may have this screening every year starting at age 9 if you have a 30-pack-year history of smoking and currently smoke or have quit within the past 15 years. Fecal occult blood test (FOBT) of the stool. You may have this test every year starting at age 48. Flexible sigmoidoscopy or colonoscopy. You may have a sigmoidoscopy every 5 years or a colonoscopy every 10 years starting at age 79. Hepatitis C blood test. Hepatitis B blood test. Sexually transmitted disease (STD) testing. Diabetes screening. This is done by checking  your blood sugar (glucose) after you have not eaten for a while (fasting). You may have this done every 1-3 years. Mammogram. This may be done every 1-2 years. Talk to your health care provider about when you should start having regular mammograms. This may depend on whether you have a family history of breast cancer. BRCA-related cancer screening. This may be done if you have a family history of  breast, ovarian, tubal, or peritoneal cancers. Pelvic exam and Pap test. This may be done every 3 years starting at age 55. Starting at age 72, this may be done every 5 years if you have a Pap test in combination with an HPV test. Bone density scan. This is done to screen for osteoporosis. You may have this scan if you are at high risk for osteoporosis. Discuss your test results, treatment options, and if necessary, the need for more tests with your health care provider. Vaccines  Your health care provider may recommend certain vaccines, such as: Influenza vaccine. This is recommended every year. Tetanus, diphtheria, and acellular pertussis (Tdap, Td) vaccine. You may need a Td booster every 10 years. Zoster vaccine. You may need this after age 66. Pneumococcal 13-valent conjugate (PCV13) vaccine. You may need this if you have certain conditions and were not previously vaccinated. Pneumococcal polysaccharide (PPSV23) vaccine. You may need one or two doses if you smoke cigarettes or if you have certain conditions. Talk to your health care provider about which screenings and vaccines you need and how often you need them. This information is not intended to replace advice given to you by your health care provider. Make sure you discuss any questions you have with your health care provider. Document Released: 03/21/2015 Document Revised: 11/12/2015 Document Reviewed: 12/24/2014 Elsevier Interactive Patient Education  2017 [place] Prevention in the Home Falls can cause injuries. They can happen to people of all ages. There are many things you can do to make your home safe and to help prevent falls. What can I do on the outside of my home? Regularly fix the edges of walkways and driveways and fix any cracks. Remove anything that might make you trip as you walk through a door, such as a raised step or threshold. Trim any bushes or trees on the path to your home. Use bright outdoor  lighting. Clear any walking paths of anything that might make someone trip, such as rocks or tools. Regularly check to see if handrails are loose or broken. Make sure that both sides of any steps have handrails. Any raised decks and porches should have guardrails on the edges. Have any leaves, snow, or ice cleared regularly. Use sand or salt on walking paths during winter. Clean up any spills in your garage right away. This includes oil or grease spills. What can I do in the bathroom? Use night lights. Install grab bars by the toilet and in the tub and shower. Do not use towel bars as grab bars. Use non-skid mats or decals in the tub or shower. If you need to sit down in the shower, use a plastic, non-slip stool. Keep the floor dry. Clean up any water that spills on the floor as soon as it happens. Remove soap buildup in the tub or shower regularly. Attach bath mats securely with double-sided non-slip rug tape. Do not have throw rugs and other things on the floor that can make you trip. What can I do in the bedroom? Use night lights. Make sure  that you have a light by your bed that is easy to reach. Do not use any sheets or blankets that are too big for your bed. They should not hang down onto the floor. Have a firm chair that has side arms. You can use this for support while you get dressed. Do not have throw rugs and other things on the floor that can make you trip. What can I do in the kitchen? Clean up any spills right away. Avoid walking on wet floors. Keep items that you use a lot in easy-to-reach places. If you need to reach something above you, use a strong step stool that has a grab bar. Keep electrical cords out of the way. Do not use floor polish or wax that makes floors slippery. If you must use wax, use non-skid floor wax. Do not have throw rugs and other things on the floor that can make you trip. What can I do with my stairs? Do not leave any items on the stairs. Make  sure that there are handrails on both sides of the stairs and use them. Fix handrails that are broken or loose. Make sure that handrails are as long as the stairways. Check any carpeting to make sure that it is firmly attached to the stairs. Fix any carpet that is loose or worn. Avoid having throw rugs at the top or bottom of the stairs. If you do have throw rugs, attach them to the floor with carpet tape. Make sure that you have a light switch at the top of the stairs and the bottom of the stairs. If you do not have them, ask someone to add them for you. What else can I do to help prevent falls? Wear shoes that: Do not have high heels. Have rubber bottoms. Are comfortable and fit you well. Are closed at the toe. Do not wear sandals. If you use a stepladder: Make sure that it is fully opened. Do not climb a closed stepladder. Make sure that both sides of the stepladder are locked into place. Ask someone to hold it for you, if possible. Clearly mark and make sure that you can see: Any grab bars or handrails. First and last steps. Where the edge of each step is. Use tools that help you move around (mobility aids) if they are needed. These include: Canes. Walkers. Scooters. Crutches. Turn on the lights when you go into a dark area. Replace any light bulbs as soon as they burn out. Set up your furniture so you have a clear path. Avoid moving your furniture around. If any of your floors are uneven, fix them. If there are any pets around you, be aware of where they are. Review your medicines with your doctor. Some medicines can make you feel dizzy. This can increase your chance of falling. Ask your doctor what other things that you can do to help prevent falls. This information is not intended to replace advice given to you by your health care provider. Make sure you discuss any questions you have with your health care provider. Document Released: 12/19/2008 Document Revised: 07/31/2015  Document Reviewed: 03/29/2014 Elsevier Interactive Patient Education  2017 [employer].

## 2022-07-19 NOTE — Progress Notes (Signed)
Subjective:   Melissa Gaines is a 52 y.o. female who presents for Medicare Annual (Subsequent) preventive examination.  I connected with  Melissa Gaines on 07/19/22 by a audio enabled telemedicine application and verified that I am speaking with the correct person using two identifiers.  Patient Location: Home  Provider Location: Home Office  I discussed the limitations of evaluation and management by telemedicine. The patient expressed understanding and agreed to proceed.  Review of Systems     Cardiac Risk Factors include: sedentary lifestyle;obesity (BMI >30kg/m2);dyslipidemia     Objective:    Today's Vitals   07/19/22 1758  Weight: 209 lb (94.8 kg)  Height: 4\' 10"  (1.473 m)   Body mass index is 43.68 kg/m.     07/19/2022    6:15 PM 05/07/2022    9:59 AM 07/31/2021    1:30 PM 07/01/2021    1:44 PM 07/22/2020   10:13 AM 02/20/2020    9:07 AM 07/16/2019   10:19 AM  Advanced Directives  Does Patient Have a Medical Advance Directive? No No No No No No Yes  Type of Tax inspector;Living will  Does patient want to make changes to medical advance directive? Yes (MAU/Ambulatory/Procedural Areas - Information given)        Would patient like information on creating a medical advance directive?  No - Patient declined No - Patient declined No - Patient declined No - Patient declined      Current Medications (verified) Outpatient Encounter Medications as of 07/19/2022  Medication Sig   acetaminophen (TYLENOL) 500 MG tablet Take 500 mg by mouth every 6 (six) hours as needed for headache.   albuterol (VENTOLIN HFA) 108 (90 Base) MCG/ACT inhaler Inhale 2 puffs into the lungs every 6 (six) hours as needed for wheezing or shortness of breath.   apixaban (ELIQUIS) 5 MG TABS tablet Take 1 tablet (5 mg total) by mouth 2 (two) times daily.   atorvastatin (LIPITOR) 40 MG tablet Take 1 tablet (40 mg total) by mouth  daily.   budesonide-formoterol (SYMBICORT) 160-4.5 MCG/ACT inhaler Inhale 2 puffs into the lungs daily.   cetirizine (ZYRTEC) 10 MG tablet Take 1 tablet (10 mg total) by mouth daily.   cyanocobalamin (,VITAMIN B-12,) 1000 MCG/ML injection Inject 1 mL (1,000 mcg total) into the skin every 30 (thirty) days.   cycloSPORINE (RESTASIS) 0.05 % ophthalmic emulsion Place 1 drop into both eyes 2 (two) times daily.   EPINEPHrine 0.3 mg/0.3 mL IJ SOAJ injection Inject 0.3 mg into the muscle as needed for anaphylaxis.   Erenumab-aooe (AIMOVIG) 140 MG/ML SOAJ Inject 140 mg into the skin every 30 (thirty) days.   ferrous sulfate 325 (65 FE) MG tablet Take 1 tablet (325 mg total) by mouth 3 (three) times daily with meals.   FLUoxetine (PROZAC) 20 MG capsule Take 1 capsule (20 mg total) by mouth daily.   fluticasone (FLONASE) 50 MCG/ACT nasal spray Place 2 sprays into both nostrils daily.   gabapentin (NEURONTIN) 300 MG capsule Take 1 capsule (300 mg total) by mouth at bedtime.   levocetirizine (XYZAL) 5 MG tablet Take 1 tablet (5 mg total) by mouth every evening.   naproxen (NAPROSYN) 500 MG tablet Take 500 mg by mouth 2 (two) times daily.   omeprazole (PRILOSEC) 40 MG capsule Take 1 capsule (40 mg total) by mouth daily.   PROAIR RESPICLICK 108 (90 Base) MCG/ACT AEPB USE 2 INHALATIONS BY MOUTH INTO  THE LUNGS EVERY 4 HOURS AS  NEEDED.   SYRINGE/NEEDLE, DISP, 1 ML 25G X 5/8" 1 ML MISC Match with B12   traZODone (DESYREL) 100 MG tablet Take 1 tablet (100 mg total) by mouth at bedtime.   Ubrogepant (UBRELVY) 100 MG TABS Take 1 tablet by mouth as needed (May repeat in 2 hours.  Maximum 2 tablets in 24 hours).   Vitamin D, Ergocalciferol, (DRISDOL) 50000 units CAPS capsule Take 1 capsule (50,000 Units total) by mouth every 7 (seven) days.   [DISCONTINUED] cyanocobalamin (VITAMIN B12) injection 1,000 mcg    No facility-administered encounter medications on file as of 07/19/2022.    Allergies (verified) Fish  allergy   History: Past Medical History:  Diagnosis Date   Anemia    Anxiety    Asthma    Chronic pain    Congenital hip dislocation    GERD (gastroesophageal reflux disease)    Iron deficiency anemia 04/14/2018   Kidney mass 2017   Migraine    Osteoporosis    Pathological dislocation of shoulder joint, bilateral    congential   PTSD (post-traumatic stress disorder)    Sleep apnea    Systemic lupus erythematosus (HCC)    Urticaria    Past Surgical History:  Procedure Laterality Date   CESAREAN SECTION  2005   CESAREAN SECTION W/BTL  2010   HIP SURGERY     in childhood for congenital hip dislocation   LAPAROSCOPIC GASTRIC SLEEVE RESECTION  2014   STAPEDECTOMY  11/01/2011   L postauricular stapedectomy with CO2 laser and insertion of 6 x 4.75 mm SMart Piston   TOTAL HIP ARTHROPLASTY     05/2000 R, 11/2000 L   Family History  Problem Relation Age of Onset   Asthma Mother    Liver cancer Mother    Heart Problems Mother        heart attack and heart failure   Kidney Stones Mother    Osteoporosis Mother    Stroke Mother    Depression Mother    Anxiety disorder Mother    Kidney Stones Father    Hernia Father    Kidney disease Father    Depression Sister    Anxiety disorder Sister    Asthma Sister    Cancer Maternal Grandmother    Cancer Paternal Grandmother    Cancer Other    Social History   Socioeconomic History   Marital status: Single    Spouse name: Not on file   Number of children: 2   Years of education: some high school   Highest education level: 11th grade  Occupational History   Occupation: Disabled   Tobacco Use   Smoking status: Never    Passive exposure: Past   Smokeless tobacco: Never  Vaping Use   Vaping Use: Never used  Substance and Sexual Activity   Alcohol use: Not Currently    Comment: when she was 52 years old; none after having children    Drug use: No   Sexual activity: Not Currently    Partners: Male    Birth  control/protection: Condom, Surgical  Other Topics Concern   Not on file  Social History Narrative   Lives with her 2 children and dog here in [place].   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    She likes to spend time with her children and write poems.   Patient lives in one story home, Right handed    Social Determinants  of Health   Financial Resource Strain: Medium Risk (07/19/2022)   Overall Financial Resource Strain (CARDIA)    Difficulty of Paying Living Expenses: Somewhat hard  Food Insecurity: Food Insecurity Present (07/19/2022)   Hunger Vital Sign    Worried About Running Out of Food in the Last Year: Sometimes true    Ran Out of Food in the Last Year: Sometimes true  Transportation Needs: Unmet Transportation Needs (07/19/2022)   PRAPARE - Administrator, Civil Service (Medical): No    Lack of Transportation (Non-Medical): Yes  Physical Activity: Inactive (07/19/2022)   Exercise Vital Sign    Days of Exercise per Week: 0 days    Minutes of Exercise per Session: 0 min  Stress: Stress Concern Present (07/19/2022)   [employer] of Occupational Health - Occupational Stress Questionnaire    Feeling of Stress : To some extent  Social Connections: Socially Isolated (07/19/2022)   Social Connection and Isolation Panel [NHANES]    Frequency of Communication with Friends and Family: More than three times a week    Frequency of Social Gatherings with Friends and Family: Once a week    Attends Religious Services: Never    Database administrator or Organizations: No    Attends Engineer, structural: Never    Marital Status: Never married    Tobacco Counseling Counseling given: Not Answered   Clinical Intake:  Pre-visit preparation completed: Yes  Pain : No/denies pain  Diabetes: No  How often do you need to have someone help you when you read instructions, pamphlets, or other written materials from your doctor or  pharmacy?: 1 - Never  Diabetic?No   Interpreter Needed?: No  Information entered by :: Kandis Fantasia LPN   Activities of Daily Living    07/19/2022    6:14 PM  In your present state of health, do you have any difficulty performing the following activities:  Hearing? 0  Vision? 0  Difficulty concentrating or making decisions? 1  Walking or climbing stairs? 1  Dressing or bathing? 0  Doing errands, shopping? 1  Preparing Food and eating ? N  Using the Toilet? N  In the past six months, have you accidently leaked urine? N  Do you have problems with loss of bowel control? N  Managing your Medications? N  Managing your Finances? N  Housekeeping or managing your Housekeeping? Y    Patient Care Team: Billey Co, MD as PCP - General (Family Medicine) Jodelle Red, MD as PCP - Cardiology (Cardiology) Drema Dallas, DO as Consulting Physician (Neurology) Malachy Mood, MD as Consulting Physician (Hematology and Oncology) Cleotis Nipper, MD as Consulting Physician (Psychiatry)  Indicate any recent Medical Services you may have received from other than [facility] providers in the past year (date may be approximate).     Assessment:   This is a routine wellness examination for Raiya.  Hearing/Vision screen Hearing Screening - Comments:: Denies hearing difficulties   Vision Screening - Comments:: No vision problems; will schedule routine eye exam soon    Dietary issues and exercise activities discussed: Current Exercise Habits: The patient does not participate in regular exercise at present   Goals Addressed             This Visit's Progress    Increase personal care service hours        Depression Screen    07/19/2022    6:11 PM 05/07/2022   10:02 AM 07/31/2021    1:30  PM 07/01/2021    1:47 PM 02/17/2021   11:44 AM 07/22/2020   10:14 AM 01/25/2020    9:14 AM  PHQ 2/9 Scores  PHQ - 2 Score 0 2 2 2  2 2   PHQ- 9 Score  6 8 8  3 7      Information is  confidential and restricted. Go to Review Flowsheets to unlock data.    Fall Risk    07/19/2022    6:13 PM 02/20/2020    9:07 AM 10/05/2019   11:04 AM 06/28/2019    9:19 AM 02/21/2019   12:56 PM  Fall Risk   Falls in the past year? 0 0 1 1 1   Number falls in past yr: 0 0 1 1 1   Injury with Fall? 0 0 0 1 1  Risk for fall due to : Impaired balance/gait;Impaired mobility   History of fall(s)   Follow up Falls prevention discussed;Education provided;Falls evaluation completed  Falls evaluation completed;Follow up appointment    Comment   MD informed      FALL RISK PREVENTION PERTAINING TO THE HOME:  Any stairs in or around the home? No  If so, are there any without handrails? No  Home free of loose throw rugs in walkways, pet beds, electrical cords, etc? Yes  Adequate lighting in your home to reduce risk of falls? Yes   ASSISTIVE DEVICES UTILIZED TO PREVENT FALLS:  Life alert? No  Use of a cane, walker or w/c? No  Grab bars in the bathroom? Yes  Shower chair or bench in shower? No  Elevated toilet seat or a handicapped toilet? Yes   TIMED UP AND GO:  Was the test performed? No . Telephonic visit   Cognitive Function:        07/19/2022    6:14 PM 10/30/2018   10:27 AM  6CIT Screen  What Year? 0 points 0 points  What month? 0 points 0 points  What time? 0 points 0 points  Count back from 20 0 points 0 points  Months in reverse 0 points 0 points  Repeat phrase 0 points 0 points  Total Score 0 points 0 points    Immunizations Immunization History  Administered Date(s) Administered   Influenza,inj,Quad PF,6+ Mos 11/15/2016, 12/03/2020   PFIZER Comirnaty(Gray Top)Covid-19 Tri-Sucrose Vaccine 10/05/2019, 10/29/2019   PFIZER(Purple Top)SARS-COV-2 Vaccination 10/05/2019, 10/29/2019   Tdap 12/16/2016    TDAP status: Up to date  Flu Vaccine status: Up to date  Pneumococcal vaccine status: Up to date  Covid-19 vaccine status: Information provided on how to obtain  vaccines.   Qualifies for Shingles Vaccine? Yes   Zostavax completed No   Shingrix Completed?: No.    Education has been provided regarding the importance of this vaccine. Patient has been advised to call insurance company to determine out of pocket expense if they have not yet received this vaccine. Advised may also receive vaccine at local pharmacy or Health Dept. Verbalized acceptance and understanding.  Screening Tests Health Maintenance  Topic Date Due   Zoster Vaccines- Shingrix (1 of 2) Never done   COVID-19 Vaccine (5 - 2023-24 season) 11/06/2021   INFLUENZA VACCINE  10/07/2022   Medicare Annual Wellness (AWV)  07/19/2023   MAMMOGRAM  01/15/2024   PAP SMEAR-Modifier  07/01/2024   DTaP/Tdap/Td (2 - Td or Tdap) 12/17/2026   COLONOSCOPY (Pts 45-85yrs Insurance coverage will need to be confirmed)  04/24/2031   Hepatitis C Screening  Completed   HIV Screening  Completed  HPV VACCINES  Aged Out    Health Maintenance  Health Maintenance Due  Topic Date Due   Zoster Vaccines- Shingrix (1 of 2) Never done   COVID-19 Vaccine (5 - 2023-24 season) 11/06/2021    Colorectal cancer screening: Type of screening: Colonoscopy. Completed 04/23/21. Repeat every 10 years  Mammogram status: Completed 01/14/22. Repeat every year  Lung Cancer Screening: (Low Dose CT Chest recommended if Age 50-80 years, 30 pack-year currently smoking OR have quit w/in 15years.) does not qualify.   Lung Cancer Screening Referral: n/a  Additional Screening:  Hepatitis C Screening: does qualify; Completed 07/22/20  Vision Screening: Recommended annual ophthalmology exams for early detection of glaucoma and other disorders of the eye. Is the patient up to date with their annual eye exam?  No  Who is the provider or what is the name of the office in which the patient attends annual eye exams? none If pt is not established with a provider, would they like to be referred to a provider to establish care? No .    Dental Screening: Recommended annual dental exams for proper oral hygiene  Community Resource Referral / Chronic Care Management: CRR required this visit?  No   CCM required this visit?  No      Plan:     I have personally reviewed and noted the following in the patient's chart:   Medical and social history Use of alcohol, tobacco or illicit drugs  Current medications and supplements including opioid prescriptions. Patient is not currently taking opioid prescriptions. Functional ability and status Nutritional status Physical activity Advanced directives List of other physicians Hospitalizations, surgeries, and ER visits in previous 12 months Vitals Screenings to include cognitive, depression, and falls Referrals and appointments  In addition, I have reviewed and discussed with patient certain preventive protocols, quality metrics, and best practice recommendations. A written personalized care plan for preventive services as well as general preventive health recommendations were provided to patient.     Durwin Nora, [place]   [id number]   Due to this being a virtual visit, the after visit summary with patients personalized plan was offered to patient via mail or my-chart. Patient would like to access on my-chart  Nurse Notes: Patient is asking for assistance in getting more personal care service hours due to disability.

## 2022-07-22 ENCOUNTER — Ambulatory Visit (INDEPENDENT_AMBULATORY_CARE_PROVIDER_SITE_OTHER): Payer: 59

## 2022-07-22 DIAGNOSIS — J309 Allergic rhinitis, unspecified: Secondary | ICD-10-CM

## 2022-07-29 ENCOUNTER — Ambulatory Visit (INDEPENDENT_AMBULATORY_CARE_PROVIDER_SITE_OTHER): Payer: 59

## 2022-07-29 DIAGNOSIS — J309 Allergic rhinitis, unspecified: Secondary | ICD-10-CM

## 2022-08-01 ENCOUNTER — Other Ambulatory Visit (HOSPITAL_COMMUNITY): Payer: Self-pay | Admitting: Psychiatry

## 2022-08-01 DIAGNOSIS — F331 Major depressive disorder, recurrent, moderate: Secondary | ICD-10-CM

## 2022-08-01 DIAGNOSIS — F4310 Post-traumatic stress disorder, unspecified: Secondary | ICD-10-CM

## 2022-08-06 ENCOUNTER — Other Ambulatory Visit: Payer: Self-pay

## 2022-08-06 ENCOUNTER — Encounter: Payer: Self-pay | Admitting: Internal Medicine

## 2022-08-06 ENCOUNTER — Ambulatory Visit (INDEPENDENT_AMBULATORY_CARE_PROVIDER_SITE_OTHER): Payer: 59 | Admitting: Internal Medicine

## 2022-08-06 VITALS — BP 124/88 | HR 86 | Temp 98.0°F | Resp 16 | Ht 59.0 in | Wt 215.2 lb

## 2022-08-06 DIAGNOSIS — T7800XA Anaphylactic reaction due to unspecified food, initial encounter: Secondary | ICD-10-CM

## 2022-08-06 DIAGNOSIS — K219 Gastro-esophageal reflux disease without esophagitis: Secondary | ICD-10-CM

## 2022-08-06 DIAGNOSIS — T7800XD Anaphylactic reaction due to unspecified food, subsequent encounter: Secondary | ICD-10-CM

## 2022-08-06 DIAGNOSIS — J3089 Other allergic rhinitis: Secondary | ICD-10-CM | POA: Diagnosis not present

## 2022-08-06 DIAGNOSIS — J4540 Moderate persistent asthma, uncomplicated: Secondary | ICD-10-CM | POA: Diagnosis not present

## 2022-08-06 MED ORDER — AZELASTINE HCL 0.1 % NA SOLN: 2.0000 | Freq: Two times a day (BID) | NASAL | 12 refills | Status: DC

## 2022-08-06 MED ORDER — PANTOPRAZOLE SODIUM 40 MG PO TBEC: 40.0000 mg | DELAYED_RELEASE_TABLET | Freq: Every day | ORAL | 5 refills | Status: DC

## 2022-08-06 MED ORDER — OLOPATADINE HCL 0.2 % OP SOLN: 1.0000 [drp] | OPHTHALMIC | 5 refills | Status: DC

## 2022-08-06 MED ORDER — FAMOTIDINE 20 MG PO TABS: 20.0000 mg | ORAL_TABLET | Freq: Two times a day (BID) | ORAL | 5 refills | Status: DC

## 2022-08-06 NOTE — Progress Notes (Signed)
Follow Up Note  RE: Melissa Gaines MRN: [id number] DOB: Aug 15, 1970 Date of Office Visit: 08/06/2022  Referring provider: Billey Co, MD Primary care provider: Billey Co, MD  Chief Complaint: Asthma (Yearly - [place]), Gastroesophageal Reflux (Yearly - Bad), Food Alllergy (Yearly - Patient stated she has avoided all food allergens), Migraine (Yearly - Controlled), and Allergic Rhinitis  (Yearly - Getting worse)  History of Present Illness: I had the pleasure of seeing Melissa Gaines for a follow up visit at the [facility] on 08/06/2022. She is a 52 y.o. female, who is being followed for asthma, gerd, food allergy allergic rhinitis on AIT, migraines . Her previous allergy office visit was on 09/26/22 with Dr. Marlynn Perking. Today is a regular follow up visit.  History obtained from patient, chart review.  Asthma is well controlled on symbicort 2 puffs twice daily  Denies any cough, wheeze, dyspnea.  Denies any ED/OCS/UC/ABX since last visit.  If she goes outside, might need albuterol.    She reports increased itchy watery eyes, itchy nose with tree and grass exposure.  Currently on on AIT to mold.  Denies any SR/LR.  On benadryl and levocetrizine, flonase 1 SEN daily.  Has not used any allergy eye drops.   She is interested in blood work to double check pollen allergy     GERD has worsened.  Still taking omeprazole 20mg  daily.  Daily symptoms.   She continues to avoid all seafood. No accidental ingestions or reacitons since last visit. She is not interested in food challenge.  She does have an epipen.   Pertinent History/Diagnostics:  - Asthma: moderate persistent, dx'd after 2nd pregnancy, triggered by rhinitis, exercise, URI   - no hospitalizations or intubations   - normal spirometry (09/25/21): ratio 98%, 2.02L, 97% FEV1 (pre), - Allergic Rhinitis:   - SPT environmental panel (09/26/22): Mold   - AIT started 10/15/21 Vial 1  (mold)  - Food Allergy (salmon and tilapia)  - Hx of reaction:  urticaria, throat tightness, facial angioedema   - SPT select foods (09/26/22): negative,   - sIgE (09/26/22) negative   - offered food challenge, but patient declined  - GERD: persistent   - switched from omeprazole to pantoprazole and famotidine 08/06/22    Assessment and Plan: Melissa Gaines is a 52 y.o. female with: Other allergic rhinitis - Plan: Allergens w/Total IgE Area 2  Moderate persistent asthma without complication - Plan: Spirometry with Graph  Gastroesophageal reflux disease without esophagitis  Allergy with anaphylaxis due to food   Plan: Patient Instructions  Perennial Rhinitis: not well  controlled  - will get labse to see if you have new allergies given symptoms around grass and tree  - Continue with: Xyzal (levocetirizine) 5mg  tablet once daily and Flonase (fluticasone) two sprays per nostril daily - Start taking: Astelin (azelastine) 2 sprays per nostril 1-2 times daily as needed and Pataday (olopatadine) one drop per eye twice daily as needed - You can use an extra dose of the antihistamine, if needed, for breakthrough symptoms.  - Consider nasal saline rinses 1-2 times daily to remove allergens from the nasal cavities as well as help with mucous clearance (this is especially helpful to do before the nasal sprays are given) -Continue allergy injections and carry EpiPen on allergy injection today   Food allergy:  - please strictly avoid seafood given history of reactions.  - for SKIN only reaction, okay to take Benadryl 1 capsules every  6 hours - for SKIN + ANY additional symptoms, OR IF concern for LIFE THREATENING reaction = Epipen Autoinjector EpiPen 0.3 mg. - If using Epinephrine autoinjector, call 911 - A food allergy action plan has been provided and discussed. - Medic Alert identification is recommended.  Moderate Persistent Asthma: well  controlled - Breathing test today showed: looked  great!  PLAN:  - Spacer use reviewed. - Daily controller medication(s): Symbicort 160/4.49mcg two puffs once daily - Prior to physical activity: albuterol 2 puffs 10-15 minutes before physical activity. - Rescue medications: albuterol 4 puffs every 4-6 hours as needed - Changes during respiratory infections or worsening symptoms: Increase Symbicort to 3 puffs twice daily for TWO WEEKS. - Asthma control goals:  * Full participation in all desired activities (may need albuterol before activity) * Albuterol use two time or less a week on average (not counting use with activity) * Cough interfering with sleep two time or less a month * Oral steroids no more than once a year * No hospitalizations    GERD  - Stop omeprazole  - start pantoprazole 40mg  daily (take 30 minutes before you eat)  - Start famotidine 20mg  at night   Follow up: in 3 months, we will call you with lab results   Thank you so much for letting me partake in your care today.  Don't hesitate to reach out if you have any additional concerns!  Ferol Luz, MD  [facility] [place] [place]    Meds ordered this encounter  Medications   pantoprazole (PROTONIX) 40 MG tablet    Sig: Take 1 tablet (40 mg total) by mouth daily.    Dispense:  30 tablet    Refill:  5   azelastine (ASTELIN) 0.1 % nasal spray    Sig: Place 2 sprays into both nostrils 2 (two) times daily. Use in each nostril as directed    Dispense:  30 mL    Refill:  12   Olopatadine HCl (PATADAY) 0.2 % SOLN    Sig: Place 1 drop into both eyes 1 day or 1 dose.    Dispense:  2.5 mL    Refill:  5   famotidine (PEPCID) 20 MG tablet    Sig: Take 1 tablet (20 mg total) by mouth 2 (two) times daily.    Dispense:  30 tablet    Refill:  5    Lab Orders         Allergens w/Total IgE Area 2     Diagnostics: Spirometry:  Tracings reviewed. Her effort: Good reproducible efforts. FVC: 2.9L FEV1: 2.36L, 109% predicted FEV1/FVC ratio:  81% Interpretation: Spirometry consistent with normal pattern.  Please see scanned spirometry results for details.     Medication List:  Current Outpatient Medications  Medication Sig Dispense Refill   acetaminophen (TYLENOL) 500 MG tablet Take 500 mg by mouth every 6 (six) hours as needed for headache.     acetaminophen-codeine (TYLENOL #3) 300-30 MG tablet Take 1-2 tablets by mouth every 4 (four) hours as needed for moderate pain.     albuterol (VENTOLIN HFA) 108 (90 Base) MCG/ACT inhaler Inhale 2 puffs into the lungs every 6 (six) hours as needed for wheezing or shortness of breath. 8 g 2   apixaban (ELIQUIS) 5 MG TABS tablet Take 1 tablet (5 mg total) by mouth 2 (two) times daily. 60 tablet 11   atorvastatin (LIPITOR) 40 MG tablet Take 1 tablet (40 mg total) by mouth daily. 90 tablet 3  azelastine (ASTELIN) 0.1 % nasal spray Place 2 sprays into both nostrils 2 (two) times daily. Use in each nostril as directed 30 mL 12   budesonide-formoterol (SYMBICORT) 160-4.5 MCG/ACT inhaler Inhale 2 puffs into the lungs daily. 1 each 12   cetirizine (ZYRTEC) 10 MG tablet Take 1 tablet (10 mg total) by mouth daily. 30 tablet 11   cyanocobalamin (,VITAMIN B-12,) 1000 MCG/ML injection Inject 1 mL (1,000 mcg total) into the skin every 30 (thirty) days. 5 mL 0   cycloSPORINE (RESTASIS) 0.05 % ophthalmic emulsion Place 1 drop into both eyes 2 (two) times daily. 0.4 mL 0   EPINEPHrine 0.3 mg/0.3 mL IJ SOAJ injection Inject 0.3 mg into the muscle as needed for anaphylaxis. 2 each 1   Erenumab-aooe (AIMOVIG) 140 MG/ML SOAJ Inject 140 mg into the skin every 30 (thirty) days. 1.12 mL 11   famotidine (PEPCID) 20 MG tablet Take 1 tablet (20 mg total) by mouth 2 (two) times daily. 30 tablet 5   ferrous sulfate 325 (65 FE) MG tablet Take 1 tablet (325 mg total) by mouth 3 (three) times daily with meals. 90 tablet 1   FLUoxetine (PROZAC) 20 MG capsule Take 1 capsule (20 mg total) by mouth daily. 30 capsule 1    fluticasone (FLONASE) 50 MCG/ACT nasal spray Place 2 sprays into both nostrils daily. 16 g 6   gabapentin (NEURONTIN) 300 MG capsule Take 1 capsule (300 mg total) by mouth at bedtime. 30 capsule 1   levocetirizine (XYZAL) 5 MG tablet Take 1 tablet (5 mg total) by mouth every evening. 30 tablet 5   naproxen (NAPROSYN) 500 MG tablet Take 500 mg by mouth 2 (two) times daily.     Olopatadine HCl (PATADAY) 0.2 % SOLN Place 1 drop into both eyes 1 day or 1 dose. 2.5 mL 5   pantoprazole (PROTONIX) 40 MG tablet Take 1 tablet (40 mg total) by mouth daily. 30 tablet 5   PROAIR RESPICLICK 108 (90 Base) MCG/ACT AEPB USE 2 INHALATIONS BY MOUTH INTO  THE LUNGS EVERY 4 HOURS AS  NEEDED. 3 each 6   SYRINGE/NEEDLE, DISP, 1 ML 25G X 5/8" 1 ML MISC Match with B12 3 each 3   traZODone (DESYREL) 100 MG tablet Take 1 tablet (100 mg total) by mouth at bedtime. 90 tablet 0   Ubrogepant (UBRELVY) 100 MG TABS Take 1 tablet by mouth as needed (May repeat in 2 hours.  Maximum 2 tablets in 24 hours). 10 tablet 11   Vitamin D, Ergocalciferol, (DRISDOL) 50000 units CAPS capsule Take 1 capsule (50,000 Units total) by mouth every 7 (seven) days. 8 capsule 0   No current facility-administered medications for this visit.   Allergies: Allergies  Allergen Reactions   Fish Allergy 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 Throat closes and short of breath.   I reviewed her past medical history, social history, family history, and environmental history and no significant changes have been reported from her previous visit.  ROS: All others negative except as noted per HPI.   Objective: BP 124/88 (BP Location: Left Arm, Patient Position: Sitting, Cuff Size: Large)   Pulse 86   Temp 98 F (36.7 C) (Temporal)   Resp 16   Ht 4\' 11"  (1.499 m)   Wt 215 lb 3.2 oz (97.6 kg)   SpO2 94%   BMI 43.47 kg/m  Body mass index is 43.47 kg/m. General Appearance:  Alert, cooperative, no distress, appears stated age  Head:  Normocephalic,  without obvious abnormality, atraumatic  Eyes:  Conjunctiva clear, EOM's intact  Nose: Nares normal,  erythematous nasal mucosa, hypertrophic turbinates, no visible anterior polyps, and septum midline  Throat: Lips, tongue normal; teeth and gums normal, normal posterior oropharynx  Neck: Supple, symmetrical  Lungs:   clear to auscultation bilaterally, Respirations unlabored, no coughing  Heart:  regular rate and rhythm and no murmur, Appears well perfused  Extremities: No edema  Skin: Skin color, texture, turgor normal, no rashes or lesions on visualized portions of skin  Neurologic: No gross deficits   Previous notes and tests were reviewed. The plan was reviewed with the patient/family, and all questions/concerned were addressed.  It was my pleasure to see Krishika today and participate in her care. Please feel free to contact me with any questions or concerns.  Sincerely,  Ferol Luz, MD  Allergy & Immunology  [facility] Office: [phone]

## 2022-08-06 NOTE — Patient Instructions (Addendum)
Perennial Rhinitis: not well  controlled  - will get labse to see if you have new allergies given symptoms around grass and tree  - Continue with: Xyzal (levocetirizine) 5mg  tablet once daily and Flonase (fluticasone) two sprays per nostril daily - Start taking: Astelin (azelastine) 2 sprays per nostril 1-2 times daily as needed and Pataday (olopatadine) one drop per eye twice daily as needed - You can use an extra dose of the antihistamine, if needed, for breakthrough symptoms.  - Consider nasal saline rinses 1-2 times daily to remove allergens from the nasal cavities as well as help with mucous clearance (this is especially helpful to do before the nasal sprays are given) -Continue allergy injections and carry EpiPen on allergy injection today   Food allergy:  - please strictly avoid seafood given history of reactions.  - for SKIN only reaction, okay to take Benadryl 1 capsules every 6 hours - for SKIN + ANY additional symptoms, OR IF concern for LIFE THREATENING reaction = Epipen Autoinjector EpiPen 0.3 mg. - If using Epinephrine autoinjector, call 911 - A food allergy action plan has been provided and discussed. - Medic Alert identification is recommended.  Moderate Persistent Asthma: well  controlled - Breathing test today showed: looked great!  PLAN:  - Spacer use reviewed. - Daily controller medication(s): Symbicort 160/4.50mcg two puffs once daily - Prior to physical activity: albuterol 2 puffs 10-15 minutes before physical activity. - Rescue medications: albuterol 4 puffs every 4-6 hours as needed - Changes during respiratory infections or worsening symptoms: Increase Symbicort to 3 puffs twice daily for TWO WEEKS. - Asthma control goals:  * Full participation in all desired activities (may need albuterol before activity) * Albuterol use two time or less a week on average (not counting use with activity) * Cough interfering with sleep two time or less a month * Oral steroids no  more than once a year * No hospitalizations    GERD  - Stop omeprazole  - start pantoprazole 40mg  daily (take 30 minutes before you eat)  - Start famotidine 20mg  at night   Follow up: in 3 months, we will call you with lab results   Thank you so much for letting me partake in your care today.  Don't hesitate to reach out if you have any additional concerns!  Ferol Luz, MD  [facility] [place] [place]

## 2022-08-09 LAB — ALLERGENS W/TOTAL IGE AREA 2

## 2022-08-11 ENCOUNTER — Inpatient Hospital Stay: Payer: 59

## 2022-08-11 NOTE — Progress Notes (Signed)
Blood work was negative to all environmentals.  It did not show any new allergies to grass or trees.  Can someone let patient know?

## 2022-08-18 ENCOUNTER — Inpatient Hospital Stay: Payer: 59

## 2022-08-18 ENCOUNTER — Inpatient Hospital Stay: Payer: 59 | Attending: Nurse Practitioner

## 2022-08-19 ENCOUNTER — Ambulatory Visit (INDEPENDENT_AMBULATORY_CARE_PROVIDER_SITE_OTHER): Payer: 59

## 2022-08-19 DIAGNOSIS — J309 Allergic rhinitis, unspecified: Secondary | ICD-10-CM | POA: Diagnosis not present

## 2022-08-23 ENCOUNTER — Other Ambulatory Visit: Payer: Self-pay

## 2022-08-23 DIAGNOSIS — D6861 Antiphospholipid syndrome: Secondary | ICD-10-CM

## 2022-08-23 MED ORDER — APIXABAN 5 MG PO TABS: 5.0000 mg | ORAL_TABLET | Freq: Two times a day (BID) | ORAL | 0 refills | Status: DC

## 2022-08-27 ENCOUNTER — Ambulatory Visit (INDEPENDENT_AMBULATORY_CARE_PROVIDER_SITE_OTHER): Payer: 59 | Admitting: *Deleted

## 2022-08-27 DIAGNOSIS — J309 Allergic rhinitis, unspecified: Secondary | ICD-10-CM | POA: Diagnosis not present

## 2022-08-28 ENCOUNTER — Other Ambulatory Visit: Payer: Self-pay | Admitting: Family

## 2022-08-28 ENCOUNTER — Other Ambulatory Visit (HOSPITAL_COMMUNITY): Payer: Self-pay | Admitting: Psychiatry

## 2022-08-28 DIAGNOSIS — F4310 Post-traumatic stress disorder, unspecified: Secondary | ICD-10-CM

## 2022-09-03 ENCOUNTER — Ambulatory Visit (INDEPENDENT_AMBULATORY_CARE_PROVIDER_SITE_OTHER): Payer: 59 | Admitting: *Deleted

## 2022-09-03 DIAGNOSIS — J309 Allergic rhinitis, unspecified: Secondary | ICD-10-CM | POA: Diagnosis not present

## 2022-09-06 ENCOUNTER — Encounter (HOSPITAL_COMMUNITY): Payer: Self-pay | Admitting: Internal Medicine

## 2022-09-08 ENCOUNTER — Inpatient Hospital Stay: Payer: 59

## 2022-09-09 ENCOUNTER — Other Ambulatory Visit: Payer: Self-pay | Admitting: Family Medicine

## 2022-09-09 DIAGNOSIS — D6861 Antiphospholipid syndrome: Secondary | ICD-10-CM

## 2022-09-13 ENCOUNTER — Encounter (HOSPITAL_COMMUNITY): Payer: Self-pay | Admitting: Psychiatry

## 2022-09-13 ENCOUNTER — Telehealth (HOSPITAL_BASED_OUTPATIENT_CLINIC_OR_DEPARTMENT_OTHER): Payer: 59 | Admitting: Psychiatry

## 2022-09-13 VITALS — Wt 215.0 lb

## 2022-09-13 DIAGNOSIS — F4310 Post-traumatic stress disorder, unspecified: Secondary | ICD-10-CM | POA: Diagnosis not present

## 2022-09-13 DIAGNOSIS — F331 Major depressive disorder, recurrent, moderate: Secondary | ICD-10-CM

## 2022-09-13 MED ORDER — FLUOXETINE HCL 20 MG PO CAPS
20.0000 mg | ORAL_CAPSULE | Freq: Every day | ORAL | 0 refills | Status: DC
Start: 2022-09-13 — End: 2022-12-14

## 2022-09-13 MED ORDER — GABAPENTIN 300 MG PO CAPS: 300.0000 mg | ORAL_CAPSULE | Freq: Every day | ORAL | 0 refills | Status: DC

## 2022-09-13 MED ORDER — TRAZODONE HCL 100 MG PO TABS: 100.0000 mg | ORAL_TABLET | Freq: Every day | ORAL | 0 refills | Status: DC
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
## 2022-09-14 ENCOUNTER — Encounter (HOSPITAL_COMMUNITY): Payer: Self-pay | Admitting: Internal Medicine

## 2022-09-15 ENCOUNTER — Ambulatory Visit (INDEPENDENT_AMBULATORY_CARE_PROVIDER_SITE_OTHER): Payer: 59

## 2022-09-15 DIAGNOSIS — J309 Allergic rhinitis, unspecified: Secondary | ICD-10-CM | POA: Diagnosis not present

## 2022-09-16 ENCOUNTER — Inpatient Hospital Stay: Payer: 59 | Attending: Nurse Practitioner

## 2022-09-16 ENCOUNTER — Other Ambulatory Visit: Payer: Self-pay

## 2022-09-16 DIAGNOSIS — E538 Deficiency of other specified B group vitamins: Secondary | ICD-10-CM | POA: Diagnosis not present

## 2022-09-16 DIAGNOSIS — D508 Other iron deficiency anemias: Secondary | ICD-10-CM

## 2022-09-16 MED ORDER — CYANOCOBALAMIN 1000 MCG/ML IJ SOLN
1000.0000 ug | Freq: Once | INTRAMUSCULAR | Status: AC
  Administered 2022-09-16: 1000 ug via INTRAMUSCULAR
  Filled 2022-09-16: qty 1

## 2022-09-24 ENCOUNTER — Ambulatory Visit (INDEPENDENT_AMBULATORY_CARE_PROVIDER_SITE_OTHER): Payer: 59

## 2022-09-24 ENCOUNTER — Ambulatory Visit: Payer: Medicare Other | Admitting: Internal Medicine

## 2022-09-24 DIAGNOSIS — J309 Allergic rhinitis, unspecified: Secondary | ICD-10-CM | POA: Diagnosis not present

## 2022-09-30 ENCOUNTER — Ambulatory Visit (INDEPENDENT_AMBULATORY_CARE_PROVIDER_SITE_OTHER): Payer: 59

## 2022-09-30 DIAGNOSIS — J309 Allergic rhinitis, unspecified: Secondary | ICD-10-CM | POA: Diagnosis not present

## 2022-10-02 ENCOUNTER — Other Ambulatory Visit: Payer: Self-pay | Admitting: Family Medicine

## 2022-10-02 DIAGNOSIS — D6861 Antiphospholipid syndrome: Secondary | ICD-10-CM

## 2022-10-07 ENCOUNTER — Ambulatory Visit (INDEPENDENT_AMBULATORY_CARE_PROVIDER_SITE_OTHER): Payer: 59

## 2022-10-07 DIAGNOSIS — J309 Allergic rhinitis, unspecified: Secondary | ICD-10-CM | POA: Diagnosis not present

## 2022-10-13 ENCOUNTER — Inpatient Hospital Stay: Payer: 59

## 2022-10-14 ENCOUNTER — Ambulatory Visit (INDEPENDENT_AMBULATORY_CARE_PROVIDER_SITE_OTHER): Payer: 59

## 2022-10-14 DIAGNOSIS — J309 Allergic rhinitis, unspecified: Secondary | ICD-10-CM | POA: Diagnosis not present

## 2022-10-18 ENCOUNTER — Inpatient Hospital Stay: Payer: 59 | Attending: Nurse Practitioner

## 2022-10-18 ENCOUNTER — Other Ambulatory Visit: Payer: Self-pay

## 2022-10-18 DIAGNOSIS — D508 Other iron deficiency anemias: Secondary | ICD-10-CM

## 2022-10-18 DIAGNOSIS — E538 Deficiency of other specified B group vitamins: Secondary | ICD-10-CM | POA: Insufficient documentation

## 2022-10-18 MED ORDER — CYANOCOBALAMIN 1000 MCG/ML IJ SOLN
1000.0000 ug | INTRAMUSCULAR | Status: DC
  Administered 2022-10-18: 1000 ug via INTRAMUSCULAR
  Filled 2022-10-18: qty 1

## 2022-10-18 NOTE — Patient Instructions (Signed)
 Vitamin B12 Injection What is this medication? Vitamin B12 (VAHY tuh min B12) prevents and treats low vitamin B12 levels in your body. It is used in people who do not get enough vitamin B12 from their diet or when their digestive tract does not absorb enough. Vitamin B12 plays an important role in maintaining the health of your nervous system and red blood cells. This medicine may be used for other purposes; ask your health care provider or pharmacist if you have questions. COMMON BRAND NAME(S): B-12 Compliance Kit, B-12 Injection Kit, Cyomin, Dodex, LA-12, Nutri-Twelve, Physicians EZ Use B-12, Primabalt, Vitamin Deficiency Injectable System - B12 What should I tell my care team before I take this medication? They need to know if you have any of these conditions: Kidney disease Leber's disease Megaloblastic anemia An unusual or allergic reaction to cyanocobalamin, cobalt, other medications, foods, dyes, or preservatives Pregnant or trying to get pregnant Breast-feeding How should I use this medication? This medication is injected into a muscle or deeply under the skin. It is usually given in a clinic or care team's office. However, your care team may teach you how to inject yourself. Follow all instructions. Talk to your care team about the use of this medication in children. Special care may be needed. Overdosage: If you think you have taken too much of this medicine contact a poison control center or emergency room at once. NOTE: This medicine is only for you. Do not share this medicine with others. What if I miss a dose? If you are given your dose at a clinic or care team's office, call to reschedule your appointment. If you give your own injections, and you miss a dose, take it as soon as you can. If it is almost time for your next dose, take only that dose. Do not take double or extra doses. What may interact with this medication? Alcohol Colchicine This list may not describe all possible  interactions. Give your health care provider a list of all the medicines, herbs, non-prescription drugs, or dietary supplements you use. Also tell them if you smoke, drink alcohol, or use illegal drugs. Some items may interact with your medicine. What should I watch for while using this medication? Visit your care team regularly. You may need blood work done while you are taking this medication. You may need to follow a special diet. Talk to your care team. Limit your alcohol intake and avoid smoking to get the best benefit. What side effects may I notice from receiving this medication? Side effects that you should report to your care team as soon as possible: Allergic reactions--skin rash, itching, hives, swelling of the face, lips, tongue, or throat Swelling of the ankles, hands, or feet Trouble breathing Side effects that usually do not require medical attention (report to your care team if they continue or are bothersome): Diarrhea This list may not describe all possible side effects. Call your doctor for medical advice about side effects. You may report side effects to FDA at 1-800-FDA-1088. Where should I keep my medication? Keep out of the reach of children. Store at room temperature between 15 and 30 degrees C (59 and 85 degrees F). Protect from light. Throw away any unused medication after the expiration date. NOTE: This sheet is a summary. It may not cover all possible information. If you have questions about this medicine, talk to your doctor, pharmacist, or health care provider.  2024 Elsevier/Gold Standard (2020-11-04 00:00:00)

## 2022-10-20 NOTE — Progress Notes (Signed)
Name: Melissa Gaines  MRN/ DOB: [id number], 07/13/1970    Age/ Sex: 52 y.o., female    PCP: Levin Erp, MD   Reason for Endocrinology Evaluation: Pituitary Microadenoma      Date of Initial Endocrinology Evaluation: 10/26/2021    HPI: Melissa Gaines is a 52 y.o. female with a past medical history of SLE, migraine headaches and Hx of PE. The patient presented for initial endocrinology clinic visit on 10/26/2021 for consultative assistance with her Pituitary microadenoma .   During evaluation for left temporal pain she was noted to have a 4 mm pituitary enlargementon brain MRI 09/2020    Her pituitary hormones were normal on her initial visit to our clinic including prolactin, TFTs, IGF-I, ACTH, cortisol, FSH, and LH.    SUBJECTIVE:    Today (10/20/22): Melissa Gaines is here for follow-up on pituitary microadenoma.  She is on immunotherapy for allergic rhinitis She received iron infusion for anemia  Easy bruisability-yes Severe hypertension- no  DM- no Sudden/ severe headaches- has occasional headaches  Has sinus issues  Palpitations- occasional  Fluid retention- yes Change in shoe/ring size- no  She has DJD disease, follows with rheumatology for SLE  She has noted weight gain   LMP 2 months, irregular Patient denies any oral or intra-articular glucocorticoid intake   HISTORY:  Past Medical History:  Past Medical History:  Diagnosis Date   Anemia    Anxiety    Asthma    Chronic pain    Congenital hip dislocation    GERD (gastroesophageal reflux disease)    Iron deficiency anemia 04/14/2018   Kidney mass 2017   Migraine    Osteoporosis    Pathological dislocation of shoulder joint, bilateral    congential   PTSD (post-traumatic stress disorder)    Sleep apnea    Systemic lupus erythematosus (HCC)    Urticaria    Past Surgical History:  Past Surgical History:  Procedure Laterality Date    CESAREAN SECTION  2005   CESAREAN SECTION W/BTL  2010   HIP SURGERY     in childhood for congenital hip dislocation   LAPAROSCOPIC GASTRIC SLEEVE RESECTION  2014   STAPEDECTOMY  11/01/2011   L postauricular stapedectomy with CO2 laser and insertion of 6 x 4.75 mm SMart Piston   TOTAL HIP ARTHROPLASTY     05/2000 R, 11/2000 L    Social History:  reports that she has never smoked. She has been exposed to tobacco smoke. She has never used smokeless tobacco. She reports that she does not currently use alcohol. She reports that she does not use drugs. Family History: family history includes Anxiety disorder in her mother and sister; Asthma in her mother and sister; Cancer in her maternal grandmother, paternal grandmother, and another family member; Depression in her mother and sister; Heart Problems in her mother; Hernia in her father; Kidney Stones in her father and mother; Kidney disease in her father; Liver cancer in her mother; Osteoporosis in her mother; Stroke in her mother.   HOME MEDICATIONS: Allergies as of 10/21/2022       Reactions   Fish Allergy Anaphylaxis   To salmon and tilapia. Throat closes and short of breath.        Medication List        Accurate as of October 20, 2022  3:09 PM. If you have any questions, ask your nurse or doctor.  acetaminophen 500 MG tablet Commonly known as: TYLENOL Take 500 mg by mouth every 6 (six) hours as needed for headache.   acetaminophen-codeine 300-30 MG tablet Commonly known as: TYLENOL #3 Take 1-2 tablets by mouth every 4 (four) hours as needed for moderate pain.   Aimovig 140 MG/ML Soaj Generic drug: Erenumab-aooe Inject 140 mg into the skin every 30 (thirty) days.   albuterol 108 (90 Base) MCG/ACT inhaler Commonly known as: VENTOLIN HFA Inhale 2 puffs into the lungs every 6 (six) hours as needed for wheezing or shortness of breath.   ProAir RespiClick 108 (90 Base) MCG/ACT Aepb Generic drug: Albuterol Sulfate USE  2 INHALATIONS BY MOUTH INTO  THE LUNGS EVERY 4 HOURS AS  NEEDED.   atorvastatin 40 MG tablet Commonly known as: LIPITOR Take 1 tablet (40 mg total) by mouth daily.   azelastine 0.1 % nasal spray Commonly known as: ASTELIN Place 2 sprays into both nostrils 2 (two) times daily. Use in each nostril as directed   budesonide-formoterol 160-4.5 MCG/ACT inhaler Commonly known as: Symbicort Inhale 2 puffs into the lungs daily.   cetirizine 10 MG tablet Commonly known as: ZYRTEC Take 1 tablet (10 mg total) by mouth daily.   cyanocobalamin 1000 MCG/ML injection Commonly known as: VITAMIN B12 Inject 1 mL (1,000 mcg total) into the skin every 30 (thirty) days.   Eliquis 5 MG Tabs tablet Generic drug: apixaban TAKE 1 TABLET BY MOUTH TWICE  DAILY   EPINEPHrine 0.3 mg/0.3 mL Soaj injection Commonly known as: EPI-PEN INJECT INTRAMUSCULARLY 1 PEN AS  NEEDED FOR ALLERGIC RESPONSE AS  DIRECTED BY MD. Assunta Found MEDICAL  HELP AFTER USE.   famotidine 20 MG tablet Commonly known as: PEPCID Take 1 tablet (20 mg total) by mouth 2 (two) times daily.   ferrous sulfate 325 (65 FE) MG tablet Take 1 tablet (325 mg total) by mouth 3 (three) times daily with meals.   FLUoxetine 20 MG capsule Commonly known as: PROZAC Take 1 capsule (20 mg total) by mouth daily.   fluticasone 50 MCG/ACT nasal spray Commonly known as: FLONASE Place 2 sprays into both nostrils daily.   gabapentin 300 MG capsule Commonly known as: NEURONTIN Take 1 capsule (300 mg total) by mouth at bedtime.   levocetirizine 5 MG tablet Commonly known as: XYZAL Take 1 tablet (5 mg total) by mouth every evening.   Olopatadine HCl 0.2 % Soln Commonly known as: Pataday Place 1 drop into both eyes 1 day or 1 dose.   pantoprazole 40 MG tablet Commonly known as: PROTONIX Take 1 tablet (40 mg total) by mouth daily.   Restasis 0.05 % ophthalmic emulsion Generic drug: cycloSPORINE Place 1 drop into both eyes 2 (two) times daily.    SYRINGE/NEEDLE (DISP) 1 ML 25G X 5/8" 1 ML Misc Match with B12   traZODone 100 MG tablet Commonly known as: DESYREL Take 1 tablet (100 mg total) by mouth at bedtime.   Ubrelvy 100 MG Tabs Generic drug: Ubrogepant Take 1 tablet by mouth as needed (May repeat in 2 hours.  Maximum 2 tablets in 24 hours).   Vitamin D (Ergocalciferol) 1.25 MG (50000 UNIT) Caps capsule Commonly known as: DRISDOL Take 1 capsule (50,000 Units total) by mouth every 7 (seven) days.          REVIEW OF SYSTEMS: A comprehensive ROS was conducted with the patient and is negative except as per HPI    OBJECTIVE:  VS: BP 124/86 (BP Location: Left Arm, Patient Position: Sitting, Cuff Size:  Large)   Pulse 70   Ht 4\' 11"  (1.499 m)   Wt 213 lb (96.6 kg)   SpO2 98%   BMI 43.02 kg/m    Wt Readings from Last 3 Encounters:  08/06/22 215 lb 3.2 oz (97.6 kg)  07/19/22 209 lb (94.8 kg)  05/12/22 207 lb 6.4 oz (94.1 kg)     EXAM: General: Pt appears well and is in NAD  Neck: General: Supple without adenopathy. Thyroid: Thyroid size normal.  No goiter or nodules appreciated.  Lungs: Clear with good BS bilat with no rales, rhonchi, or wheezes  Heart: Auscultation: RRR.  Extremities:  BL LE: No pretibial edema normal ROM and strength.  Mental Status: Judgment, insight: Intact Orientation: Oriented to time, place, and person Mood and affect: No depression, anxiety, or agitation     DATA REVIEWED:   Latest Reference Range & Units 10/21/22 10:22  Cortisol, Plasma ug/dL 44.0  Prolactin ng/mL 34.7  TSH 0.35 - 5.50 uIU/mL 3.79  T4,Free(Direct) 0.60 - 1.60 ng/dL 4.25    Latest Reference Range & Units 10/26/21 10:21  IGF-I, LC/MS 52 - 328 ng/mL 59  Z-Score (Female) -2.0 - 2.0 SD -1.7      Brain MRI 10/20/2021 [place]: Stable appearance of the infundibulum with mild nodular enhancement just below the hypothalamus with small caliber of the remainder. There is no sellar or suprasellar mass.   Brain:  There is no acute infarction or intracranial hemorrhage. There is no intracranial mass, mass effect, or edema. There is no hydrocephalus or extra-axial fluid collection. Ventricles and sulci are normal in size and configuration. No abnormal enhancement.   Vascular: Major vessel flow voids at the skull base are preserved.   Skull and upper cervical spine: Normal marrow signal is preserved.   Sinuses/Orbits: Paranasal sinus mucosal thickening. Orbits are unremarkable.   Other: Patchy right mastoid fluid opacification.   IMPRESSION: Stable small nodular enhancement of the pituitary infundibulum just below the hypothalamus. Remains of unclear etiology or significance but does not represent an aggressive process. No new findings.  ASSESSMENT/PLAN/RECOMMENDATIONS:   Pituitary Microadenoma:  -No clinical evidence of hyper or hypopituitarism -Up-to-date on brain MRI without new changes -TFTs, prolactin, and cortisol are normal -IGF-I ,ACTH have been normal in the past  Follow-up in 1 year    Signed electronically by: Lyndle Herrlich, MD  [facility]  [facility] [address] [place] [place], [place] [address] Phone: [phone] FAX: [phone]   CC: Levin Erp, MD [address] [place] [address] Phone: [phone] Fax: [phone]   Return to Endocrinology clinic as below: Future Appointments  Date Time Provider Department Center  10/21/2022 10:30 AM Aerial Dilley, Konrad Dolores, MD [facility] None  11/03/2022 10:10 AM Drema Dallas, DO [facility] None  11/05/2022 10:15 AM Ferol Luz, MD [facility] None  11/17/2022 10:00 AM [facility] None  11/17/2022 10:30 AM Pollyann Samples, NP [facility] None  11/17/2022 11:15 AM [facility] FLUSH [facility] None  12/14/2022 10:40 AM Arfeen, Phillips Grout, MD [facility] None

## 2022-10-21 ENCOUNTER — Encounter: Payer: Self-pay | Admitting: Internal Medicine

## 2022-10-21 ENCOUNTER — Other Ambulatory Visit: Payer: Self-pay | Admitting: Student

## 2022-10-21 ENCOUNTER — Ambulatory Visit: Payer: 59 | Admitting: Internal Medicine

## 2022-10-21 ENCOUNTER — Encounter (HOSPITAL_COMMUNITY): Payer: Self-pay | Admitting: Internal Medicine

## 2022-10-21 VITALS — BP 124/86 | HR 70 | Ht 59.0 in | Wt 213.0 lb

## 2022-10-21 DIAGNOSIS — D6861 Antiphospholipid syndrome: Secondary | ICD-10-CM

## 2022-10-21 DIAGNOSIS — J302 Other seasonal allergic rhinitis: Secondary | ICD-10-CM | POA: Diagnosis not present

## 2022-10-21 DIAGNOSIS — D352 Benign neoplasm of pituitary gland: Secondary | ICD-10-CM

## 2022-10-21 LAB — T4, FREE: Free T4: 0.63 ng/dL (ref 0.60–1.60)

## 2022-10-21 LAB — CORTISOL: Cortisol, Plasma: 12.4 ug/dL

## 2022-10-21 LAB — TSH: TSH: 3.79 u[IU]/mL (ref 0.35–5.50)

## 2022-10-21 NOTE — Progress Notes (Signed)
VIAL EXP 10-21-23

## 2022-10-22 ENCOUNTER — Ambulatory Visit: Payer: Self-pay

## 2022-10-27 LAB — ACTH: C206 ACTH: 34 pg/mL (ref 6–50)

## 2022-10-27 LAB — PROLACTIN: Prolactin: 10.7 ng/mL

## 2022-10-28 ENCOUNTER — Ambulatory Visit (INDEPENDENT_AMBULATORY_CARE_PROVIDER_SITE_OTHER): Payer: 59

## 2022-10-28 DIAGNOSIS — J309 Allergic rhinitis, unspecified: Secondary | ICD-10-CM | POA: Diagnosis not present

## 2022-11-01 NOTE — Progress Notes (Unsigned)
NEUROLOGY FOLLOW UP OFFICE NOTE  Melissa Gaines [id number]  Assessment/Plan:   Migraine without aura, without status migrainosus, not intractable Left sided occipital neuralgia Pituitary microadenoma  Migraine prevention:  Aimovig 140mg   Migraine rescue:  Ubrelvy 100mg  Limit use of pain relievers to no more than 2 days out of week to prevent risk of rebound or medication-overuse headache. Keep headache diary Follow up 12 months.   Subjective:  Melissa Gaines is a 52 year old woman with PTSD, anxiety, chronic dizziness, migraines, SLE and antiphospholipid antibody syndrome with history of PE on chronic anticoagulation who follows up for migraines.   UPDATE: *** Intensity:  usually mild, sometimes moderate Duration:  With Ubrelvy, lasted 60 to 90 minutes.   Frequency:  2-3 days a month.  Pituitary microadenoma followed by endocrinology.  Repeat MRI of brain with and without contrast on 11/19/2021 personally reviewed showed stable small nodular enhancement of the pituitary infundibulum just below the hypothalamus.  Labs normal.    Current NSAIDS:  Tylenol, naproxen (most days a week) Current analgesics:  None Current triptans:  none Current ergotamine:  none Current anti-emetic:  none Current muscle relaxants:  none Current anti-anxiolytic:  hydroxyzine Current sleep aide:  none Current Antihypertensive medications:  metoprolol Current Antidepressant medications:  Paxil 40mg  Current Anticonvulsant medications: gabapentin 300mg  QHS Current anti-CGRP:  Aimovig 140mg , Ubrelvy 100mg  Current Vitamins/Herbal/Supplements:  D, folic acid, B12 Current Antihistamines/Decongestants:  none Other therapy:  Icy-cold  Hormone/birth control:  none Other medications:  Eliquis, methotrexate   Caffeine:  No coffee.  Pepsi daily. Alcohol:  no Smoker:  no Diet:  4 glasses of water daily.  Skips meals Exercise:  no Depression:  yes; Anxiety:  yes Other pain:   Joint pain, back pain, lumbar radicular pain Sleep hygiene:  varies   HISTORY:  Onset:  Mild migraines in 1990s but worse since birth of son in 2010 Location:  Left temple radiating down to back of head Quality:  pounding, shocks Initial Intensity:  Severe.  She denies new headache, thunderclap headache Aura:  no Premonitory Phase:  no Postdrome:  no Associated symptoms:  Photophobia, phonophobia, dizziness, blurred vision, stuttering speech. If severe, she has memory deficits such as difficulty spelling her name or remembering her social security number.  She denies associated nausea, vomiting,  unilateral numbness or weakness. Initial Duration:  Varies.  1 hour to several hours.  Last month, she had one that lasted 5 days. Initial Frequency:  Daily Initial Frequency of abortive medication: ASA and Tylenol daily Triggers:  bright sunlight Relieving factors:  Resting in dark quiet room, cold packs Activity:  aggravates   She reports increased electric shock pain in the left temple radiating to back of head, lasting 1 to hours with photophobia and phonophobia, occurring twice a month.   Past NSAIDS:  ibuprofen, ASA 325mg  Past analgesics:  Extra-strength Tylenol Past abortive triptans:  rizatriptan, sumatriptan tab Past abortive ergotamine:  none Past muscle relaxants:  none Past anti-emetic:  none Past antihypertensive medications:  none Past antidepressant medications:  none Past anticonvulsant medications:  topiramate 100mg  Past anti-CGRP:  none Past vitamins/Herbal/Supplements:  none Past antihistamines/decongestants:  none Other past therapies:  none   Family history of headache:  Mom   Of note, she was told she had a 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  However, she followed up with another neurologist in [place] who performed a repeat brain MRI and was told that it was gone.  MRI of brain with and without contrast on 09/12/2020 showed subtle 4-5 mm  nodularity at base of pituitary infundibulum possibly reflecting current or previous hypophysitis.  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  Otherwise, brain and pituitary gland unremarkable.  PAST MEDICAL HISTORY: Past Medical History:  Diagnosis Date   Anemia    Anxiety    Asthma    Chronic pain    Congenital hip dislocation    GERD (gastroesophageal reflux disease)    Iron deficiency anemia 04/14/2018   Kidney mass 2017   Migraine    Osteoporosis    Pathological dislocation of shoulder joint, bilateral    congential   PTSD (post-traumatic stress disorder)    Sleep apnea    Systemic lupus erythematosus (HCC)    Urticaria     MEDICATIONS: Current Outpatient Medications on File Prior to Visit  Medication Sig Dispense Refill   acetaminophen (TYLENOL) 500 MG tablet Take 500 mg by mouth every 6 (six) hours as needed for headache.     acetaminophen-codeine (TYLENOL #3) 300-30 MG tablet Take 1-2 tablets by mouth every 4 (four) hours as needed for moderate pain.     albuterol (VENTOLIN HFA) 108 (90 Base) MCG/ACT inhaler Inhale 2 puffs into the lungs every 6 (six) hours as needed for wheezing or shortness of breath. 8 g 2   apixaban (ELIQUIS) 5 MG TABS tablet TAKE 1 TABLET BY MOUTH TWICE  DAILY 90 tablet 0   atorvastatin (LIPITOR) 40 MG tablet Take 1 tablet (40 mg total) by mouth daily. 90 tablet 3   azelastine (ASTELIN) 0.1 % nasal spray Place 2 sprays into both nostrils 2 (two) times daily. Use in each nostril as directed 30 mL 12   budesonide-formoterol (SYMBICORT) 160-4.5 MCG/ACT inhaler Inhale 2 puffs into the lungs daily. 1 each 12   cetirizine (ZYRTEC) 10 MG tablet Take 1 tablet (10 mg total) by mouth daily. 30 tablet 11   cyanocobalamin (,VITAMIN B-12,) 1000 MCG/ML injection Inject 1 mL (1,000 mcg total) into the skin every 30 (thirty) days. 5 mL 0   cycloSPORINE (RESTASIS) 0.05 % ophthalmic emulsion Place 1 drop into both eyes 2 (two) times  daily. 0.4 mL 0   EPINEPHrine 0.3 mg/0.3 mL IJ SOAJ injection INJECT INTRAMUSCULARLY 1 PEN AS  NEEDED FOR ALLERGIC RESPONSE AS  DIRECTED BY MD. SEEK MEDICAL  HELP AFTER USE. 4 each 1   Erenumab-aooe (AIMOVIG) 140 MG/ML SOAJ Inject 140 mg into the skin every 30 (thirty) days. 1.12 mL 11   famotidine (PEPCID) 20 MG tablet Take 1 tablet (20 mg total) by mouth 2 (two) times daily. 30 tablet 5   ferrous sulfate 325 (65 FE) MG tablet Take 1 tablet (325 mg total) by mouth 3 (three) times daily with meals. 90 tablet 1   FLUoxetine (PROZAC) 20 MG capsule Take 1 capsule (20 mg total) by mouth daily. 90 capsule 0   fluticasone (FLONASE) 50 MCG/ACT nasal spray Place 2 sprays into both nostrils daily. 16 g 6   gabapentin (NEURONTIN) 300 MG capsule Take 1 capsule (300 mg total) by mouth at bedtime. 90 capsule 0   levocetirizine (XYZAL) 5 MG tablet Take 1 tablet (5 mg total) by mouth every evening. 30 tablet 5   Olopatadine HCl (PATADAY) 0.2 % SOLN Place 1 drop into both eyes 1 day or 1 dose. 2.5 mL 5   pantoprazole (PROTONIX) 40 MG tablet Take 1 tablet (40 mg total) by mouth daily. 30 tablet 5  PROAIR RESPICLICK 108 (90 Base) MCG/ACT AEPB USE 2 INHALATIONS BY MOUTH INTO  THE LUNGS EVERY 4 HOURS AS  NEEDED. 3 each 6   SYRINGE/NEEDLE, DISP, 1 ML 25G X 5/8" 1 ML MISC Match with B12 3 each 3   traZODone (DESYREL) 100 MG tablet Take 1 tablet (100 mg total) by mouth at bedtime. 90 tablet 0   Ubrogepant (UBRELVY) 100 MG TABS Take 1 tablet by mouth as needed (May repeat in 2 hours.  Maximum 2 tablets in 24 hours). 10 tablet 11   Vitamin D, Ergocalciferol, (DRISDOL) 50000 units CAPS capsule Take 1 capsule (50,000 Units total) by mouth every 7 (seven) days. 8 capsule 0   No current facility-administered medications on file prior to visit.    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 Throat closes and short of breath.    FAMILY HISTORY: Family History  Problem  Relation Age of Onset   Asthma Mother    Liver cancer Mother    Heart Problems Mother        heart attack and heart failure   Kidney Stones Mother    Osteoporosis Mother    Stroke Mother    Depression Mother    Anxiety disorder Mother    Kidney Stones Father    Hernia Father    Kidney disease Father    Depression Sister    Anxiety disorder Sister    Asthma Sister    Cancer Maternal Grandmother    Cancer Paternal Grandmother    Cancer Other       Objective:  *** General: No acute distress.  Patient appears well-groomed.   Head:  Normocephalic/atraumatic Neck:  Supple.  No paraspinal tenderness.  Full range of motion. Heart:  Regular rate and rhythm. Neuro:  Alert and oriented.  Speech fluent and not dysarthric.  Language intact.  CN II-XII intact.  Bulk and tone normal.  Muscle strength 5/5 throughout.  Deep tendon reflexes 2+ throughout.  Gait normal.  Romberg negative.    Shon Millet, DO  CC: Shirlean Mylar, MD

## 2022-11-03 ENCOUNTER — Ambulatory Visit (INDEPENDENT_AMBULATORY_CARE_PROVIDER_SITE_OTHER): Payer: 59 | Admitting: Neurology

## 2022-11-03 ENCOUNTER — Encounter: Payer: Self-pay | Admitting: Neurology

## 2022-11-03 VITALS — BP 112/73 | HR 103 | Ht <= 58 in | Wt 212.3 lb

## 2022-11-03 DIAGNOSIS — G43009 Migraine without aura, not intractable, without status migrainosus: Secondary | ICD-10-CM

## 2022-11-03 DIAGNOSIS — M5481 Occipital neuralgia: Secondary | ICD-10-CM

## 2022-11-03 DIAGNOSIS — M542 Cervicalgia: Secondary | ICD-10-CM | POA: Diagnosis not present

## 2022-11-03 MED ORDER — UBRELVY 100 MG PO TABS: 1.0000 | ORAL_TABLET | ORAL | 11 refills | Status: DC | PRN

## 2022-11-03 MED ORDER — AIMOVIG 140 MG/ML ~~LOC~~ SOAJ: 140.0000 mg | SUBCUTANEOUS | 11 refills | Status: DC

## 2022-11-03 MED ORDER — PREDNISONE 10 MG PO TABS: ORAL_TABLET | ORAL | 0 refills | Status: DC

## 2022-11-03 NOTE — Patient Instructions (Signed)
Take prednisone taper to relieve neck pain.  Also refer to physical therapy for neck pain Continue Aimovig and Ubrelvy Follow up 6 months.

## 2022-11-05 ENCOUNTER — Ambulatory Visit: Payer: 59 | Admitting: Internal Medicine

## 2022-11-05 ENCOUNTER — Other Ambulatory Visit: Payer: Self-pay

## 2022-11-05 ENCOUNTER — Encounter: Payer: Self-pay | Admitting: Internal Medicine

## 2022-11-05 ENCOUNTER — Ambulatory Visit: Payer: Self-pay | Admitting: *Deleted

## 2022-11-05 VITALS — BP 110/88 | HR 78 | Temp 97.8°F | Resp 16 | Ht <= 58 in | Wt 210.7 lb

## 2022-11-05 DIAGNOSIS — K219 Gastro-esophageal reflux disease without esophagitis: Secondary | ICD-10-CM

## 2022-11-05 DIAGNOSIS — H1045 Other chronic allergic conjunctivitis: Secondary | ICD-10-CM | POA: Diagnosis not present

## 2022-11-05 DIAGNOSIS — J4540 Moderate persistent asthma, uncomplicated: Secondary | ICD-10-CM

## 2022-11-05 DIAGNOSIS — T7800XD Anaphylactic reaction due to unspecified food, subsequent encounter: Secondary | ICD-10-CM | POA: Diagnosis not present

## 2022-11-05 DIAGNOSIS — J309 Allergic rhinitis, unspecified: Secondary | ICD-10-CM

## 2022-11-05 DIAGNOSIS — J3089 Other allergic rhinitis: Secondary | ICD-10-CM

## 2022-11-05 DIAGNOSIS — T7800XA Anaphylactic reaction due to unspecified food, initial encounter: Secondary | ICD-10-CM

## 2022-11-05 MED ORDER — OLOPATADINE HCL 0.2 % OP SOLN: 1.0000 [drp] | OPHTHALMIC | 5 refills | Status: DC

## 2022-11-05 MED ORDER — OLOPATADINE HCL 0.2 % OP SOLN: 1.0000 [drp] | Freq: Two times a day (BID) | OPHTHALMIC | 5 refills | Status: DC | PRN

## 2022-11-05 NOTE — Patient Instructions (Addendum)
Perennial Rhinitis: somewhat improved  - Continue with: Xyzal (levocetirizine) 5mg  tablet once daily and Flonase (fluticasone) two sprays per nostril daily,  Astelin (azelastine) 2 sprays per nostril 1-2 times daily as needed  Start  pataday eye drops: 1 drop per eye twice a day as needed  - You can use an extra dose of the antihistamine, if needed, for breakthrough symptoms.  - Consider nasal saline rinses 1-2 times daily to remove allergens from the nasal cavities as well as help with mucous clearance (this is especially helpful to do before the nasal sprays are given) -Continue allergy injections and carry EpiPen on allergy injection today   Food allergy:  - please strictly avoid seafood given history of reactions.  - for SKIN only reaction, okay to take Benadryl 1 capsules every 6 hours - for SKIN + ANY additional symptoms, OR IF concern for LIFE THREATENING reaction = Epipen Autoinjector EpiPen 0.3 mg. - If using Epinephrine autoinjector, call 911 - A food allergy action plan has been provided and discussed. - Medic Alert identification is recommended.  Moderate Persistent Asthma: well  controlled - Breathing test today showed: looked great!  PLAN:  - Spacer use reviewed. - Daily controller medication(s): Symbicort 160/4.58mcg two puffs once daily - Prior to physical activity: albuterol 2 puffs 10-15 minutes before physical activity. - Rescue medications: albuterol 4 puffs every 4-6 hours as needed - Changes during respiratory infections or worsening symptoms: Increase Symbicort to 3 puffs twice daily for TWO WEEKS. - Asthma control goals:  * Full participation in all desired activities (may need albuterol before activity) * Albuterol use two time or less a week on average (not counting use with activity) * Cough interfering with sleep two time or less a month * Oral steroids no more than once a year * No hospitalizations    GERD  - Continue pantoprazole 40mg  daily (take 30  minutes before you eat)  - Continue famotidine 20mg  at night   Follow up: in 6 months   Thank you so much for letting me partake in your care today.  Don't hesitate to reach out if you have any additional concerns!  Ferol Luz, MD  [facility] [place], [place]

## 2022-11-05 NOTE — Progress Notes (Signed)
Follow Up Note  RE: Melissa Gaines MRN: [id number] DOB: [birth year] Date of Office Visit: 11/05/2022  Referring provider: Billey Co, MD Primary care provider: Levin Erp, MD  Chief Complaint: Allergic Rhinitis  (Not sure if she is allergic to cat hair )  History of Present Illness: I had the pleasure of seeing Melissa Gaines for a follow up visit at the [facility] on 11/05/2022. She is a 52 y.o. female, who is being followed for asthma, gerd, food allergy allergic rhinitis on AIT, migraines . Her previous allergy office visit was on 08/06/22 with Dr. Marlynn Perking. Today is a regular follow up visit.  History obtained from patient, chart review.  Asthma is well controlled on symbicort 2 puffs twice daily  Denies any cough, wheeze, dyspnea.  Denies any ED/OCS/UC/ABX since last visit.  Has not needed albuterol   She reports increased itchy watery eyes with cat exposure. Does not have eye drops.  Currently on on AIT to mold.  Denies any SR/LR.  On benadryl and levocetrizine, flonase 1 SEN daily., Astelin 1 SEN daily,  She is interested allegry drops    GERD has improved, will have nighttime symptoms.    She continues to avoid all seafood. No accidental ingestions or reacitons since last visit. She is not interested in food challenge.  She does have an epipen.   Pertinent History/Diagnostics:  - Asthma: moderate persistent, dx'd after 2nd pregnancy, triggered by rhinitis, exercise, URI   - no hospitalizations or intubations   - normal spirometry (09/25/21): ratio 98%, 2.02L, 97% FEV1 (pre), - Allergic Rhinitis:   - SPT environmental panel (09/26/22): Mold   - AIT started 10/15/21 Vial 1 (mold)  - Food Allergy (salmon and tilapia)  - Hx of reaction:  urticaria, throat tightness, facial angioedema   - SPT select foods (09/26/22): negative,   - sIgE (09/26/22) negative   - offered food challenge, but patient declined  - GERD:  persistent   - switched from omeprazole to pantoprazole and famotidine 08/06/22    Assessment and Plan: [name] is a 52 y.o. female with: Moderate persistent asthma without complication - Plan: Spirometry with Graph  Allergy with anaphylaxis due to food  Gastroesophageal reflux disease without esophagitis  Other allergic rhinitis  Other chronic allergic conjunctivitis of both eyes   Plan: Patient Instructions  Perennial Rhinitis: somewhat improved  - Continue with: Xyzal (levocetirizine) 5mg  tablet once daily and Flonase (fluticasone) two sprays per nostril daily,  Astelin (azelastine) 2 sprays per nostril 1-2 times daily as needed  Start  pataday eye drops: 1 drop per eye twice a day as needed  - You can use an extra dose of the antihistamine, if needed, for breakthrough symptoms.  - Consider nasal saline rinses 1-2 times daily to remove allergens from the nasal cavities as well as help with mucous clearance (this is especially helpful to do before the nasal sprays are given) -Continue allergy injections and carry EpiPen on allergy injection today   Food allergy:  - please strictly avoid seafood given history of reactions.  - for SKIN only reaction, okay to take Benadryl 1 capsules every 6 hours - for SKIN + ANY additional symptoms, OR IF concern for LIFE THREATENING reaction = Epipen Autoinjector EpiPen 0.3 mg. - If using Epinephrine autoinjector, call 911 - A food allergy action plan has been provided and discussed. - Medic Alert identification is recommended.  Moderate Persistent Asthma: well  controlled - Breathing test  today showed: looked great!  PLAN:  - Spacer use reviewed. - Daily controller medication(s): Symbicort 160/4.22mcg two puffs once daily - Prior to physical activity: albuterol 2 puffs 10-15 minutes before physical activity. - Rescue medications: albuterol 4 puffs every 4-6 hours as needed - Changes during respiratory infections or worsening symptoms:  Increase Symbicort to 3 puffs twice daily for TWO WEEKS. - Asthma control goals:  * Full participation in all desired activities (may need albuterol before activity) * Albuterol use two time or less a week on average (not counting use with activity) * Cough interfering with sleep two time or less a month * Oral steroids no more than once a year * No hospitalizations    GERD  - Continue pantoprazole 40mg  daily (take 30 minutes before you eat)  - Continue famotidine 20mg  at night   Follow up: in 6 months   Thank you so much for letting me partake in your care today.  Don't hesitate to reach out if you have any additional concerns!  Ferol Luz, MD  [facility] [place] [place]   Meds ordered this encounter  Medications   Olopatadine HCl (PATADAY) 0.2 % SOLN    Sig: Place 1 drop into both eyes 2 (two) times daily as needed.    Dispense:  2.5 mL    Refill:  5    Run as RX NDC   Olopatadine HCl (PATADAY) 0.2 % SOLN    Sig: Place 1 drop into both eyes 1 day or 1 dose.    Dispense:  2.5 mL    Refill:  5    Lab Orders  No laboratory test(s) ordered today    Diagnostics: Spirometry:  Tracings reviewed. Her effort: Good reproducible efforts. FVC: 2.76L FEV1: 2.26L, 109% predicted FEV1/FVC ratio: 82% Interpretation: Spirometry consistent with normal pattern.  Please see scanned spirometry results for details.     Medication List:  Current Outpatient Medications  Medication Sig Dispense Refill   acetaminophen (TYLENOL) 500 MG tablet Take 500 mg by mouth every 6 (six) hours as needed for headache.     acetaminophen-codeine (TYLENOL #3) 300-30 MG tablet Take 1-2 tablets by mouth every 4 (four) hours as needed for moderate pain.     albuterol (VENTOLIN HFA) 108 (90 Base) MCG/ACT inhaler Inhale 2 puffs into the lungs every 6 (six) hours as needed for wheezing or shortness of breath. 8 g 2   apixaban (ELIQUIS) 5 MG TABS tablet TAKE 1 TABLET BY MOUTH TWICE   DAILY 90 tablet 0   atorvastatin (LIPITOR) 40 MG tablet Take 1 tablet (40 mg total) by mouth daily. 90 tablet 3   azelastine (ASTELIN) 0.1 % nasal spray Place 2 sprays into both nostrils 2 (two) times daily. Use in each nostril as directed 30 mL 12   budesonide-formoterol (SYMBICORT) 160-4.5 MCG/ACT inhaler Inhale 2 puffs into the lungs daily. 1 each 12   cetirizine (ZYRTEC) 10 MG tablet Take 1 tablet (10 mg total) by mouth daily. 30 tablet 11   cyanocobalamin (,VITAMIN B-12,) 1000 MCG/ML injection Inject 1 mL (1,000 mcg total) into the skin every 30 (thirty) days. 5 mL 0   cycloSPORINE (RESTASIS) 0.05 % ophthalmic emulsion Place 1 drop into both eyes 2 (two) times daily. 0.4 mL 0   EPINEPHrine 0.3 mg/0.3 mL IJ SOAJ injection INJECT INTRAMUSCULARLY 1 PEN AS  NEEDED FOR ALLERGIC RESPONSE AS  DIRECTED BY MD. Assunta Found MEDICAL  HELP AFTER USE. 4 each 1   Erenumab-aooe (AIMOVIG) 140  MG/ML SOAJ Inject 140 mg into the skin every 30 (thirty) days. 1.12 mL 11   famotidine (PEPCID) 20 MG tablet Take 1 tablet (20 mg total) by mouth 2 (two) times daily. 30 tablet 5   ferrous sulfate 325 (65 FE) MG tablet Take 1 tablet (325 mg total) by mouth 3 (three) times daily with meals. 90 tablet 1   FLUoxetine (PROZAC) 20 MG capsule Take 1 capsule (20 mg total) by mouth daily. 90 capsule 0   fluticasone (FLONASE) 50 MCG/ACT nasal spray Place 2 sprays into both nostrils daily. 16 g 6   gabapentin (NEURONTIN) 300 MG capsule Take 1 capsule (300 mg total) by mouth at bedtime. 90 capsule 0   levocetirizine (XYZAL) 5 MG tablet Take 1 tablet (5 mg total) by mouth every evening. 30 tablet 5   Olopatadine HCl (PATADAY) 0.2 % SOLN Place 1 drop into both eyes 2 (two) times daily as needed. 2.5 mL 5   pantoprazole (PROTONIX) 40 MG tablet Take 1 tablet (40 mg total) by mouth daily. 30 tablet 5   predniSONE (DELTASONE) 10 MG tablet Take 60mg  on day 1, then 50mg  on day 2, then 40mg  on day 3, then 30mg  on day 4, then 20mg  on day 5, then 10mg   on day 6, then STOP 21 tablet 0   SYRINGE/NEEDLE, DISP, 1 ML 25G X 5/8" 1 ML MISC Match with B12 3 each 3   traZODone (DESYREL) 100 MG tablet Take 1 tablet (100 mg total) by mouth at bedtime. 90 tablet 0   Ubrogepant (UBRELVY) 100 MG TABS Take 1 tablet (100 mg total) by mouth as needed (May repeat in 2 hours.  Maximum 2 tablets in 24 hours). 10 tablet 11   Vitamin D, Ergocalciferol, (DRISDOL) 50000 units CAPS capsule Take 1 capsule (50,000 Units total) by mouth every 7 (seven) days. 8 capsule 0   Olopatadine HCl (PATADAY) 0.2 % SOLN Place 1 drop into both eyes 1 day or 1 dose. 2.5 mL 5   No current facility-administered medications for this visit.   Allergies: Allergies  Allergen Reactions   Fish Allergy 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 Throat closes and short of breath.   I reviewed her past medical history, social history, family history, and environmental history and no significant changes have been reported from her previous visit.  ROS: All others negative except as noted per HPI.   Objective: BP 110/88   Pulse 78   Temp 97.8 F (36.6 C)   Resp 16   Ht 4\' 10"  (1.473 m)   Wt 210 lb 11.2 oz (95.6 kg)   SpO2 95%   BMI 44.04 kg/m  Body mass index is 44.04 kg/m. General Appearance:  Alert, cooperative, no distress, appears stated age  Head:  Normocephalic, without obvious abnormality, atraumatic  Eyes:  Conjunctiva slightly injected EOM's intact  Nose: Nares normal, hypertrophic turbinates, normal mucosa, no visible anterior polyps, and septum midline  Throat: Lips, tongue normal; teeth and gums normal, normal posterior oropharynx  Neck: Supple, symmetrical  Lungs:   clear to auscultation bilaterally, Respirations unlabored, no coughing  Heart:  regular rate and rhythm and no murmur, Appears well perfused  Extremities: No edema  Skin: Skin color, texture, turgor normal, no rashes or lesions on visualized portions of skin  Neurologic: No gross deficits   Previous notes  and tests were reviewed. The plan was reviewed with the patient/family, and all questions/concerned were addressed.  It was my pleasure to see Aminat  today and participate in her care. Please feel free to contact me with any questions or concerns.  Sincerely,  Ferol Luz, MD  Allergy & Immunology  [facility] Office: [phone]

## 2022-11-10 ENCOUNTER — Inpatient Hospital Stay: Payer: 59

## 2022-11-10 ENCOUNTER — Inpatient Hospital Stay: Payer: 59 | Admitting: Nurse Practitioner

## 2022-11-12 ENCOUNTER — Other Ambulatory Visit: Payer: Self-pay

## 2022-11-12 ENCOUNTER — Ambulatory Visit (INDEPENDENT_AMBULATORY_CARE_PROVIDER_SITE_OTHER): Payer: 59 | Admitting: *Deleted

## 2022-11-12 DIAGNOSIS — J309 Allergic rhinitis, unspecified: Secondary | ICD-10-CM | POA: Diagnosis not present

## 2022-11-12 MED ORDER — OLOPATADINE HCL 0.2 % OP SOLN: 1.0000 [drp] | Freq: Every day | OPHTHALMIC | 5 refills | Status: DC | PRN

## 2022-11-14 NOTE — Progress Notes (Unsigned)
Patient Care Team: Levin Erp, MD as PCP - General (Family Medicine) Jodelle Red, MD as PCP - Cardiology (Cardiology) Drema Dallas, DO as Consulting Physician (Neurology) Malachy Mood, MD as Consulting Physician (Hematology and Oncology) Cleotis Nipper, MD as Consulting Physician (Psychiatry)   CHIEF COMPLAINT: Follow up anemia  Diagnosis list : 1. Anemia - on 12/19/2017 ferritin 6 serum iron 19 TIBC 478, 4% transferrin saturation Hgb 12.2 consistent with iron deficiency, began oral iron; ferritin on 04/14/18 <4, given periodically, last 06/2020    2. B12 on 10/13/27 low 164; She reports history of B12 deficiency and injections while living in [place], none in 3 years prior to relocating to [facility] H/o gastric bypass surgery in 2014   3. 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 G2P2   4. SLE - presented with butterfly rash in 2005, per historical note she was treated with plaquenil and prednisone but changed to plaquenil and methotrexate due to drug AE's. Positive ANA titer on 11/15/2016; lupus anticoagulant positive on 07/08/17, 07/22/17, and 05/01/18. The antiphospholipid antibody IgG 46.1 (<15); antiphospholipid antibody IgM 13.4 (<15); antiphospholipid antibody IgA 21 (<15); beta-2 glycoprotein 1 antibody IgM 19.4 (<15); beta-2 glycoprotein antibody IgG G 50.2 (<15). Followed by Rheumatologist Dr. Sharmon Revere at [facility]   5. Hypercoagulation work up: On 12/19/2017 showed slightly elevated protein C activity to 181% (range 73-180%) and elevated factor VIII assay to 223%, elevated beta-2 glycoprotein IgG to 27, and elevated cardiolipin antibodies IgG, IgM, and IgA; otherwise hemoglobinopathy evaluation, Antithrombin III, protein S activity; factor V Leiden, and prothrombin gene were normal   CURRENT THERAPY:  -IV Feraheme 510 mg PRN goal ferritin >50, last given 06/2020 -Oral ferrous sulfate -Apixaban  5 mg BID  -B12 injection 1000 mcg monthly starting 10/2018  INTERVAL HISTORY Ms. Yehuda Budd returns for follow up as scheduled. Last seen by Dr. Mosetta Putt 05/2022   ROS   Past Medical History:  Diagnosis Date   Anemia    Anxiety    Asthma    Chronic pain    Congenital hip dislocation    GERD (gastroesophageal reflux disease)    Iron deficiency anemia 04/14/2018   Kidney mass 2017   Migraine    Osteoporosis    Pathological dislocation of shoulder joint, bilateral    congential   PTSD (post-traumatic stress disorder)    Sleep apnea    Systemic lupus erythematosus (HCC)    Urticaria      Past Surgical History:  Procedure Laterality Date   CESAREAN SECTION  2005   CESAREAN SECTION W/BTL  2010   HIP SURGERY     in childhood for congenital hip dislocation   LAPAROSCOPIC GASTRIC SLEEVE RESECTION  2014   STAPEDECTOMY  11/01/2011   L postauricular stapedectomy with CO2 laser and insertion of 6 x 4.75 mm SMart Piston   TOTAL HIP ARTHROPLASTY     05/2000 R, 11/2000 L     Outpatient Encounter Medications as of 11/17/2022  Medication Sig Note   acetaminophen (TYLENOL) 500 MG tablet Take 500 mg by mouth every 6 (six) hours as needed for headache.    acetaminophen-codeine (TYLENOL #3) 300-30 MG tablet Take 1-2 tablets by mouth every 4 (four) hours as needed for moderate pain.    albuterol (VENTOLIN HFA) 108 (90 Base) MCG/ACT inhaler Inhale 2 puffs into the lungs every 6 (six) hours as needed for wheezing or shortness of breath.  apixaban (ELIQUIS) 5 MG TABS tablet TAKE 1 TABLET BY MOUTH TWICE  DAILY    atorvastatin (LIPITOR) 40 MG tablet Take 1 tablet (40 mg total) by mouth daily.    azelastine (ASTELIN) 0.1 % nasal spray Place 2 sprays into both nostrils 2 (two) times daily. Use in each nostril as directed    budesonide-formoterol (SYMBICORT) 160-4.5 MCG/ACT inhaler Inhale 2 puffs into the lungs daily. 07/21/2021: Using only 1 x per week   cetirizine (ZYRTEC) 10 MG tablet Take 1 tablet (10 mg  total) by mouth daily. 11/03/2022: As needed for allergies   cyanocobalamin (,VITAMIN B-12,) 1000 MCG/ML injection Inject 1 mL (1,000 mcg total) into the skin every 30 (thirty) days.    cycloSPORINE (RESTASIS) 0.05 % ophthalmic emulsion Place 1 drop into both eyes 2 (two) times daily.    EPINEPHrine 0.3 mg/0.3 mL IJ SOAJ injection INJECT INTRAMUSCULARLY 1 PEN AS  NEEDED FOR ALLERGIC RESPONSE AS  DIRECTED BY MD. Assunta Found MEDICAL  HELP AFTER USE.    Erenumab-aooe (AIMOVIG) 140 MG/ML SOAJ Inject 140 mg into the skin every 30 (thirty) days.    famotidine (PEPCID) 20 MG tablet Take 1 tablet (20 mg total) by mouth 2 (two) times daily.    ferrous sulfate 325 (65 FE) MG tablet Take 1 tablet (325 mg total) by mouth 3 (three) times daily with meals.    FLUoxetine (PROZAC) 20 MG capsule Take 1 capsule (20 mg total) by mouth daily.    fluticasone (FLONASE) 50 MCG/ACT nasal spray Place 2 sprays into both nostrils daily.    gabapentin (NEURONTIN) 300 MG capsule Take 1 capsule (300 mg total) by mouth at bedtime.    levocetirizine (XYZAL) 5 MG tablet Take 1 tablet (5 mg total) by mouth every evening.    Olopatadine HCl (PATADAY) 0.2 % SOLN Place 1 drop into both eyes 2 (two) times daily as needed.    Olopatadine HCl (PATADAY) 0.2 % SOLN Place 1 drop into both eyes daily as needed.    pantoprazole (PROTONIX) 40 MG tablet Take 1 tablet (40 mg total) by mouth daily.    predniSONE (DELTASONE) 10 MG tablet Take 60mg  on day 1, then 50mg  on day 2, then 40mg  on day 3, then 30mg  on day 4, then 20mg  on day 5, then 10mg  on day 6, then STOP    SYRINGE/NEEDLE, DISP, 1 ML 25G X 5/8" 1 ML MISC Match with B12    traZODone (DESYREL) 100 MG tablet Take 1 tablet (100 mg total) by mouth at bedtime.    Ubrogepant (UBRELVY) 100 MG TABS Take 1 tablet (100 mg total) by mouth as needed (May repeat in 2 hours.  Maximum 2 tablets in 24 hours).    Vitamin D, Ergocalciferol, (DRISDOL) 50000 units CAPS capsule Take 1 capsule (50,000 Units total) by  mouth every 7 (seven) days.    No facility-administered encounter medications on file as of 11/17/2022.     There were no vitals filed for this visit. There is no height or weight on file to calculate BMI.   PHYSICAL EXAM GENERAL:alert, no distress and comfortable SKIN: no rash  EYES: sclera clear NECK: without mass LYMPH:  no palpable cervical or supraclavicular lymphadenopathy  LUNGS: clear with normal breathing effort HEART: regular rate & rhythm, no lower extremity edema ABDOMEN: abdomen soft, non-tender and normal bowel sounds NEURO: alert & oriented x 3 with fluent speech, no focal motor/sensory deficits Breast exam:  PAC without erythema    CBC    Component Value  Date/Time   WBC 7.0 05/12/2022 0946   WBC 7.0 10/13/2018 1018   RBC 5.08 05/12/2022 0946   HGB 15.1 (H) 05/12/2022 0946   HGB 13.5 11/15/2016 1038   HCT 43.6 05/12/2022 0946   HCT 42.6 11/15/2016 1038   PLT 315 05/12/2022 0946   PLT 253 11/15/2016 1038   MCV 85.8 05/12/2022 0946   MCV 79 11/15/2016 1038   MCH 29.7 05/12/2022 0946   MCHC 34.6 05/12/2022 0946   RDW 13.6 05/12/2022 0946   RDW 21.7 (H) 11/15/2016 1038   LYMPHSABS 2.7 05/12/2022 0946   LYMPHSABS 2.2 11/15/2016 1038   MONOABS 0.4 05/12/2022 0946   EOSABS 0.3 05/12/2022 0946   EOSABS 0.1 11/15/2016 1038   BASOSABS 0.1 05/12/2022 0946   BASOSABS 0.0 11/15/2016 1038     CMP     Component Value Date/Time   NA 135 05/12/2022 0946   NA 142 07/01/2021 1457   K 4.1 05/12/2022 0946   CL 101 05/12/2022 0946   CO2 26 05/12/2022 0946   GLUCOSE 87 05/12/2022 0946   BUN 7 05/12/2022 0946   BUN 8 07/01/2021 1457   CREATININE 0.47 05/12/2022 0946   CALCIUM 9.6 05/12/2022 0946   PROT 8.0 05/12/2022 0946   PROT 7.8 07/01/2021 1457   ALBUMIN 4.3 05/12/2022 0946   ALBUMIN 4.5 07/01/2021 1457   AST 10 (L) 05/12/2022 0946   ALT 10 05/12/2022 0946   ALKPHOS 62 05/12/2022 0946   BILITOT 0.4 05/12/2022 0946   GFRNONAA >60 05/12/2022 0946   GFRAA  >60 10/26/2019 0932     ASSESSMENT & PLAN:52 yo female with    1. 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 She initially responded well, but ferritin on 10/13/18 down to 16 and she became symptomatic with fatigue. Received IV Feraheme x1 on 11/02/2018, given 05/15/2019 and 06/2020 -GI work-up at [facility] 04/23/2021 showed peptic stricture and erosive esophagitis, s/p dilation.  She is on PPI.  Colonoscopy was cut short due to asthma flare and hypoxemia but was unremarkable -Goal ferritin > 50   2. B12 deficiency, secondary to previous gastric bypass surgery - 10/13/18 B12 level decreased to 164 with signs of peripheral neuropathy.  -she began monthly B12 inj in 10/2018, level remains in normal range -continue monthly injections at [facility], her insurance does not cover home administration   3.  Asthma -Uncontrolled lately -She reportedly had an asthma attack during colonoscopy and it was not completed -Now she is afraid to go to sleep due to fear of having another asthma attack while sleeping -I will reach out to her PCP for asthma medication education and follow-up.  She does not have a pulmonologist   4. SLE  - followed by Rheum at [facility] -Stable   5. Antiphospholipid syndrome, history of peripartum PE -continue eliquis, tolerating well without bleeding except irregular menses -No clinical evidence of recurrent thrombosis -Refills per PCP    6. Migraines -followed by Neurology  Dr. 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     All questions were answered. The patient knows to call the clinic with any problems, questions or concerns. No barriers to learning were detected. I spent *** counseling the patient face to face. The total time spent in the appointment was *** and more than 50% was on counseling, review of test results, and coordination of care.   Santiago Glad, NP-C @DATE @

## 2022-11-17 ENCOUNTER — Inpatient Hospital Stay: Payer: 59

## 2022-11-17 ENCOUNTER — Encounter: Payer: Self-pay | Admitting: Nurse Practitioner

## 2022-11-17 ENCOUNTER — Inpatient Hospital Stay: Payer: 59 | Attending: Nurse Practitioner

## 2022-11-17 ENCOUNTER — Inpatient Hospital Stay (HOSPITAL_BASED_OUTPATIENT_CLINIC_OR_DEPARTMENT_OTHER): Payer: 59 | Admitting: Nurse Practitioner

## 2022-11-17 VITALS — BP 100/64 | HR 77 | Temp 97.7°F | Resp 15 | Wt 211.2 lb

## 2022-11-17 DIAGNOSIS — E538 Deficiency of other specified B group vitamins: Secondary | ICD-10-CM | POA: Diagnosis present

## 2022-11-17 DIAGNOSIS — D6861 Antiphospholipid syndrome: Secondary | ICD-10-CM | POA: Insufficient documentation

## 2022-11-17 DIAGNOSIS — Z7901 Long term (current) use of anticoagulants: Secondary | ICD-10-CM | POA: Insufficient documentation

## 2022-11-17 DIAGNOSIS — N920 Excessive and frequent menstruation with regular cycle: Secondary | ICD-10-CM | POA: Insufficient documentation

## 2022-11-17 DIAGNOSIS — D508 Other iron deficiency anemias: Secondary | ICD-10-CM

## 2022-11-17 DIAGNOSIS — D500 Iron deficiency anemia secondary to blood loss (chronic): Secondary | ICD-10-CM | POA: Diagnosis not present

## 2022-11-17 DIAGNOSIS — Z86711 Personal history of pulmonary embolism: Secondary | ICD-10-CM | POA: Diagnosis not present

## 2022-11-17 LAB — CMP (CANCER CENTER ONLY)
ALT: 9 U/L (ref 0–44)
AST: 10 U/L — ABNORMAL LOW (ref 15–41)
Albumin: 4.2 g/dL (ref 3.5–5.0)
Alkaline Phosphatase: 75 U/L (ref 38–126)
Anion gap: 7 (ref 5–15)
BUN: 11 mg/dL (ref 6–20)
CO2: 31 mmol/L (ref 22–32)
Calcium: 9.5 mg/dL (ref 8.9–10.3)
Chloride: 102 mmol/L (ref 98–111)
Creatinine: 0.62 mg/dL (ref 0.44–1.00)
GFR, Estimated: >60 mL/min (ref >60)
Glucose, Bld: 90 mg/dL (ref 70–99)
Potassium: 4.1 mmol/L (ref 3.5–5.1)
Sodium: 140 mmol/L (ref 135–145)
Total Bilirubin: 0.4 mg/dL (ref 0.3–1.2)
Total Protein: 7.8 g/dL (ref 6.5–8.1)

## 2022-11-17 LAB — IRON AND IRON BINDING CAPACITY (CC-WL,HP ONLY)
Iron: 54 ug/dL (ref 28–170)
Saturation Ratios: 13 % (ref 10.4–31.8)
TIBC: 412 ug/dL (ref 250–450)
UIBC: 358 ug/dL (ref 148–442)

## 2022-11-17 LAB — CBC WITH DIFFERENTIAL (CANCER CENTER ONLY)
Abs Immature Granulocytes: 0.02 10*3/uL (ref 0.00–0.07)
Basophils Absolute: 0 10*3/uL (ref 0.0–0.1)
Basophils Relative: 1 %
Eosinophils Absolute: 0.2 10*3/uL (ref 0.0–0.5)
Eosinophils Relative: 3 %
HCT: 42 % (ref 36.0–46.0)
Hemoglobin: 14.2 g/dL (ref 12.0–15.0)
Immature Granulocytes: 0 %
Lymphocytes Relative: 38 %
Lymphs Abs: 2.4 10*3/uL (ref 0.7–4.0)
MCH: 29.2 pg (ref 26.0–34.0)
MCHC: 33.8 g/dL (ref 30.0–36.0)
MCV: 86.2 fL (ref 80.0–100.0)
Monocytes Absolute: 0.3 10*3/uL (ref 0.1–1.0)
Monocytes Relative: 4 %
Neutro Abs: 3.5 10*3/uL (ref 1.7–7.7)
Neutrophils Relative %: 54 %
Platelet Count: 307 10*3/uL (ref 150–400)
RBC: 4.87 MIL/uL (ref 3.87–5.11)
RDW: 13.3 % (ref 11.5–15.5)
WBC Count: 6.4 10*3/uL (ref 4.0–10.5)
nRBC: 0 % (ref 0.0–0.2)

## 2022-11-17 LAB — VITAMIN B12: Vitamin B-12: 490 pg/mL (ref 180–914)

## 2022-11-17 LAB — FERRITIN: Ferritin: 47 ng/mL (ref 11–307)

## 2022-11-17 MED ORDER — CYANOCOBALAMIN 1000 MCG/ML IJ SOLN
1000.0000 ug | Freq: Once | INTRAMUSCULAR | Status: AC
  Administered 2022-11-17: 1000 ug via INTRAMUSCULAR
  Filled 2022-11-17: qty 1

## 2022-11-19 ENCOUNTER — Other Ambulatory Visit (HOSPITAL_COMMUNITY): Payer: Self-pay | Admitting: Psychiatry

## 2022-11-19 ENCOUNTER — Ambulatory Visit (INDEPENDENT_AMBULATORY_CARE_PROVIDER_SITE_OTHER): Payer: 59

## 2022-11-19 DIAGNOSIS — F4310 Post-traumatic stress disorder, unspecified: Secondary | ICD-10-CM

## 2022-11-19 DIAGNOSIS — F331 Major depressive disorder, recurrent, moderate: Secondary | ICD-10-CM

## 2022-11-19 DIAGNOSIS — J309 Allergic rhinitis, unspecified: Secondary | ICD-10-CM | POA: Diagnosis not present

## 2022-11-22 ENCOUNTER — Other Ambulatory Visit: Payer: Self-pay | Admitting: Student

## 2022-11-22 DIAGNOSIS — D6861 Antiphospholipid syndrome: Secondary | ICD-10-CM

## 2022-11-24 NOTE — Therapy (Signed)
OUTPATIENT PHYSICAL THERAPY CERVICAL EVALUATION   Patient Name: Melissa Gaines MRN: [id number] DOB:08-05-1970, 52 y.o., female Today's Date: 11/25/2022  END OF SESSION:  PT End of Session - 11/25/22 0858     Visit Number 1    Number of Visits 12    Date for PT Re-Evaluation 01/06/23    Authorization Type [employer] MCR    PT Start Time 0845    PT Stop Time 0930    PT Time Calculation (min) 45 min    Activity Tolerance Patient tolerated treatment well;Patient limited by pain    Behavior During Therapy [facility] for tasks assessed/performed             Past Medical History:  Diagnosis Date   Anemia    Anxiety    Asthma    Chronic pain    Congenital hip dislocation    GERD (gastroesophageal reflux disease)    Iron deficiency anemia 04/14/2018   Kidney mass 2017   Migraine    Osteoporosis    Pathological dislocation of shoulder joint, bilateral    congential   PTSD (post-traumatic stress disorder)    Sleep apnea    Systemic lupus erythematosus (HCC)    Urticaria    Past Surgical History:  Procedure Laterality Date   CESAREAN SECTION  2005   CESAREAN SECTION W/BTL  2010   HIP SURGERY     in childhood for congenital hip dislocation   LAPAROSCOPIC GASTRIC SLEEVE RESECTION  2014   STAPEDECTOMY  11/01/2011   L postauricular stapedectomy with CO2 laser and insertion of 6 x 4.75 mm SMart Piston   TOTAL HIP ARTHROPLASTY     05/2000 R, 11/2000 L   Patient Active Problem List   Diagnosis Date Noted   B12 deficiency 05/11/2022   Hyperlipidemia 05/07/2022   Prediabetes 05/07/2022   Environmental and seasonal allergies 08/03/2021   History of colonoscopy 07/31/2021   Asthma 07/01/2021   Gastroesophageal reflux disease 08/19/2020   Tinnitus aurium, 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   Lumbar back pain 01/25/2020   Left hip pain 06/26/2019   Muscle strain of lower extremity, left, initial encounter 06/26/2019   Healthcare maintenance 06/26/2019   Iron deficiency anemia 04/14/2018    Hematuria, gross 03/29/2018   Iron deficiency anemia due to chronic blood loss 03/29/2018   Long term current use of anticoagulant 03/29/2018   Antiphospholipid antibody syndrome (HCC) 01/25/2018   Morbid obesity (HCC) 07/26/2017   Systemic lupus erythematosus (HCC) 07/08/2017   Hx pulmonary embolism 07/08/2017   Chronic pain of right knee 05/23/2017   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/21/2016   Vitamin D deficiency 12/16/2016   Anemia    Migraine     PCP: Levin Erp, MD   REFERRING PROVIDER: Drema Dallas, DO   REFERRING DIAG: M54.2 (ICD-10-CM) - Cervicalgia   THERAPY DIAG:  Cervicalgia  Muscle weakness (generalized)  Chronic left shoulder pain  Abnormal posture  Rationale for Evaluation and Treatment: Rehabilitation  ONSET DATE: August 2024   SUBJECTIVE:  SUBJECTIVE STATEMENT: I was helping a friend out moving and later I noticed when I woke up the next day, I had neck pain. I thought I might have slept the wrong way. Stated on my left side of my neck and now it is down the middle. When I turn to the R and the L , I hear my neck clicking. When I bend my neck all the way down , I feel dizzy.  I don't drive because of disability with dislocated hips since childbirth.  My shoulders give me problems as well.   I also have headaches daily but mild.  Bright lights are hard for me, I have tingling down both arms and both legs910   Hand dominance: Right  PERTINENT HISTORY:  Lupus, congenital hip dislocation, migraines,Pathological dislocation of shld joint bil, PTSD, Sleep apnea GERD, Asthma, Osteoporois  PAIN:  Are you having pain? Yes: NPRS scale: 9/10 at worst 10/10 Pain location: BIL neck pain L > R and down the middle Pain description: sharp pain,,pounding Aggravating factors:  when I have to turn my head, sweeping and mopping, washing the tub Relieving factors: laying down  Sleeping is hard because I have pain turning my neck PRECAUTIONS: None  RED FLAGS: None     WEIGHT BEARING RESTRICTIONS: No  FALLS:  Has patient fallen in last 6 months? No  LIVING ENVIRONMENT: Lives with: lives with their family Lives in: House/apartment Stairs: Yes: External: 1 steps; none Has following equipment at home: None  OCCUPATION: On disability  PLOF: Independent  PATIENT GOALS: My neck to feel normal and be able to turn  NEXT MD VISIT: in September late  OBJECTIVE:   DIAGNOSTIC FINDINGS:  No recent  PATIENT SURVEYS:  FOTO 51 % predicted 60%  COGNITION: Overall cognitive status: Within functional limits for tasks assessed  SENSATION: Pt reports bil tingling in bil UE and LE  POSTURE: rounded shoulders, forward head, and flexed trunk  obesity  PALPATION: TTP over bil cervical paraspinals and UT  Left > Right   CERVICAL ROM:   Active ROM A/PROM (deg) eval  Flexion 30  Extension 38  Right lateral flexion 22  Left lateral flexion 19  Right rotation 35  Left rotation 40   (Blank rows = not tested)  UPPER EXTREMITY ROM:  Active ROM Right eval Left eval  Shoulder flexion 105 130  Shoulder extension    Shoulder abduction 105 110  Shoulder adduction    Shoulder extension    Shoulder internal rotation    Shoulder external rotation    Elbow flexion    Elbow extension    Wrist flexion    Wrist extension    Wrist ulnar deviation    Wrist radial deviation    Wrist pronation    Wrist supination     (Blank rows = not tested)  UPPER EXTREMITY MMT:  Pt with global tightness.  MMT Right eval Left eval  Shoulder flexion 3+ 4  Shoulder extension    Shoulder abduction 3+ 4  Shoulder adduction    Shoulder extension    Shoulder internal rotation    Shoulder external rotation    Middle trapezius 4- 4-  Lower trapezius 3+ 3+  Elbow flexion     Elbow extension    Wrist flexion    Wrist extension    Wrist ulnar deviation    Wrist radial deviation    Wrist pronation    Wrist supination    Grip strength 30 lb 32 lb   (  Blank rows = not tested)  CERVICAL SPECIAL TESTS:  DNF test  can only hold in supone 5 sec.  Pt with increased sensitivity with palpation at subocciptals and bil cervical paraspinals  FUNCTIONAL TESTS:  5 times sit to stand: 14.98   TODAY'S TREATMENT:                                                                                                                              DATE: 11-25-22   PATIENT EDUCATION:  Education details: POC  Explanation of findings,issue HEP Person educated: Patient Education method: Explanation, Demonstration, Tactile cues, Verbal cues, and Handouts Education comprehension: verbalized understanding, returned demonstration, verbal cues required, tactile cues required, and needs further education  HOME EXERCISE PROGRAM: Access Code: [email or web address] URL: [email or web address] Date: 11/25/2022 Prepared by: Garen Lah  Exercises - Supine Deep Neck Flexor Training  - 2-3 x daily - 7 x weekly - 10 reps - 3 hold - Seated Cervical Retraction Protraction AROM  - 2-3 x daily - 7 x weekly - 1 sets - 10 reps - Seated Gentle Upper Trapezius Stretch  - 1 x daily - 7 x weekly - 1 sets - 3 reps - 30 hold - Gentle Levator Scapulae Stretch  - 1 x daily - 7 x weekly - 3 sets - 3 reps - 30 hold - Shoulder External Rotation and Scapular Retraction with Resistance  - 1-2 x daily - 7 x weekly - 3 sets - 10 reps - 3-5 sec hold  ASSESSMENT:  CLINICAL IMPRESSION: Patient is a 52 y.o. female  who was seen today for physical therapy evaluation and treatment for cervicalgia and and musculoskeletal strain after helping a friend move a month ago.  Pt complains of pain and difficulty turning her neck during day for sweeping and vacuuming and while sleeping at night.  Pt also has SLE and  congenital hip dislocations/shoulder dislocations and subsequent muscle weakness.  Pt will benefit from skilled PT to address impairments that keep her from daily activities. Pt currently is not participating in any regular exercise. Will progress toward completion of goals.  OBJECTIVE IMPAIRMENTS: decreased activity tolerance, decreased ROM, decreased strength, dizziness, impaired sensation, impaired UE functional use, improper body mechanics, postural dysfunction, obesity, and pain.   ACTIVITY LIMITATIONS: carrying, lifting, sleeping, and reach over head  PARTICIPATION LIMITATIONS: meal prep, cleaning, and laundry  PERSONAL FACTORS: Lupus, congenital hip dislocation, migraines,Pathological dislocation of shld joint bil, PTSD, Sleep apnea GERD, Asthma, Osteoporois are also affecting patient's functional outcome.   REHAB POTENTIAL: Good for cervical  Fair for shoulders and hips due to congenital issues and dislocations  CLINICAL DECISION MAKING: Evolving/moderate complexity  EVALUATION COMPLEXITY: Moderate   GOALS: Goals reviewed with patient? Yes  SHORT TERM GOALS: Target date: 11-16-22  Pt will be independent with initial HEP Baseline:  Goal status: INITIAL  2.  Pt pain  will reduce to at least 6/10 from 9/10 with turning neck Baseline: See AROM chart  Goal status: INITIAL  3.  Demonstrate understanding of proper sitting posture, body mechanics, work ergonomics, and be more conscious of position and posture throughout the day.  Baseline:  Goal status: INITIAL   Goal status: INITIAL  LONG TERM GOALS: Target date: 01-06-23  Pt will be independent with advanced HEP Baseline: no knowledge Goal status: INITIAL  2.  Pt will be able to turn neck to 45 degrees minimally to improve environmental scanning Baseline: See AROM chart Goal status: INITIAL  3.  Pt will be able to do household chores with 50% greater ease Baseline: unable to clean tub and perform household tasks of  sweeping and vacuuming Goal status: INITIAL  4.  Pt will be able to improve DNF supine test by at least 25 seconds to show increase in neck strength Baseline: unable to hold in supie Goal status: INITIAL  5.  Pt will report 3/10 or less with turning head at night in order to have 4 or more hours of restful sleep Baseline: wakes every 2 hours Goal status: INITIAL  6.  FOTO will improve from 51%   to  60%   indicating improved functional mobility.  Baseline: EVAL  51% Goal status: INITIAL   PLAN:  PT FREQUENCY: 1-2x/week  PT DURATION: 6 weeks  PLANNED INTERVENTIONS: Therapeutic exercises, Therapeutic activity, Neuromuscular re-education, Balance training, Gait training, Patient/Family education, Self Care, Joint mobilization, Vestibular training, Dry Needling, Electrical stimulation, Spinal mobilization, Cryotherapy, Moist heat, Taping, Manual therapy, and Re-evaluation  PLAN FOR NEXT SESSION:  go over [place], assess TPDN   Garen Lah, PT, [facility] Certified Exercise Expert for the Aging Adult  11/25/22 3:52 PM Phone: [phone] Fax: [phone]   Date of referral: 11-09-22 Referring provider: Drema Dallas, DO  Referring diagnosis? M54.2 (ICD-10-CM) - Cervicalgia  Treatment diagnosis? (if different than referring diagnosis) muscle weakness , abnormal posture, shoulder pain L > R  What was this (referring dx) caused by? Other: Pt lifting items for friend moving and strained neck  Nature of Condition: Initial Onset (within last 3 months)   Laterality: Both  Current Functional Measure Score: FOTO 51%  predicted 60%  Objective measurements identify impairments when they are compared to normal values, the uninvolved extremity, and prior level of function.  [x]  Yes  []  No  Objective assessment of functional ability: Moderate functional limitations   Briefly describe symptoms: Pt with bil neck pain and decreased AROM in cervical rotation and DNF flexion test 5  seconds  How did symptoms start: Helping friend move and strained neck  Average pain intensity:  Last 24 hours: 9/10  Past week: 9/10  How often does the pt experience symptoms? Constantly  How much have the symptoms interfered with usual daily activities? Quite a bit  difficulty with housework and unable to complete  How has condition changed since care began at this facility? NA - initial visit  In general, how is the patients overall health? Fair   Garen Lah, PT, [facility] Certified Exercise Expert for the Aging Adult  11/25/22 3:55 PM Phone: [phone] Fax: [phone]

## 2022-11-25 ENCOUNTER — Other Ambulatory Visit: Payer: Self-pay

## 2022-11-25 ENCOUNTER — Ambulatory Visit: Payer: 59 | Attending: Neurology | Admitting: Physical Therapy

## 2022-11-25 ENCOUNTER — Encounter: Payer: Self-pay | Admitting: Physical Therapy

## 2022-11-25 DIAGNOSIS — M25512 Pain in left shoulder: Secondary | ICD-10-CM | POA: Diagnosis not present

## 2022-11-25 DIAGNOSIS — R293 Abnormal posture: Secondary | ICD-10-CM | POA: Diagnosis not present

## 2022-11-25 DIAGNOSIS — G8929 Other chronic pain: Secondary | ICD-10-CM | POA: Diagnosis not present

## 2022-11-25 DIAGNOSIS — M542 Cervicalgia: Secondary | ICD-10-CM | POA: Diagnosis not present

## 2022-11-25 DIAGNOSIS — M6281 Muscle weakness (generalized): Secondary | ICD-10-CM | POA: Insufficient documentation

## 2022-11-26 ENCOUNTER — Ambulatory Visit (INDEPENDENT_AMBULATORY_CARE_PROVIDER_SITE_OTHER): Payer: 59

## 2022-11-26 DIAGNOSIS — J309 Allergic rhinitis, unspecified: Secondary | ICD-10-CM | POA: Diagnosis not present

## 2022-12-07 ENCOUNTER — Ambulatory Visit: Payer: 59 | Attending: Neurology | Admitting: Physical Therapy

## 2022-12-07 ENCOUNTER — Encounter: Payer: Self-pay | Admitting: Physical Therapy

## 2022-12-07 DIAGNOSIS — M542 Cervicalgia: Secondary | ICD-10-CM | POA: Insufficient documentation

## 2022-12-07 DIAGNOSIS — M25512 Pain in left shoulder: Secondary | ICD-10-CM | POA: Insufficient documentation

## 2022-12-07 DIAGNOSIS — G8929 Other chronic pain: Secondary | ICD-10-CM | POA: Diagnosis not present

## 2022-12-07 DIAGNOSIS — R293 Abnormal posture: Secondary | ICD-10-CM | POA: Diagnosis not present

## 2022-12-07 DIAGNOSIS — M6281 Muscle weakness (generalized): Secondary | ICD-10-CM | POA: Diagnosis not present

## 2022-12-07 NOTE — Therapy (Signed)
OUTPATIENT PHYSICAL THERAPY TREATMENT NOTE   Patient Name: Melissa Gaines MRN: [id number] DOB:February 22, 1971, 52 y.o., female Today's Date: 12/07/2022  END OF SESSION:  PT End of Session - 12/07/22 0921     Visit Number 2    Number of Visits 12    Date for PT Re-Evaluation 01/06/23    Authorization Type [employer] MCR    PT Start Time 0922    PT Stop Time 1004    PT Time Calculation (min) 42 min    Activity Tolerance Patient tolerated treatment well;No increased pain    Behavior During Therapy [facility] for tasks assessed/performed              Past Medical History:  Diagnosis Date   Anemia    Anxiety    Asthma    Chronic pain    Congenital hip dislocation    GERD (gastroesophageal reflux disease)    Iron deficiency anemia 04/14/2018   Kidney mass 2017   Migraine    Osteoporosis    Pathological dislocation of shoulder joint, bilateral    congential   PTSD (post-traumatic stress disorder)    Sleep apnea    Systemic lupus erythematosus (HCC)    Urticaria    Past Surgical History:  Procedure Laterality Date   CESAREAN SECTION  2005   CESAREAN SECTION W/BTL  2010   HIP SURGERY     in childhood for congenital hip dislocation   LAPAROSCOPIC GASTRIC SLEEVE RESECTION  2014   STAPEDECTOMY  11/01/2011   L postauricular stapedectomy with CO2 laser and insertion of 6 x 4.75 mm SMart Piston   TOTAL HIP ARTHROPLASTY     05/2000 R, 11/2000 L   Patient Active Problem List   Diagnosis Date Noted   B12 deficiency 05/11/2022   Hyperlipidemia 05/07/2022   Prediabetes 05/07/2022   Environmental and seasonal allergies 08/03/2021   History of colonoscopy 07/31/2021   Asthma 07/01/2021   Gastroesophageal reflux disease 08/19/2020   Tinnitus aurium, 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   Lumbar back pain 01/25/2020   Left hip pain 06/26/2019   Muscle strain of lower extremity, left, initial encounter 06/26/2019   Healthcare maintenance 06/26/2019   Iron deficiency anemia 04/14/2018    Hematuria, gross 03/29/2018   Iron deficiency anemia due to chronic blood loss 03/29/2018   Long term current use of anticoagulant 03/29/2018   Antiphospholipid antibody syndrome (HCC) 01/25/2018   Morbid obesity (HCC) 07/26/2017   Systemic lupus erythematosus (HCC) 07/08/2017   Hx pulmonary embolism 07/08/2017   Chronic pain of right knee 05/23/2017   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/21/2016   Vitamin D deficiency 12/16/2016   Anemia    Migraine     PCP: Levin Erp, MD   REFERRING PROVIDER: Drema Dallas, DO   REFERRING DIAG: M54.2 (ICD-10-CM) - Cervicalgia   THERAPY DIAG:  Cervicalgia  Muscle weakness (generalized)  Abnormal posture  Chronic left shoulder pain  Rationale for Evaluation and Treatment: Rehabilitation  ONSET DATE: August 2024   SUBJECTIVE:  Per eval - I was helping a friend out moving and later I noticed when I woke up the next day, I had neck pain. I thought I might have slept the wrong way. Stated on my left side of my neck and now it is down the middle. When I turn to the R and the L , I hear my neck clicking. When I bend my neck all the way down , I feel dizzy.  I don't drive because of disability with dislocated hips since childbirth.  My shoulders give me problems as well.   I also have headaches daily but mild.  Bright lights are hard for me, I have tingling down both arms and both legs910   Hand dominance: Right  SUBJECTIVE STATEMENT: 12/07/2022 Pt states exercises at home have been going well, having less difficulty with turning head but still having pain with extension. 7-8/10 pain at present.   PERTINENT HISTORY:  Lupus, congenital hip dislocation, migraines,Pathological dislocation of shld joint bil, PTSD, Sleep apnea GERD, Asthma, Osteoporois  PAIN:  Are  you having pain? Yes: NPRS scale: 9/10 at worst 10/10 Pain location: BIL neck pain L > R and down the middle Pain description: sharp pain,,pounding Aggravating factors: when I have to turn my head, sweeping and mopping, washing the tub Relieving factors: laying down  Sleeping is hard because I have pain turning my neck PRECAUTIONS: None  RED FLAGS: None     WEIGHT BEARING RESTRICTIONS: No  FALLS:  Has patient fallen in last 6 months? No  LIVING ENVIRONMENT: Lives with: lives with their family Lives in: House/apartment Stairs: Yes: External: 1 steps; none Has following equipment at home: None  OCCUPATION: On disability  PLOF: Independent  PATIENT GOALS: My neck to feel normal and be able to turn  NEXT MD VISIT: in September late  OBJECTIVE: (objective measures completed at initial evaluation unless otherwise dated)   DIAGNOSTIC FINDINGS:  No recent  PATIENT SURVEYS:  FOTO 51 % predicted 60%  COGNITION: Overall cognitive status: Within functional limits for tasks assessed  SENSATION: Pt reports bil tingling in bil UE and LE  POSTURE: rounded shoulders, forward head, and flexed trunk  obesity  PALPATION: TTP over bil cervical paraspinals and UT  Left > Right   CERVICAL ROM:   Active ROM A/PROM (deg) eval  Flexion 30  Extension 38  Right lateral flexion 22  Left lateral flexion 19  Right rotation 35  Left rotation 40   (Blank rows = not tested)  UPPER EXTREMITY ROM:  Active ROM Right eval Left eval  Shoulder flexion 105 130  Shoulder extension    Shoulder abduction 105 110  Shoulder adduction    Shoulder extension    Shoulder internal rotation    Shoulder external rotation    Elbow flexion    Elbow extension    Wrist flexion    Wrist extension    Wrist ulnar deviation    Wrist radial deviation    Wrist pronation    Wrist supination     (Blank rows = not tested)  UPPER EXTREMITY MMT:  Pt with global tightness.  MMT Right eval  Left eval  Shoulder flexion 3+ 4  Shoulder extension    Shoulder abduction 3+ 4  Shoulder adduction    Shoulder extension    Shoulder internal rotation    Shoulder external rotation    Middle trapezius 4- 4-  Lower trapezius 3+ 3+  Elbow flexion    Elbow extension  Wrist flexion    Wrist extension    Wrist ulnar deviation    Wrist radial deviation    Wrist pronation    Wrist supination    Grip strength 30 lb 32 lb   (Blank rows = not tested)  CERVICAL SPECIAL TESTS:  DNF test  can only hold in supone 5 sec.  Pt with increased sensitivity with palpation at subocciptals and bil cervical paraspinals  FUNCTIONAL TESTS:  5 times sit to stand: 14.98   TODAY'S TREATMENT:                                                                                                                              [facility] Adult PT Treatment:                                                DATE: 12/07/22 Therapeutic Exercise: Seated chin tuck x8 cues for reduced UT compensations Seated UT stretch 2x30sec to R Seated LS stretch 2x30sec to R  Seated double ER + scapular retraction 2x10 cues for positioning  Supine cervical retraction x10 use of pillows as external cue to reduce compensations at UT and T spine Supine cervical rotation bilaterally x8 cues for comfortable ROM seated horizontal abduction, red band x10 cues for positioning and reduced compensations at UT Verbal HEP review + education   PATIENT EDUCATION:  Education details: rationale for interventions, HEP Person educated: Patient Education method: Explanation, Demonstration, Tactile cues, Verbal cues, and Handouts Education comprehension: verbalized understanding, returned demonstration, verbal cues required, tactile cues required, and needs further education  HOME EXERCISE PROGRAM: Access Code: [email or web address] URL: [email or web address] Date: 11/25/2022 Prepared by: Garen Lah  Exercises - Supine Deep Neck Flexor  Training  - 2-3 x daily - 7 x weekly - 10 reps - 3 hold - Seated Cervical Retraction Protraction AROM  - 2-3 x daily - 7 x weekly - 1 sets - 10 reps - Seated Gentle Upper Trapezius Stretch  - 1 x daily - 7 x weekly - 1 sets - 3 reps - 30 hold - Gentle Levator Scapulae Stretch  - 1 x daily - 7 x weekly - 3 sets - 3 reps - 30 hold - Shoulder External Rotation and Scapular Retraction with Resistance  - 1-2 x daily - 7 x weekly - 3 sets - 10 reps - 3-5 sec hold  ASSESSMENT:  CLINICAL IMPRESSION: 12/07/2022 Pt arrives w/ 7-8/10 pain on NPS, endorses some improvement with HEP since initial eval. Today focusing initially on HEP review - pt requires fairly extensive cueing to mitigate compensations at T spine and upper traps but overall tolerates quite well. Also able to expand program to work on cervical/GH mobility/strength with good tolerance, emphasis on reducing compensations. No adverse events, pt mild improvement in pain on departure (7/10). Continues to demonstrate reduced  cervical mobility, weakness of cervical/periscapular musculature. Recommend continuing along current POC in order to address relevant deficits and improve functional tolerance. Pt departs today's session in no acute distress, all voiced questions/concerns addressed appropriately from PT perspective.    Per eval - Patient is a 52 y.o. female  who was seen today for physical therapy evaluation and treatment for cervicalgia and and musculoskeletal strain after helping a friend move a month ago.  Pt complains of pain and difficulty turning her neck during day for sweeping and vacuuming and while sleeping at night.  Pt also has SLE and congenital hip dislocations/shoulder dislocations and subsequent muscle weakness.  Pt will benefit from skilled PT to address impairments that keep her from daily activities. Pt currently is not participating in any regular exercise. Will progress toward completion of goals.  OBJECTIVE IMPAIRMENTS: decreased  activity tolerance, decreased ROM, decreased strength, dizziness, impaired sensation, impaired UE functional use, improper body mechanics, postural dysfunction, obesity, and pain.   ACTIVITY LIMITATIONS: carrying, lifting, sleeping, and reach over head  PARTICIPATION LIMITATIONS: meal prep, cleaning, and laundry  PERSONAL FACTORS: Lupus, congenital hip dislocation, migraines,Pathological dislocation of shld joint bil, PTSD, Sleep apnea GERD, Asthma, Osteoporois are also affecting patient's functional outcome.   REHAB POTENTIAL: Good for cervical  Fair for shoulders and hips due to congenital issues and dislocations  CLINICAL DECISION MAKING: Evolving/moderate complexity  EVALUATION COMPLEXITY: Moderate   GOALS: Goals reviewed with patient? Yes  SHORT TERM GOALS: Target date: 11-16-22  Pt will be independent with initial HEP Baseline:  Goal status: INITIAL  2.  Pt pain  will reduce to at least 6/10 from 9/10 with turning neck Baseline: See AROM chart Goal status: INITIAL  3.  Demonstrate understanding of proper sitting posture, body mechanics, work ergonomics, and be more conscious of position and posture throughout the day.  Baseline:  Goal status: INITIAL   Goal status: INITIAL  LONG TERM GOALS: Target date: 01-06-23  Pt will be independent with advanced HEP Baseline: no knowledge Goal status: INITIAL  2.  Pt will be able to turn neck to 45 degrees minimally to improve environmental scanning Baseline: See AROM chart Goal status: INITIAL  3.  Pt will be able to do household chores with 50% greater ease Baseline: unable to clean tub and perform household tasks of sweeping and vacuuming Goal status: INITIAL  4.  Pt will be able to improve DNF supine test by at least 25 seconds to show increase in neck strength Baseline: unable to hold in supie Goal status: INITIAL  5.  Pt will report 3/10 or less with turning head at night in order to have 4 or more hours of restful  sleep Baseline: wakes every 2 hours Goal status: INITIAL  6.  FOTO will improve from 51%   to  60%   indicating improved functional mobility.  Baseline: EVAL  51% Goal status: INITIAL   PLAN:  PT FREQUENCY: 1-2x/week  PT DURATION: 6 weeks  PLANNED INTERVENTIONS: Therapeutic exercises, Therapeutic activity, Neuromuscular re-education, Balance training, Gait training, Patient/Family education, Self Care, Joint mobilization, Vestibular training, Dry Needling, Electrical stimulation, Spinal mobilization, Cryotherapy, Moist heat, Taping, Manual therapy, and Re-evaluation  PLAN FOR NEXT SESSION:  assess appropriateness of TPDN and manual for symptom modification PRN. Continue to work on cervical mobility, cervical/periscapular endurance while minimizing compensations.   Date of referral: 11-09-22 Referring provider: Drema Dallas, DO  Referring diagnosis? M54.2 (ICD-10-CM) - Cervicalgia  Treatment diagnosis? (if different than referring diagnosis) muscle weakness ,  abnormal posture, shoulder pain L > R  What was this (referring dx) caused by? Other: Pt lifting items for friend moving and strained neck  Nature of Condition: Initial Onset (within last 3 months)   Laterality: Both  Current Functional Measure Score: FOTO 51%  predicted 60%  Objective measurements identify impairments when they are compared to normal values, the uninvolved extremity, and prior level of function.  [x]  Yes  []  No  Objective assessment of functional ability: Moderate functional limitations   Briefly describe symptoms: Pt with bil neck pain and decreased AROM in cervical rotation and DNF flexion test 5 seconds  How did symptoms start: Helping friend move and strained neck  Average pain intensity:  Last 24 hours: 9/10  Past week: 9/10  How often does the pt experience symptoms? Constantly  How much have the symptoms interfered with usual daily activities? Quite a bit  difficulty with housework and  unable to complete  How has condition changed since care began at this facility? NA - initial visit  In general, how is the patients overall health? Fair   Ashley Murrain PT, DPT 12/07/2022 10:09 AM

## 2022-12-09 ENCOUNTER — Ambulatory Visit: Payer: 59 | Admitting: Physical Therapy

## 2022-12-09 ENCOUNTER — Encounter: Payer: Self-pay | Admitting: Physical Therapy

## 2022-12-09 DIAGNOSIS — M6281 Muscle weakness (generalized): Secondary | ICD-10-CM | POA: Diagnosis not present

## 2022-12-09 DIAGNOSIS — M542 Cervicalgia: Secondary | ICD-10-CM | POA: Diagnosis not present

## 2022-12-09 DIAGNOSIS — R293 Abnormal posture: Secondary | ICD-10-CM

## 2022-12-09 DIAGNOSIS — M25512 Pain in left shoulder: Secondary | ICD-10-CM | POA: Diagnosis not present

## 2022-12-09 DIAGNOSIS — G8929 Other chronic pain: Secondary | ICD-10-CM

## 2022-12-09 NOTE — Patient Instructions (Signed)

## 2022-12-09 NOTE — Therapy (Signed)
OUTPATIENT PHYSICAL THERAPY TREATMENT NOTE   Patient Name: Melissa Gaines MRN: [id number] DOB:12-11-1970, 52 y.o., female Today's Date: 12/09/2022  END OF SESSION:  PT End of Session - 12/09/22 0932     Visit Number 3    Number of Visits 12    Date for PT Re-Evaluation 01/06/23    Authorization Type [employer] MCR    PT Start Time 0932    PT Stop Time 1015    PT Time Calculation (min) 43 min    Activity Tolerance Patient tolerated treatment well;No increased pain    Behavior During Therapy [facility] for tasks assessed/performed               Past Medical History:  Diagnosis Date   Anemia    Anxiety    Asthma    Chronic pain    Congenital hip dislocation    GERD (gastroesophageal reflux disease)    Iron deficiency anemia 04/14/2018   Kidney mass 2017   Migraine    Osteoporosis    Pathological dislocation of shoulder joint, bilateral    congential   PTSD (post-traumatic stress disorder)    Sleep apnea    Systemic lupus erythematosus (HCC)    Urticaria    Past Surgical History:  Procedure Laterality Date   CESAREAN SECTION  2005   CESAREAN SECTION W/BTL  2010   HIP SURGERY     in childhood for congenital hip dislocation   LAPAROSCOPIC GASTRIC SLEEVE RESECTION  2014   STAPEDECTOMY  11/01/2011   L postauricular stapedectomy with CO2 laser and insertion of 6 x 4.75 mm SMart Piston   TOTAL HIP ARTHROPLASTY     05/2000 R, 11/2000 L   Patient Active Problem List   Diagnosis Date Noted   B12 deficiency 05/11/2022   Hyperlipidemia 05/07/2022   Prediabetes 05/07/2022   Environmental and seasonal allergies 08/03/2021   History of colonoscopy 07/31/2021   Asthma 07/01/2021   Gastroesophageal reflux disease 08/19/2020   Tinnitus aurium, 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   Lumbar back pain 01/25/2020   Left hip pain 06/26/2019   Muscle strain of lower extremity, left, initial encounter 06/26/2019   Healthcare maintenance 06/26/2019   Iron deficiency anemia 04/14/2018    Hematuria, gross 03/29/2018   Iron deficiency anemia due to chronic blood loss 03/29/2018   Long term current use of anticoagulant 03/29/2018   Antiphospholipid antibody syndrome (HCC) 01/25/2018   Morbid obesity (HCC) 07/26/2017   Systemic lupus erythematosus (HCC) 07/08/2017   Hx pulmonary embolism 07/08/2017   Chronic pain of right knee 05/23/2017   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/21/2016   Vitamin D deficiency 12/16/2016   Anemia    Migraine     PCP: Levin Erp, MD   REFERRING PROVIDER: Drema Dallas, DO   REFERRING DIAG: M54.2 (ICD-10-CM) - Cervicalgia   THERAPY DIAG:  Cervicalgia  Muscle weakness (generalized)  Abnormal posture  Chronic left shoulder pain  Rationale for Evaluation and Treatment: Rehabilitation  ONSET DATE: August 2024   SUBJECTIVE:  Per eval - I was helping a friend out moving and later I noticed when I woke up the next day, I had neck pain. I thought I might have slept the wrong way. Stated on my left side of my neck and now it is down the middle. When I turn to the R and the L , I hear my neck clicking. When I bend my neck all the way down , I feel dizzy.  I don't drive because of disability with dislocated hips since childbirth.  My shoulders give me problems as well.   I also have headaches daily but mild.  Bright lights are hard for me, I have tingling down both arms and both legs910   Hand dominance: Right  SUBJECTIVE STATEMENT: 12/09/2022  I feel a little the same because of the weather.   My pain went down on the neck in the back area 7/10 now. I am trying to do the exercises and I can remember when I have the sheet with me.    PERTINENT HISTORY:  Lupus, congenital hip dislocation, migraines,Pathological dislocation of shld joint bil, PTSD, Sleep apnea  GERD, Asthma, Osteoporois  PAIN:  Are you having pain? Yes: NPRS scale: 9/10 at worst 10/10 Pain location: BIL neck pain L > R and down the middle Pain description: sharp pain,,pounding Aggravating factors: when I have to turn my head, sweeping and mopping, washing the tub Relieving factors: laying down  Sleeping is hard because I have pain turning my neck PRECAUTIONS: None  RED FLAGS: None     WEIGHT BEARING RESTRICTIONS: No  FALLS:  Has patient fallen in last 6 months? No  LIVING ENVIRONMENT: Lives with: lives with their family Lives in: House/apartment Stairs: Yes: External: 1 steps; none Has following equipment at home: None  OCCUPATION: On disability  PLOF: Independent  PATIENT GOALS: My neck to feel normal and be able to turn  NEXT MD VISIT: in September late  OBJECTIVE: (objective measures completed at initial evaluation unless otherwise dated)   DIAGNOSTIC FINDINGS:  No recent  PATIENT SURVEYS:  FOTO 51 % predicted 60%  COGNITION: Overall cognitive status: Within functional limits for tasks assessed  SENSATION: Pt reports bil tingling in bil UE and LE  POSTURE: rounded shoulders, forward head, and flexed trunk  obesity  PALPATION: TTP over bil cervical paraspinals and UT  Left > Right   CERVICAL ROM:   Active ROM A/PROM (deg) eval  Flexion 30  Extension 38  Right lateral flexion 22  Left lateral flexion 19  Right rotation 35  Left rotation 40   (Blank rows = not tested)  UPPER EXTREMITY ROM:  Active ROM Right eval Left eval  Shoulder flexion 105 130  Shoulder extension    Shoulder abduction 105 110  Shoulder adduction    Shoulder extension    Shoulder internal rotation    Shoulder external rotation    Elbow flexion    Elbow extension    Wrist flexion    Wrist extension    Wrist ulnar deviation    Wrist radial deviation    Wrist pronation    Wrist supination     (Blank rows = not tested)  UPPER EXTREMITY MMT:  Pt with  global tightness.  MMT Right eval Left eval  Shoulder flexion 3+ 4  Shoulder extension    Shoulder abduction 3+ 4  Shoulder adduction    Shoulder extension    Shoulder internal rotation    Shoulder external rotation  Middle trapezius 4- 4-  Lower trapezius 3+ 3+  Elbow flexion    Elbow extension    Wrist flexion    Wrist extension    Wrist ulnar deviation    Wrist radial deviation    Wrist pronation    Wrist supination    Grip strength 30 lb 32 lb   (Blank rows = not tested)  CERVICAL SPECIAL TESTS:  DNF test  can only hold in supone 5 sec.  Pt with increased sensitivity with palpation at subocciptals and bil cervical paraspinals  FUNCTIONAL TESTS:  5 times sit to stand: 14.98   TODAY'S TREATMENT:  [facility] Adult PT Treatment:                                                DATE: 12-09-22 Therapeutic Exercise: Supine DNF -10 reps - 5-10 sec hold Seated Cervical Retraction Protraction AROM1 sets - 10 reps VC for stacking posture correctly and not sitting on tailbone.  Seated UT Stretch  3 reps - 30 hold Levator Scapulae Stretch  -3 reps - 30 hold Standing alternating wall slides using wash cloths 1 x 15 eah  Shoulder External Rotation and Scapular Retraction with RTB  3 x 10 Manual Therapy: Gentle cervical distraction Suboccipital release Scalenes Myofascial release Thoracic extension with PA grade II mobs for comfort  Trigger Point Dry-Needling performed     by Garen Lah Treatment instructions: Expect mild to moderate muscle soreness. S/S of pneumothorax if dry needled over a lung field, and to seek immediate medical attention should they occur. Patient verbalized understanding of these instructions and education.  Patient Consent Given: Yes Education handout provided: Previously provided Muscles treated:  Electrical stimulation performed: No Parameters: N/A Treatment response/outcome: twitch response noted, pt noted relief                                                                                                                            [facility] Adult PT Treatment:                                                DATE: 12/07/22 Therapeutic Exercise: Seated chin tuck x8 cues for reduced UT compensations Seated UT stretch 2x30sec to R Seated LS stretch 2x30sec to R  Seated double ER + scapular retraction 2x10 cues for positioning  Supine cervical retraction x10 use of pillows as external cue to reduce compensations at UT and T spine Supine cervical rotation bilaterally x8 cues for comfortable ROM seated horizontal abduction, red band x10 cues for positioning and reduced compensations at UT Verbal HEP review + education   PATIENT EDUCATION:  Education details: rationale for interventions, HEP Person educated: Patient Education method: Explanation,  Demonstration, Tactile cues, Verbal cues, and Handouts Education comprehension: verbalized understanding, returned demonstration, verbal cues required, tactile cues required, and needs further education  HOME EXERCISE PROGRAM: Access Code: [email or web address] URL: [email or web address] Date: 11/25/2022 Prepared by: Garen Lah  Exercises - Supine Deep Neck Flexor Training  - 2-3 x daily - 7 x weekly - 10 reps - 3 hold - Seated Cervical Retraction Protraction AROM  - 2-3 x daily - 7 x weekly - 1 sets - 10 reps - Seated Gentle Upper Trapezius Stretch  - 1 x daily - 7 x weekly - 1 sets - 3 reps - 30 hold - Gentle Levator Scapulae Stretch  - 1 x daily - 7 x weekly - 3 sets - 3 reps - 30 hold - Shoulder External Rotation and Scapular Retraction with Resistance  - 1-2 x daily - 7 x weekly - 3 sets - 10 reps - 3-5 sec hold  ASSESSMENT:  CLINICAL IMPRESSION: 12/09/2022 Pt arrives  7/10 pain on NPS and reduced to 3-4/10 at end of session.Today focusing on HEP review - pt requires extensive cueing to mitigate compensations at T spine and upper traps but overall tolerates quite well and needs  reinforcement of  proper execution of exercises.   Also able to expand program to work on cervical/GH mobility/strength with good tolerance, emphasis on reducing compensations. Pt tends to sit with flexed spine. May need to add more thoracic extension.  Pt departs today's session in no acute distress, all voiced questions/concerns addressed appropriately from PT perspective.    Per eval - Patient is a 52 y.o. female  who was seen today for physical therapy evaluation and treatment for cervicalgia and and musculoskeletal strain after helping a friend move a month ago.  Pt complains of pain and difficulty turning her neck during day for sweeping and vacuuming and while sleeping at night.  Pt also has SLE and congenital hip dislocations/shoulder dislocations and subsequent muscle weakness.  Pt will benefit from skilled PT to address impairments that keep her from daily activities. Pt currently is not participating in any regular exercise. Will progress toward completion of goals.  OBJECTIVE IMPAIRMENTS: decreased activity tolerance, decreased ROM, decreased strength, dizziness, impaired sensation, impaired UE functional use, improper body mechanics, postural dysfunction, obesity, and pain.   ACTIVITY LIMITATIONS: carrying, lifting, sleeping, and reach over head  PARTICIPATION LIMITATIONS: meal prep, cleaning, and laundry  PERSONAL FACTORS: Lupus, congenital hip dislocation, migraines,Pathological dislocation of shld joint bil, PTSD, Sleep apnea GERD, Asthma, Osteoporois are also affecting patient's functional outcome.   REHAB POTENTIAL: Good for cervical  Fair for shoulders and hips due to congenital issues and dislocations  CLINICAL DECISION MAKING: Evolving/moderate complexity  EVALUATION COMPLEXITY: Moderate   GOALS: Goals reviewed with patient? Yes  SHORT TERM GOALS: Target date: 11-16-22  Pt will be independent with initial HEP Baseline:  12-09-22  pt still needs guidance with HEP Goal  status: ONGOING  2.  Pt pain  will reduce to at least 6/10 from 9/10 with turning neck Baseline: See AROM chart 12-09-22  Pt 7/10 and at end 3-4/10 pain Goal status: MET  3.  Demonstrate understanding of proper sitting posture, body mechanics, work ergonomics, and be more conscious of position and posture throughout the day.  Baseline:   Goal status: INITIAL   Goal status: INITIAL  LONG TERM GOALS: Target date: 01-06-23  Pt will be independent with advanced HEP Baseline: no knowledge Goal status: INITIAL  2.  Pt will be able to turn neck to  45 degrees minimally to improve environmental scanning Baseline: See AROM chart Goal status: INITIAL  3.  Pt will be able to do household chores with 50% greater ease Baseline: unable to clean tub and perform household tasks of sweeping and vacuuming Goal status: INITIAL  4.  Pt will be able to improve DNF supine test by at least 25 seconds to show increase in neck strength Baseline: unable to hold in supie Goal status: INITIAL  5.  Pt will report 3/10 or less with turning head at night in order to have 4 or more hours of restful sleep Baseline: wakes every 2 hours Goal status: INITIAL  6.  FOTO will improve from 51%   to  60%   indicating improved functional mobility.  Baseline: EVAL  51% Goal status: INITIAL   PLAN:  PT FREQUENCY: 1-2x/week  PT DURATION: 6 weeks  PLANNED INTERVENTIONS: Therapeutic exercises, Therapeutic activity, Neuromuscular re-education, Balance training, Gait training, Patient/Family education, Self Care, Joint mobilization, Vestibular training, Dry Needling, Electrical stimulation, Spinal mobilization, Cryotherapy, Moist heat, Taping, Manual therapy, and Re-evaluation  PLAN FOR NEXT SESSION:  assess appropriateness of TPDN and manual for symptom modification PRN. Continue to work on cervical mobility, cervical/periscapular endurance while minimizing compensations.   Date of referral: 11-09-22 Referring  provider: Drema Dallas, DO  Referring diagnosis? M54.2 (ICD-10-CM) - Cervicalgia  Treatment diagnosis? (if different than referring diagnosis) muscle weakness , abnormal posture, shoulder pain L > R  What was this (referring dx) caused by? Other: Pt lifting items for friend moving and strained neck  Nature of Condition: Initial Onset (within last 3 months)   Laterality: Both  Current Functional Measure Score: FOTO 51%  predicted 60%  Objective measurements identify impairments when they are compared to normal values, the uninvolved extremity, and prior level of function.  [x]  Yes  []  No  Objective assessment of functional ability: Moderate functional limitations   Briefly describe symptoms: Pt with bil neck pain and decreased AROM in cervical rotation and DNF flexion test 5 seconds  How did symptoms start: Helping friend move and strained neck  Average pain intensity:  Last 24 hours: 9/10  Past week: 9/10  How often does the pt experience symptoms? Constantly  How much have the symptoms interfered with usual daily activities? Quite a bit  difficulty with housework and unable to complete  How has condition changed since care began at this facility? NA - initial visit  In general, how is the patients overall health? Fair   Garen Lah, PT, [facility] Certified Exercise Expert for the Aging Adult  12/09/22 11:34 AM Phone: [phone] Fax: [phone]

## 2022-12-10 ENCOUNTER — Other Ambulatory Visit: Payer: Self-pay | Admitting: Student

## 2022-12-10 ENCOUNTER — Other Ambulatory Visit (HOSPITAL_COMMUNITY): Payer: Self-pay | Admitting: Psychiatry

## 2022-12-10 ENCOUNTER — Ambulatory Visit (INDEPENDENT_AMBULATORY_CARE_PROVIDER_SITE_OTHER): Payer: 59

## 2022-12-10 DIAGNOSIS — F4310 Post-traumatic stress disorder, unspecified: Secondary | ICD-10-CM

## 2022-12-10 DIAGNOSIS — J309 Allergic rhinitis, unspecified: Secondary | ICD-10-CM | POA: Diagnosis not present

## 2022-12-10 DIAGNOSIS — D6861 Antiphospholipid syndrome: Secondary | ICD-10-CM

## 2022-12-14 ENCOUNTER — Ambulatory Visit: Payer: 59 | Admitting: Physical Therapy

## 2022-12-14 ENCOUNTER — Telehealth (HOSPITAL_BASED_OUTPATIENT_CLINIC_OR_DEPARTMENT_OTHER): Payer: 59 | Admitting: Psychiatry

## 2022-12-14 ENCOUNTER — Encounter (HOSPITAL_COMMUNITY): Payer: Self-pay | Admitting: Psychiatry

## 2022-12-14 VITALS — Wt 209.0 lb

## 2022-12-14 DIAGNOSIS — F331 Major depressive disorder, recurrent, moderate: Secondary | ICD-10-CM | POA: Diagnosis not present

## 2022-12-14 DIAGNOSIS — F4310 Post-traumatic stress disorder, unspecified: Secondary | ICD-10-CM

## 2022-12-14 MED ORDER — FLUOXETINE HCL 20 MG PO CAPS
20.0000 mg | ORAL_CAPSULE | Freq: Every day | ORAL | 0 refills | Status: DC
Start: 2022-12-14 — End: 2023-03-17

## 2022-12-14 MED ORDER — GABAPENTIN 300 MG PO CAPS
300.0000 mg | ORAL_CAPSULE | Freq: Every day | ORAL | 0 refills | Status: DC
Start: 2022-12-14 — End: 2023-03-17

## 2022-12-14 MED ORDER — TRAZODONE HCL 100 MG PO TABS
100.0000 mg | ORAL_TABLET | Freq: Every day | ORAL | 0 refills | Status: DC
Start: 2022-12-14 — End: 2023-03-17
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
## 2022-12-16 ENCOUNTER — Ambulatory Visit: Payer: 59 | Admitting: Physical Therapy

## 2022-12-21 ENCOUNTER — Ambulatory Visit: Payer: 59 | Admitting: Physical Therapy

## 2022-12-21 ENCOUNTER — Encounter: Payer: Self-pay | Admitting: Physical Therapy

## 2022-12-21 DIAGNOSIS — M6281 Muscle weakness (generalized): Secondary | ICD-10-CM | POA: Diagnosis not present

## 2022-12-21 DIAGNOSIS — M542 Cervicalgia: Secondary | ICD-10-CM | POA: Diagnosis not present

## 2022-12-21 DIAGNOSIS — R293 Abnormal posture: Secondary | ICD-10-CM | POA: Diagnosis not present

## 2022-12-21 DIAGNOSIS — M25512 Pain in left shoulder: Secondary | ICD-10-CM | POA: Diagnosis not present

## 2022-12-21 DIAGNOSIS — G8929 Other chronic pain: Secondary | ICD-10-CM | POA: Diagnosis not present

## 2022-12-21 NOTE — Therapy (Signed)
OUTPATIENT PHYSICAL THERAPY TREATMENT NOTE   Patient Name: Melissa Gaines MRN: [id number] DOB:1970/04/10, 52 y.o., female Today's Date: 12/21/2022  END OF SESSION:  PT End of Session - 12/21/22 1103     Visit Number 4    Number of Visits 12    Date for PT Re-Evaluation 01/06/23    Authorization Type [employer] MCR    PT Start Time 1102    PT Stop Time 1145    PT Time Calculation (min) 43 min    Activity Tolerance Patient tolerated treatment well;No increased pain    Behavior During Therapy [facility] for tasks assessed/performed                Past Medical History:  Diagnosis Date   Anemia    Anxiety    Asthma    Chronic pain    Congenital hip dislocation    GERD (gastroesophageal reflux disease)    Iron deficiency anemia 04/14/2018   Kidney mass 2017   Migraine    Osteoporosis    Pathological dislocation of shoulder joint, bilateral    congential   PTSD (post-traumatic stress disorder)    Sleep apnea    Systemic lupus erythematosus (HCC)    Urticaria    Past Surgical History:  Procedure Laterality Date   CESAREAN SECTION  2005   CESAREAN SECTION W/BTL  2010   HIP SURGERY     in childhood for congenital hip dislocation   LAPAROSCOPIC GASTRIC SLEEVE RESECTION  2014   STAPEDECTOMY  11/01/2011   L postauricular stapedectomy with CO2 laser and insertion of 6 x 4.75 mm SMart Piston   TOTAL HIP ARTHROPLASTY     05/2000 R, 11/2000 L   Patient Active Problem List   Diagnosis Date Noted   B12 deficiency 05/11/2022   Hyperlipidemia 05/07/2022   Prediabetes 05/07/2022   Environmental and seasonal allergies 08/03/2021   History of colonoscopy 07/31/2021   Asthma 07/01/2021   Gastroesophageal reflux disease 08/19/2020   Tinnitus aurium, 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   Lumbar back pain 01/25/2020   Left hip pain 06/26/2019   Muscle strain of lower extremity, left, initial encounter 06/26/2019   Healthcare maintenance 06/26/2019   Iron deficiency anemia 04/14/2018    Hematuria, gross 03/29/2018   Iron deficiency anemia due to chronic blood loss 03/29/2018   Long term current use of anticoagulant 03/29/2018   Antiphospholipid antibody syndrome (HCC) 01/25/2018   Morbid obesity (HCC) 07/26/2017   Systemic lupus erythematosus (HCC) 07/08/2017   Hx pulmonary embolism 07/08/2017   Chronic pain of right knee 05/23/2017   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/21/2016   Vitamin D deficiency 12/16/2016   Anemia    Migraine     PCP: Levin Erp, MD   REFERRING PROVIDER: Drema Dallas, DO   REFERRING DIAG: M54.2 (ICD-10-CM) - Cervicalgia   THERAPY DIAG:  Cervicalgia  Muscle weakness (generalized)  Abnormal posture  Chronic left shoulder pain  Rationale for Evaluation and Treatment: Rehabilitation  ONSET DATE: August 2024   SUBJECTIVE:  Per eval - I was helping a friend out moving and later I noticed when I woke up the next day, I had neck pain. I thought I might have slept the wrong way. Stated on my left side of my neck and now it is down the middle. When I turn to the R and the L , I hear my neck clicking. When I bend my neck all the way down , I feel dizzy.  I don't drive because of disability with dislocated hips since childbirth.  My shoulders give me problems as well.   I also have headaches daily but mild.  Bright lights are hard for me, I have tingling down both arms and both legs910   Hand dominance: Right  SUBJECTIVE STATEMENT: 12/21/2022  " The needling helped, and on Saturday and Sunday with the weather the pain started coming." She reports she self cracks her neck 3-4 x  pain today is 8/10.  PERTINENT HISTORY:  Lupus, congenital hip dislocation, migraines,Pathological dislocation of shld joint bil, PTSD, Sleep apnea GERD, Asthma, Osteoporois  PAIN:   Are you having pain? Yes: NPRS scale: 8/10 Pain location: BIL neck pain L > R and down the middle Pain description: sharp pain,,pounding Aggravating factors: when I have to turn my head, sweeping and mopping, washing the tub Relieving factors: laying down  Sleeping is hard because I have pain turning my neck PRECAUTIONS: None  RED FLAGS: None     WEIGHT BEARING RESTRICTIONS: No  FALLS:  Has patient fallen in last 6 months? No  LIVING ENVIRONMENT: Lives with: lives with their family Lives in: House/apartment Stairs: Yes: External: 1 steps; none Has following equipment at home: None  OCCUPATION: On disability  PLOF: Independent  PATIENT GOALS: My neck to feel normal and be able to turn  NEXT MD VISIT: in September late  OBJECTIVE: (objective measures completed at initial evaluation unless otherwise dated)   DIAGNOSTIC FINDINGS:  No recent  PATIENT SURVEYS:  FOTO 51 % predicted 60%  COGNITION: Overall cognitive status: Within functional limits for tasks assessed  SENSATION: Pt reports bil tingling in bil UE and LE  POSTURE: rounded shoulders, forward head, and flexed trunk  obesity  PALPATION: TTP over bil cervical paraspinals and UT  Left > Right   CERVICAL ROM:   Active ROM A/PROM (deg) eval  Flexion 30  Extension 38  Right lateral flexion 22  Left lateral flexion 19  Right rotation 35  Left rotation 40   (Blank rows = not tested)  UPPER EXTREMITY ROM:  Active ROM Right eval Left eval  Shoulder flexion 105 130  Shoulder extension    Shoulder abduction 105 110  Shoulder adduction    Shoulder extension    Shoulder internal rotation    Shoulder external rotation    Elbow flexion    Elbow extension    Wrist flexion    Wrist extension    Wrist ulnar deviation    Wrist radial deviation    Wrist pronation    Wrist supination     (Blank rows = not tested)  UPPER EXTREMITY MMT:  Pt with global tightness.  MMT Right eval Left eval   Shoulder flexion 3+ 4  Shoulder extension    Shoulder abduction 3+ 4  Shoulder adduction    Shoulder extension    Shoulder internal rotation    Shoulder external rotation    Middle trapezius 4- 4-  Lower trapezius 3+ 3+  Elbow flexion    Elbow extension  Wrist flexion    Wrist extension    Wrist ulnar deviation    Wrist radial deviation    Wrist pronation    Wrist supination    Grip strength 30 lb 32 lb   (Blank rows = not tested)  CERVICAL SPECIAL TESTS:  DNF test  can only hold in supone 5 sec.  Pt with increased sensitivity with palpation at subocciptals and bil cervical paraspinals  FUNCTIONAL TESTS:  5 times sit to stand: 14.98   TODAY'S TREATMENT:  [facility] Adult PT Treatment:                                                DATE: 12/21/22 Therapeutic Exercise: Supine chin tuck 1 x 10 holding 10 sec Supine scapular retraction 1 x 10 holding 5 seconds Upper trap / Levator scapulae stretch 1 x 30 sec on L only Upper cervical rotation.  Manual Therapy: Sub-occipital release- taught how to perform at home.  L upper trap/ levator scpuale stretch Tack and stretch of the sub-occipitals Self Care: Reviewed posture and effects of rolling shoulders and benefit of replacing rolling with stretching to reduce the upper trap from becoming over activated.    [facility] Adult PT Treatment:                                                DATE: 12-09-22 Therapeutic Exercise: Supine DNF -10 reps - 5-10 sec hold Seated Cervical Retraction Protraction AROM1 sets - 10 reps VC for stacking posture correctly and not sitting on tailbone.  Seated UT Stretch  3 reps - 30 hold Levator Scapulae Stretch  -3 reps - 30 hold Standing alternating wall slides using wash cloths 1 x 15 eah  Shoulder External Rotation and Scapular Retraction with RTB  3 x 10 Manual Therapy: Gentle cervical distraction Suboccipital release Scalenes Myofascial release Thoracic extension with PA grade II mobs for  comfort  Trigger Point Dry-Needling performed     by Garen Lah Treatment instructions: Expect mild to moderate muscle soreness. S/S of pneumothorax if dry needled over a lung field, and to seek immediate medical attention should they occur. Patient verbalized understanding of these instructions and education.  Patient Consent Given: Yes Education handout provided: Previously provided Muscles treated:  Electrical stimulation performed: No Parameters: N/A Treatment response/outcome: twitch response noted, pt noted relief                                                                                                                           [facility] Adult PT Treatment:  DATE: 12/07/22 Therapeutic Exercise: Seated chin tuck x8 cues for reduced UT compensations Seated UT stretch 2x30sec to R Seated LS stretch 2x30sec to R  Seated double ER + scapular retraction 2x10 cues for positioning  Supine cervical retraction x10 use of pillows as external cue to reduce compensations at UT and T spine Supine cervical rotation bilaterally x8 cues for comfortable ROM seated horizontal abduction, red band x10 cues for positioning and reduced compensations at UT Verbal HEP review + education   PATIENT EDUCATION:  Education details: rationale for interventions, HEP Person educated: Patient Education method: Explanation, Demonstration, Tactile cues, Verbal cues, and Handouts Education comprehension: verbalized understanding, returned demonstration, verbal cues required, tactile cues required, and needs further education  HOME EXERCISE PROGRAM: Access Code: [email or web address] URL: [email or web address] Date: 11/25/2022 Prepared by: Garen Lah  Exercises - Supine Deep Neck Flexor Training  - 2-3 x daily - 7 x weekly - 10 reps - 3 hold - Seated Cervical Retraction Protraction AROM  - 2-3 x daily - 7 x weekly - 1 sets - 10 reps - Seated Gentle  Upper Trapezius Stretch  - 1 x daily - 7 x weekly - 1 sets - 3 reps - 30 hold - Gentle Levator Scapulae Stretch  - 1 x daily - 7 x weekly - 3 sets - 3 reps - 30 hold - Shoulder External Rotation and Scapular Retraction with Resistance  - 1-2 x daily - 7 x weekly - 3 sets - 10 reps - 3-5 sec hold  ASSESSMENT:  CLINICAL IMPRESSION: 12/21/2022 Pt arrives to session continuing to report elevated level of pain at 8/10. She reports she still gets a lot of cracking in her neck that occurs a number of times a day, but also occurs in her shoulders too. Focused on manual trigger point release and ways it can be done at home to help bridge the treatment done in the clinic. She responded well to treatment noting relief of pain / tension. Worked on posture correction exercises and time as taken to review posture and habits. End of session she noted pain did go down to a 7/10.   Per eval - Patient is a 52 y.o. female  who was seen today for physical therapy evaluation and treatment for cervicalgia and and musculoskeletal strain after helping a friend move a month ago.  Pt complains of pain and difficulty turning her neck during day for sweeping and vacuuming and while sleeping at night.  Pt also has SLE and congenital hip dislocations/shoulder dislocations and subsequent muscle weakness.  Pt will benefit from skilled PT to address impairments that keep her from daily activities. Pt currently is not participating in any regular exercise. Will progress toward completion of goals.  OBJECTIVE IMPAIRMENTS: decreased activity tolerance, decreased ROM, decreased strength, dizziness, impaired sensation, impaired UE functional use, improper body mechanics, postural dysfunction, obesity, and pain.   ACTIVITY LIMITATIONS: carrying, lifting, sleeping, and reach over head  PARTICIPATION LIMITATIONS: meal prep, cleaning, and laundry  PERSONAL FACTORS: Lupus, congenital hip dislocation, migraines,Pathological dislocation of  shld joint bil, PTSD, Sleep apnea GERD, Asthma, Osteoporois are also affecting patient's functional outcome.   REHAB POTENTIAL: Good for cervical  Fair for shoulders and hips due to congenital issues and dislocations  CLINICAL DECISION MAKING: Evolving/moderate complexity  EVALUATION COMPLEXITY: Moderate   GOALS: Goals reviewed with patient? Yes  SHORT TERM GOALS: Target date: 11-16-22  Pt will be independent with initial HEP Baseline:  12-09-22  pt still needs guidance with HEP Goal status:  ONGOING  2.  Pt pain  will reduce to at least 6/10 from 9/10 with turning neck Baseline: See AROM chart 12-09-22  Pt 7/10 and at end 3-4/10 pain Goal status: MET  3.  Demonstrate understanding of proper sitting posture, body mechanics, work ergonomics, and be more conscious of position and posture throughout the day.  Baseline:   Goal status: INITIAL   Goal status: INITIAL  LONG TERM GOALS: Target date: 01-06-23  Pt will be independent with advanced HEP Baseline: no knowledge Goal status: INITIAL  2.  Pt will be able to turn neck to 45 degrees minimally to improve environmental scanning Baseline: See AROM chart Goal status: INITIAL  3.  Pt will be able to do household chores with 50% greater ease Baseline: unable to clean tub and perform household tasks of sweeping and vacuuming Goal status: INITIAL  4.  Pt will be able to improve DNF supine test by at least 25 seconds to show increase in neck strength Baseline: unable to hold in supie Goal status: INITIAL  5.  Pt will report 3/10 or less with turning head at night in order to have 4 or more hours of restful sleep Baseline: wakes every 2 hours Goal status: INITIAL  6.  FOTO will improve from 51%   to  60%   indicating improved functional mobility.  Baseline: EVAL  51% Goal status: INITIAL   PLAN:  PT FREQUENCY: 1-2x/week  PT DURATION: 6 weeks  PLANNED INTERVENTIONS: Therapeutic exercises, Therapeutic activity,  Neuromuscular re-education, Balance training, Gait training, Patient/Family education, Self Care, Joint mobilization, Vestibular training, Dry Needling, Electrical stimulation, Spinal mobilization, Cryotherapy, Moist heat, Taping, Manual therapy, and Re-evaluation  PLAN FOR NEXT SESSION:  assess appropriateness of TPDN and manual for symptom modification PRN. Continue to work on cervical mobility, cervical/periscapular endurance while minimizing compensations.   Lisette Mancebo PT, DPT, LAT, ATC  12/21/22  11:54 AM

## 2022-12-22 ENCOUNTER — Inpatient Hospital Stay: Payer: 59 | Attending: Nurse Practitioner

## 2022-12-22 DIAGNOSIS — E538 Deficiency of other specified B group vitamins: Secondary | ICD-10-CM | POA: Insufficient documentation

## 2022-12-22 DIAGNOSIS — D508 Other iron deficiency anemias: Secondary | ICD-10-CM

## 2022-12-22 MED ORDER — CYANOCOBALAMIN 1000 MCG/ML IJ SOLN
1000.0000 ug | INTRAMUSCULAR | Status: DC
  Administered 2022-12-22: 1000 ug via INTRAMUSCULAR
  Filled 2022-12-22: qty 1

## 2022-12-23 ENCOUNTER — Ambulatory Visit: Payer: 59 | Admitting: Physical Therapy

## 2022-12-23 ENCOUNTER — Encounter: Payer: Self-pay | Admitting: Physical Therapy

## 2022-12-23 DIAGNOSIS — M542 Cervicalgia: Secondary | ICD-10-CM

## 2022-12-23 DIAGNOSIS — G8929 Other chronic pain: Secondary | ICD-10-CM | POA: Diagnosis not present

## 2022-12-23 DIAGNOSIS — M25512 Pain in left shoulder: Secondary | ICD-10-CM | POA: Diagnosis not present

## 2022-12-23 DIAGNOSIS — M6281 Muscle weakness (generalized): Secondary | ICD-10-CM | POA: Diagnosis not present

## 2022-12-23 DIAGNOSIS — R293 Abnormal posture: Secondary | ICD-10-CM | POA: Diagnosis not present

## 2022-12-23 NOTE — Patient Instructions (Addendum)
Sleeping on Back  Place pillow under knees. A pillow with cervical support and a roll around waist are also helpful. Copyright  VHI. All rights reserved.  Sleeping on Side Place pillow between knees. Use cervical support under neck and a roll around waist as needed. Copyright  VHI. All rights reserved.   Sleeping on Stomach   If this is the only desirable sleeping position, place pillow under lower legs, and under stomach or chest as needed.  Posture - Sitting   Sit upright, head facing forward. Try using a roll to support lower back. Keep shoulders relaxed, and avoid rounded back. Keep hips level with knees. Avoid crossing legs for long periods. Stand to Sit / Sit to Stand   To sit: Bend knees to lower self onto front edge of chair, then scoot back on seat. To stand: Reverse sequence by placing one foot forward, and scoot to front of seat. Use rocking motion to stand up.   Work Height and Reach  Ideal work height is no more than 2 to 4 inches below elbow level when standing, and at elbow level when sitting. Reaching should be limited to arm's length, with elbows slightly bent.  Bending  Bend at hips and knees, not back. Keep feet shoulder-width apart.    Posture - Standing   Good posture is important. Avoid slouching and forward head thrust. Maintain curve in low back and align ears over shoul- ders, hips over ankles.  Alternating Positions   Alternate tasks and change positions frequently to reduce fatigue and muscle tension. Take rest breaks. Computer Work   Position work to Art gallery manager. Use proper work and seat height. Keep shoulders back and down, wrists straight, and elbows at right angles. Use chair that provides full back support. Add footrest and lumbar roll as needed.  Getting Into / Out of Car  Lower self onto seat, scoot back, then bring in one leg at a time. Reverse sequence to get out.  Dressing  Lie on back to pull socks or slacks over feet, or sit  and bend leg while keeping back straight.    Housework - Sink  Place one foot on ledge of cabinet under sink when standing at sink for prolonged periods.   Pushing / Pulling  Pushing is preferable to pulling. Keep back in proper alignment, and use leg muscles to do the work.  Deep Squat   Squat and lift with both arms held against upper trunk. Tighten stomach muscles without holding breath. Use smooth movements to avoid jerking.  Avoid Twisting   Avoid twisting or bending back. Pivot around using foot movements, and bend at knees if needed when reaching for articles.  Carrying Luggage   Distribute weight evenly on both sides. Use a cart whenever possible. Do not twist trunk. Move body as a unit.   Lifting Principles Maintain proper posture and head alignment. Slide object as close as possible before lifting. Move obstacles out of the way. Test before lifting; ask for help if too heavy. Tighten stomach muscles without holding breath. Use smooth movements; do not jerk. Use legs to do the work, and pivot with feet. Distribute the work load symmetrically and close to the center of trunk. Push instead of pull whenever possible.   Ask For Help   Ask for help and delegate to others when possible. Coordinate your movements when lifting together, and maintain the low back curve.  Log Roll   Lying on back, bend left knee and place left  arm across chest. Roll all in one movement to the right. Reverse to roll to the left. Always move as one unit. Housework - Sweeping  Use long-handled equipment to avoid stooping.   Housework - Wiping  Position yourself as close as possible to reach work surface. Avoid straining your back.  Laundry - Unloading Wash   To unload small items at bottom of washer, lift leg opposite to arm being used to reach.  Gardening - Raking  Move close to area to be raked. Use arm movements to do the work. Keep back straight and avoid twisting.      Cart  When reaching into cart with one arm, lift opposite leg to keep back straight.   Getting Into / Out of Bed  Lower self to lie down on one side by raising legs and lowering head at the same time. Use arms to assist moving without twisting. Bend both knees to roll onto back if desired. To sit up, start from lying on side, and use same move-ments in reverse. Housework - Vacuuming  Hold the vacuum with arm held at side. Step back and forth to move it, keeping head up. Avoid twisting.   Laundry - Armed forces training and education officer so that bending and twisting can be avoided.   Laundry - Unloading Dryer  Squat down to reach into clothes dryer or use a reacher.  Gardening - Weeding / Psychiatric nurse or Kneel. Knee pads may be helpful.                    Over Head Pull: Narrow Grip       On back, knees bent, feet flat, band across thighs, elbows straight but relaxed. Pull hands apart (start). Keeping elbows straight, bring arms up and over head, hands toward floor. Keep pull steady on band. Hold momentarily. Return slowly, keeping pull steady, back to start. Repeat ___ times. Band color ______   Side Pull: Double Arm   On back, knees bent, feet flat. Arms perpendicular to body, shoulder level, elbows straight but relaxed. Pull arms out to sides, elbows straight. Resistance band comes across collarbones, hands toward floor. Hold momentarily. Slowly return to starting position. Repeat ___ times. Band color _____   Sash   On back, knees bent, feet flat, left hand on left hip, right hand above left. Pull right arm DIAGONALLY (hip to shoulder) across chest. Bring right arm along head toward floor. Hold momentarily. Slowly return to starting position. Repeat ___ times. Do with left arm. Band color ______   Shoulder Rotation: Double Arm   On back, knees bent, feet flat, elbows tucked at sides, bent 90, hands palms up. Pull hands apart and down toward floor,  keeping elbows near sides. Hold momentarily. Slowly return to starting position. Repeat ___ times. Band color ______    Garen Lah, PT, ATRIC Certified Exercise Expert for the Aging Adult  12/23/22 11:10 AM Phone: [phone] Fax: [phone]

## 2022-12-23 NOTE — Therapy (Signed)
OUTPATIENT PHYSICAL THERAPY TREATMENT NOTE   Patient Name: Melissa Gaines MRN: [id number] DOB:23-May-1970, 52 y.o., female Today's Date: 12/23/2022  END OF SESSION:  PT End of Session - 12/23/22 1102     Visit Number 5    Number of Visits 12    Date for PT Re-Evaluation 01/06/23    Authorization Type [employer] MCR    PT Start Time 1103    PT Stop Time 1144    PT Time Calculation (min) 41 min    Activity Tolerance Patient tolerated treatment well;No increased pain    Behavior During Therapy [facility] for tasks assessed/performed                 Past Medical History:  Diagnosis Date   Anemia    Anxiety    Asthma    Chronic pain    Congenital hip dislocation    GERD (gastroesophageal reflux disease)    Iron deficiency anemia 04/14/2018   Kidney mass 2017   Migraine    Osteoporosis    Pathological dislocation of shoulder joint, bilateral    congential   PTSD (post-traumatic stress disorder)    Sleep apnea    Systemic lupus erythematosus (HCC)    Urticaria    Past Surgical History:  Procedure Laterality Date   CESAREAN SECTION  2005   CESAREAN SECTION W/BTL  2010   HIP SURGERY     in childhood for congenital hip dislocation   LAPAROSCOPIC GASTRIC SLEEVE RESECTION  2014   STAPEDECTOMY  11/01/2011   L postauricular stapedectomy with CO2 laser and insertion of 6 x 4.75 mm SMart Piston   TOTAL HIP ARTHROPLASTY     05/2000 R, 11/2000 L   Patient Active Problem List   Diagnosis Date Noted   B12 deficiency 05/11/2022   Hyperlipidemia 05/07/2022   Prediabetes 05/07/2022   Environmental and seasonal allergies 08/03/2021   History of colonoscopy 07/31/2021   Asthma 07/01/2021   Gastroesophageal reflux disease 08/19/2020   Tinnitus aurium, 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   Lumbar back pain 01/25/2020   Left hip pain 06/26/2019   Muscle strain of lower extremity, left, initial encounter 06/26/2019   Healthcare maintenance 06/26/2019   Iron deficiency anemia 04/14/2018    Hematuria, gross 03/29/2018   Iron deficiency anemia due to chronic blood loss 03/29/2018   Long term current use of anticoagulant 03/29/2018   Antiphospholipid antibody syndrome (HCC) 01/25/2018   Morbid obesity (HCC) 07/26/2017   Systemic lupus erythematosus (HCC) 07/08/2017   Hx pulmonary embolism 07/08/2017   Chronic pain of right knee 05/23/2017   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/21/2016   Vitamin D deficiency 12/16/2016   Anemia    Migraine     PCP: Levin Erp, MD   REFERRING PROVIDER: Drema Dallas, DO   REFERRING DIAG: M54.2 (ICD-10-CM) - Cervicalgia   THERAPY DIAG:  Cervicalgia  Muscle weakness (generalized)  Abnormal posture  Chronic left shoulder pain  Rationale for Evaluation and Treatment: Rehabilitation  ONSET DATE: August 2024   SUBJECTIVE:  Per eval - I was helping a friend out moving and later I noticed when I woke up the next day, I had neck pain. I thought I might have slept the wrong way. Stated on my left side of my neck and now it is down the middle. When I turn to the R and the L , I hear my neck clicking. When I bend my neck all the way down , I feel dizzy.  I don't drive because of disability with dislocated hips since childbirth.  My shoulders give me problems as well.   I also have headaches daily but mild.  Bright lights are hard for me, I have tingling down both arms and both legs910   Hand dominance: Right  SUBJECTIVE STATEMENT: 12/23/2022   the needling helped a little bit but I had sweep the whole floor yesterday and I had to take care of my animals, cats and dog.  I take the dog a walk around the block.  It takes me about 5 minutes.   Pain is not pain  it is more tense and tight but it is always there. I cannot give you a number.. I am not sure how to  use the two tennis balls at home On my neck.  PERTINENT HISTORY:  Lupus, congenital hip dislocation, migraines,Pathological dislocation of shld joint bil, PTSD, Sleep apnea GERD, Asthma, Osteoporois  PAIN:  Are you having pain? Yes: NPRS scale: 8/10 Pain location: BIL neck pain L > R and down the middle Pain description: sharp pain,,pounding Aggravating factors: when I have to turn my head, sweeping and mopping, washing the tub Relieving factors: laying down  Sleeping is hard because I have pain turning my neck PRECAUTIONS: None  RED FLAGS: None     WEIGHT BEARING RESTRICTIONS: No  FALLS:  Has patient fallen in last 6 months? No  LIVING ENVIRONMENT: Lives with: lives with their family Lives in: House/apartment Stairs: Yes: External: 1 steps; none Has following equipment at home: None  OCCUPATION: On disability  PLOF: Independent  PATIENT GOALS: My neck to feel normal and be able to turn  NEXT MD VISIT: in September late  OBJECTIVE: (objective measures completed at initial evaluation unless otherwise dated)   DIAGNOSTIC FINDINGS:  No recent  PATIENT SURVEYS:  FOTO 51 % predicted 60% 12-23-22 63%  COGNITION: Overall cognitive status: Within functional limits for tasks assessed  SENSATION: Pt reports bil tingling in bil UE and LE  POSTURE: rounded shoulders, forward head, and flexed trunk  obesity  PALPATION: TTP over bil cervical paraspinals and UT  Left > Right   CERVICAL ROM:   Active ROM A/PROM (deg) eval AROM 12-23-22  Flexion 30   Extension 38   Right lateral flexion 22   Left lateral flexion 19   Right rotation 35 59  Left rotation 40 58   (Blank rows = not tested)  UPPER EXTREMITY ROM:  Active ROM Right eval Left eval  Shoulder flexion 105 130  Shoulder extension    Shoulder abduction 105 110  Shoulder adduction    Shoulder extension    Shoulder internal rotation    Shoulder external rotation    Elbow flexion    Elbow extension     Wrist flexion    Wrist extension    Wrist ulnar deviation    Wrist radial deviation    Wrist pronation    Wrist supination     (Blank rows = not tested)  UPPER EXTREMITY MMT:  Pt with  global tightness.  MMT Right eval Left eval  Shoulder flexion 3+ 4  Shoulder extension    Shoulder abduction 3+ 4  Shoulder adduction    Shoulder extension    Shoulder internal rotation    Shoulder external rotation    Middle trapezius 4- 4-  Lower trapezius 3+ 3+  Elbow flexion    Elbow extension    Wrist flexion    Wrist extension    Wrist ulnar deviation    Wrist radial deviation    Wrist pronation    Wrist supination    Grip strength 30 lb 32 lb   (Blank rows = not tested)  CERVICAL SPECIAL TESTS:  DNF test  can only hold in supone 5 sec.  Pt with increased sensitivity with palpation at subocciptals and bil cervical paraspinals  FUNCTIONAL TESTS:  5 times sit to stand: 14.98   TODAY'S TREATMENT:  [facility] Adult PT Treatment:                                                DATE: 12-23-22 FOTO 63% Therapeutic Exercise: UBE  2.5 min forward and 2.5 min backward level 1 Supine scapular stabilizers.with overhead ull narrow grip x 10 RTB VC and TC Horizontal abduction x 10 with RTB  VC and TC Diagonals x 10 on R and L RTB VC and TC Shoulder ER supine x 10 RTB VC and TC  Self Care: Posture and body mechanics  Use of two tennis balls for  for self trigger point release and return demo Explanation of need for regular movement throughout the day to build endurance for tasks. [facility] Adult PT Treatment:                                                DATE: 12/21/22 Therapeutic Exercise: Supine chin tuck 1 x 10 holding 10 sec Supine scapular retraction 1 x 10 holding 5 seconds Upper trap / Levator scapulae stretch 1 x 30 sec on L only Upper cervical rotation.  Manual Therapy: Sub-occipital release- taught how to perform at home.  L upper trap/ levator scpuale stretch Tack and stretch of  the sub-occipitals Self Care: Reviewed posture and effects of rolling shoulders and benefit of replacing rolling with stretching to reduce the upper trap from becoming over activated.    [facility] Adult PT Treatment:                                                DATE: 12-09-22 Therapeutic Exercise: Supine DNF -10 reps - 5-10 sec hold Seated Cervical Retraction Protraction AROM1 sets - 10 reps VC for stacking posture correctly and not sitting on tailbone.  Seated UT Stretch  3 reps - 30 hold Levator Scapulae Stretch  -3 reps - 30 hold Standing alternating wall slides using wash cloths 1 x 15 eah  Shoulder External Rotation and Scapular Retraction with RTB  3 x 10 Manual Therapy: Gentle cervical distraction Suboccipital release Scalenes Myofascial release Thoracic extension with PA grade II mobs for comfort  Trigger Point Dry-Needling performed     by Garen Lah  Treatment instructions: Expect mild to moderate muscle soreness. S/S of pneumothorax if dry needled over a lung field, and to seek immediate medical attention should they occur. Patient verbalized understanding of these instructions and education.  Patient Consent Given: Yes Education handout provided: Previously provided Muscles treated:  Electrical stimulation performed: No Parameters: N/A Treatment response/outcome: twitch response noted, pt noted relief                                                                                                                           [facility] Adult PT Treatment:                                                DATE: 12/07/22 Therapeutic Exercise: Seated chin tuck x8 cues for reduced UT compensations Seated UT stretch 2x30sec to R Seated LS stretch 2x30sec to R  Seated double ER + scapular retraction 2x10 cues for positioning  Supine cervical retraction x10 use of pillows as external cue to reduce compensations at UT and T spine Supine cervical rotation bilaterally x8 cues for  comfortable ROM seated horizontal abduction, red band x10 cues for positioning and reduced compensations at UT Verbal HEP review + education   PATIENT EDUCATION:  Education details: rationale for interventions, HEP Person educated: Patient Education method: Explanation, Demonstration, Tactile cues, Verbal cues, and Handouts Education comprehension: verbalized understanding, returned demonstration, verbal cues required, tactile cues required, and needs further education  HOME EXERCISE PROGRAM: Access Code: [email or web address] URL: [email or web address] Date: 11/25/2022 Prepared by: Garen Lah  Exercises - Supine Deep Neck Flexor Training  - 2-3 x daily - 7 x weekly - 10 reps - 3 hold - Seated Cervical Retraction Protraction AROM  - 2-3 x daily - 7 x weekly - 1 sets - 10 reps - Seated Gentle Upper Trapezius Stretch  - 1 x daily - 7 x weekly - 1 sets - 3 reps - 30 hold - Gentle Levator Scapulae Stretch  - 1 x daily - 7 x weekly - 3 sets - 3 reps - 30 hold - Shoulder External Rotation and Scapular Retraction with Resistance  - 1-2 x daily - 7 x weekly - 3 sets - 10 reps - 3-5 sec hold Added supine scapular stabilizer exercise handout  ASSESSMENT:  CLINICAL IMPRESSION: 12/23/2022 Pt arrives to session continuing to report pain but unable to designate any number saying she feels tight and discomfort more than pain.  Pt FOTO improved to 63% and AROM of cervical rotation much improved but pt still states she feels tight. She reports she still gets a lot of cracking in her neck that occurs a number of times a day.  Pt told to focus on strengthening and movement and to encourage walking for endurance to and time her walks.  Not just to include for household chores where she takes multiple  breaks.  LTG # 2 and 6 met and # 3 partially met. Reinforced use of tennis ball for self trigger point release and return demo. Focused on manual trigger point release and ways it can be done at home to  help bridge the treatment done in the clinic.  Pt stated she felt better but did not specify any number and stated she did not know why we need a number for pain assessment.  She just feels discomfort.  Will continue with remaining visits to reinforce HEP for home use. Educated on body mechanics and posture.  Per eval - Patient is a 52 y.o. female  who was seen today for physical therapy evaluation and treatment for cervicalgia and and musculoskeletal strain after helping a friend move a month ago.  Pt complains of pain and difficulty turning her neck during day for sweeping and vacuuming and while sleeping at night.  Pt also has SLE and congenital hip dislocations/shoulder dislocations and subsequent muscle weakness.  Pt will benefit from skilled PT to address impairments that keep her from daily activities. Pt currently is not participating in any regular exercise. Will progress toward completion of goals.  OBJECTIVE IMPAIRMENTS: decreased activity tolerance, decreased ROM, decreased strength, dizziness, impaired sensation, impaired UE functional use, improper body mechanics, postural dysfunction, obesity, and pain.   ACTIVITY LIMITATIONS: carrying, lifting, sleeping, and reach over head  PARTICIPATION LIMITATIONS: meal prep, cleaning, and laundry  PERSONAL FACTORS: Lupus, congenital hip dislocation, migraines,Pathological dislocation of shld joint bil, PTSD, Sleep apnea GERD, Asthma, Osteoporois are also affecting patient's functional outcome.   REHAB POTENTIAL: Good for cervical  Fair for shoulders and hips due to congenital issues and dislocations  CLINICAL DECISION MAKING: Evolving/moderate complexity  EVALUATION COMPLEXITY: Moderate   GOALS: Goals reviewed with patient? Yes  SHORT TERM GOALS: Target date: 11-16-22  Pt will be independent with initial HEP Baseline:  12-09-22  pt still needs guidance with HEP Goal status: ONGOING  2.  Pt pain  will reduce to at least 6/10 from 9/10  with turning neck Baseline: See AROM chart 12-09-22  Pt 7/10 and at end 3-4/10 pain Goal status: MET  3.  Demonstrate understanding of proper sitting posture, body mechanics, work ergonomics, and be more conscious of position and posture throughout the day.  Baseline:   Goal status: INITIAL   Goal status: INITIAL  LONG TERM GOALS: Target date: 01-06-23  Pt will be independent with advanced HEP Baseline: no knowledge 12-23-22  pt given supine strengthening with sub scapular stabilizers Goal status: ONGOING  2.  Pt will be able to turn neck to 45 degrees minimally to improve environmental scanning Baseline: See AROM chart 12-23-22  /see AROM for cervical rotation Goal status: MET  3.  Pt will be able to do household chores with 50% greater ease Baseline: unable to clean tub and perform household tasks of sweeping and vacuuming 12-23-22  can clean one to two rooms  and son does tub for me Goal status: Partially met  4.  Pt will be able to improve DNF supine test by at least 25 seconds to show increase in neck strength Baseline: unable to hold in supine Goal status: INITIAL  5.  Pt will report 3/10 or less with turning head at night in order to have 4 or more hours of restful sleep Baseline: wakes every 2 hours Goal status: INITIAL  6.  FOTO will improve from 51%   to  60%   indicating improved functional mobility.  Baseline:  EVAL  51% 12-23-22 63% Goal status:MET   PLAN:  PT FREQUENCY: 1-2x/week  PT DURATION: 6 weeks  PLANNED INTERVENTIONS: Therapeutic exercises, Therapeutic activity, Neuromuscular re-education, Balance training, Gait training, Patient/Family education, Self Care, Joint mobilization, Vestibular training, Dry Needling, Electrical stimulation, Spinal mobilization, Cryotherapy, Moist heat, Taping, Manual therapy, and Re-evaluation  PLAN FOR NEXT SESSION:  assess appropriateness of TPDN and manual for symptom modification PRN. Continue to work on cervical  mobility, cervical/periscapular endurance while minimizing compensations.   Garen Lah, PT, [facility] Certified Exercise Expert for the Aging Adult  12/23/22 12:55 PM Phone: [phone] Fax: [phone]

## 2022-12-25 ENCOUNTER — Other Ambulatory Visit: Payer: Self-pay | Admitting: Student

## 2022-12-25 DIAGNOSIS — D6861 Antiphospholipid syndrome: Secondary | ICD-10-CM

## 2022-12-26 ENCOUNTER — Other Ambulatory Visit: Payer: Self-pay | Admitting: Family Medicine

## 2022-12-26 DIAGNOSIS — E7800 Pure hypercholesterolemia, unspecified: Secondary | ICD-10-CM

## 2022-12-28 ENCOUNTER — Ambulatory Visit: Payer: 59 | Admitting: Physical Therapy

## 2022-12-28 ENCOUNTER — Encounter: Payer: Self-pay | Admitting: Physical Therapy

## 2022-12-28 DIAGNOSIS — M542 Cervicalgia: Secondary | ICD-10-CM

## 2022-12-28 DIAGNOSIS — R293 Abnormal posture: Secondary | ICD-10-CM | POA: Diagnosis not present

## 2022-12-28 DIAGNOSIS — M25512 Pain in left shoulder: Secondary | ICD-10-CM | POA: Diagnosis not present

## 2022-12-28 DIAGNOSIS — G8929 Other chronic pain: Secondary | ICD-10-CM | POA: Diagnosis not present

## 2022-12-28 DIAGNOSIS — M6281 Muscle weakness (generalized): Secondary | ICD-10-CM | POA: Diagnosis not present

## 2022-12-28 NOTE — Therapy (Signed)
OUTPATIENT PHYSICAL THERAPY TREATMENT NOTE   Patient Name: Melissa Gaines MRN: [id number] DOB:Dec 08, 1970, 52 y.o., female Today's Date: 12/28/2022  END OF SESSION:  PT End of Session - 12/28/22 1032     Visit Number 6    Number of Visits 12    Date for PT Re-Evaluation 01/06/23    Authorization Type [employer] MCR    PT Start Time 1035    PT Stop Time 1129    PT Time Calculation (min) 54 min                 Past Medical History:  Diagnosis Date   Anemia    Anxiety    Asthma    Chronic pain    Congenital hip dislocation    GERD (gastroesophageal reflux disease)    Iron deficiency anemia 04/14/2018   Kidney mass 2017   Migraine    Osteoporosis    Pathological dislocation of shoulder joint, bilateral    congential   PTSD (post-traumatic stress disorder)    Sleep apnea    Systemic lupus erythematosus (HCC)    Urticaria    Past Surgical History:  Procedure Laterality Date   CESAREAN SECTION  2005   CESAREAN SECTION W/BTL  2010   HIP SURGERY     in childhood for congenital hip dislocation   LAPAROSCOPIC GASTRIC SLEEVE RESECTION  2014   STAPEDECTOMY  11/01/2011   L postauricular stapedectomy with CO2 laser and insertion of 6 x 4.75 mm SMart Piston   TOTAL HIP ARTHROPLASTY     05/2000 R, 11/2000 L   Patient Active Problem List   Diagnosis Date Noted   B12 deficiency 05/11/2022   Hyperlipidemia 05/07/2022   Prediabetes 05/07/2022   Environmental and seasonal allergies 08/03/2021   History of colonoscopy 07/31/2021   Asthma 07/01/2021   Gastroesophageal reflux disease 08/19/2020   Tinnitus aurium, 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   Lumbar back pain 01/25/2020   Left hip pain 06/26/2019   Muscle strain of lower extremity, left, initial encounter 06/26/2019   Healthcare maintenance 06/26/2019   Iron deficiency anemia 04/14/2018   Hematuria, gross 03/29/2018   Iron deficiency anemia due to chronic blood loss 03/29/2018   Long term current use of anticoagulant  03/29/2018   Antiphospholipid antibody syndrome (HCC) 01/25/2018   Morbid obesity (HCC) 07/26/2017   Systemic lupus erythematosus (HCC) 07/08/2017   Hx pulmonary embolism 07/08/2017   Chronic pain of right knee 05/23/2017   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/21/2016   Vitamin D deficiency 12/16/2016   Anemia    Migraine     PCP: Levin Erp, MD   REFERRING PROVIDER: Drema Dallas, DO   REFERRING DIAG: M54.2 (ICD-10-CM) - Cervicalgia   THERAPY DIAG:  Cervicalgia  Muscle weakness (generalized)  Rationale for Evaluation and Treatment: Rehabilitation  ONSET DATE: August 2024   SUBJECTIVE:  Per eval - I was helping a friend out moving and later I noticed when I woke up the next day, I had neck pain. I thought I might have slept the wrong way. Stated on my left side of my neck and now it is down the middle. When I turn to the R and the L , I hear my neck clicking. When I bend my neck all the way down , I feel dizzy.  I don't drive because of disability with dislocated hips since childbirth.  My shoulders give me problems as well.   I also have headaches daily but mild.  Bright lights are hard for me, I have tingling down both arms and both legs910   Hand dominance: Right  SUBJECTIVE STATEMENT: 12/28/2022   My  pain is mostly at night time and in the morning. I do not have pain much during the day. I sleep on stomach mostly or side.    PERTINENT HISTORY:  Lupus, congenital hip dislocation, migraines,Pathological dislocation of shld joint bil, PTSD, Sleep apnea GERD, Asthma, Osteoporois  PAIN:  Are you having pain? Yes: NPRS scale: 0/10 Pain location: BIL neck pain L > R and down the middle Pain description: sharp pain,,pounding Aggravating factors: when I have to turn my head, sweeping and  mopping, washing the tub Relieving factors: laying down  Sleeping is hard because I have pain turning my neck PRECAUTIONS: None  RED FLAGS: None     WEIGHT BEARING RESTRICTIONS: No  FALLS:  Has patient fallen in last 6 months? No  LIVING ENVIRONMENT: Lives with: lives with their family Lives in: House/apartment Stairs: Yes: External: 1 steps; none Has following equipment at home: None  OCCUPATION: On disability  PLOF: Independent  PATIENT GOALS: My neck to feel normal and be able to turn  NEXT MD VISIT: in September late  OBJECTIVE: (objective measures completed at initial evaluation unless otherwise dated)   DIAGNOSTIC FINDINGS:  No recent  PATIENT SURVEYS:  FOTO 51 % predicted 60% 12-23-22 63%  COGNITION: Overall cognitive status: Within functional limits for tasks assessed  SENSATION: Pt reports bil tingling in bil UE and LE  POSTURE: rounded shoulders, forward head, and flexed trunk  obesity  PALPATION: TTP over bil cervical paraspinals and UT  Left > Right   CERVICAL ROM:   Active ROM A/PROM (deg) eval AROM 12-23-22  Flexion 30   Extension 38   Right lateral flexion 22   Left lateral flexion 19   Right rotation 35 59  Left rotation 40 58   (Blank rows = not tested)  UPPER EXTREMITY ROM:  Active ROM Right eval Left eval  Shoulder flexion 105 130  Shoulder extension    Shoulder abduction 105 110  Shoulder adduction    Shoulder extension    Shoulder internal rotation    Shoulder external rotation    Elbow flexion    Elbow extension    Wrist flexion    Wrist extension    Wrist ulnar deviation    Wrist radial deviation    Wrist pronation    Wrist supination     (Blank rows = not tested)  UPPER EXTREMITY MMT:  Pt with global tightness.  MMT Right eval Left eval  Shoulder flexion 3+ 4  Shoulder extension    Shoulder abduction 3+ 4  Shoulder adduction    Shoulder extension    Shoulder internal rotation    Shoulder external  rotation    Middle trapezius 4- 4-  Lower trapezius  3+ 3+  Elbow flexion    Elbow extension    Wrist flexion    Wrist extension    Wrist ulnar deviation    Wrist radial deviation    Wrist pronation    Wrist supination    Grip strength 30 lb 32 lb   (Blank rows = not tested)  CERVICAL SPECIAL TESTS:  DNF test  can only hold in supone 5 sec. 12/28/22: DNF 53 sec   Pt with increased sensitivity with palpation at subocciptals and bil cervical paraspinals  FUNCTIONAL TESTS:  5 times sit to stand: 14.98   TODAY'S TREATMENT:  [facility] Adult PT Treatment:                                                DATE: 12/28/22 Therapeutic Exercise: UBE 5 minutes level 1  Seated UT/levator stretches  Seated chin tuck SL/ open books x 8 each  DNF endurance 53 sec Supine green band horiz abdct  GTB Supine Alt diagonals GTB Supine ER bilat GTB Narrow grip pullovers GTB  Manual Therapy: Sub occipital release, cervical distraction, Passive UT and cervical rotation  Modalities: HMP neck and back x 15 minutes     [facility] Adult PT Treatment:                                                DATE: 12-23-22 FOTO 63% Therapeutic Exercise: UBE  2.5 min forward and 2.5 min backward level 1 Supine scapular stabilizers.with overhead ull narrow grip x 10 RTB VC and TC Horizontal abduction x 10 with RTB  VC and TC Diagonals x 10 on R and L RTB VC and TC Shoulder ER supine x 10 RTB VC and TC  Self Care: Posture and body mechanics  Use of two tennis balls for  for self trigger point release and return demo Explanation of need for regular movement throughout the day to build endurance for tasks.  [facility] Adult PT Treatment:                                                DATE: 12/21/22 Therapeutic Exercise: Supine chin tuck 1 x 10 holding 10 sec Supine scapular retraction 1 x 10 holding 5 seconds Upper trap / Levator scapulae stretch 1 x 30 sec on L only Upper cervical rotation Manual  Therapy: Sub-occipital release- taught how to perform at home.  L upper trap/ levator scpuale stretch Tack and stretch of the sub-occipitals Self Care: Reviewed posture and effects of rolling shoulders and benefit of replacing rolling with stretching to reduce the upper trap from becoming over activated.    [facility] Adult PT Treatment:                                                DATE: 12-09-22 Therapeutic Exercise: Supine DNF -10 reps - 5-10 sec hold Seated Cervical Retraction Protraction AROM1 sets - 10 reps VC for stacking posture correctly and not sitting  on tailbone.  Seated UT Stretch  3 reps - 30 hold Levator Scapulae Stretch  -3 reps - 30 hold Standing alternating wall slides using wash cloths 1 x 15 eah  Shoulder External Rotation and Scapular Retraction with RTB  3 x 10 Manual Therapy: Gentle cervical distraction Suboccipital release Scalenes Myofascial release Thoracic extension with PA grade II mobs for comfort  Trigger Point Dry-Needling performed     by Garen Lah Treatment instructions: Expect mild to moderate muscle soreness. S/S of pneumothorax if dry needled over a lung field, and to seek immediate medical attention should they occur. Patient verbalized understanding of these instructions and education.  Patient Consent Given: Yes Education handout provided: Previously provided Muscles treated:  Electrical stimulation performed: No Parameters: N/A Treatment response/outcome: twitch response noted, pt noted relief                                                                                                                           [facility] Adult PT Treatment:                                                DATE: 12/07/22 Therapeutic Exercise: Seated chin tuck x8 cues for reduced UT compensations Seated UT stretch 2x30sec to R Seated LS stretch 2x30sec to R  Seated double ER + scapular retraction 2x10 cues for positioning  Supine cervical retraction x10 use of  pillows as external cue to reduce compensations at UT and T spine Supine cervical rotation bilaterally x8 cues for comfortable ROM seated horizontal abduction, red band x10 cues for positioning and reduced compensations at UT Verbal HEP review + education   PATIENT EDUCATION:  Education details: rationale for interventions, HEP Person educated: Patient Education method: Explanation, Demonstration, Tactile cues, Verbal cues, and Handouts Education comprehension: verbalized understanding, returned demonstration, verbal cues required, tactile cues required, and needs further education  HOME EXERCISE PROGRAM: Access Code: [email or web address] URL: [email or web address] Date: 11/25/2022 Prepared by: Garen Lah  Exercises - Supine Deep Neck Flexor Training  - 2-3 x daily - 7 x weekly - 10 reps - 3 hold - Seated Cervical Retraction Protraction AROM  - 2-3 x daily - 7 x weekly - 1 sets - 10 reps - Seated Gentle Upper Trapezius Stretch  - 1 x daily - 7 x weekly - 1 sets - 3 reps - 30 hold - Gentle Levator Scapulae Stretch  - 1 x daily - 7 x weekly - 3 sets - 3 reps - 30 hold - Shoulder External Rotation and Scapular Retraction with Resistance  - 1-2 x daily - 7 x weekly - 3 sets - 10 reps - 3-5 sec hold Added supine scapular stabilizer exercise handout  ASSESSMENT:  CLINICAL IMPRESSION: 12/28/2022 Pt arrives to session reporting that she does not feel much pain during the day. Her pain is mostly  at night and with first rise. Pt demonstrates significant improvement in DNF endurance test, holding 53 seconds (improved from 5 sec). LTG# 4 met. Will continue to reinforce HEP and complete goals in remaining visits.     Per eval - Patient is a 52 y.o. female  who was seen today for physical therapy evaluation and treatment for cervicalgia and and musculoskeletal strain after helping a friend move a month ago.  Pt complains of pain and difficulty turning her neck during day for sweeping and  vacuuming and while sleeping at night.  Pt also has SLE and congenital hip dislocations/shoulder dislocations and subsequent muscle weakness.  Pt will benefit from skilled PT to address impairments that keep her from daily activities. Pt currently is not participating in any regular exercise. Will progress toward completion of goals.  OBJECTIVE IMPAIRMENTS: decreased activity tolerance, decreased ROM, decreased strength, dizziness, impaired sensation, impaired UE functional use, improper body mechanics, postural dysfunction, obesity, and pain.   ACTIVITY LIMITATIONS: carrying, lifting, sleeping, and reach over head  PARTICIPATION LIMITATIONS: meal prep, cleaning, and laundry  PERSONAL FACTORS: Lupus, congenital hip dislocation, migraines,Pathological dislocation of shld joint bil, PTSD, Sleep apnea GERD, Asthma, Osteoporois are also affecting patient's functional outcome.   REHAB POTENTIAL: Good for cervical  Fair for shoulders and hips due to congenital issues and dislocations  CLINICAL DECISION MAKING: Evolving/moderate complexity  EVALUATION COMPLEXITY: Moderate   GOALS: Goals reviewed with patient? Yes  SHORT TERM GOALS: Target date: 11-16-22  Pt will be independent with initial HEP Baseline:  12-09-22  pt still needs guidance with HEP Goal status: ONGOING  2.  Pt pain  will reduce to at least 6/10 from 9/10 with turning neck Baseline: See AROM chart 12-09-22  Pt 7/10 and at end 3-4/10 pain Goal status: MET  3.  Demonstrate understanding of proper sitting posture, body mechanics, work ergonomics, and be more conscious of position and posture throughout the day.  Baseline:  12/28/22: is being more conscious but has difficulty  Goal status: ONGOING   LONG TERM GOALS: Target date: 01-06-23  Pt will be independent with advanced HEP Baseline: no knowledge 12-23-22  pt given supine strengthening with sub scapular stabilizers Goal status: ONGOING  2.  Pt will be able to turn  neck to 45 degrees minimally to improve environmental scanning Baseline: See AROM chart 12-23-22  /see AROM for cervical rotation Goal status: MET  3.  Pt will be able to do household chores with 50% greater ease Baseline: unable to clean tub and perform household tasks of sweeping and vacuuming 12-23-22  can clean one to two rooms  and son does tub for me Goal status: Partially met  4.  Pt will be able to improve DNF supine test by at least 25 seconds to show increase in neck strength Baseline: unable to hold in supine 12/28/22: 53 sec Goal status: MET  5.  Pt will report 3/10 or less with turning head at night in order to have 4 or more hours of restful sleep Baseline: wakes every 2 hours Goal status: INITIAL  6.  FOTO will improve from 51%   to  60%   indicating improved functional mobility.  Baseline: EVAL  51% 12-23-22 63% Goal status:MET   PLAN:  PT FREQUENCY: 1-2x/week  PT DURATION: 6 weeks  PLANNED INTERVENTIONS: Therapeutic exercises, Therapeutic activity, Neuromuscular re-education, Balance training, Gait training, Patient/Family education, Self Care, Joint mobilization, Vestibular training, Dry Needling, Electrical stimulation, Spinal mobilization, Cryotherapy, Moist heat, Taping, Manual therapy, and  Re-evaluation  PLAN FOR NEXT SESSION:  assess appropriateness of TPDN and manual for symptom modification PRN. Continue to work on cervical mobility, cervical/periscapular endurance while minimizing compensations.   Jannette Spanner, PTA 12/28/22 11:32 AM Phone: [phone] Fax: [phone]

## 2022-12-30 ENCOUNTER — Ambulatory Visit: Payer: 59 | Admitting: Physical Therapy

## 2022-12-30 ENCOUNTER — Encounter: Payer: Self-pay | Admitting: Physical Therapy

## 2022-12-30 DIAGNOSIS — M6281 Muscle weakness (generalized): Secondary | ICD-10-CM | POA: Diagnosis not present

## 2022-12-30 DIAGNOSIS — M25512 Pain in left shoulder: Secondary | ICD-10-CM | POA: Diagnosis not present

## 2022-12-30 DIAGNOSIS — R293 Abnormal posture: Secondary | ICD-10-CM | POA: Diagnosis not present

## 2022-12-30 DIAGNOSIS — M542 Cervicalgia: Secondary | ICD-10-CM

## 2022-12-30 DIAGNOSIS — G8929 Other chronic pain: Secondary | ICD-10-CM | POA: Diagnosis not present

## 2022-12-30 NOTE — Therapy (Signed)
OUTPATIENT PHYSICAL THERAPY TREATMENT NOTE   Patient Name: Melissa Gaines MRN: [id number] DOB:Nov 19, 1970, 52 y.o., female Today's Date: 12/30/2022  END OF SESSION:  PT End of Session - 12/30/22 1101     Visit Number 7    Number of Visits 12    Date for PT Re-Evaluation 01/06/23    Authorization Type [employer] MCR    PT Start Time 1100    PT Stop Time 1145    PT Time Calculation (min) 45 min                 Past Medical History:  Diagnosis Date   Anemia    Anxiety    Asthma    Chronic pain    Congenital hip dislocation    GERD (gastroesophageal reflux disease)    Iron deficiency anemia 04/14/2018   Kidney mass 2017   Migraine    Osteoporosis    Pathological dislocation of shoulder joint, bilateral    congential   PTSD (post-traumatic stress disorder)    Sleep apnea    Systemic lupus erythematosus (HCC)    Urticaria    Past Surgical History:  Procedure Laterality Date   CESAREAN SECTION  2005   CESAREAN SECTION W/BTL  2010   HIP SURGERY     in childhood for congenital hip dislocation   LAPAROSCOPIC GASTRIC SLEEVE RESECTION  2014   STAPEDECTOMY  11/01/2011   L postauricular stapedectomy with CO2 laser and insertion of 6 x 4.75 mm SMart Piston   TOTAL HIP ARTHROPLASTY     05/2000 R, 11/2000 L   Patient Active Problem List   Diagnosis Date Noted   B12 deficiency 05/11/2022   Hyperlipidemia 05/07/2022   Prediabetes 05/07/2022   Environmental and seasonal allergies 08/03/2021   History of colonoscopy 07/31/2021   Asthma 07/01/2021   Gastroesophageal reflux disease 08/19/2020   Tinnitus aurium, 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   Lumbar back pain 01/25/2020   Left hip pain 06/26/2019   Muscle strain of lower extremity, left, initial encounter 06/26/2019   Healthcare maintenance 06/26/2019   Iron deficiency anemia 04/14/2018   Hematuria, gross 03/29/2018   Iron deficiency anemia due to chronic blood loss 03/29/2018   Long term current use of anticoagulant  03/29/2018   Antiphospholipid antibody syndrome (HCC) 01/25/2018   Morbid obesity (HCC) 07/26/2017   Systemic lupus erythematosus (HCC) 07/08/2017   Hx pulmonary embolism 07/08/2017   Chronic pain of right knee 05/23/2017   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/21/2016   Vitamin D deficiency 12/16/2016   Anemia    Migraine     PCP: Levin Erp, MD   REFERRING PROVIDER: Drema Dallas, DO   REFERRING DIAG: M54.2 (ICD-10-CM) - Cervicalgia   THERAPY DIAG:  Cervicalgia  Muscle weakness (generalized)  Rationale for Evaluation and Treatment: Rehabilitation  ONSET DATE: August 2024   SUBJECTIVE:  Per eval - I was helping a friend out moving and later I noticed when I woke up the next day, I had neck pain. I thought I might have slept the wrong way. Stated on my left side of my neck and now it is down the middle. When I turn to the R and the L , I hear my neck clicking. When I bend my neck all the way down , I feel dizzy.  I don't drive because of disability with dislocated hips since childbirth.  My shoulders give me problems as well.   I also have headaches daily but mild.  Bright lights are hard for me, I have tingling down both arms and both legs910   Hand dominance: Right  SUBJECTIVE STATEMENT: 12/30/2022   I am popping and cracking in the neck but it doesn't hurt. Still have difficulty at nigh with sleeping on my stomach.    PERTINENT HISTORY:  Lupus, congenital hip dislocation, migraines,Pathological dislocation of shld joint bil, PTSD, Sleep apnea GERD, Asthma, Osteoporois  PAIN:  Are you having pain? Yes: NPRS scale: 0/10 Pain location: BIL neck pain L > R and down the middle Pain description: sharp pain,,pounding Aggravating factors: when I have to turn my head, sweeping and mopping,  washing the tub Relieving factors: laying down  Sleeping is hard because I have pain turning my neck PRECAUTIONS: None  RED FLAGS: None     WEIGHT BEARING RESTRICTIONS: No  FALLS:  Has patient fallen in last 6 months? No  LIVING ENVIRONMENT: Lives with: lives with their family Lives in: House/apartment Stairs: Yes: External: 1 steps; none Has following equipment at home: None  OCCUPATION: On disability  PLOF: Independent  PATIENT GOALS: My neck to feel normal and be able to turn  NEXT MD VISIT: in September late  OBJECTIVE: (objective measures completed at initial evaluation unless otherwise dated)   DIAGNOSTIC FINDINGS:  No recent  PATIENT SURVEYS:  FOTO 51 % predicted 60% 12-23-22 63%  COGNITION: Overall cognitive status: Within functional limits for tasks assessed  SENSATION: Pt reports bil tingling in bil UE and LE  POSTURE: rounded shoulders, forward head, and flexed trunk  obesity  PALPATION: TTP over bil cervical paraspinals and UT  Left > Right   CERVICAL ROM:   Active ROM A/PROM (deg) eval AROM 12-23-22  Flexion 30   Extension 38   Right lateral flexion 22   Left lateral flexion 19   Right rotation 35 59  Left rotation 40 58   (Blank rows = not tested)  UPPER EXTREMITY ROM:  Active ROM Right eval Left eval  Shoulder flexion 105 130  Shoulder extension    Shoulder abduction 105 110  Shoulder adduction    Shoulder extension    Shoulder internal rotation    Shoulder external rotation    Elbow flexion    Elbow extension    Wrist flexion    Wrist extension    Wrist ulnar deviation    Wrist radial deviation    Wrist pronation    Wrist supination     (Blank rows = not tested)  UPPER EXTREMITY MMT:  Pt with global tightness.  MMT Right eval Left eval  Shoulder flexion 3+ 4  Shoulder extension    Shoulder abduction 3+ 4  Shoulder adduction    Shoulder extension    Shoulder internal rotation    Shoulder external rotation     Middle trapezius 4- 4-  Lower trapezius 3+ 3+  Elbow flexion  Elbow extension    Wrist flexion    Wrist extension    Wrist ulnar deviation    Wrist radial deviation    Wrist pronation    Wrist supination    Grip strength 30 lb 32 lb   (Blank rows = not tested)  CERVICAL SPECIAL TESTS:  DNF test  can only hold in supone 5 sec. 12/28/22: DNF improved to 53 sec   Pt with increased sensitivity with palpation at subocciptals and bil cervical paraspinals  FUNCTIONAL TESTS:  5 times sit to stand: 14.98   TODAY'S TREATMENT:  [facility] Adult PT Treatment:                                                DATE: 12/30/22 Therapeutic Exercise: UBE 5 minutes level 1 Row GTB  x 20  Shoulder Ext red x 15 Seated UT/levator stretches  Seated chin tuck SL/ open books x 8 each  DNF endurance 53 sec Supine green band horiz abdct  GTB x 15  Supine Alt diagonals GTB x 10  Supine ER bilat GTB x15 Narrow grip pullovers GTB x 15  Pec stretch in doorway   Manual Therapy: Sub occipital release, cervical distraction, Passive UT and cervical rotation Therapeutic Activity:  Brief instruction on use of SPC to decrease antalgic pattern and trunk sway with ambulation Discussed asking MD for referral to get cane covered by insurance.    Modalities: HMP neck and back x 15 minutes    [facility] Adult PT Treatment:                                                DATE: 12/28/22 Therapeutic Exercise: UBE 5 minutes level 1  Seated UT/levator stretches  Seated chin tuck SL/ open books x 8 each  DNF endurance 53 sec Supine green band horiz abdct  GTB Supine Alt diagonals GTB Supine ER bilat GTB Narrow grip pullovers GTB  Manual Therapy: Sub occipital release, cervical distraction, Passive UT and cervical rotation  Modalities: HMP neck and back x 15 minutes     [facility] Adult PT Treatment:                                                DATE: 12-23-22 FOTO 63% Therapeutic Exercise: UBE  2.5 min forward  and 2.5 min backward level 1 Supine scapular stabilizers.with overhead ull narrow grip x 10 RTB VC and TC Horizontal abduction x 10 with RTB  VC and TC Diagonals x 10 on R and L RTB VC and TC Shoulder ER supine x 10 RTB VC and TC  Self Care: Posture and body mechanics  Use of two tennis balls for  for self trigger point release and return demo Explanation of need for regular movement throughout the day to build endurance for tasks.  [facility] Adult PT Treatment:  DATE: 12/21/22 Therapeutic Exercise: Supine chin tuck 1 x 10 holding 10 sec Supine scapular retraction 1 x 10 holding 5 seconds Upper trap / Levator scapulae stretch 1 x 30 sec on L only Upper cervical rotation Manual Therapy: Sub-occipital release- taught how to perform at home.  L upper trap/ levator scpuale stretch Tack and stretch of the sub-occipitals Self Care: Reviewed posture and effects of rolling shoulders and benefit of replacing rolling with stretching to reduce the upper trap from becoming over activated.    [facility] Adult PT Treatment:                                                DATE: 12-09-22 Therapeutic Exercise: Supine DNF -10 reps - 5-10 sec hold Seated Cervical Retraction Protraction AROM1 sets - 10 reps VC for stacking posture correctly and not sitting on tailbone.  Seated UT Stretch  3 reps - 30 hold Levator Scapulae Stretch  -3 reps - 30 hold Standing alternating wall slides using wash cloths 1 x 15 eah  Shoulder External Rotation and Scapular Retraction with RTB  3 x 10 Manual Therapy: Gentle cervical distraction Suboccipital release Scalenes Myofascial release Thoracic extension with PA grade II mobs for comfort  Trigger Point Dry-Needling performed     by Garen Lah Treatment instructions: Expect mild to moderate muscle soreness. S/S of pneumothorax if dry needled over a lung field, and to seek immediate medical attention should they occur. Patient  verbalized understanding of these instructions and education.  Patient Consent Given: Yes Education handout provided: Previously provided Muscles treated:  Electrical stimulation performed: No Parameters: N/A Treatment response/outcome: twitch response noted, pt noted relief                                                                                                                           [facility] Adult PT Treatment:                                                DATE: 12/07/22 Therapeutic Exercise: Seated chin tuck x8 cues for reduced UT compensations Seated UT stretch 2x30sec to R Seated LS stretch 2x30sec to R  Seated double ER + scapular retraction 2x10 cues for positioning  Supine cervical retraction x10 use of pillows as external cue to reduce compensations at UT and T spine Supine cervical rotation bilaterally x8 cues for comfortable ROM seated horizontal abduction, red band x10 cues for positioning and reduced compensations at UT Verbal HEP review + education   PATIENT EDUCATION:  Education details: rationale for interventions, HEP Person educated: Patient Education method: Explanation, Demonstration, Tactile cues, Verbal cues, and Handouts Education comprehension: verbalized understanding, returned demonstration, verbal cues required, tactile cues required,  and needs further education  HOME EXERCISE PROGRAM: Access Code: [email or web address] URL: [email or web address] Date: 11/25/2022 Prepared by: Garen Lah  Exercises - Supine Deep Neck Flexor Training  - 2-3 x daily - 7 x weekly - 10 reps - 3 hold - Seated Cervical Retraction Protraction AROM  - 2-3 x daily - 7 x weekly - 1 sets - 10 reps - Seated Gentle Upper Trapezius Stretch  - 1 x daily - 7 x weekly - 1 sets - 3 reps - 30 hold - Gentle Levator Scapulae Stretch  - 1 x daily - 7 x weekly - 3 sets - 3 reps - 30 hold - Shoulder External Rotation and Scapular Retraction with Resistance  - 1-2 x daily - 7 x  weekly - 3 sets - 10 reps - 3-5 sec hold Added supine scapular stabilizer exercise handout  ASSESSMENT:  CLINICAL IMPRESSION: 12/30/2022 Pt arrives to session reporting that she does not feel much pain during the day. She is having some popping in her neck. She did well after last session. Instructed pt in [facility] use to decrease her postural sway and antalgic gait pattern. She does not currently have an AD and it was recommended that she request an order from her primary MD.  Will continue to reinforce HEP and complete goals in remaining visits.     Per eval - Patient is a 52 y.o. female  who was seen today for physical therapy evaluation and treatment for cervicalgia and and musculoskeletal strain after helping a friend move a month ago.  Pt complains of pain and difficulty turning her neck during day for sweeping and vacuuming and while sleeping at night.  Pt also has SLE and congenital hip dislocations/shoulder dislocations and subsequent muscle weakness.  Pt will benefit from skilled PT to address impairments that keep her from daily activities. Pt currently is not participating in any regular exercise. Will progress toward completion of goals.  OBJECTIVE IMPAIRMENTS: decreased activity tolerance, decreased ROM, decreased strength, dizziness, impaired sensation, impaired UE functional use, improper body mechanics, postural dysfunction, obesity, and pain.   ACTIVITY LIMITATIONS: carrying, lifting, sleeping, and reach over head  PARTICIPATION LIMITATIONS: meal prep, cleaning, and laundry  PERSONAL FACTORS: Lupus, congenital hip dislocation, migraines,Pathological dislocation of shld joint bil, PTSD, Sleep apnea GERD, Asthma, Osteoporois are also affecting patient's functional outcome.   REHAB POTENTIAL: Good for cervical  Fair for shoulders and hips due to congenital issues and dislocations  CLINICAL DECISION MAKING: Evolving/moderate complexity  EVALUATION COMPLEXITY:  Moderate   GOALS: Goals reviewed with patient? Yes  SHORT TERM GOALS: Target date: 11-16-22  Pt will be independent with initial HEP Baseline:  12-09-22  pt still needs guidance with HEP Goal status: ONGOING  2.  Pt pain  will reduce to at least 6/10 from 9/10 with turning neck Baseline: See AROM chart 12-09-22  Pt 7/10 and at end 3-4/10 pain Goal status: MET  3.  Demonstrate understanding of proper sitting posture, body mechanics, work ergonomics, and be more conscious of position and posture throughout the day.  Baseline:  12/28/22: is being more conscious but has difficulty  Goal status: ONGOING   LONG TERM GOALS: Target date: 01-06-23  Pt will be independent with advanced HEP Baseline: no knowledge 12-23-22  pt given supine strengthening with sub scapular stabilizers Goal status: ONGOING  2.  Pt will be able to turn neck to 45 degrees minimally to improve environmental scanning Baseline: See AROM chart 12-23-22  /see AROM for cervical rotation Goal status:  MET  3.  Pt will be able to do household chores with 50% greater ease Baseline: unable to clean tub and perform household tasks of sweeping and vacuuming 12-23-22  can clean one to two rooms  and son does tub for me Goal status: Partially met  4.  Pt will be able to improve DNF supine test by at least 25 seconds to show increase in neck strength Baseline: unable to hold in supine 12/28/22: 53 sec Goal status: MET  5.  Pt will report 3/10 or less with turning head at night in order to have 4 or more hours of restful sleep Baseline: wakes every 2 hours Goal status: INITIAL  6.  FOTO will improve from 51%   to  60%   indicating improved functional mobility.  Baseline: EVAL  51% 12-23-22 63% Goal status:MET   PLAN:  PT FREQUENCY: 1-2x/week  PT DURATION: 6 weeks  PLANNED INTERVENTIONS: Therapeutic exercises, Therapeutic activity, Neuromuscular re-education, Balance training, Gait training, Patient/Family  education, Self Care, Joint mobilization, Vestibular training, Dry Needling, Electrical stimulation, Spinal mobilization, Cryotherapy, Moist heat, Taping, Manual therapy, and Re-evaluation  PLAN FOR NEXT SESSION:  assess appropriateness of TPDN and manual for symptom modification PRN. Continue to work on cervical mobility, cervical/periscapular endurance while minimizing compensations.   Jannette Spanner, PTA 12/30/22 11:20 AM Phone: [phone] Fax: [phone]

## 2023-01-04 ENCOUNTER — Encounter: Payer: Self-pay | Admitting: Physical Therapy

## 2023-01-04 ENCOUNTER — Ambulatory Visit: Payer: 59 | Admitting: Physical Therapy

## 2023-01-04 DIAGNOSIS — M542 Cervicalgia: Secondary | ICD-10-CM

## 2023-01-04 DIAGNOSIS — G8929 Other chronic pain: Secondary | ICD-10-CM | POA: Diagnosis not present

## 2023-01-04 DIAGNOSIS — M25512 Pain in left shoulder: Secondary | ICD-10-CM | POA: Diagnosis not present

## 2023-01-04 DIAGNOSIS — R293 Abnormal posture: Secondary | ICD-10-CM | POA: Diagnosis not present

## 2023-01-04 DIAGNOSIS — M6281 Muscle weakness (generalized): Secondary | ICD-10-CM

## 2023-01-04 NOTE — Therapy (Signed)
OUTPATIENT PHYSICAL THERAPY TREATMENT NOTE   Patient Name: Melissa Gaines MRN: [id number] DOB:May 07, 1970, 52 y.o., female Today's Date: 01/04/2023  END OF SESSION:  PT End of Session - 01/04/23 1110     Visit Number 8    Number of Visits 12    Date for PT Re-Evaluation 01/06/23    Authorization Type [employer] MCR    PT Start Time 1100    PT Stop Time 1140    PT Time Calculation (min) 40 min                 Past Medical History:  Diagnosis Date   Anemia    Anxiety    Asthma    Chronic pain    Congenital hip dislocation    GERD (gastroesophageal reflux disease)    Iron deficiency anemia 04/14/2018   Kidney mass 2017   Migraine    Osteoporosis    Pathological dislocation of shoulder joint, bilateral    congential   PTSD (post-traumatic stress disorder)    Sleep apnea    Systemic lupus erythematosus (HCC)    Urticaria    Past Surgical History:  Procedure Laterality Date   CESAREAN SECTION  2005   CESAREAN SECTION W/BTL  2010   HIP SURGERY     in childhood for congenital hip dislocation   LAPAROSCOPIC GASTRIC SLEEVE RESECTION  2014   STAPEDECTOMY  11/01/2011   L postauricular stapedectomy with CO2 laser and insertion of 6 x 4.75 mm SMart Piston   TOTAL HIP ARTHROPLASTY     05/2000 R, 11/2000 L   Patient Active Problem List   Diagnosis Date Noted   B12 deficiency 05/11/2022   Hyperlipidemia 05/07/2022   Prediabetes 05/07/2022   Environmental and seasonal allergies 08/03/2021   History of colonoscopy 07/31/2021   Asthma 07/01/2021   Gastroesophageal reflux disease 08/19/2020   Tinnitus aurium, 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   Lumbar back pain 01/25/2020   Left hip pain 06/26/2019   Muscle strain of lower extremity, left, initial encounter 06/26/2019   Healthcare maintenance 06/26/2019   Iron deficiency anemia 04/14/2018   Hematuria, gross 03/29/2018   Iron deficiency anemia due to chronic blood loss 03/29/2018   Long term current use of anticoagulant  03/29/2018   Antiphospholipid antibody syndrome (HCC) 01/25/2018   Morbid obesity (HCC) 07/26/2017   Systemic lupus erythematosus (HCC) 07/08/2017   Hx pulmonary embolism 07/08/2017   Chronic pain of right knee 05/23/2017   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/21/2016   Vitamin D deficiency 12/16/2016   Anemia    Migraine     PCP: Levin Erp, MD   REFERRING PROVIDER: Drema Dallas, DO   REFERRING DIAG: M54.2 (ICD-10-CM) - Cervicalgia   THERAPY DIAG:  Cervicalgia  Muscle weakness (generalized)  Rationale for Evaluation and Treatment: Rehabilitation  ONSET DATE: August 2024   SUBJECTIVE:  Per eval - I was helping a friend out moving and later I noticed when I woke up the next day, I had neck pain. I thought I might have slept the wrong way. Stated on my left side of my neck and now it is down the middle. When I turn to the R and the L , I hear my neck clicking. When I bend my neck all the way down , I feel dizzy.  I don't drive because of disability with dislocated hips since childbirth.  My shoulders give me problems as well.   I also have headaches daily but mild.  Bright lights are hard for me, I have tingling down both arms and both legs910   Hand dominance: Right  SUBJECTIVE STATEMENT: 01/04/2023   I am popping and cracking in the neck but it doesn't hurt. Still have difficulty at nigh with sleeping on my stomach.    PERTINENT HISTORY:  Lupus, congenital hip dislocation, migraines,Pathological dislocation of shld joint bil, PTSD, Sleep apnea GERD, Asthma, Osteoporois  PAIN:  Are you having pain? Yes: NPRS scale: 0/10 Pain location: BIL neck pain L > R and down the middle Pain description: sharp pain,,pounding Aggravating factors: when I have to turn my head, sweeping and mopping,  washing the tub Relieving factors: laying down  Sleeping is hard because I have pain turning my neck PRECAUTIONS: None  RED FLAGS: None     WEIGHT BEARING RESTRICTIONS: No  FALLS:  Has patient fallen in last 6 months? No  LIVING ENVIRONMENT: Lives with: lives with their family Lives in: House/apartment Stairs: Yes: External: 1 steps; none Has following equipment at home: None  OCCUPATION: On disability  PLOF: Independent  PATIENT GOALS: My neck to feel normal and be able to turn  NEXT MD VISIT: in September late  OBJECTIVE: (objective measures completed at initial evaluation unless otherwise dated)   DIAGNOSTIC FINDINGS:  No recent  PATIENT SURVEYS:  FOTO 51 % predicted 60% 12-23-22 63% MET  COGNITION: Overall cognitive status: Within functional limits for tasks assessed  SENSATION: Pt reports bil tingling in bil UE and LE  POSTURE: rounded shoulders, forward head, and flexed trunk  obesity  PALPATION: TTP over bil cervical paraspinals and UT  Left > Right   CERVICAL ROM:   Active ROM A/PROM (deg) eval AROM 12-23-22 AROM 01/04/23  Flexion 30    Extension 38    Right lateral flexion 22  45  Left lateral flexion 19  48  Right rotation 35 59 65  Left rotation 40 58 65   (Blank rows = not tested)  UPPER EXTREMITY ROM:  Active ROM Right eval Left eval RT/LT 01/04/23  Shoulder flexion 105 130 140/142  Shoulder extension     Shoulder abduction 105 110 150/150  Shoulder adduction     Shoulder extension     Shoulder internal rotation     Shoulder external rotation     Elbow flexion     Elbow extension     Wrist flexion     Wrist extension     Wrist ulnar deviation     Wrist radial deviation     Wrist pronation     Wrist supination      (Blank rows = not tested)  UPPER EXTREMITY MMT:  Pt with global tightness.  MMT Right eval Left eval Right 01/04/23  Shoulder flexion 3+ 4 4  Shoulder extension     Shoulder abduction 3+ 4 4  Shoulder  adduction  Shoulder extension     Shoulder internal rotation     Shoulder external rotation     Middle trapezius 4- 4-   Lower trapezius 3+ 3+   Elbow flexion     Elbow extension     Wrist flexion     Wrist extension     Wrist ulnar deviation     Wrist radial deviation     Wrist pronation     Wrist supination     Grip strength 30 lb 32 lb    (Blank rows = not tested)  CERVICAL SPECIAL TESTS:  DNF test  can only hold in supone 5 sec. 12/28/22: DNF improved to 53 sec   Pt with increased sensitivity with palpation at subocciptals and bil cervical paraspinals  FUNCTIONAL TESTS:  5 times sit to stand: 14.98   TODAY'S TREATMENT:  [facility] Adult PT Treatment:                                                DATE: 01/04/23 Therapeutic Exercise: Nustep  ROW standing Green x 30 EXT Red x 30 Pec stretch in doorway x 4  Seated red band horiz  10 x 2  Seated yellow diagonals  10 x 2  Seated bilat shoulder ER green 10 x 3  MMT/ AROM Supine head lifts 5 sec x 10 Open books  x 10 each     [facility] Adult PT Treatment:                                                DATE: 12/30/22 Therapeutic Exercise: UBE 5 minutes level 1 Row GTB  x 20  Shoulder Ext red x 15 Seated UT/levator stretches  Seated chin tuck SL/ open books x 8 each  DNF endurance 53 sec Supine green band horiz abdct  GTB x 15  Supine Alt diagonals GTB x 10  Supine ER bilat GTB x15 Narrow grip pullovers GTB x 15  Pec stretch in doorway   Manual Therapy: Sub occipital release, cervical distraction, Passive UT and cervical rotation Therapeutic Activity:  Brief instruction on use of SPC to decrease antalgic pattern and trunk sway with ambulation Discussed asking MD for referral to get cane covered by insurance.    Modalities: HMP neck and back x 15 minutes    [facility] Adult PT Treatment:                                                DATE: 12/28/22 Therapeutic Exercise: UBE 5 minutes level 1  Seated UT/levator  stretches  Seated chin tuck SL/ open books x 8 each  DNF endurance 53 sec Supine green band horiz abdct  GTB Supine Alt diagonals GTB Supine ER bilat GTB Narrow grip pullovers GTB  Manual Therapy: Sub occipital release, cervical distraction, Passive UT and cervical rotation  Modalities: HMP neck and back x 15 minutes     [facility] Adult PT Treatment:  DATE: 12-23-22 FOTO 63% Therapeutic Exercise: UBE  2.5 min forward and 2.5 min backward level 1 Supine scapular stabilizers.with overhead ull narrow grip x 10 RTB VC and TC Horizontal abduction x 10 with RTB  VC and TC Diagonals x 10 on R and L RTB VC and TC Shoulder ER supine x 10 RTB VC and TC  Self Care: Posture and body mechanics  Use of two tennis balls for  for self trigger point release and return demo Explanation of need for regular movement throughout the day to build endurance for tasks.  [facility] Adult PT Treatment:                                                DATE: 12/21/22 Therapeutic Exercise: Supine chin tuck 1 x 10 holding 10 sec Supine scapular retraction 1 x 10 holding 5 seconds Upper trap / Levator scapulae stretch 1 x 30 sec on L only Upper cervical rotation Manual Therapy: Sub-occipital release- taught how to perform at home.  L upper trap/ levator scpuale stretch Tack and stretch of the sub-occipitals Self Care: Reviewed posture and effects of rolling shoulders and benefit of replacing rolling with stretching to reduce the upper trap from becoming over activated.    [facility] Adult PT Treatment:                                                DATE: 12-09-22 Therapeutic Exercise: Supine DNF -10 reps - 5-10 sec hold Seated Cervical Retraction Protraction AROM1 sets - 10 reps VC for stacking posture correctly and not sitting on tailbone.  Seated UT Stretch  3 reps - 30 hold Levator Scapulae Stretch  -3 reps - 30 hold Standing alternating wall slides using wash cloths 1  x 15 eah  Shoulder External Rotation and Scapular Retraction with RTB  3 x 10 Manual Therapy: Gentle cervical distraction Suboccipital release Scalenes Myofascial release Thoracic extension with PA grade II mobs for comfort  Trigger Point Dry-Needling performed     by Garen Lah Treatment instructions: Expect mild to moderate muscle soreness. S/S of pneumothorax if dry needled over a lung field, and to seek immediate medical attention should they occur. Patient verbalized understanding of these instructions and education.  Patient Consent Given: Yes Education handout provided: Previously provided Muscles treated:  Electrical stimulation performed: No Parameters: N/A Treatment response/outcome: twitch response noted, pt noted relief                                                                                                                           [facility] Adult PT Treatment:  DATE: 12/07/22 Therapeutic Exercise: Seated chin tuck x8 cues for reduced UT compensations Seated UT stretch 2x30sec to R Seated LS stretch 2x30sec to R  Seated double ER + scapular retraction 2x10 cues for positioning  Supine cervical retraction x10 use of pillows as external cue to reduce compensations at UT and T spine Supine cervical rotation bilaterally x8 cues for comfortable ROM seated horizontal abduction, red band x10 cues for positioning and reduced compensations at UT Verbal HEP review + education   PATIENT EDUCATION:  Education details: rationale for interventions, HEP Person educated: Patient Education method: Explanation, Demonstration, Tactile cues, Verbal cues, and Handouts Education comprehension: verbalized understanding, returned demonstration, verbal cues required, tactile cues required, and needs further education  HOME EXERCISE PROGRAM: Access Code: [email or web address] URL: [email or web address] Date: 11/25/2022 Prepared by:  Garen Lah  Exercises - Supine Deep Neck Flexor Training  - 2-3 x daily - 7 x weekly - 10 reps - 3 hold - Seated Cervical Retraction Protraction AROM  - 2-3 x daily - 7 x weekly - 1 sets - 10 reps - Seated Gentle Upper Trapezius Stretch  - 1 x daily - 7 x weekly - 1 sets - 3 reps - 30 hold - Gentle Levator Scapulae Stretch  - 1 x daily - 7 x weekly - 3 sets - 3 reps - 30 hold - Shoulder External Rotation and Scapular Retraction with Resistance  - 1-2 x daily - 7 x weekly - 3 sets - 10 reps - 3-5 sec hold Added supine scapular stabilizer exercise handout  ASSESSMENT:  CLINICAL IMPRESSION: 01/04/2023 Pt arrives to session reporting that she does not feel much pain during the day. Has a little pressure sub occipital area. Mostly just has popping in her neck. Intermittent pain in neck at night. AROM neck and shoulder significantly improved.   Will continue to reinforce HEP and complete goals in remaining visits.     Per eval - Patient is a 52 y.o. female  who was seen today for physical therapy evaluation and treatment for cervicalgia and and musculoskeletal strain after helping a friend move a month ago.  Pt complains of pain and difficulty turning her neck during day for sweeping and vacuuming and while sleeping at night.  Pt also has SLE and congenital hip dislocations/shoulder dislocations and subsequent muscle weakness.  Pt will benefit from skilled PT to address impairments that keep her from daily activities. Pt currently is not participating in any regular exercise. Will progress toward completion of goals.  OBJECTIVE IMPAIRMENTS: decreased activity tolerance, decreased ROM, decreased strength, dizziness, impaired sensation, impaired UE functional use, improper body mechanics, postural dysfunction, obesity, and pain.   ACTIVITY LIMITATIONS: carrying, lifting, sleeping, and reach over head  PARTICIPATION LIMITATIONS: meal prep, cleaning, and laundry  PERSONAL FACTORS: Lupus,  congenital hip dislocation, migraines,Pathological dislocation of shld joint bil, PTSD, Sleep apnea GERD, Asthma, Osteoporois are also affecting patient's functional outcome.   REHAB POTENTIAL: Good for cervical  Fair for shoulders and hips due to congenital issues and dislocations  CLINICAL DECISION MAKING: Evolving/moderate complexity  EVALUATION COMPLEXITY: Moderate   GOALS: Goals reviewed with patient? Yes  SHORT TERM GOALS: Target date: 11-16-22  Pt will be independent with initial HEP Baseline:  12-09-22  pt still needs guidance with HEP Goal status: ONGOING  2.  Pt pain  will reduce to at least 6/10 from 9/10 with turning neck Baseline: See AROM chart 12-09-22  Pt 7/10 and at end 3-4/10 pain Goal status: MET  3.  Demonstrate  understanding of proper sitting posture, body mechanics, work ergonomics, and be more conscious of position and posture throughout the day.  Baseline:  12/28/22: is being more conscious but has difficulty  01/04/23: still needs cues  Goal status: ONGOING   LONG TERM GOALS: Target date: 01-06-23  Pt will be independent with advanced HEP Baseline: no knowledge 12-23-22  pt given supine strengthening with sub scapular stabilizers Goal status: ONGOING  2.  Pt will be able to turn neck to 45 degrees minimally to improve environmental scanning Baseline: See AROM chart 12-23-22  /see AROM for cervical rotation Goal status: MET  3.  Pt will be able to do household chores with 50% greater ease Baseline: unable to clean tub and perform household tasks of sweeping and vacuuming 12-23-22  can clean one to two rooms  and son does tub for me 01/04/23: is limited by low back and legs, not neck  Goal status: Partially met  4.  Pt will be able to improve DNF supine test by at least 25 seconds to show increase in neck strength Baseline: unable to hold in supine 12/28/22: 53 sec Goal status: MET  5.  Pt will report 3/10 or less with turning head at night in  order to have 4 or more hours of restful sleep Baseline: wakes every 2 hours 01/04/23: no change  Goal status: ONGOING  6.  FOTO will improve from 51%   to  60%   indicating improved functional mobility.  Baseline: EVAL  51% 12-23-22 63% Goal status:MET   PLAN:  PT FREQUENCY: 1-2x/week  PT DURATION: 6 weeks  PLANNED INTERVENTIONS: Therapeutic exercises, Therapeutic activity, Neuromuscular re-education, Balance training, Gait training, Patient/Family education, Self Care, Joint mobilization, Vestibular training, Dry Needling, Electrical stimulation, Spinal mobilization, Cryotherapy, Moist heat, Taping, Manual therapy, and Re-evaluation  PLAN FOR NEXT SESSION:  assess appropriateness of TPDN and manual for symptom modification PRN. Continue to work on cervical mobility, cervical/periscapular endurance while minimizing compensations.   Jannette Spanner, PTA 01/04/23 11:39 AM Phone: [phone] Fax: [phone]

## 2023-01-06 ENCOUNTER — Encounter: Payer: Self-pay | Admitting: Physical Therapy

## 2023-01-06 ENCOUNTER — Ambulatory Visit: Payer: 59 | Admitting: Physical Therapy

## 2023-01-06 DIAGNOSIS — M542 Cervicalgia: Secondary | ICD-10-CM | POA: Diagnosis not present

## 2023-01-06 DIAGNOSIS — M25512 Pain in left shoulder: Secondary | ICD-10-CM | POA: Diagnosis not present

## 2023-01-06 DIAGNOSIS — M6281 Muscle weakness (generalized): Secondary | ICD-10-CM

## 2023-01-06 DIAGNOSIS — R293 Abnormal posture: Secondary | ICD-10-CM | POA: Diagnosis not present

## 2023-01-06 DIAGNOSIS — G8929 Other chronic pain: Secondary | ICD-10-CM | POA: Diagnosis not present

## 2023-01-06 NOTE — Therapy (Signed)
OUTPATIENT PHYSICAL THERAPY TREATMENT NOTE/DISCHARGE NOTE PHYSICAL THERAPY DISCHARGE SUMMARY  Visits from Start of Care: 9  Current functional level related to goals / functional outcomes: As indicated below   Remaining deficits: No pain remaining   Education / Equipment: HEP and given Theraband  Green and blue to progress   Patient agrees to discharge. Patient goals were  Mostly met and two partially met . Patient is being discharged due to being pleased with the current functional level.    Patient Name: Melissa Gaines MRN: [id number] DOB:Nov 20, 1970, 52 y.o., female Today's Date: 01/06/2023  END OF SESSION:  PT End of Session - 01/06/23 1059     Visit Number 9    Number of Visits 12    Date for PT Re-Evaluation 01/06/23    Authorization Type [employer] MCR    PT Start Time 1100    PT Stop Time 1145    PT Time Calculation (min) 45 min    Activity Tolerance Patient tolerated treatment well;No increased pain    Behavior During Therapy [facility] for tasks assessed/performed                  Past Medical History:  Diagnosis Date   Anemia    Anxiety    Asthma    Chronic pain    Congenital hip dislocation    GERD (gastroesophageal reflux disease)    Iron deficiency anemia 04/14/2018   Kidney mass 2017   Migraine    Osteoporosis    Pathological dislocation of shoulder joint, bilateral    congential   PTSD (post-traumatic stress disorder)    Sleep apnea    Systemic lupus erythematosus (HCC)    Urticaria    Past Surgical History:  Procedure Laterality Date   CESAREAN SECTION  2005   CESAREAN SECTION W/BTL  2010   HIP SURGERY     in childhood for congenital hip dislocation   LAPAROSCOPIC GASTRIC SLEEVE RESECTION  2014   STAPEDECTOMY  11/01/2011   L postauricular stapedectomy with CO2 laser and insertion of 6 x 4.75 mm SMart Piston   TOTAL HIP ARTHROPLASTY     05/2000 R, 11/2000 L   Patient Active Problem List   Diagnosis Date Noted   B12  deficiency 05/11/2022   Hyperlipidemia 05/07/2022   Prediabetes 05/07/2022   Environmental and seasonal allergies 08/03/2021   History of colonoscopy 07/31/2021   Asthma 07/01/2021   Gastroesophageal reflux disease 08/19/2020   Tinnitus aurium, 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   Lumbar back pain 01/25/2020   Left hip pain 06/26/2019   Muscle strain of lower extremity, left, initial encounter 06/26/2019   Healthcare maintenance 06/26/2019   Iron deficiency anemia 04/14/2018   Hematuria, gross 03/29/2018   Iron deficiency anemia due to chronic blood loss 03/29/2018   Long term current use of anticoagulant 03/29/2018   Antiphospholipid antibody syndrome (HCC) 01/25/2018   Morbid obesity (HCC) 07/26/2017   Systemic lupus erythematosus (HCC) 07/08/2017   Hx pulmonary embolism 07/08/2017   Chronic pain of right knee 05/23/2017   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/21/2016   Vitamin D deficiency 12/16/2016   Anemia    Migraine     PCP: Levin Erp, MD   REFERRING PROVIDER: Drema Dallas, DO   REFERRING DIAG: M54.2 (ICD-10-CM) - Cervicalgia   THERAPY DIAG:  Cervicalgia  Muscle weakness (generalized)  Abnormal posture  Chronic left shoulder pain  Rationale for Evaluation and Treatment: Rehabilitation  ONSET DATE: August 2024   SUBJECTIVE:  Per eval - I was helping a friend out moving and later I noticed when I woke up the next day, I had neck pain. I thought I might have slept the wrong way. Stated on my left side of my neck and now it is down the middle. When I turn to the R and the L , I hear my neck clicking. When I bend my neck all the way down , I feel dizzy.  I don't drive because of disability with dislocated hips since childbirth.  My shoulders give me problems as well.   I also have headaches daily  but mild.  Bright lights are hard for me, I have tingling down both arms and both legs910   Hand dominance: Right  SUBJECTIVE STATEMENT: 01/06/2023     PERTINENT HISTORY:  Lupus, congenital hip dislocation, migraines,Pathological dislocation of shld joint bil, PTSD, Sleep apnea GERD, Asthma, Osteoporois  PAIN:  Are you having pain? Yes: NPRS scale: 0/10 Pain location: BIL neck pain L > R and down the middle Pain description: sharp pain,,pounding Aggravating factors: when I have to turn my head, sweeping and mopping, washing the tub Relieving factors: laying down  Sleeping is hard because I have pain turning my neck PRECAUTIONS: None  RED FLAGS: None     WEIGHT BEARING RESTRICTIONS: No  FALLS:  Has patient fallen in last 6 months? No  LIVING ENVIRONMENT: Lives with: lives with their family Lives in: House/apartment Stairs: Yes: External: 1 steps; none Has following equipment at home: None  OCCUPATION: On disability  PLOF: Independent  PATIENT GOALS: My neck to feel normal and be able to turn  NEXT MD VISIT: in September late  OBJECTIVE: (objective measures completed at initial evaluation unless otherwise dated)   DIAGNOSTIC FINDINGS:  No recent  PATIENT SURVEYS:  FOTO 51 % predicted 60% 12-23-22 63% MET  COGNITION: Overall cognitive status: Within functional limits for tasks assessed  SENSATION: Pt reports bil tingling in bil UE and LE  POSTURE: rounded shoulders, forward head, and flexed trunk  obesity  PALPATION: TTP over bil cervical paraspinals and UT  Left > Right   CERVICAL ROM:   Active ROM A/PROM (deg) eval AROM 12-23-22 AROM 01/04/23 AROM 10/31-24  Flexion 30     Extension 38     Right lateral flexion 22  45 45  Left lateral flexion 19  48 48  Right rotation 35 59 65 65  Left rotation 40 58 65 65   (Blank rows = not tested)  UPPER EXTREMITY ROM:  Active ROM Right eval Left eval RT/LT 01/04/23  Shoulder flexion 105 130  140/142  Shoulder extension     Shoulder abduction 105 110 150/150  Shoulder adduction     Shoulder extension     Shoulder internal rotation     Shoulder external rotation     Elbow flexion     Elbow extension     Wrist flexion     Wrist extension     Wrist ulnar deviation     Wrist radial deviation     Wrist pronation     Wrist supination      (Blank rows = not tested)  UPPER EXTREMITY MMT:  Pt with global tightness.  MMT Right eval Left eval Right 01/04/23  Shoulder flexion 3+ 4 4  Shoulder extension     Shoulder abduction 3+ 4 4  Shoulder adduction     Shoulder extension     Shoulder internal rotation  Shoulder external rotation     Middle trapezius 4- 4-   Lower trapezius 3+ 3+   Elbow flexion     Elbow extension     Wrist flexion     Wrist extension     Wrist ulnar deviation     Wrist radial deviation     Wrist pronation     Wrist supination     Grip strength 30 lb 32 lb    (Blank rows = not tested)  CERVICAL SPECIAL TESTS:  DNF test  can only hold in supone 5 sec. 12/28/22: DNF improved to 53 sec   Pt with increased sensitivity with palpation at subocciptals and bil cervical paraspinals  FUNCTIONAL TESTS:  5 times sit to stand: 14.98 % x STS 9.68 sec   TODAY'S TREATMENT:  ERO ? [facility] Adult PT Treatment:                                                DATE: 01-06-23 Therapeutic Exercise: Reviewed HEP for home use with supine scapular stablizers Cervical retraction ROW standing Green x 30 EXT Green x 30 Pec stretch in doorway x 4  UT stretch LS stretch  Supine head lifts 5 sec x 10 Star pattern with GTB 3 x 10  Discussed some alternate sleep positions for comfort at night and utilizing exercise for pain at night   [facility] Adult PT Treatment:                                                DATE: 01/04/23 Therapeutic Exercise:  ROW standing Green x 30 EXT Red x 30 Pec stretch in doorway x 4  Seated red band horiz  10 x 2  Seated yellow  diagonals  10 x 2  Seated bilat shoulder ER green 10 x 3  MMT/ AROM Supine head lifts 5 sec x 10 Open books  x 10 each     [facility] Adult PT Treatment:                                                DATE: 12/30/22 Therapeutic Exercise: UBE 5 minutes level 1 Row GTB  x 20  Shoulder Ext red x 15 Seated UT/levator stretches  Seated chin tuck SL/ open books x 8 each  DNF endurance 53 sec Supine green band horiz abdct  GTB x 15  Supine Alt diagonals GTB x 10  Supine ER bilat GTB x15 Narrow grip pullovers GTB x 15  Pec stretch in doorway   Manual Therapy: Sub occipital release, cervical distraction, Passive UT and cervical rotation Therapeutic Activity:  Brief instruction on use of SPC to decrease antalgic pattern and trunk sway with ambulation Discussed asking MD for referral to get cane covered by insurance.    Modalities: HMP neck and back x 15 minutes    [facility] Adult PT Treatment:  DATE: 12/28/22 Therapeutic Exercise: UBE 5 minutes level 1  Seated UT/levator stretches  Seated chin tuck SL/ open books x 8 each  DNF endurance 53 sec Supine green band horiz abdct  GTB Supine Alt diagonals GTB Supine ER bilat GTB Narrow grip pullovers GTB  Manual Therapy: Sub occipital release, cervical distraction, Passive UT and cervical rotation  Modalities: HMP neck and back x 15 minutes     [facility] Adult PT Treatment:                                                DATE: 12-23-22 FOTO 63% Therapeutic Exercise: UBE  2.5 min forward and 2.5 min backward level 1 Supine scapular stabilizers.with overhead ull narrow grip x 10 RTB VC and TC Horizontal abduction x 10 with RTB  VC and TC Diagonals x 10 on R and L RTB VC and TC Shoulder ER supine x 10 RTB VC and TC  Self Care: Posture and body mechanics  Use of two tennis balls for  for self trigger point release and return demo Explanation of need for regular movement throughout the day to  build endurance for tasks.  [facility] Adult PT Treatment:                                                DATE: 12/21/22 Therapeutic Exercise: Supine chin tuck 1 x 10 holding 10 sec Supine scapular retraction 1 x 10 holding 5 seconds Upper trap / Levator scapulae stretch 1 x 30 sec on L only Upper cervical rotation Manual Therapy: Sub-occipital release- taught how to perform at home.  L upper trap/ levator scpuale stretch Tack and stretch of the sub-occipitals Self Care: Reviewed posture and effects of rolling shoulders and benefit of replacing rolling with stretching to reduce the upper trap from becoming over activated.    [facility] Adult PT Treatment:                                                DATE: 12-09-22 Therapeutic Exercise: Supine DNF -10 reps - 5-10 sec hold Seated Cervical Retraction Protraction AROM1 sets - 10 reps VC for stacking posture correctly and not sitting on tailbone.  Seated UT Stretch  3 reps - 30 hold Levator Scapulae Stretch  -3 reps - 30 hold Standing alternating wall slides using wash cloths 1 x 15 eah  Shoulder External Rotation and Scapular Retraction with RTB  3 x 10 Manual Therapy: Gentle cervical distraction Suboccipital release Scalenes Myofascial release Thoracic extension with PA grade II mobs for comfort  Trigger Point Dry-Needling performed     by Garen Lah Treatment instructions: Expect mild to moderate muscle soreness. S/S of pneumothorax if dry needled over a lung field, and to seek immediate medical attention should they occur. Patient verbalized understanding of these instructions and education.  Patient Consent Given: Yes Education handout provided: Previously provided Muscles treated:  Electrical stimulation performed: No Parameters: N/A Treatment response/outcome: twitch response noted, pt noted relief  [facility] Adult  PT Treatment:                                                DATE: 12/07/22 Therapeutic Exercise: Seated chin tuck x8 cues for reduced UT compensations Seated UT stretch 2x30sec to R Seated LS stretch 2x30sec to R  Seated double ER + scapular retraction 2x10 cues for positioning  Supine cervical retraction x10 use of pillows as external cue to reduce compensations at UT and T spine Supine cervical rotation bilaterally x8 cues for comfortable ROM seated horizontal abduction, red band x10 cues for positioning and reduced compensations at UT Verbal HEP review + education   PATIENT EDUCATION:  Education details: rationale for interventions, HEP Person educated: Patient Education method: Explanation, Demonstration, Tactile cues, Verbal cues, and Handouts Education comprehension: verbalized understanding, returned demonstration, verbal cues required, tactile cues required, and needs further education  HOME EXERCISE PROGRAM: Access Code: [email or web address] URL: [email or web address] Date: 11/25/2022 Prepared by: Garen Lah  Exercises - Supine Deep Neck Flexor Training  - 2-3 x daily - 7 x weekly - 10 reps - 3 hold - Seated Cervical Retraction Protraction AROM  - 2-3 x daily - 7 x weekly - 1 sets - 10 reps - Seated Gentle Upper Trapezius Stretch  - 1 x daily - 7 x weekly - 1 sets - 3 reps - 30 hold - Gentle Levator Scapulae Stretch  - 1 x daily - 7 x weekly - 3 sets - 3 reps - 30 hold - Shoulder External Rotation and Scapular Retraction with Resistance  - 1-2 x daily - 7 x weekly - 3 sets - 10 reps - 3-5 sec hold Added supine scapular stabilizer exercise handout Added 01-06-23 - Scapular Retraction with Resistance  - 2 x daily - 7 x weekly - 3 sets - 10 reps - Shoulder Extension with Resistance - Palms Forward  - 1 x daily - 7 x weekly - 3 sets - 10 reps - Doorway Pec Stretch at 90 Degrees Abduction  - 1 x daily - 7 x weekly - 3 sets - 10 reps  ASSESSMENT:  CLINICAL  IMPRESSION: 01/06/2023 Pt arrives to session reporting that she does not feel any pain today or during the day. She is a little uncomfortable at night sleeping on stomach and PT went over some alternate positions for comfort at night and to utilize exercises for increased comfort.  Pt able to achieve all LTG and a couple with MMT/ sleep partially met.  Pt is pleased with current level of function and is Independent with HEP for home use. Mostly just has popping in her neck. Intermittent pain in neck at night. AROM neck and shoulder significantly improved. Ends with 635 FOTO improvement  Will Dc to HEP    Per eval - Patient is a 52 y.o. female  who was seen today for physical therapy evaluation and treatment for cervicalgia and and musculoskeletal strain after helping a friend move a month ago.  Pt complains of pain and difficulty turning her neck during day for sweeping and vacuuming and while sleeping at night.  Pt also has SLE and congenital hip dislocations/shoulder dislocations and subsequent muscle weakness.  Pt will benefit from skilled PT to address impairments that keep her from daily activities. Pt currently is not participating in any regular exercise. Will progress toward completion of goals.  OBJECTIVE IMPAIRMENTS: decreased activity tolerance, decreased ROM, decreased strength, dizziness, impaired sensation, impaired UE functional use, improper body mechanics, postural dysfunction, obesity, and pain.   ACTIVITY LIMITATIONS: carrying, lifting, sleeping, and reach over head  PARTICIPATION LIMITATIONS: meal prep, cleaning, and laundry  PERSONAL FACTORS: Lupus, congenital hip dislocation, migraines,Pathological dislocation of shld joint bil, PTSD, Sleep apnea GERD, Asthma, Osteoporois are also affecting patient's functional outcome.   REHAB POTENTIAL: Good for cervical  Fair for shoulders and hips due to congenital issues and dislocations  CLINICAL DECISION MAKING: Evolving/moderate  complexity  EVALUATION COMPLEXITY: Moderate   GOALS: Goals reviewed with patient? Yes  SHORT TERM GOALS: Target date: 11-16-22  Pt will be independent with initial HEP Baseline:  12-09-22  pt still needs guidance with HEP Goal status: ONGOING  2.  Pt pain  will reduce to at least 6/10 from 9/10 with turning neck Baseline: See AROM chart 12-09-22  Pt 7/10 and at end 3-4/10 pain Goal status: MET  3.  Demonstrate understanding of proper sitting posture, body mechanics, work ergonomics, and be more conscious of position and posture throughout the day.  Baseline:  12/28/22: is being more conscious but has difficulty  01/04/23: still needs cues  Goal status: ONGOING   LONG TERM GOALS: Target date: 01-06-23  Pt will be independent with advanced HEP Baseline: no knowledge 12-23-22  pt given supine strengthening with sub scapular stabilizers Goal status: MET  2.  Pt will be able to turn neck to 45 degrees minimally to improve environmental scanning Baseline: See AROM chart 12-23-22  /see AROM for cervical rotation Goal status: MET  3.  Pt will be able to do household chores with 50% greater ease Baseline: unable to clean tub and perform household tasks of sweeping and vacuuming 12-23-22  can clean one to two rooms  and son does tub for me 01/04/23: is limited by low back and legs, not neck  Goal status: Partially met  4.  Pt will be able to improve DNF supine test by at least 25 seconds to show increase in neck strength Baseline: unable to hold in supine 12/28/22: 53 sec Goal status: MET  5.  Pt will report 3/10 or less with turning head at night in order to have 4 or more hours of restful sleep Baseline: wakes every 2 hours 01/04/23: no change  01-06-23  No pain during the day, uses small pillow or towel roll to sleep Goal status: Partially met because she adjusts pillow for comfort  6.  FOTO will improve from 51%   to  60%   indicating improved functional mobility.   Baseline: EVAL  51% 12-23-22 63% Goal status:MET   PLAN:  PT FREQUENCY: 1-2x/week  PT DURATION: 6 weeks  PLANNED INTERVENTIONS: Therapeutic exercises, Therapeutic activity, Neuromuscular re-education, Balance training, Gait training, Patient/Family education, Self Care, Joint mobilization, Vestibular training, Dry Needling, Electrical stimulation, Spinal mobilization, Cryotherapy, Moist heat, Taping, Manual therapy, and Re-evaluation  PLAN FOR NEXT SESSION:  assess appropriateness of TPDN and manual for symptom modification PRN. Continue to work on cervical mobility, cervical/periscapular endurance while minimizing compensations.   Garen Lah, PT, ATRIC Certified Exercise Expert for the Aging Adult  01/06/23 11:43 AM Phone: [phone] Fax: [phone]

## 2023-01-07 ENCOUNTER — Ambulatory Visit (INDEPENDENT_AMBULATORY_CARE_PROVIDER_SITE_OTHER): Payer: 59

## 2023-01-07 DIAGNOSIS — J309 Allergic rhinitis, unspecified: Secondary | ICD-10-CM

## 2023-01-10 ENCOUNTER — Other Ambulatory Visit: Payer: Self-pay | Admitting: Student

## 2023-01-10 DIAGNOSIS — D6861 Antiphospholipid syndrome: Secondary | ICD-10-CM

## 2023-01-19 ENCOUNTER — Inpatient Hospital Stay: Payer: 59 | Attending: Nurse Practitioner

## 2023-01-19 VITALS — BP 133/76 | HR 70 | Temp 97.7°F | Resp 18

## 2023-01-19 DIAGNOSIS — E538 Deficiency of other specified B group vitamins: Secondary | ICD-10-CM | POA: Insufficient documentation

## 2023-01-19 DIAGNOSIS — D508 Other iron deficiency anemias: Secondary | ICD-10-CM

## 2023-01-19 MED ORDER — CYANOCOBALAMIN 1000 MCG/ML IJ SOLN
1000.0000 ug | INTRAMUSCULAR | Status: DC
  Administered 2023-01-19: 1000 ug via INTRAMUSCULAR
  Filled 2023-01-19: qty 1

## 2023-01-31 ENCOUNTER — Ambulatory Visit (INDEPENDENT_AMBULATORY_CARE_PROVIDER_SITE_OTHER): Payer: 59

## 2023-01-31 DIAGNOSIS — J309 Allergic rhinitis, unspecified: Secondary | ICD-10-CM

## 2023-02-05 ENCOUNTER — Other Ambulatory Visit: Payer: Self-pay | Admitting: Student

## 2023-02-05 DIAGNOSIS — D6861 Antiphospholipid syndrome: Secondary | ICD-10-CM

## 2023-02-14 ENCOUNTER — Other Ambulatory Visit (HOSPITAL_COMMUNITY): Payer: Self-pay | Admitting: Psychiatry

## 2023-02-14 ENCOUNTER — Other Ambulatory Visit: Payer: Self-pay | Admitting: Family Medicine

## 2023-02-14 DIAGNOSIS — F331 Major depressive disorder, recurrent, moderate: Secondary | ICD-10-CM

## 2023-02-14 DIAGNOSIS — Z0000 Encounter for general adult medical examination without abnormal findings: Secondary | ICD-10-CM

## 2023-02-14 DIAGNOSIS — M329 Systemic lupus erythematosus, unspecified: Secondary | ICD-10-CM | POA: Diagnosis not present

## 2023-02-14 DIAGNOSIS — Z79899 Other long term (current) drug therapy: Secondary | ICD-10-CM | POA: Diagnosis not present

## 2023-02-14 DIAGNOSIS — F4310 Post-traumatic stress disorder, unspecified: Secondary | ICD-10-CM

## 2023-02-14 DIAGNOSIS — M2550 Pain in unspecified joint: Secondary | ICD-10-CM | POA: Diagnosis not present

## 2023-02-16 ENCOUNTER — Inpatient Hospital Stay: Payer: 59

## 2023-02-16 ENCOUNTER — Inpatient Hospital Stay: Payer: 59 | Attending: Nurse Practitioner

## 2023-02-16 ENCOUNTER — Other Ambulatory Visit: Payer: Self-pay

## 2023-02-16 DIAGNOSIS — Z9884 Bariatric surgery status: Secondary | ICD-10-CM | POA: Insufficient documentation

## 2023-02-16 DIAGNOSIS — E538 Deficiency of other specified B group vitamins: Secondary | ICD-10-CM | POA: Insufficient documentation

## 2023-02-16 DIAGNOSIS — D508 Other iron deficiency anemias: Secondary | ICD-10-CM | POA: Diagnosis not present

## 2023-02-16 DIAGNOSIS — N920 Excessive and frequent menstruation with regular cycle: Secondary | ICD-10-CM | POA: Diagnosis not present

## 2023-02-16 DIAGNOSIS — D500 Iron deficiency anemia secondary to blood loss (chronic): Secondary | ICD-10-CM

## 2023-02-16 DIAGNOSIS — K909 Intestinal malabsorption, unspecified: Secondary | ICD-10-CM | POA: Diagnosis not present

## 2023-02-16 LAB — VITAMIN B12: Vitamin B-12: 512 pg/mL (ref 180–914)

## 2023-02-16 LAB — IRON AND IRON BINDING CAPACITY (CC-WL,HP ONLY)
Iron: 55 ug/dL (ref 28–170)
Saturation Ratios: 13 % (ref 10.4–31.8)
TIBC: 433 ug/dL (ref 250–450)
UIBC: 378 ug/dL (ref 148–442)

## 2023-02-16 LAB — CMP (CANCER CENTER ONLY)
ALT: 14 U/L (ref 0–44)
AST: 12 U/L — ABNORMAL LOW (ref 15–41)
Albumin: 4.2 g/dL (ref 3.5–5.0)
Alkaline Phosphatase: 70 U/L (ref 38–126)
Anion gap: 10 (ref 5–15)
BUN: 7 mg/dL (ref 6–20)
CO2: 23 mmol/L (ref 22–32)
Calcium: 9.4 mg/dL (ref 8.9–10.3)
Chloride: 106 mmol/L (ref 98–111)
Creatinine: 0.5 mg/dL (ref 0.44–1.00)
GFR, Estimated: >60 mL/min (ref >60)
Glucose, Bld: 99 mg/dL (ref 70–99)
Potassium: 3.9 mmol/L (ref 3.5–5.1)
Sodium: 139 mmol/L (ref 135–145)
Total Bilirubin: 0.4 mg/dL (ref <1.2)
Total Protein: 7.9 g/dL (ref 6.5–8.1)

## 2023-02-16 LAB — CBC WITH DIFFERENTIAL (CANCER CENTER ONLY)
Abs Immature Granulocytes: 0.04 10*3/uL (ref 0.00–0.07)
Basophils Absolute: 0.1 10*3/uL (ref 0.0–0.1)
Basophils Relative: 1 %
Eosinophils Absolute: 0.3 10*3/uL (ref 0.0–0.5)
Eosinophils Relative: 3 %
HCT: 44.3 % (ref 36.0–46.0)
Hemoglobin: 14.3 g/dL (ref 12.0–15.0)
Immature Granulocytes: 1 %
Lymphocytes Relative: 35 %
Lymphs Abs: 2.6 10*3/uL (ref 0.7–4.0)
MCH: 28.5 pg (ref 26.0–34.0)
MCHC: 32.3 g/dL (ref 30.0–36.0)
MCV: 88.2 fL (ref 80.0–100.0)
Monocytes Absolute: 0.5 10*3/uL (ref 0.1–1.0)
Monocytes Relative: 6 %
Neutro Abs: 4 10*3/uL (ref 1.7–7.7)
Neutrophils Relative %: 54 %
Platelet Count: 284 10*3/uL (ref 150–400)
RBC: 5.02 MIL/uL (ref 3.87–5.11)
RDW: 14.6 % (ref 11.5–15.5)
WBC Count: 7.3 10*3/uL (ref 4.0–10.5)
nRBC: 0 % (ref 0.0–0.2)

## 2023-02-16 LAB — FERRITIN: Ferritin: 51 ng/mL (ref 11–307)

## 2023-02-16 MED ORDER — CYANOCOBALAMIN 1000 MCG/ML IJ SOLN
1000.0000 ug | INTRAMUSCULAR | Status: DC
  Administered 2023-02-16: 1000 ug via INTRAMUSCULAR
  Filled 2023-02-16: qty 1

## 2023-02-16 NOTE — Patient Instructions (Signed)
Vitamin B12 Deficiency Vitamin B12 deficiency occurs when the body does not have enough of this important vitamin. The body needs this vitamin: To make red blood cells. To make DNA. This is the genetic material inside cells. To help the nerves work properly so they can carry messages from the brain to the body. Vitamin B12 deficiency can cause health problems, such as not having enough red blood cells in the blood (anemia). This can lead to nerve damage if untreated. What are the causes? This condition may be caused by: Not eating enough foods that contain vitamin B12. Not having enough stomach acid and digestive fluids to properly absorb vitamin B12 from the food that you eat. Having certain diseases that make it hard to absorb vitamin B12. These diseases include Crohn's disease, chronic pancreatitis, and cystic fibrosis. An autoimmune disorder in which the body does not make enough of a protein (intrinsic factor) within the stomach, resulting in not enough absorption of vitamin B12. Having a surgery in which part of the stomach or small intestine is removed. Taking certain medicines that make it hard for the body to absorb vitamin B12. These include: Heartburn medicines, such as antacids and proton pump inhibitors. Some medicines that are used to treat diabetes. What increases the risk? The following factors may make you more likely to develop a vitamin B12 deficiency: Being an older adult. Eating a vegetarian or vegan diet that does not include any foods that come from animals. Eating a poor diet while you are pregnant. Taking certain medicines. Having alcoholism. What are the signs or symptoms? In some cases, there are no symptoms of this condition. If the condition leads to anemia or nerve damage, various symptoms may occur, such as: Weakness. Tiredness (fatigue). Loss of appetite. Numbness or tingling in your hands and feet. Redness and burning of the tongue. Depression,  confusion, or memory problems. Trouble walking. If anemia is severe, symptoms can include: Shortness of breath. Dizziness. Rapid heart rate. How is this diagnosed? This condition may be diagnosed with a blood test to measure the level of vitamin B12 in your blood. You may also have other tests, including: A group of tests that measure certain characteristics of blood cells (complete blood count, CBC). A blood test to measure intrinsic factor. A procedure where a thin tube with a camera on the end is used to look into your stomach or intestines (endoscopy). Other tests may be needed to discover the cause of the deficiency. How is this treated? Treatment for this condition depends on the cause. This condition may be treated by: Changing your eating and drinking habits, such as: Eating more foods that contain vitamin B12. Drinking less alcohol or no alcohol. Getting vitamin B12 injections. Taking vitamin B12 supplements by mouth (orally). Your health care provider will tell you which dose is best for you. Follow these instructions at home: Eating and drinking  Include foods in your diet that come from animals and contain a lot of vitamin B12. These include: Meats and poultry. This includes beef, pork, chicken, turkey, and organ meats, such as liver. Seafood. This includes clams, rainbow trout, salmon, tuna, and haddock. Eggs. Dairy foods such as milk, yogurt, and cheese. Eat foods that have vitamin B12 added to them (are fortified), such as ready-to-eat breakfast cereals. Check the label on the package to see if a food is fortified. The items listed above may not be a complete list of foods and beverages you can eat and drink. Contact a dietitian for   more information. Alcohol use Do not drink alcohol if: Your health care provider tells you not to drink. You are pregnant, may be pregnant, or are planning to become pregnant. If you drink alcohol: Limit how much you have to: 0-1 drink a  day for women. 0-2 drinks a day for men. Know how much alcohol is in your drink. In the [place] one drink equals one 12 oz bottle of beer (355 mL), one 5 oz glass of wine (148 mL), or one 1 oz glass of hard liquor (44 mL). General instructions Get vitamin B12 injections if told to by your health care provider. Take supplements only as told by your health care provider. Follow the directions carefully. Keep all follow-up visits. This is important. Contact a health care provider if: Your symptoms come back. Your symptoms get worse or do not improve with treatment. Get help right away: You develop shortness of breath. You have a rapid heart rate. You have chest pain. You become dizzy or you faint. These symptoms may be an emergency. Get help right away. Call 911. Do not wait to see if the symptoms will go away. Do not drive yourself to the hospital. Summary Vitamin B12 deficiency occurs when the body does not have enough of this important vitamin. Common causes include not eating enough foods that contain vitamin B12, not being able to absorb vitamin B12 from the food that you eat, having a surgery in which part of the stomach or small intestine is removed, or taking certain medicines. Eat foods that have vitamin B12 in them. Treatment may include making a change in the way you eat and drink, getting vitamin B12 injections, or taking vitamin B12 supplements. This information is not intended to replace advice given to you by your health care provider. Make sure you discuss any questions you have with your health care provider. Document Revised: 10/17/2020 Document Reviewed: 10/17/2020 Elsevier Patient Education  2024 [employer]  

## 2023-02-23 NOTE — Progress Notes (Signed)
SUBJECTIVE:   Chief compliant/HPI: annual examination  Melissa Gaines is a 52 y.o. who presents today for an annual exam.   Presents for a routine physical and to request an emotional support letter for her service dog. She expresses a strong emotional attachment to the dog, describing it as a source of safety and therapeutic comfort. She fears she may have to give the dog away without the necessary documentation. She is followed by psychiatry and takes prozac and trazadone.  The patient reports ongoing neck pain, which has been present for approximately two months. The pain is described as tender and is exacerbated by movement. No weakness in hands. Sometimes some shooting pain into hands.  History tabs reviewed and updated PMH migraines, asthma, GERD, antiphospholipid syndrome,  hx of PE, SLE, anemia.    OBJECTIVE:   BP 124/80   Pulse 71   Ht 4\' 10"  (1.473 m)   Wt 214 lb (97.1 kg)   LMP  (LMP Unknown)   SpO2 97%   BMI 44.73 kg/m   General: Well appearing, NAD, awake, alert, responsive to questions Head: Normocephalic atraumatic, mild tenderness over neck and shoulders, no acute tenderness over spinal processes CV: Regular rate and rhythm no murmurs rubs or gallops Respiratory: Clear to ausculation bilaterally, no wheezes rales or crackles, chest rises symmetrically,  no increased work of breathing Abdomen: Soft, non-tender, non-distended, normoactive bowel sounds  Extremities: Moves upper and lower extremities freely, no edema in LE, no calf tenderness; 5/5 grip strength b/l Neuro: No focal deficits Skin: No rashes or lesions visualized  ASSESSMENT/PLAN:   Assessment & Plan Elevated LDL cholesterol level Hx of elevated LDL and was started on atorvastatin earlier this year -Recheck LDL Neck pain Possibly a component spasm and.  Does have some radiculopathy symptoms however no weakness in upper extremities. - Voltaren gel  - return if worsening,  consider neck xray - consider physical therapy if not improving.    Annual Examination  See AVS for age appropriate recommendations  PHQ score     02/25/2023    1:15 PM 07/19/2022    6:11 PM 05/07/2022   10:02 AM 07/31/2021    1:30 PM 07/01/2021    1:47 PM  Depression screen PHQ 2/9  Decreased Interest 1 0 1 1 1   Down, Depressed, Hopeless 1 0 1 1 1   PHQ - 2 Score 2 0 2 2 2   Altered sleeping 1  1 1 1   Tired, decreased energy 1  1 1 1   Change in appetite 0  1 1 2   Feeling bad or failure about yourself  0  0 1 1  Trouble concentrating 0  1 1 0  Moving slowly or fidgety/restless 0  0 1 0  Suicidal thoughts 0  0 0 1  PHQ-9 Score 4  6 8 8   Difficult doing work/chores Somewhat difficult       reviewed and discussed.  BP reviewed and at goal .   Considered the following items based upon USPSTF recommendations: Diabetes screening:  discussed, previous wnl Screening for elevated cholesterol: ordered HIV testing: ordered Hepatitis C: ordered Hepatitis B: discussed Syphilis if at high risk: ordered GC/CT not at high risk and not ordered. Osteoporosis screening considered based upon risk of fracture from [facility] calculator. Major osteoporotic fracture risk is 5%. DEXA not ordered.  Reviewed risk factors for latent tuberculosis and not discussed   Cervical cancer screening: prior Pap reviewed, repeat due in 2028 Breast cancer screening: discussed  and ordered mammogram based upon personal history   Colorectal cancer screening: Patient had colonoscopy in 04/2021 which was stopped early due to asthmatic reaction to anesthesia. Needs Repeat in 2024. Lung cancer screening: discussed, never smoker Vaccinations - COVID.   Follow up in 1  year or sooner if indicated.    Levin Erp, MD [facility]

## 2023-02-25 ENCOUNTER — Ambulatory Visit (INDEPENDENT_AMBULATORY_CARE_PROVIDER_SITE_OTHER): Payer: 59 | Admitting: Student

## 2023-02-25 ENCOUNTER — Encounter: Payer: Self-pay | Admitting: Student

## 2023-02-25 VITALS — BP 124/80 | HR 71 | Ht <= 58 in | Wt 214.0 lb

## 2023-02-25 DIAGNOSIS — M542 Cervicalgia: Secondary | ICD-10-CM

## 2023-02-25 DIAGNOSIS — Z1211 Encounter for screening for malignant neoplasm of colon: Secondary | ICD-10-CM | POA: Diagnosis not present

## 2023-02-25 DIAGNOSIS — Z13228 Encounter for screening for other metabolic disorders: Secondary | ICD-10-CM | POA: Diagnosis not present

## 2023-02-25 DIAGNOSIS — Z23 Encounter for immunization: Secondary | ICD-10-CM | POA: Diagnosis not present

## 2023-02-25 DIAGNOSIS — Z1231 Encounter for screening mammogram for malignant neoplasm of breast: Secondary | ICD-10-CM

## 2023-02-25 DIAGNOSIS — E7800 Pure hypercholesterolemia, unspecified: Secondary | ICD-10-CM

## 2023-02-25 MED ORDER — DICLOFENAC SODIUM 1 % EX GEL: 4.0000 g | Freq: Four times a day (QID) | CUTANEOUS | 0 refills | Status: AC

## 2023-02-25 NOTE — Patient Instructions (Addendum)
It was great to see you! Thank you for allowing me to participate in your care!   Our plans for today:  -  -03/17/2023 10:40 AM Arfeen, Phillips Grout, MD [facility] [place]   - We will get some labs today -Will prescribe a topic gel for your neck, if worsening please return  Take care and seek immediate care sooner if you develop any concerns.  Levin Erp, MD

## 2023-02-26 LAB — LDL CHOLESTEROL, DIRECT: LDL Direct: 120 mg/dL — ABNORMAL HIGH (ref 0–99)

## 2023-02-26 LAB — HCV INTERPRETATION

## 2023-02-26 LAB — RPR: RPR Ser Ql: NONREACTIVE

## 2023-02-26 LAB — HCV AB W REFLEX TO QUANT PCR: HCV Ab: NONREACTIVE

## 2023-02-26 LAB — HIV ANTIBODY (ROUTINE TESTING W REFLEX): HIV Screen 4th Generation wRfx: NONREACTIVE

## 2023-02-28 ENCOUNTER — Ambulatory Visit (INDEPENDENT_AMBULATORY_CARE_PROVIDER_SITE_OTHER): Payer: 59

## 2023-02-28 DIAGNOSIS — J309 Allergic rhinitis, unspecified: Secondary | ICD-10-CM | POA: Diagnosis not present

## 2023-03-03 ENCOUNTER — Telehealth: Payer: Self-pay | Admitting: Student

## 2023-03-03 DIAGNOSIS — E785 Hyperlipidemia, unspecified: Secondary | ICD-10-CM

## 2023-03-03 MED ORDER — ATORVASTATIN CALCIUM 80 MG PO TABS: 80.0000 mg | ORAL_TABLET | Freq: Every day | ORAL | 3 refills | Status: DC

## 2023-03-03 NOTE — Telephone Encounter (Signed)
Called patient to discuss recent lipid panel showing LDL 120.  Goal LDL is less than 100 considering diagnosis of antiphospholipid syndrome.  Patient is agreeable to increasing atorvastatin to 80 mg daily.  Discussed possible side effect of myalgias and plan to recheck lipid panel in the next 6 months or so.

## 2023-03-04 ENCOUNTER — Other Ambulatory Visit (HOSPITAL_COMMUNITY): Payer: Self-pay | Admitting: Psychiatry

## 2023-03-04 DIAGNOSIS — F331 Major depressive disorder, recurrent, moderate: Secondary | ICD-10-CM

## 2023-03-04 DIAGNOSIS — F4310 Post-traumatic stress disorder, unspecified: Secondary | ICD-10-CM

## 2023-03-07 ENCOUNTER — Telehealth (HOSPITAL_COMMUNITY): Payer: Self-pay

## 2023-03-07 NOTE — Telephone Encounter (Addendum)
This is a patient of Dr. Lolly Mustache - Patient is calling to request a letter for her animals. Patient would like emotional support letters for her dog and her cat. She is requesting the letter by Jan.1. Please review and advise, thank you

## 2023-03-10 ENCOUNTER — Encounter (HOSPITAL_COMMUNITY): Payer: Self-pay | Admitting: Internal Medicine

## 2023-03-10 ENCOUNTER — Telehealth: Payer: Self-pay

## 2023-03-10 DIAGNOSIS — J302 Other seasonal allergic rhinitis: Secondary | ICD-10-CM | POA: Diagnosis not present

## 2023-03-10 NOTE — Telephone Encounter (Signed)
 Melissa Gaines with [facility] [employer] LVM on nurse line requesting a call back.   She reports needing verification that PCP provided an emotional support animal letter on 12/20.  I called [facility] and LVM on their secure line verifying we did provide letter on 12/20.

## 2023-03-10 NOTE — Progress Notes (Signed)
 VIAL EXP 03-09-24

## 2023-03-11 ENCOUNTER — Ambulatory Visit: Payer: 59

## 2023-03-12 ENCOUNTER — Other Ambulatory Visit: Payer: Self-pay | Admitting: Student

## 2023-03-12 DIAGNOSIS — D6861 Antiphospholipid syndrome: Secondary | ICD-10-CM

## 2023-03-17 ENCOUNTER — Ambulatory Visit (HOSPITAL_BASED_OUTPATIENT_CLINIC_OR_DEPARTMENT_OTHER): Payer: 59 | Admitting: Psychiatry

## 2023-03-17 ENCOUNTER — Encounter (HOSPITAL_COMMUNITY): Payer: Self-pay | Admitting: Psychiatry

## 2023-03-17 ENCOUNTER — Other Ambulatory Visit: Payer: Self-pay

## 2023-03-17 DIAGNOSIS — F4310 Post-traumatic stress disorder, unspecified: Secondary | ICD-10-CM

## 2023-03-17 DIAGNOSIS — F331 Major depressive disorder, recurrent, moderate: Secondary | ICD-10-CM | POA: Diagnosis not present

## 2023-03-17 MED ORDER — GABAPENTIN 300 MG PO CAPS: 300.0000 mg | ORAL_CAPSULE | Freq: Every day | ORAL | 0 refills | Status: DC

## 2023-03-17 MED ORDER — TRAZODONE HCL 100 MG PO TABS: 100.0000 mg | ORAL_TABLET | Freq: Every day | ORAL | 0 refills | Status: DC

## 2023-03-17 MED ORDER — FLUOXETINE HCL 20 MG PO CAPS: 20.0000 mg | ORAL_CAPSULE | Freq: Every day | ORAL | 0 refills | Status: DC
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
## 2023-03-18 ENCOUNTER — Encounter (HOSPITAL_COMMUNITY): Payer: Self-pay | Admitting: Internal Medicine

## 2023-03-21 ENCOUNTER — Other Ambulatory Visit: Payer: Self-pay | Admitting: Internal Medicine

## 2023-03-21 ENCOUNTER — Other Ambulatory Visit (HOSPITAL_COMMUNITY): Payer: Self-pay | Admitting: Psychiatry

## 2023-03-21 ENCOUNTER — Other Ambulatory Visit: Payer: Self-pay | Admitting: Family

## 2023-03-21 DIAGNOSIS — F4310 Post-traumatic stress disorder, unspecified: Secondary | ICD-10-CM

## 2023-03-21 DIAGNOSIS — F331 Major depressive disorder, recurrent, moderate: Secondary | ICD-10-CM

## 2023-03-23 ENCOUNTER — Inpatient Hospital Stay: Payer: 59 | Attending: Nurse Practitioner

## 2023-03-23 VITALS — BP 123/92 | HR 69 | Temp 98.0°F | Resp 16

## 2023-03-23 DIAGNOSIS — E538 Deficiency of other specified B group vitamins: Secondary | ICD-10-CM | POA: Diagnosis not present

## 2023-03-23 DIAGNOSIS — D508 Other iron deficiency anemias: Secondary | ICD-10-CM

## 2023-03-23 MED ORDER — CYANOCOBALAMIN 1000 MCG/ML IJ SOLN
1000.0000 ug | INTRAMUSCULAR | Status: DC
  Administered 2023-03-23: 1000 ug via INTRAMUSCULAR
  Filled 2023-03-23: qty 1

## 2023-03-25 ENCOUNTER — Ambulatory Visit
Admission: RE | Admit: 2023-03-25 | Discharge: 2023-03-25 | Disposition: A | Discharge status: Home / Self Care | Payer: 59 | Source: Ambulatory Visit | Attending: Family Medicine | Admitting: Family Medicine

## 2023-03-25 DIAGNOSIS — Z0000 Encounter for general adult medical examination without abnormal findings: Secondary | ICD-10-CM

## 2023-03-25 DIAGNOSIS — Z1231 Encounter for screening mammogram for malignant neoplasm of breast: Secondary | ICD-10-CM | POA: Diagnosis not present

## 2023-03-30 ENCOUNTER — Encounter: Payer: Self-pay | Admitting: Neurology

## 2023-03-30 ENCOUNTER — Ambulatory Visit (INDEPENDENT_AMBULATORY_CARE_PROVIDER_SITE_OTHER): Payer: 59 | Admitting: *Deleted

## 2023-03-30 DIAGNOSIS — J309 Allergic rhinitis, unspecified: Secondary | ICD-10-CM

## 2023-04-20 ENCOUNTER — Inpatient Hospital Stay: Payer: 59 | Attending: Nurse Practitioner

## 2023-04-20 VITALS — BP 121/77 | HR 84 | Temp 98.0°F | Resp 17

## 2023-04-20 DIAGNOSIS — E538 Deficiency of other specified B group vitamins: Secondary | ICD-10-CM | POA: Diagnosis not present

## 2023-04-20 DIAGNOSIS — Z79899 Other long term (current) drug therapy: Secondary | ICD-10-CM | POA: Diagnosis not present

## 2023-04-20 DIAGNOSIS — D508 Other iron deficiency anemias: Secondary | ICD-10-CM

## 2023-04-20 MED ORDER — CYANOCOBALAMIN 1000 MCG/ML IJ SOLN
1000.0000 ug | INTRAMUSCULAR | Status: DC
  Administered 2023-04-20: 1000 ug via INTRAMUSCULAR
  Filled 2023-04-20: qty 1

## 2023-04-29 ENCOUNTER — Ambulatory Visit: Payer: 59 | Admitting: Internal Medicine

## 2023-04-29 ENCOUNTER — Encounter: Payer: Self-pay | Admitting: Internal Medicine

## 2023-04-29 VITALS — BP 118/82 | HR 76 | Temp 98.3°F

## 2023-04-29 DIAGNOSIS — J3089 Other allergic rhinitis: Secondary | ICD-10-CM | POA: Diagnosis not present

## 2023-04-29 DIAGNOSIS — J4540 Moderate persistent asthma, uncomplicated: Secondary | ICD-10-CM

## 2023-04-29 DIAGNOSIS — T7800XD Anaphylactic reaction due to unspecified food, subsequent encounter: Secondary | ICD-10-CM | POA: Diagnosis not present

## 2023-04-29 DIAGNOSIS — H918X3 Other specified hearing loss, bilateral: Secondary | ICD-10-CM

## 2023-04-29 DIAGNOSIS — K219 Gastro-esophageal reflux disease without esophagitis: Secondary | ICD-10-CM

## 2023-04-29 DIAGNOSIS — T7800XA Anaphylactic reaction due to unspecified food, initial encounter: Secondary | ICD-10-CM

## 2023-04-29 DIAGNOSIS — R42 Dizziness and giddiness: Secondary | ICD-10-CM | POA: Diagnosis not present

## 2023-04-29 MED ORDER — CROMOLYN SODIUM 4 % OP SOLN: 1.0000 [drp] | Freq: Four times a day (QID) | OPHTHALMIC | 12 refills | Status: DC

## 2023-04-29 NOTE — Patient Instructions (Addendum)
Perennial Rhinitis: improved  - Continue with: Xyzal (levocetirizine) 5mg  tablet once daily and Flonase (fluticasone) two sprays per nostril daily,  Astelin (azelastine) 2 sprays per nostril 1-2 times daily as needed  Start  cromolyn eye drops:1 drop per eye up to 4 times a day as needed  - You can use an extra dose of the antihistamine, if needed, for breakthrough symptoms.  - Consider nasal saline rinses 1-2 times daily to remove allergens from the nasal cavities as well as help with mucous clearance (this is especially helpful to do before the nasal sprays are given) -Continue allergy injections and carry EpiPen on allergy injection today   Food allergy:  - please strictly avoid seafood given history of reactions.  - for SKIN only reaction, okay to take Benadryl 1 capsules every 6 hours - for SKIN + ANY additional symptoms, OR IF concern for LIFE THREATENING reaction = Epipen Autoinjector EpiPen 0.3 mg. - If using Epinephrine autoinjector, call 911 - A food allergy action plan has been provided and discussed. - Medic Alert identification is recommended.  Vertigo  - Will refer to ENT    Moderate Persistent Asthma: well  controlled - Breathing test today showed: looked great!  PLAN:  - Spacer use reviewed. - Daily controller medication(s): Symbicort 160/4.20mcg two puffs once daily - Prior to physical activity: albuterol 2 puffs 10-15 minutes before physical activity. - Rescue medications: albuterol 4 puffs every 4-6 hours as needed - Changes during respiratory infections or worsening symptoms: Increase Symbicort to 3 puffs twice daily for TWO WEEKS. - Asthma control goals:  * Full participation in all desired activities (may need albuterol before activity) * Albuterol use two time or less a week on average (not counting use with activity) * Cough interfering with sleep two time or less a month * Oral steroids no more than once a year * No hospitalizations    GERD  - Continue  pantoprazole 40mg  daily (take 30 minutes before you eat)  - Continue famotidine 20mg  at night  - Will refer to GI for persistent symptoms   Follow up: in 6 months   Thank you so much for letting me partake in your care today.  Don't hesitate to reach out if you have any additional concerns!  Ferol Luz, MD  [facility] [place] [place]

## 2023-04-29 NOTE — Progress Notes (Signed)
Follow Up Note  RE: Melissa Gaines MRN: [id number] DOB: November 03, 1970 Date of Office Visit: 04/29/2023  Referring provider: Levin Erp, MD Primary care provider: Levin Erp, MD  Chief Complaint: Asthma (Good days and bad days)  History of Present Illness: I had the pleasure of seeing Melissa Gaines for a follow up visit at the [facility] on 04/29/2023. She is a 53 y.o. female, who is being followed for asthma, gerd, food allergy allergic rhinitis on AIT, migraines . Her previous allergy office visit was on 11/05/22 with Dr. Marlynn Perking. Today is a regular follow up visit.  History obtained from patient, chart review.  Main complaint today is dizziness, room spinning.  When this occurs she has noted some dyspnea.  She has a history of hearing loss and wears hearing aids.  She has not seen ENT.  She does not drive.  Asthma is well controlled on symbicort 2 puffs twice daily  Denies any ED/OCS/UC/ABX since last visit.  Will need albuterol for coughing with cat exposure.  At last visit: FEV1: 2.26L, 109% predicted  She reports increased cough, sneezing, red eyes with cat exposure  Currently on on AIT to mold.  Denies any SR/LR.  On benadryl and levocetrizine, flonase 1 SEN daily., Astelin 1 SEN daily, Has restasis eye drops, but no allergy eye drops .  Unsure if she did not pick up her Pataday which was prescribed last time  GERD: still has night time symptoms. Last seen GI 2 years ago.  Has had an EGD but procedure discontinued due to asthma exacerbation during procedure.  She reports compliance with Protonix 40 mg in the morning, famotidine 20 mg at night.  She continues to avoid all seafood. No accidental ingestions or reacitons since last visit. She is not interested in food challenge.  She does have an epipen.   Pertinent History/Diagnostics:  - Asthma: moderate persistent, dx'd after 2nd pregnancy, triggered by rhinitis,  exercise, URI   - no hospitalizations or intubations   - normal spirometry (09/25/21): ratio 98%, 2.02L, 97% FEV1 (pre), - Allergic Rhinitis:   - SPT environmental panel (09/26/22): Mold   - AIT started 10/15/21 Vial 1 (mold)  - Food Allergy (salmon and tilapia)  - Hx of reaction:  urticaria, throat tightness, facial angioedema   - SPT select foods (09/26/22): negative,   - sIgE (09/26/22) negative   - offered food challenge, but patient declined  - GERD: persistent   - switched from omeprazole to pantoprazole and famotidine 08/06/22    Assessment and Plan: Melissa Gaines is a 53 y.o. female with: Other allergic rhinitis  Vertigo - Plan: Ambulatory referral to ENT  Gastroesophageal reflux disease without esophagitis - Plan: Ambulatory referral to Gastroenterology  Allergy with anaphylaxis due to food  Moderate persistent asthma without complication - Plan: Spirometry with Graph  Other specified hearing loss of both ears   Plan: Patient Instructions  Perennial Rhinitis: improved  - Continue with: Xyzal (levocetirizine) 5mg  tablet once daily and Flonase (fluticasone) two sprays per nostril daily,  Astelin (azelastine) 2 sprays per nostril 1-2 times daily as needed  Start  cromolyn eye drops:1 drop per eye up to 4 times a day as needed  - You can use an extra dose of the antihistamine, if needed, for breakthrough symptoms.  - Consider nasal saline rinses 1-2 times daily to remove allergens from the nasal cavities as well as help with mucous clearance (this is especially helpful to do before  the nasal sprays are given) -Continue allergy injections and carry EpiPen on allergy injection today   Food allergy:  - please strictly avoid seafood given history of reactions.  - for SKIN only reaction, okay to take Benadryl 1 capsules every 6 hours - for SKIN + ANY additional symptoms, OR IF concern for LIFE THREATENING reaction = Epipen Autoinjector EpiPen 0.3 mg. - If using Epinephrine autoinjector,  call 911 - A food allergy action plan has been provided and discussed. - Medic Alert identification is recommended.  Vertigo  - Will refer to ENT    Moderate Persistent Asthma: well  controlled - Breathing test today showed: looked great!  PLAN:  - Spacer use reviewed. - Daily controller medication(s): Symbicort 160/4.81mcg two puffs once daily - Prior to physical activity: albuterol 2 puffs 10-15 minutes before physical activity. - Rescue medications: albuterol 4 puffs every 4-6 hours as needed - Changes during respiratory infections or worsening symptoms: Increase Symbicort to 3 puffs twice daily for TWO WEEKS. - Asthma control goals:  * Full participation in all desired activities (may need albuterol before activity) * Albuterol use two time or less a week on average (not counting use with activity) * Cough interfering with sleep two time or less a month * Oral steroids no more than once a year * No hospitalizations    GERD  - Continue pantoprazole 40mg  daily (take 30 minutes before you eat)  - Continue famotidine 20mg  at night  - Will refer to GI for persistent symptoms   Follow up: in 6 months   Thank you so much for letting me partake in your care today.  Don't hesitate to reach out if you have any additional concerns!  Ferol Luz, MD  [facility] [place] [place]   Meds ordered this encounter  Medications   cromolyn (OPTICROM) 4 % ophthalmic solution    Sig: Place 1 drop into both eyes 4 (four) times daily.    Dispense:  10 mL    Refill:  12    Lab Orders  No laboratory test(s) ordered today    Diagnostics: Spirometry:  Tracings reviewed. Her effort: Good reproducible efforts. FVC: 2.90L FEV1: 2.31L, 112% predicted FEV1/FVC ratio: 80% Interpretation: Spirometry consistent with normal pattern.  Please see scanned spirometry results for details.     Medication List:  Current Outpatient Medications  Medication Sig Dispense Refill    acetaminophen (TYLENOL) 500 MG tablet Take 500 mg by mouth every 6 (six) hours as needed for headache.     acetaminophen-codeine (TYLENOL #3) 300-30 MG tablet Take 1-2 tablets by mouth every 4 (four) hours as needed for moderate pain.     albuterol (VENTOLIN HFA) 108 (90 Base) MCG/ACT inhaler Inhale 2 puffs into the lungs every 6 (six) hours as needed for wheezing or shortness of breath. 8 g 2   atorvastatin (LIPITOR) 80 MG tablet Take 1 tablet (80 mg total) by mouth daily. 90 tablet 3   azelastine (ASTELIN) 0.1 % nasal spray Place 2 sprays into both nostrils 2 (two) times daily. Use in each nostril as directed 30 mL 12   budesonide-formoterol (SYMBICORT) 160-4.5 MCG/ACT inhaler Inhale 2 puffs into the lungs daily. 1 each 12   cetirizine (ZYRTEC) 10 MG tablet Take 1 tablet (10 mg total) by mouth daily. 30 tablet 11   cromolyn (OPTICROM) 4 % ophthalmic solution Place 1 drop into both eyes 4 (four) times daily. 10 mL 12   cyanocobalamin (,VITAMIN B-12,) 1000 MCG/ML injection Inject  1 mL (1,000 mcg total) into the skin every 30 (thirty) days. 5 mL 0   cycloSPORINE (RESTASIS) 0.05 % ophthalmic emulsion Place 1 drop into both eyes 2 (two) times daily. 0.4 mL 0   diclofenac Sodium (VOLTAREN ARTHRITIS PAIN) 1 % GEL Apply 4 g topically 4 (four) times daily. 350 g 0   ELIQUIS 5 MG TABS tablet TAKE 1 TABLET BY MOUTH TWICE  DAILY NEEDS PCP APPOINTMENT  BEFORE MORE FILLS 60 tablet 11   EPINEPHrine 0.3 mg/0.3 mL IJ SOAJ injection INJECT INTRAMUSCULARLY 1 PEN AS  NEEDED FOR ALLERGIC RESPONSE AS  DIRECTED BY MD. SEEK MEDICAL  HELP AFTER USE. 4 each 1   Erenumab-aooe (AIMOVIG) 140 MG/ML SOAJ Inject 140 mg into the skin every 30 (thirty) days. 1.12 mL 11   famotidine (PEPCID) 20 MG tablet TAKE 1 TABLET BY MOUTH TWICE  DAILY 180 tablet 3   ferrous sulfate 325 (65 FE) MG tablet Take 1 tablet (325 mg total) by mouth 3 (three) times daily with meals. 90 tablet 1   FLUoxetine (PROZAC) 20 MG capsule Take 1 capsule (20  mg total) by mouth daily. 90 capsule 0   fluticasone (FLONASE) 50 MCG/ACT nasal spray Place 2 sprays into both nostrils daily. 16 g 6   gabapentin (NEURONTIN) 300 MG capsule Take 1 capsule (300 mg total) by mouth at bedtime. 90 capsule 0   levocetirizine (XYZAL) 5 MG tablet TAKE 1 TABLET BY MOUTH IN THE  EVENING 90 tablet 0   pantoprazole (PROTONIX) 40 MG tablet Take 1 tablet (40 mg total) by mouth daily. 30 tablet 5   SYRINGE/NEEDLE, DISP, 1 ML 25G X 5/8" 1 ML MISC Match with B12 3 each 3   traZODone (DESYREL) 100 MG tablet Take 1 tablet (100 mg total) by mouth at bedtime. 90 tablet 0   Ubrogepant (UBRELVY) 100 MG TABS Take 1 tablet (100 mg total) by mouth as needed (May repeat in 2 hours.  Maximum 2 tablets in 24 hours). 10 tablet 11   Vitamin D, Ergocalciferol, (DRISDOL) 50000 units CAPS capsule Take 1 capsule (50,000 Units total) by mouth every 7 (seven) days. 8 capsule 0   No current facility-administered medications for this visit.   Allergies: Allergies  Allergen Reactions   Fish Allergy 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 Throat closes and short of breath.   I reviewed her past medical history, social history, family history, and environmental history and no significant changes have been reported from her previous visit.  ROS: All others negative except as noted per HPI.   Objective: BP 118/82 (BP Location: Right Arm, Patient Position: Sitting, Cuff Size: Large)   Pulse 76   Temp 98.3 F (36.8 C) (Temporal)   SpO2 94%  There is no height or weight on file to calculate BMI. General Appearance:  Alert, cooperative, no distress, appears stated age  Head:  Normocephalic, without obvious abnormality, atraumatic  Eyes:  Conjunctiva slightly injected EOM's intact  Nose: Nares normal, hypertrophic turbinates, normal mucosa, no visible anterior polyps, and septum midline  Throat: Lips, tongue normal; teeth and gums normal, normal posterior oropharynx  Neck: Supple, symmetrical   Lungs:   clear to auscultation bilaterally, Respirations unlabored, no coughing  Heart:  regular rate and rhythm and no murmur, Appears well perfused  Extremities: No edema  Skin: Skin color, texture, turgor normal, no rashes or lesions on visualized portions of skin  Neurologic: No gross deficits   Previous notes and tests were reviewed. The  plan was reviewed with the patient/family, and all questions/concerned were addressed.  It was my pleasure to see Melissa Gaines today and participate in her care. Please feel free to contact me with any questions or concerns.  Sincerely,  Ferol Luz, MD  Allergy & Immunology  [facility] Office: [phone]

## 2023-05-09 ENCOUNTER — Ambulatory Visit: Payer: 59 | Admitting: Neurology

## 2023-05-15 ENCOUNTER — Other Ambulatory Visit: Payer: Self-pay | Admitting: Internal Medicine

## 2023-05-16 NOTE — Progress Notes (Unsigned)
 NEUROLOGY FOLLOW UP OFFICE NOTE  Melissa Gaines [id number]  Assessment/Plan:   Migraine without aura, without status migrainosus, not intractable Chronic vertigo - Suspect cervicogenic as it flared up after onset of neck pain.  Consider migraine but headaches are so well controlled, I feel it would be less likely.   Left sided occipital neuralgia Pituitary microadenoma  Due to ongoing neck pain despite medication management and physical therapy, will check MRI of cervical spine without contrast to evaluate for structural cause that may guide treatment (e.g. epidural injections) Migraine prevention:  Aimovig 140mg   Migraine rescue:  Bernita Raisin 100mg  Follow up with ENT Limit use of pain relievers to no more than 2 days out of week to prevent risk of rebound or medication-overuse headache. Keep headache diary Follow up 6 months.   Subjective:  Melissa Gaines is a 53 year old woman with PTSD, anxiety, chronic dizziness, migraines, SLE and antiphospholipid antibody syndrome with history of PE on chronic anticoagulation who follows up for migraines.   UPDATE: Overall, migraines had been controlled. Intensity:  usually mild, sometimes moderate Duration:  With Ubrelvy, lasted 60 to 90 minutes.   Frequency:  2-3 days a month.   For acute neck pain, she was prescribed a prednisone taper and referred to PT.  It helped a little bit.  Continues to do home neck stretches.  Had a flare up a couple of weeks ago but otherwise it has been manageable.    History of vertigo off and on.  Used to be infrequent.  Since the neck pain started last summer, they have been occurring 2 to 4 times a week, lasting all day.  Spells are not associated with headache.  She was referred to ENT by her PCP.     Current NSAIDS:  Tylenol, naproxen (most days a week) Current analgesics:  None Current triptans:  none Current ergotamine:  none Current anti-emetic:  none Current muscle  relaxants:  none Current anti-anxiolytic:  hydroxyzine Current sleep aide:  none Current Antihypertensive medications:  metoprolol Current Antidepressant medications:  Paxil 40mg  Current Anticonvulsant medications: gabapentin 300mg  QHS Current anti-CGRP:  Aimovig 140mg , Ubrelvy 100mg  Current Vitamins/Herbal/Supplements:  D, folic acid, B12 Current Antihistamines/Decongestants:  none Other therapy:  Icy-cold  Hormone/birth control:  none Other medications:  Eliquis, methotrexate   Caffeine:  No coffee.  Pepsi daily. Alcohol:  no Smoker:  no Diet:  4 glasses of water daily.  Skips meals Exercise:  no Depression:  yes; Anxiety:  yes Other pain:  Joint pain, back pain, lumbar radicular pain Sleep hygiene:  varies   HISTORY:  Onset:  Mild migraines in 1990s but worse since birth of son in 2010 Location:  Left temple radiating down to back of head Quality:  pounding, shocks Initial Intensity:  Severe.  She denies new headache, thunderclap headache Aura:  no Premonitory Phase:  no Postdrome:  no Associated symptoms:  Photophobia, phonophobia, dizziness, blurred vision, stuttering speech. If severe, she has memory deficits such as difficulty spelling her name or remembering her social security number.  She denies associated nausea, vomiting,  unilateral numbness or weakness. Initial Duration:  Varies.  1 hour to several hours.  Last month, she had one that lasted 5 days. Initial Frequency:  Daily Initial Frequency of abortive medication: ASA and Tylenol daily Triggers:  bright sunlight Relieving factors:  Resting in dark quiet room, cold packs Activity:  aggravates   She reports increased electric shock pain in the left temple radiating to back of  head, lasting 1 to hours with photophobia and phonophobia, occurring twice a month.  On 10/25/2022, she may have picked up something heavy but the next day developed left suboccipital/upper cervical region radiating to mid  cervical/suboccipital region, sometimes radiating to right side.  If she turns her head from left side to right side, pain is aggravated.  Looking down makes her dizzy.  No pain down the arm but sometimes numbness in the fingers, however she sometimes has numbness in fingers mostly on the right while sleeping.  No weakness.     Past NSAIDS:  ibuprofen, ASA 325mg  Past analgesics:  Extra-strength Tylenol Past abortive triptans:  rizatriptan, sumatriptan tab Past abortive ergotamine:  none Past muscle relaxants:  none Past anti-emetic:  none Past antihypertensive medications:  none Past antidepressant medications:  none Past anticonvulsant medications:  topiramate 100mg  Past anti-CGRP:  none Past vitamins/Herbal/Supplements:  none Past antihistamines/decongestants:  none Other past therapies:  none   Family history of headache:  Mom   Of note, she was told she had a 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  However, she followed up with another neurologist in [place] who performed a repeat brain MRI and was told that it was gone.  MRI of brain with and without contrast on 09/12/2020 showed subtle 4-5 mm nodularity at base of pituitary infundibulum possibly reflecting current or previous hypophysitis.  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  Otherwise, brain and pituitary gland unremarkable.  Pituitary microadenoma followed by endocrinology.  Repeat MRI of brain with and without contrast on 11/19/2021 showed stable small nodular enhancement of the pituitary infundibulum just below the hypothalamus.  Labs normal.  PAST MEDICAL HISTORY: Past Medical History:  Diagnosis Date   Anemia    Anxiety    Asthma    Chronic pain    Congenital hip dislocation    GERD (gastroesophageal reflux disease)    Iron deficiency anemia 04/14/2018   Kidney mass 2017   Migraine    Osteoporosis    Pathological dislocation of shoulder joint, bilateral     congential   PTSD (post-traumatic stress disorder)    Sleep apnea    Systemic lupus erythematosus (HCC)    Urticaria     MEDICATIONS: Current Outpatient Medications on File Prior to Visit  Medication Sig Dispense Refill   acetaminophen (TYLENOL) 500 MG tablet Take 500 mg by mouth every 6 (six) hours as needed for headache.     acetaminophen-codeine (TYLENOL #3) 300-30 MG tablet Take 1-2 tablets by mouth every 4 (four) hours as needed for moderate pain.     albuterol (VENTOLIN HFA) 108 (90 Base) MCG/ACT inhaler Inhale 2 puffs into the lungs every 6 (six) hours as needed for wheezing or shortness of breath. 8 g 2   atorvastatin (LIPITOR) 80 MG tablet Take 1 tablet (80 mg total) by mouth daily. 90 tablet 3   azelastine (ASTELIN) 0.1 % nasal spray Place 2 sprays into both nostrils 2 (two) times daily. Use in each nostril as directed 30 mL 12   budesonide-formoterol (SYMBICORT) 160-4.5 MCG/ACT inhaler Inhale 2 puffs into the lungs daily. 1 each 12   cetirizine (ZYRTEC) 10 MG tablet Take 1 tablet (10 mg total) by mouth daily. 30 tablet 11   cromolyn (OPTICROM) 4 % ophthalmic solution Place 1 drop into both eyes 4 (four) times daily. 10 mL 12   cyanocobalamin (,VITAMIN B-12,) 1000 MCG/ML injection Inject 1 mL (1,000 mcg total) into the  skin every 30 (thirty) days. 5 mL 0   cycloSPORINE (RESTASIS) 0.05 % ophthalmic emulsion Place 1 drop into both eyes 2 (two) times daily. 0.4 mL 0   diclofenac Sodium (VOLTAREN ARTHRITIS PAIN) 1 % GEL Apply 4 g topically 4 (four) times daily. 350 g 0   ELIQUIS 5 MG TABS tablet TAKE 1 TABLET BY MOUTH TWICE  DAILY NEEDS PCP APPOINTMENT  BEFORE MORE FILLS 60 tablet 11   EPINEPHrine 0.3 mg/0.3 mL IJ SOAJ injection INJECT INTRAMUSCULARLY 1 PEN AS  NEEDED FOR ALLERGIC RESPONSE AS  DIRECTED BY MD. SEEK MEDICAL  HELP AFTER USE. 4 each 1   Erenumab-aooe (AIMOVIG) 140 MG/ML SOAJ Inject 140 mg into the skin every 30 (thirty) days. 1.12 mL 11   famotidine (PEPCID) 20 MG tablet  TAKE 1 TABLET BY MOUTH TWICE  DAILY 180 tablet 3   ferrous sulfate 325 (65 FE) MG tablet Take 1 tablet (325 mg total) by mouth 3 (three) times daily with meals. 90 tablet 1   FLUoxetine (PROZAC) 20 MG capsule Take 1 capsule (20 mg total) by mouth daily. 90 capsule 0   fluticasone (FLONASE) 50 MCG/ACT nasal spray Place 2 sprays into both nostrils daily. 16 g 6   gabapentin (NEURONTIN) 300 MG capsule Take 1 capsule (300 mg total) by mouth at bedtime. 90 capsule 0   levocetirizine (XYZAL) 5 MG tablet TAKE 1 TABLET BY MOUTH IN THE  EVENING 90 tablet 0   pantoprazole (PROTONIX) 40 MG tablet Take 1 tablet (40 mg total) by mouth daily. 30 tablet 5   SYRINGE/NEEDLE, DISP, 1 ML 25G X 5/8" 1 ML MISC Match with B12 3 each 3   traZODone (DESYREL) 100 MG tablet Take 1 tablet (100 mg total) by mouth at bedtime. 90 tablet 0   Ubrogepant (UBRELVY) 100 MG TABS Take 1 tablet (100 mg total) by mouth as needed (May repeat in 2 hours.  Maximum 2 tablets in 24 hours). 10 tablet 11   Vitamin D, Ergocalciferol, (DRISDOL) 50000 units CAPS capsule Take 1 capsule (50,000 Units total) by mouth every 7 (seven) days. 8 capsule 0   No current facility-administered medications on file prior to visit.    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 Throat closes and short of breath.    FAMILY HISTORY: Family History  Problem Relation Age of Onset   Asthma Mother    Liver cancer Mother    Heart Problems Mother        heart attack and heart failure   Kidney Stones Mother    Osteoporosis Mother    Stroke Mother    Depression Mother    Anxiety disorder Mother    Kidney Stones Father    Hernia Father    Kidney disease Father    Depression Sister    Anxiety disorder Sister    Asthma Sister    Cancer Maternal Grandmother    Cancer Paternal Grandmother    Cancer Other       Objective:  Blood pressure 111/69, pulse 80, height 4\' 10"  (1.473 m), weight 209 lb 12.8  oz (95.2 kg), SpO2 95%. General: No acute distress.  Patient appears well-groomed.       Shon Millet, DO  CC: Levin Erp, MD

## 2023-05-17 ENCOUNTER — Encounter: Payer: Self-pay | Admitting: Neurology

## 2023-05-17 ENCOUNTER — Ambulatory Visit (INDEPENDENT_AMBULATORY_CARE_PROVIDER_SITE_OTHER): Payer: 59 | Admitting: Neurology

## 2023-05-17 ENCOUNTER — Other Ambulatory Visit: Payer: Self-pay

## 2023-05-17 VITALS — BP 111/69 | HR 80 | Ht <= 58 in | Wt 209.8 lb

## 2023-05-17 DIAGNOSIS — M5481 Occipital neuralgia: Secondary | ICD-10-CM

## 2023-05-17 DIAGNOSIS — G43009 Migraine without aura, not intractable, without status migrainosus: Secondary | ICD-10-CM

## 2023-05-17 DIAGNOSIS — D500 Iron deficiency anemia secondary to blood loss (chronic): Secondary | ICD-10-CM

## 2023-05-17 DIAGNOSIS — M542 Cervicalgia: Secondary | ICD-10-CM

## 2023-05-17 DIAGNOSIS — R42 Dizziness and giddiness: Secondary | ICD-10-CM

## 2023-05-17 MED ORDER — AIMOVIG 140 MG/ML ~~LOC~~ SOAJ: 140.0000 mg | SUBCUTANEOUS | 11 refills | Status: AC

## 2023-05-17 MED ORDER — UBRELVY 100 MG PO TABS: 1.0000 | ORAL_TABLET | ORAL | 11 refills | Status: DC | PRN

## 2023-05-17 NOTE — Patient Instructions (Signed)
 Due to ongoing neck pain and vertigo, will check MRI of cervical spine without contrast Continue Aimovig and Ubrelvy Follow up with ENT to rule out other cause of vertigo Otherwise, follow up 6 months.

## 2023-05-17 NOTE — Assessment & Plan Note (Signed)
secondary to previous gastric bypass surgery -developed symptomatic deficiency in 10/2018, started monthly B12 inj. Level remains in normal range -continue monthly injections at [facility], her insurance does not cover home administration

## 2023-05-17 NOTE — Assessment & Plan Note (Signed)
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 She has required IV Feraheme since 04/2018 to keep goal ferritin >50 -she is currently taking oral iron  -she is clinically stable. Lab reviewed

## 2023-05-18 ENCOUNTER — Inpatient Hospital Stay: Payer: 59

## 2023-05-18 ENCOUNTER — Other Ambulatory Visit (HOSPITAL_COMMUNITY): Payer: Self-pay | Admitting: Psychiatry

## 2023-05-18 ENCOUNTER — Encounter: Payer: Self-pay | Admitting: Hematology

## 2023-05-18 ENCOUNTER — Inpatient Hospital Stay: Payer: 59 | Attending: Nurse Practitioner

## 2023-05-18 ENCOUNTER — Encounter (HOSPITAL_COMMUNITY): Payer: Self-pay | Admitting: Internal Medicine

## 2023-05-18 ENCOUNTER — Inpatient Hospital Stay (HOSPITAL_BASED_OUTPATIENT_CLINIC_OR_DEPARTMENT_OTHER): Payer: 59 | Admitting: Hematology

## 2023-05-18 VITALS — BP 138/88 | HR 75 | Temp 97.6°F | Resp 17 | Ht <= 58 in | Wt 209.2 lb

## 2023-05-18 DIAGNOSIS — D508 Other iron deficiency anemias: Secondary | ICD-10-CM | POA: Diagnosis not present

## 2023-05-18 DIAGNOSIS — N920 Excessive and frequent menstruation with regular cycle: Secondary | ICD-10-CM | POA: Insufficient documentation

## 2023-05-18 DIAGNOSIS — F4310 Post-traumatic stress disorder, unspecified: Secondary | ICD-10-CM

## 2023-05-18 DIAGNOSIS — E538 Deficiency of other specified B group vitamins: Secondary | ICD-10-CM

## 2023-05-18 DIAGNOSIS — F331 Major depressive disorder, recurrent, moderate: Secondary | ICD-10-CM

## 2023-05-18 DIAGNOSIS — Z9884 Bariatric surgery status: Secondary | ICD-10-CM | POA: Diagnosis not present

## 2023-05-18 DIAGNOSIS — K912 Postsurgical malabsorption, not elsewhere classified: Secondary | ICD-10-CM | POA: Insufficient documentation

## 2023-05-18 DIAGNOSIS — D500 Iron deficiency anemia secondary to blood loss (chronic): Secondary | ICD-10-CM | POA: Diagnosis not present

## 2023-05-18 LAB — CMP (CANCER CENTER ONLY)
ALT: 15 U/L (ref 0–44)
AST: 16 U/L (ref 15–41)
Albumin: 4.3 g/dL (ref 3.5–5.0)
Alkaline Phosphatase: 76 U/L (ref 38–126)
Anion gap: 8 (ref 5–15)
BUN: 8 mg/dL (ref 6–20)
CO2: 25 mmol/L (ref 22–32)
Calcium: 9.1 mg/dL (ref 8.9–10.3)
Chloride: 105 mmol/L (ref 98–111)
Creatinine: 0.55 mg/dL (ref 0.44–1.00)
GFR, Estimated: >60 mL/min (ref >60)
Glucose, Bld: 88 mg/dL (ref 70–99)
Potassium: 4 mmol/L (ref 3.5–5.1)
Sodium: 138 mmol/L (ref 135–145)
Total Bilirubin: 0.4 mg/dL (ref 0.0–1.2)
Total Protein: 7.8 g/dL (ref 6.5–8.1)

## 2023-05-18 LAB — CBC WITH DIFFERENTIAL (CANCER CENTER ONLY)
Abs Immature Granulocytes: 0.02 10*3/uL (ref 0.00–0.07)
Basophils Absolute: 0 10*3/uL (ref 0.0–0.1)
Basophils Relative: 1 %
Eosinophils Absolute: 0.1 10*3/uL (ref 0.0–0.5)
Eosinophils Relative: 3 %
HCT: 44 % (ref 36.0–46.0)
Hemoglobin: 14.8 g/dL (ref 12.0–15.0)
Immature Granulocytes: 0 %
Lymphocytes Relative: 41 %
Lymphs Abs: 2.2 10*3/uL (ref 0.7–4.0)
MCH: 28.4 pg (ref 26.0–34.0)
MCHC: 33.6 g/dL (ref 30.0–36.0)
MCV: 84.3 fL (ref 80.0–100.0)
Monocytes Absolute: 0.3 10*3/uL (ref 0.1–1.0)
Monocytes Relative: 6 %
Neutro Abs: 2.7 10*3/uL (ref 1.7–7.7)
Neutrophils Relative %: 49 %
Platelet Count: 309 10*3/uL (ref 150–400)
RBC: 5.22 MIL/uL — ABNORMAL HIGH (ref 3.87–5.11)
RDW: 14 % (ref 11.5–15.5)
WBC Count: 5.5 10*3/uL (ref 4.0–10.5)
nRBC: 0 % (ref 0.0–0.2)

## 2023-05-18 LAB — IRON AND IRON BINDING CAPACITY (CC-WL,HP ONLY)
Iron: 50 ug/dL (ref 28–170)
Saturation Ratios: 12 % (ref 10.4–31.8)
TIBC: 424 ug/dL (ref 250–450)
UIBC: 374 ug/dL (ref 148–442)

## 2023-05-18 LAB — FERRITIN: Ferritin: 31 ng/mL (ref 11–307)

## 2023-05-18 MED ORDER — CYANOCOBALAMIN 1000 MCG/ML IJ SOLN
1000.0000 ug | INTRAMUSCULAR | Status: DC
Start: 2023-05-18 — End: 2023-05-18
  Administered 2023-05-18: 1000 ug via INTRAMUSCULAR
  Filled 2023-05-18: qty 1

## 2023-05-18 NOTE — Progress Notes (Signed)
 [facility]   [phone] [phone]   Clinic Follow up Note   Patient Care Team: Levin Erp, MD as PCP - General (Family Medicine) Jodelle Red, MD as PCP - Cardiology (Cardiology) Drema Dallas, DO as Consulting Physician (Neurology) Malachy Mood, MD as Consulting Physician (Hematology and Oncology) Cleotis Nipper, MD as Consulting Physician (Psychiatry)  Date of Service:  05/18/2023  CHIEF COMPLAINT: f/u of nutritional anemia  CURRENT THERAPY:  Oral iron and B12 injection monthly  Oncology History   Iron deficiency anemia Due to chronic blood loss from menorrhagia and 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 She has required IV Feraheme since 04/2018 to keep goal ferritin >50 -she is currently taking oral iron  -she is clinically stable. Lab reviewed  B12 deficiency secondary to previous gastric bypass surgery -developed symptomatic deficiency in 10/2018, started monthly B12 inj. Level remains in normal range -continue monthly injections at [facility], her insurance does not cover home administration   Assessment and Plan    Anemia Anemia is well-controlled with normal blood counts. B12 deficiency is managed with monthly B12 injections, and her last B12 level was normal. Anemia is likely related to intermittent menstrual periods, possibly due to perimenopause. Iron supplementation continues due to ongoing menstrual blood loss. - Continue monthly B12 injections at the clinic. - Monitor blood counts and B12 levels every six months. - Continue iron supplementation due to intermittent menstrual periods. Consider discontinuing iron if menstrual periods cease for more than six months.  B12 Deficiency B12 deficiency is effectively managed with monthly B12 injections. B12 levels are normal, indicating good control. She prefers clinic injections due to cost concerns with home  administration. - Continue monthly B12 injections at the clinic. - Monitor B12 levels every six months.  Lupus Lupus may contribute to arthritis symptoms. The specific relationship between lupus and arthritis was not clearly defined.  Arthritis Arthritis may be related to lupus. The specific type and management were not discussed in detail.  Plan -Lab reviewed, she is clinically doing well, will proceed to B12 injection today and continue monthly -Lab every 6 months -follow Up in 1 year -Daily oral iron.      Discussed the use of AI scribe software for clinical note transcription with the patient, who gave verbal consent to proceed.  History of Present Illness   Melissa Gaines, a 53 year old female with a history of lupus and possible arthritis, presents for a follow-up of anemia and B12 deficiency. She reports no significant changes in her energy levels over the past year. Melissa Gaines mentions experiencing neck pain and is scheduled for an MRI. She also mentions that she has been referred for a follow-up regarding her throat and ear, but the specifics of these issues are not discussed in detail. Melissa Gaines has been receiving monthly B12 injections and has not missed any doses. She also reports irregular menstrual periods, occurring every three to six months, and is unsure if this is due to menopause.         All other systems were reviewed with the patient and are negative.  MEDICAL HISTORY:  Past Medical History:  Diagnosis Date   Anemia    Anxiety    Asthma    Chronic pain    Congenital hip dislocation    GERD (gastroesophageal reflux disease)    Iron deficiency anemia 04/14/2018   Kidney mass 2017   Migraine    Osteoporosis    Pathological dislocation  of shoulder joint, bilateral    congential   PTSD (post-traumatic stress disorder)    Sleep apnea    Systemic lupus erythematosus (HCC)    Urticaria     SURGICAL HISTORY: Past Surgical History:  Procedure Laterality Date   CESAREAN  SECTION  2005   CESAREAN SECTION W/BTL  2010   HIP SURGERY     in childhood for congenital hip dislocation   LAPAROSCOPIC GASTRIC SLEEVE RESECTION  2014   STAPEDECTOMY  11/01/2011   L postauricular stapedectomy with CO2 laser and insertion of 6 x 4.75 mm SMart Piston   TOTAL HIP ARTHROPLASTY     05/2000 R, 11/2000 L    I have reviewed the social history and family history with the patient and they are unchanged from previous note.  ALLERGIES:  is allergic to fish allergy.  MEDICATIONS:  Current Outpatient Medications  Medication Sig Dispense Refill   acetaminophen (TYLENOL) 500 MG tablet Take 500 mg by mouth every 6 (six) hours as needed for headache.     acetaminophen-codeine (TYLENOL #3) 300-30 MG tablet Take 1-2 tablets by mouth every 4 (four) hours as needed for moderate pain.     albuterol (VENTOLIN HFA) 108 (90 Base) MCG/ACT inhaler Inhale 2 puffs into the lungs every 6 (six) hours as needed for wheezing or shortness of breath. 8 g 2   atorvastatin (LIPITOR) 80 MG tablet Take 1 tablet (80 mg total) by mouth daily. 90 tablet 3   azelastine (ASTELIN) 0.1 % nasal spray Place 2 sprays into both nostrils 2 (two) times daily. Use in each nostril as directed 30 mL 12   budesonide-formoterol (SYMBICORT) 160-4.5 MCG/ACT inhaler Inhale 2 puffs into the lungs daily. 1 each 12   cetirizine (ZYRTEC) 10 MG tablet Take 1 tablet (10 mg total) by mouth daily. 30 tablet 11   cromolyn (OPTICROM) 4 % ophthalmic solution Place 1 drop into both eyes 4 (four) times daily. 10 mL 12   cyanocobalamin (,VITAMIN B-12,) 1000 MCG/ML injection Inject 1 mL (1,000 mcg total) into the skin every 30 (thirty) days. 5 mL 0   cycloSPORINE (RESTASIS) 0.05 % ophthalmic emulsion Place 1 drop into both eyes 2 (two) times daily. 0.4 mL 0   diclofenac Sodium (VOLTAREN ARTHRITIS PAIN) 1 % GEL Apply 4 g topically 4 (four) times daily. 350 g 0   ELIQUIS 5 MG TABS tablet TAKE 1 TABLET BY MOUTH TWICE  DAILY NEEDS PCP APPOINTMENT   BEFORE MORE FILLS 60 tablet 11   EPINEPHrine 0.3 mg/0.3 mL IJ SOAJ injection INJECT INTRAMUSCULARLY 1 PEN AS  NEEDED FOR ALLERGIC RESPONSE AS  DIRECTED BY MD. SEEK MEDICAL  HELP AFTER USE. 4 each 1   Erenumab-aooe (AIMOVIG) 140 MG/ML SOAJ Inject 140 mg into the skin every 30 (thirty) days. 1.12 mL 11   famotidine (PEPCID) 20 MG tablet TAKE 1 TABLET BY MOUTH TWICE  DAILY 180 tablet 3   ferrous sulfate 325 (65 FE) MG tablet Take 1 tablet (325 mg total) by mouth 3 (three) times daily with meals. 90 tablet 1   FLUoxetine (PROZAC) 20 MG capsule Take 1 capsule (20 mg total) by mouth daily. 90 capsule 0   fluticasone (FLONASE) 50 MCG/ACT nasal spray Place 2 sprays into both nostrils daily. 16 g 6   gabapentin (NEURONTIN) 300 MG capsule Take 1 capsule (300 mg total) by mouth at bedtime. 90 capsule 0   levocetirizine (XYZAL) 5 MG tablet TAKE 1 TABLET BY MOUTH IN THE  EVENING 90 tablet 0  pantoprazole (PROTONIX) 40 MG tablet TAKE 1 TABLET BY MOUTH DAILY 80 tablet 3   SYRINGE/NEEDLE, DISP, 1 ML 25G X 5/8" 1 ML MISC Match with B12 3 each 3   traZODone (DESYREL) 100 MG tablet Take 1 tablet (100 mg total) by mouth at bedtime. 90 tablet 0   Ubrogepant (UBRELVY) 100 MG TABS Take 1 tablet (100 mg total) by mouth as needed (May repeat in 2 hours.  Maximum 2 tablets in 24 hours). 10 tablet 11   Vitamin D, Ergocalciferol, (DRISDOL) 50000 units CAPS capsule Take 1 capsule (50,000 Units total) by mouth every 7 (seven) days. 8 capsule 0   No current facility-administered medications for this visit.   Facility-Administered Medications Ordered in Other Visits  Medication Dose Route Frequency Provider Last Rate Last Admin   cyanocobalamin (VITAMIN B12) injection 1,000 mcg  1,000 mcg Intramuscular Q30 days Malachy Mood, MD   1,000 mcg at 05/18/23 1100    PHYSICAL EXAMINATION: ECOG PERFORMANCE STATUS: 0 - Asymptomatic  Vitals:   05/18/23 1037  BP: 138/88  Pulse: 75  Resp: 17  Temp: 97.6 F (36.4 C)   Wt Readings  from Last 3 Encounters:  05/18/23 209 lb 3.2 oz (94.9 kg)  05/17/23 209 lb 12.8 oz (95.2 kg)  02/25/23 214 lb (97.1 kg)     GENERAL:alert, no distress and comfortable SKIN: skin color, texture, turgor are normal, no rashes or significant lesions EYES: normal, Conjunctiva are pink and non-injected, sclera clear Musculoskeletal:no cyanosis of digits and no clubbing  NEURO: alert & oriented x 3 with fluent speech, no focal motor/sensory deficits    LABORATORY DATA:  I have reviewed the data as listed    Latest Ref Rng & Units 05/18/2023   10:16 AM 02/16/2023    9:25 AM 11/17/2022    9:42 AM  CBC  WBC 4.0 - 10.5 K/uL 5.5  7.3  6.4   Hemoglobin 12.0 - 15.0 g/dL 16.1  09.6  04.5   Hematocrit 36.0 - 46.0 % 44.0  44.3  42.0   Platelets 150 - 400 K/uL 309  284  307         Latest Ref Rng & Units 05/18/2023   10:16 AM 02/16/2023    9:25 AM 11/17/2022    9:42 AM  CMP  Glucose 70 - 99 mg/dL 88  99  90   BUN 6 - 20 mg/dL 8  7  11    Creatinine 0.44 - 1.00 mg/dL 4.09  8.11  9.14   Sodium 135 - 145 mmol/L 138  139  140   Potassium 3.5 - 5.1 mmol/L 4.0  3.9  4.1   Chloride 98 - 111 mmol/L 105  106  102   CO2 22 - 32 mmol/L 25  23  31    Calcium 8.9 - 10.3 mg/dL 9.1  9.4  9.5   Total Protein 6.5 - 8.1 g/dL 7.8  7.9  7.8   Total Bilirubin 0.0 - 1.2 mg/dL 0.4  0.4  0.4   Alkaline Phos 38 - 126 U/L 76  70  75   AST 15 - 41 U/L 16  12  10    ALT 0 - 44 U/L 15  14  9        RADIOGRAPHIC STUDIES: I have personally reviewed the radiological images as listed and agreed with the findings in the report. No results found.    No orders of the defined types were placed in this encounter.  All questions were answered. The patient  knows to call the clinic with any problems, questions or concerns. No barriers to learning was detected. The total time spent in the appointment was 15 minutes.     Malachy Mood, MD 05/18/2023

## 2023-05-19 ENCOUNTER — Other Ambulatory Visit (HOSPITAL_COMMUNITY): Payer: Self-pay | Admitting: Psychiatry

## 2023-05-19 DIAGNOSIS — F331 Major depressive disorder, recurrent, moderate: Secondary | ICD-10-CM

## 2023-05-19 DIAGNOSIS — F4310 Post-traumatic stress disorder, unspecified: Secondary | ICD-10-CM

## 2023-05-27 ENCOUNTER — Ambulatory Visit (INDEPENDENT_AMBULATORY_CARE_PROVIDER_SITE_OTHER): Payer: Self-pay

## 2023-05-27 DIAGNOSIS — J309 Allergic rhinitis, unspecified: Secondary | ICD-10-CM | POA: Diagnosis not present

## 2023-05-30 ENCOUNTER — Ambulatory Visit
Admission: RE | Admit: 2023-05-30 | Discharge: 2023-05-30 | Disposition: A | Discharge status: Home / Self Care | Source: Ambulatory Visit | Attending: Neurology

## 2023-05-30 DIAGNOSIS — M542 Cervicalgia: Secondary | ICD-10-CM

## 2023-05-30 DIAGNOSIS — M50221 Other cervical disc displacement at C4-C5 level: Secondary | ICD-10-CM | POA: Diagnosis not present

## 2023-05-30 DIAGNOSIS — M50222 Other cervical disc displacement at C5-C6 level: Secondary | ICD-10-CM | POA: Diagnosis not present

## 2023-05-30 DIAGNOSIS — M50223 Other cervical disc displacement at C6-C7 level: Secondary | ICD-10-CM | POA: Diagnosis not present

## 2023-05-30 DIAGNOSIS — M5021 Other cervical disc displacement,  high cervical region: Secondary | ICD-10-CM | POA: Diagnosis not present

## 2023-06-01 ENCOUNTER — Encounter: Payer: Self-pay | Admitting: Nurse Practitioner

## 2023-06-03 ENCOUNTER — Ambulatory Visit (INDEPENDENT_AMBULATORY_CARE_PROVIDER_SITE_OTHER): Payer: Self-pay

## 2023-06-03 DIAGNOSIS — J309 Allergic rhinitis, unspecified: Secondary | ICD-10-CM

## 2023-06-10 ENCOUNTER — Ambulatory Visit (INDEPENDENT_AMBULATORY_CARE_PROVIDER_SITE_OTHER)

## 2023-06-10 DIAGNOSIS — J309 Allergic rhinitis, unspecified: Secondary | ICD-10-CM

## 2023-06-15 ENCOUNTER — Inpatient Hospital Stay

## 2023-06-15 ENCOUNTER — Inpatient Hospital Stay: Attending: Nurse Practitioner

## 2023-06-15 DIAGNOSIS — E538 Deficiency of other specified B group vitamins: Secondary | ICD-10-CM | POA: Insufficient documentation

## 2023-06-15 DIAGNOSIS — D508 Other iron deficiency anemias: Secondary | ICD-10-CM

## 2023-06-15 MED ORDER — CYANOCOBALAMIN 1000 MCG/ML IJ SOLN
1000.0000 ug | INTRAMUSCULAR | Status: DC
Start: 2023-06-15 — End: 2023-06-15
  Administered 2023-06-15: 1000 ug via INTRAMUSCULAR
  Filled 2023-06-15: qty 1

## 2023-06-16 ENCOUNTER — Ambulatory Visit (HOSPITAL_BASED_OUTPATIENT_CLINIC_OR_DEPARTMENT_OTHER): Payer: 59 | Admitting: Psychiatry

## 2023-06-16 ENCOUNTER — Encounter (HOSPITAL_COMMUNITY): Payer: Self-pay

## 2023-06-16 ENCOUNTER — Encounter (HOSPITAL_COMMUNITY): Payer: Self-pay | Admitting: Psychiatry

## 2023-06-16 ENCOUNTER — Other Ambulatory Visit: Payer: Self-pay

## 2023-06-16 VITALS — BP 109/90 | HR 71 | Ht <= 58 in | Wt 200.0 lb

## 2023-06-16 DIAGNOSIS — F4310 Post-traumatic stress disorder, unspecified: Secondary | ICD-10-CM

## 2023-06-16 DIAGNOSIS — F331 Major depressive disorder, recurrent, moderate: Secondary | ICD-10-CM | POA: Diagnosis not present

## 2023-06-16 MED ORDER — TRAZODONE HCL 100 MG PO TABS: 100.0000 mg | ORAL_TABLET | Freq: Every day | ORAL | 0 refills | Status: DC

## 2023-06-16 MED ORDER — GABAPENTIN 400 MG PO CAPS: 400.0000 mg | ORAL_CAPSULE | Freq: Every day | ORAL | 0 refills | Status: DC

## 2023-06-16 MED ORDER — FLUOXETINE HCL 20 MG PO CAPS: 20.0000 mg | ORAL_CAPSULE | Freq: Every day | ORAL | 0 refills | Status: DC
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
## 2023-06-20 ENCOUNTER — Telehealth: Payer: Self-pay | Admitting: Neurology

## 2023-06-20 NOTE — Telephone Encounter (Signed)
 Pt called stating she received a call from our office. She thinks its about results.

## 2023-06-21 NOTE — Telephone Encounter (Signed)
 See result note.

## 2023-06-22 DIAGNOSIS — H905 Unspecified sensorineural hearing loss: Secondary | ICD-10-CM | POA: Diagnosis not present

## 2023-07-13 ENCOUNTER — Inpatient Hospital Stay

## 2023-07-13 ENCOUNTER — Inpatient Hospital Stay: Attending: Nurse Practitioner

## 2023-07-13 DIAGNOSIS — E538 Deficiency of other specified B group vitamins: Secondary | ICD-10-CM | POA: Diagnosis not present

## 2023-07-13 DIAGNOSIS — D508 Other iron deficiency anemias: Secondary | ICD-10-CM

## 2023-07-13 MED ORDER — CYANOCOBALAMIN 1000 MCG/ML IJ SOLN
1000.0000 ug | INTRAMUSCULAR | Status: DC
  Administered 2023-07-13: 1000 ug via INTRAMUSCULAR
  Filled 2023-07-13: qty 1

## 2023-07-26 ENCOUNTER — Ambulatory Visit (INDEPENDENT_AMBULATORY_CARE_PROVIDER_SITE_OTHER): Admitting: Nurse Practitioner

## 2023-07-26 ENCOUNTER — Encounter: Payer: Self-pay | Admitting: Nurse Practitioner

## 2023-07-26 VITALS — BP 90/62 | HR 73 | Ht <= 58 in | Wt 199.2 lb

## 2023-07-26 DIAGNOSIS — K219 Gastro-esophageal reflux disease without esophagitis: Secondary | ICD-10-CM | POA: Diagnosis not present

## 2023-07-26 DIAGNOSIS — Z86718 Personal history of other venous thrombosis and embolism: Secondary | ICD-10-CM

## 2023-07-26 DIAGNOSIS — R1310 Dysphagia, unspecified: Secondary | ICD-10-CM

## 2023-07-26 DIAGNOSIS — Z7901 Long term (current) use of anticoagulants: Secondary | ICD-10-CM

## 2023-07-26 DIAGNOSIS — D509 Iron deficiency anemia, unspecified: Secondary | ICD-10-CM

## 2023-07-26 DIAGNOSIS — Z862 Personal history of diseases of the blood and blood-forming organs and certain disorders involving the immune mechanism: Secondary | ICD-10-CM

## 2023-07-26 MED ORDER — NA SULFATE-K SULFATE-MG SULF 17.5-3.13-1.6 GM/177ML PO SOLN: 1.0000 | ORAL | 0 refills | Status: DC

## 2023-07-26 MED ORDER — PANTOPRAZOLE SODIUM 40 MG PO TBEC: 40.0000 mg | DELAYED_RELEASE_TABLET | Freq: Two times a day (BID) | ORAL | 2 refills | Status: DC

## 2023-07-26 NOTE — Patient Instructions (Addendum)
 We have sent the following medications to your pharmacy for you to pick up at your convenience: Suprep, Pantoprazole      Increase your Pantoprazole  to 1 pill twice daily.   Avoid eating large pieces of bread or meats.   You have been scheduled for an endoscopy and colonoscopy. Please follow the written instructions given to you at your visit today.  If you use inhalers (even only as needed), please bring them with you on the day of your procedure.  DO NOT TAKE 7 DAYS PRIOR TO TEST- Trulicity (dulaglutide) Ozempic, Wegovy (semaglutide) Mounjaro (tirzepatide) Bydureon Bcise (exanatide extended release)  DO NOT TAKE 1 DAY PRIOR TO YOUR TEST Rybelsus (semaglutide) Adlyxin (lixisenatide) Victoza (liraglutide) Byetta (exanatide) ___________________________________________________________________________  _______________________________________________________  If your blood pressure at your visit was 140/90 or greater, please contact your primary care physician to follow up on this.  _______________________________________________________  If you are age 78 or older, your body mass index should be between 23-30. Your Body mass index is 42.37 kg/m. If this is out of the aforementioned range listed, please consider follow up with your Primary Care Provider.  If you are age 40 or younger, your body mass index should be between 19-25. Your Body mass index is 42.37 kg/m. If this is out of the aformentioned range listed, please consider follow up with your Primary Care Provider.   ________________________________________________________  The [facility] providers would like to encourage you to use MYCHART to communicate with providers for non-urgent requests or questions.  Due to long hold times on the telephone, sending your provider a message by [facility] may be a faster and more efficient way to get a response.  Please allow 48 business hours for a response.  Please remember that this is  for non-urgent requests.  _______________________________________________________  Due to recent changes in healthcare laws, you may see the results of your imaging and laboratory studies on MyChart before your provider has had a chance to review them.  We understand that in some cases there may be results that are confusing or concerning to you. Not all laboratory results come back in the same time frame and the provider may be waiting for multiple results in order to interpret others.  Please give us  48 hours in order for your provider to thoroughly review all the results before contacting the office for clarification of your results.   Thank you for choosing me and [facility].

## 2023-07-26 NOTE — Progress Notes (Signed)
 Agree with assessment and plan as outlined.

## 2023-07-26 NOTE — Progress Notes (Signed)
 07/26/2023 Melissa Gaines [id number] 1970/12/22   CHIEF COMPLAINT: GERD, left-sided abdominal pain  HISTORY OF PRESENT ILLNESS: Melissa Gaines is a 53 year old female with a past medical history of anxiety, asthma, osteoporosis, systemic lupus erythematosus, PE 2010 on Eliquis , chronic pain, IDA and GERD. S/P gastric bypass surgery 2014 performed in [place] . She presents to our office today as referred by Dr. Orelia Binet further evaluation regarding GERD. She endorses having daily heartburn which sometimes is severe at nighttime and often feels like there is a ball in her esophagus. She sometimes has difficulty swallowing bread or meat meat which gets stuck to the upper esophagus. She drinks water and the stuck food passes down or sometimes comes back out.  She takes Pantoprazole  40 mg once daily and Famotidine  20 mg twice daily. No NSAID use. She is passing normal brown bowel movements daily. No bloody or black stools. She was previously followed by gastroenterologist Dr. Loyal Ruffing at [facility].  She underwent an EGD and colonoscopy 04/23/2021. 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 The peptic stricture was dilated. She stated her dysphagia symptoms did not significantly improved following esophageal dilatation. She underwent a colonoscopy at the same time which was aborted mid procedure secondary to acute hypoxemia, the cecal base was reached, however, there was potential concern of missing polyps with rapid withdrawal of the scope.  She was advised to repeat a colonoscopy in 1 year which was not done.  She has a history of asthma and denied having any asthma symptoms/flare prior to her EGD/colonoscopy date.  PAST GI PROCEDURES:   EGD 04/23/2021: Esophageal body is tortuous.  Distal esophagus reveals peptic stricture  and esophagitis (LA grade B).  Random biopsies are  obtained from distal  and proximal esophagus for EoE.  8 cm hiatal hernia. Hill flap valve grade I.  Gastric mucosa is otherwise  normal.  The duodenal bulb and 2nd part of the duodenum appeared normal.  Peptic stricture was dilated with TTS balloon from 15 mm to 18 mm.   Impression  - Peptic stricture and erosive esophagitis  - S/p dilation   Colonoscopy 04/23/2021: The exam was cut short due to hypoxemia.  Cecal base was reached.  Large  mass lesions or large polyps are not observed.  However, evaluation of the  colonic mucosa for smaller lesions on withdraw was cursory.   Impression  - No worrisome findings  - Exam is limited due to hypoxemia   Recommendation  Repeat exam in one year at [facility]       Latest Ref Rng & Units 05/18/2023   10:16 AM 02/16/2023    9:25 AM 11/17/2022    9:42 AM  CBC  WBC 4.0 - 10.5 K/uL 5.5  7.3  6.4   Hemoglobin 12.0 - 15.0 g/dL 44.0  10.2  72.5   Hematocrit 36.0 - 46.0 % 44.0  44.3  42.0   Platelets 150 - 400 K/uL 309  284  307        Latest Ref Rng & Units 05/18/2023   10:16 AM 02/16/2023    9:25 AM 11/17/2022    9:42 AM  CMP  Glucose 70 - 99 mg/dL 88  99  90   BUN 6 - 20 mg/dL 8  7  11    Creatinine 0.44 - 1.00 mg/dL 3.66  4.40  3.47   Sodium 135 -  145 mmol/L 138  139  140   Potassium 3.5 - 5.1 mmol/L 4.0  3.9  4.1   Chloride 98 - 111 mmol/L 105  106  102   CO2 22 - 32 mmol/L 25  23  31    Calcium  8.9 - 10.3 mg/dL 9.1  9.4  9.5   Total Protein 6.5 - 8.1 g/dL 7.8  7.9  7.8   Total Bilirubin 0.0 - 1.2 mg/dL 0.4  0.4  0.4   Alkaline Phos 38 - 126 U/L 76  70  75   AST 15 - 41 U/L 16  12  10    ALT 0 - 44 U/L 15  14  9       Past Medical History:  Diagnosis Date   Anemia    Anxiety    Asthma    Chronic pain    Congenital hip dislocation    GERD (gastroesophageal reflux disease)    Iron deficiency anemia 04/14/2018   Kidney mass 2017   Migraine    Osteoporosis    Pathological dislocation of shoulder joint, bilateral    congential    PTSD (post-traumatic stress disorder)    Sleep apnea    Systemic lupus erythematosus (HCC)    Urticaria    Past Surgical History:  Procedure Laterality Date   CESAREAN SECTION  2005   CESAREAN SECTION W/BTL  2010   HIP SURGERY     in childhood for congenital hip dislocation   LAPAROSCOPIC GASTRIC SLEEVE RESECTION  2014   STAPEDECTOMY  11/01/2011   L postauricular stapedectomy with CO2 laser and insertion of 6 x 4.75 mm SMart Piston   TOTAL HIP ARTHROPLASTY     05/2000 R, 11/2000 L   Social History: She is on disability.  She has 1 son and 1 daughter.  She reports that she has never smoked. She has been exposed to tobacco smoke. She has never used smokeless tobacco. She reports that she does not currently use alcohol. She reports that she does not use drugs.  Family History: family history includes Anxiety disorder in her mother and sister; Asthma in her mother and sister; Cancer in her maternal grandmother, paternal grandmother, and another family member; Depression in her mother and sister; Heart Problems in her mother; Hernia in her father; Kidney Stones in her father and mother; Kidney disease in her father; Liver cancer in her mother; Osteoporosis in her mother; Stroke in her mother. Paternal grandmother had stomach cancer.  No known family history of esophageal or colorectal cancer.  Allergies  Allergen Reactions   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 Throat closes and short of breath.      Outpatient Encounter Medications as of 07/26/2023  Medication Sig   acetaminophen  (TYLENOL ) 500 MG tablet Take 500 mg by mouth every 6 (six) hours as needed for headache. (Patient not taking: Reported on 06/16/2023)   acetaminophen -codeine (TYLENOL  #3) 300-30 MG tablet Take 1-2 tablets by mouth every 4 (four) hours as needed for moderate pain.   albuterol  (VENTOLIN  HFA) 108 (90 Base) MCG/ACT inhaler Inhale 2 puffs into the lungs every 6 (six) hours as needed for wheezing or shortness  of breath.   atorvastatin  (LIPITOR) 80 MG tablet Take 1 tablet (80 mg total) by mouth daily.   azelastine  (ASTELIN ) 0.1 % nasal spray Place 2 sprays into both nostrils 2 (two) times daily. Use in each nostril as directed   budesonide -formoterol  (SYMBICORT ) 160-4.5 MCG/ACT inhaler Inhale 2 puffs into the lungs daily.  cetirizine  (ZYRTEC ) 10 MG tablet Take 1 tablet (10 mg total) by mouth daily.   cromolyn  (OPTICROM ) 4 % ophthalmic solution Place 1 drop into both eyes 4 (four) times daily.   cyanocobalamin  (,VITAMIN B-12,) 1000 MCG/ML injection Inject 1 mL (1,000 mcg total) into the skin every 30 (thirty) days.   cycloSPORINE  (RESTASIS ) 0.05 % ophthalmic emulsion Place 1 drop into both eyes 2 (two) times daily.   diclofenac  Sodium (VOLTAREN  ARTHRITIS PAIN) 1 % GEL Apply 4 g topically 4 (four) times daily. (Patient not taking: Reported on 06/16/2023)   ELIQUIS  5 MG TABS tablet TAKE 1 TABLET BY MOUTH TWICE  DAILY NEEDS PCP APPOINTMENT  BEFORE MORE FILLS   EPINEPHrine  0.3 mg/0.3 mL IJ SOAJ injection INJECT INTRAMUSCULARLY 1 PEN AS  NEEDED FOR ALLERGIC RESPONSE AS  DIRECTED BY MD. Salina Craver MEDICAL  HELP AFTER USE.   Erenumab -aooe (AIMOVIG ) 140 MG/ML SOAJ Inject 140 mg into the skin every 30 (thirty) days.   famotidine  (PEPCID ) 20 MG tablet TAKE 1 TABLET BY MOUTH TWICE  DAILY   ferrous sulfate  325 (65 FE) MG tablet Take 1 tablet (325 mg total) by mouth 3 (three) times daily with meals.   FLUoxetine  (PROZAC ) 20 MG capsule Take 1 capsule (20 mg total) by mouth daily.   fluticasone  (FLONASE ) 50 MCG/ACT nasal spray Place 2 sprays into both nostrils daily.   gabapentin  (NEURONTIN ) 400 MG capsule Take 1 capsule (400 mg total) by mouth at bedtime.   levocetirizine (XYZAL ) 5 MG tablet TAKE 1 TABLET BY MOUTH IN THE  EVENING   pantoprazole  (PROTONIX ) 40 MG tablet TAKE 1 TABLET BY MOUTH DAILY   SYRINGE/NEEDLE, DISP, 1 ML 25G X 5/8" 1 ML MISC Match with B12   traZODone  (DESYREL ) 100 MG tablet Take 1 tablet (100 mg  total) by mouth at bedtime.   Ubrogepant  (UBRELVY ) 100 MG TABS Take 1 tablet (100 mg total) by mouth as needed (May repeat in 2 hours.  Maximum 2 tablets in 24 hours).   Vitamin D , Ergocalciferol , (DRISDOL ) 50000 units CAPS capsule Take 1 capsule (50,000 Units total) by mouth every 7 (seven) days.   No facility-administered encounter medications on file as of 07/26/2023.   REVIEW OF SYSTEMS:  Gen: Denies fever, sweats or chills. No weight loss.  CV: Denies chest pain, palpitations or edema. Resp: Denies cough, shortness of breath of hemoptysis.  GI: Denies heartburn, dysphagia, stomach or lower abdominal pain. No diarrhea or constipation.  GU: Denies urinary burning, blood in urine, increased urinary frequency or incontinence. MS: + Hip and knee pain. Derm: Denies rash, itchiness, skin lesions or unhealing ulcers. Psych: Denies depression, anxiety, memory loss or confusion. Heme: Denies bruising, easy bleeding. Neuro:  Denies headaches, dizziness or paresthesias. Endo:  Denies any problems with DM, thyroid  or adrenal function.  PHYSICAL EXAM: BP 90/62 (BP Location: Left Arm, Patient Position: Sitting, Cuff Size: Large)   Pulse 73   Ht 4' 9.5" (1.461 m) Comment: height measured without shoes  Wt 199 lb 4 oz (90.4 kg)   BMI 42.37 kg/m   General: 53 year old female in no acute distress. Head: Normocephalic and atraumatic. Eyes:  Sclerae non-icteric, conjunctive pink. Ears: Normal auditory acuity. Mouth: Dentition intact. No ulcers or lesions.  Neck: Supple, no lymphadenopathy or thyromegaly.  Lungs: Clear bilaterally to auscultation without wheezes, crackles or rhonchi. Heart: Regular rate and rhythm. No murmur, rub or gallop appreciated.  Abdomen: Soft, nontender, nondistended. No masses. No hepatosplenomegaly. Normoactive bowel sounds x 4 quadrants.  Rectal: Deferred. Musculoskeletal:  Symmetrical with no gross deformities. Skin: Warm and dry. No rash or lesions on visible  extremities. Extremities: No edema. Neurological: Alert oriented x 4, no focal deficits.  Psychological: Alert and cooperative. Normal mood and affect.  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 The peptic stricture was dilated. She stated her dysphagia symptoms did not significantly improved following esophageal dilatation.  On Pantoprazole  daily and Famotidine  twice daily. -EGD at time of colonoscopy at [facility] (patient became hypoxic during colonoscopy 04/2021, see HPI), benefits and risks discussed including risk with sedation, risk of bleeding, perforation and infection  - Barium swallow study if EGD unrevealing - GERD diet - Increase Pantoprazole  40 mg twice daily, continue famotidine  20 mg twice daily  Colon cancer screening.  Colonoscopy 04/23/2021 was incomplete, the cecal base was reached but the scope was rapidly removed secondary to acute hypoxemia.  A repeat colonoscopy in 1 year was recommended but was not done. -Colonoscopy at [facility] benefits and risks discussed including risk with sedation, risk of bleeding, perforation and infection   History of PE on Eliquis  - Our office will contact the patient's primary care physician to verify Eliquis  hold instructions prior to proceeding with an EGD and colonoscopy  History of IDA, likely secondary to gastric sleeve resection 2014.  On oral iron daily. - EGD and colonoscopy as ordered above - Patient instructed to hold ferrous sulfate  1 week prior to her EGD and colonoscopy date, restart after procedures completed - CBC and iron panel          CC:  Genora Kidd, MD

## 2023-07-27 ENCOUNTER — Ambulatory Visit (INDEPENDENT_AMBULATORY_CARE_PROVIDER_SITE_OTHER)

## 2023-07-27 DIAGNOSIS — J309 Allergic rhinitis, unspecified: Secondary | ICD-10-CM

## 2023-07-29 ENCOUNTER — Encounter: Payer: Self-pay | Admitting: Student

## 2023-08-05 ENCOUNTER — Ambulatory Visit (INDEPENDENT_AMBULATORY_CARE_PROVIDER_SITE_OTHER): Payer: Self-pay

## 2023-08-05 DIAGNOSIS — J309 Allergic rhinitis, unspecified: Secondary | ICD-10-CM

## 2023-08-10 ENCOUNTER — Inpatient Hospital Stay: Attending: Nurse Practitioner

## 2023-08-10 DIAGNOSIS — E538 Deficiency of other specified B group vitamins: Secondary | ICD-10-CM | POA: Diagnosis not present

## 2023-08-10 DIAGNOSIS — D508 Other iron deficiency anemias: Secondary | ICD-10-CM

## 2023-08-10 MED ORDER — CYANOCOBALAMIN 1000 MCG/ML IJ SOLN
1000.0000 ug | INTRAMUSCULAR | Status: DC
  Administered 2023-08-10: 1000 ug via INTRAMUSCULAR
  Filled 2023-08-10: qty 1

## 2023-08-15 DIAGNOSIS — M2550 Pain in unspecified joint: Secondary | ICD-10-CM | POA: Diagnosis not present

## 2023-08-15 DIAGNOSIS — D6861 Antiphospholipid syndrome: Secondary | ICD-10-CM | POA: Diagnosis not present

## 2023-08-15 DIAGNOSIS — Z79899 Other long term (current) drug therapy: Secondary | ICD-10-CM | POA: Diagnosis not present

## 2023-08-15 DIAGNOSIS — M329 Systemic lupus erythematosus, unspecified: Secondary | ICD-10-CM | POA: Diagnosis not present

## 2023-08-16 ENCOUNTER — Encounter: Payer: Self-pay | Admitting: *Deleted

## 2023-08-17 ENCOUNTER — Other Ambulatory Visit (HOSPITAL_COMMUNITY): Payer: Self-pay | Admitting: Psychiatry

## 2023-08-17 DIAGNOSIS — F331 Major depressive disorder, recurrent, moderate: Secondary | ICD-10-CM

## 2023-08-17 DIAGNOSIS — F4310 Post-traumatic stress disorder, unspecified: Secondary | ICD-10-CM

## 2023-08-27 DIAGNOSIS — M170 Bilateral primary osteoarthritis of knee: Secondary | ICD-10-CM | POA: Diagnosis not present

## 2023-08-27 DIAGNOSIS — S8002XA Contusion of left knee, initial encounter: Secondary | ICD-10-CM | POA: Diagnosis not present

## 2023-08-31 ENCOUNTER — Ambulatory Visit: Admitting: Student

## 2023-09-02 ENCOUNTER — Ambulatory Visit (INDEPENDENT_AMBULATORY_CARE_PROVIDER_SITE_OTHER)

## 2023-09-02 DIAGNOSIS — J309 Allergic rhinitis, unspecified: Secondary | ICD-10-CM

## 2023-09-06 ENCOUNTER — Telehealth: Payer: Self-pay | Admitting: Gastroenterology

## 2023-09-06 NOTE — Telephone Encounter (Signed)
 Procedure:Colonoscopy/Endoscopy Procedure date: 09/15/23 Procedure location: [facility] Arrival Time: 6:00 am Spoke with the patient Y/N: Yes Any prep concerns? No  Has the patient obtained the prep from the pharmacy ? Yes Do you have a care partner and transportation: Yes Any additional concerns? No

## 2023-09-07 ENCOUNTER — Telehealth: Payer: Self-pay

## 2023-09-07 ENCOUNTER — Other Ambulatory Visit: Payer: Self-pay

## 2023-09-07 ENCOUNTER — Inpatient Hospital Stay: Attending: Nurse Practitioner

## 2023-09-07 ENCOUNTER — Encounter (HOSPITAL_COMMUNITY): Payer: Self-pay | Admitting: Gastroenterology

## 2023-09-07 DIAGNOSIS — E538 Deficiency of other specified B group vitamins: Secondary | ICD-10-CM | POA: Insufficient documentation

## 2023-09-07 DIAGNOSIS — D508 Other iron deficiency anemias: Secondary | ICD-10-CM

## 2023-09-07 MED ORDER — CYANOCOBALAMIN 1000 MCG/ML IJ SOLN
1000.0000 ug | INTRAMUSCULAR | Status: DC
  Administered 2023-09-07: 1000 ug via INTRAMUSCULAR
  Filled 2023-09-07 (×2): qty 1

## 2023-09-07 NOTE — Progress Notes (Addendum)
 PCP - [facility] [address]  Cardiologist - Saw Shelda Bruckner, MD 2022   PPM/ICD -  Device Orders -  Rep Notified -   Chest x-ray -  EKG - 2022 Stress Test -  ECHO -  Cardiac Cath -  CT cor. 2022 CMP/CBC/diff-08-15-23  Sleep Study - OSA  CPAP - no  Fasting Blood Sugar - n/a Checks Blood Sugar _____ times a day  Blood Thinner Instructions:Eliquis   hold 2 days prior managed by PCP Aspirin Instructions:n/a  Pt. Reported she has bowel prep instructions  Activity--Able to climb a flight of stairs with no CP some SOB from asthma not new for pt.  Anesthesia review: SLE, Asthma, Hx.OSA no cpap now, PTSD, PE 2010 Eliquis ,   Patient denies shortness of breath, fever, cough and chest pain at PAT appointment   All instructions explained to the patient, with a verbal understanding of the material. Patient agrees to go over the instructions while at home for a better understanding. Patient also instructed to self quarantine after being tested for COVID-19. The opportunity to ask questions was provided.

## 2023-09-07 NOTE — Telephone Encounter (Signed)
 Recd fax from Dr. Suzan for patient to hold Eliquis  2 days prior to procedure and resume her normal dosing of Eliquis  the day after procedure. Clearance has been sent to be scanned in. Patient has been informed and voiced understanding.

## 2023-09-12 ENCOUNTER — Other Ambulatory Visit: Payer: Self-pay | Admitting: Medical Genetics

## 2023-09-13 ENCOUNTER — Ambulatory Visit: Admitting: Family Medicine

## 2023-09-13 VITALS — BP 110/69 | HR 77 | Ht <= 58 in | Wt 188.8 lb

## 2023-09-13 DIAGNOSIS — D6861 Antiphospholipid syndrome: Secondary | ICD-10-CM | POA: Diagnosis not present

## 2023-09-13 DIAGNOSIS — R2689 Other abnormalities of gait and mobility: Secondary | ICD-10-CM | POA: Diagnosis not present

## 2023-09-13 DIAGNOSIS — G8929 Other chronic pain: Secondary | ICD-10-CM

## 2023-09-13 DIAGNOSIS — M545 Low back pain, unspecified: Secondary | ICD-10-CM

## 2023-09-13 DIAGNOSIS — E785 Hyperlipidemia, unspecified: Secondary | ICD-10-CM

## 2023-09-13 DIAGNOSIS — M25561 Pain in right knee: Secondary | ICD-10-CM

## 2023-09-13 NOTE — Assessment & Plan Note (Signed)
 Patient is new to me as of today.  Unclear if this discussion has been had previously, however, patient should ideally be on warfarin rather than Eliquis .  Will discuss in depth at follow-up in 3 months.  Recommended she also discuss with her hematologist.

## 2023-09-13 NOTE — Assessment & Plan Note (Signed)
 LDL December 2024 120.  Continue atorvastatin  80 mg daily. - Lipid panel today - Persistently elevated consider addition of Zetia 

## 2023-09-13 NOTE — Progress Notes (Signed)
    SUBJECTIVE:   CHIEF COMPLAINT / HPI: Needs walker and tub bench  Hx of left hip pain, right knee  Used to have walker but lost in travel. Also needs tub bench. Long hx of right knee osteoarthritis, lumbar back pain, and left hip pain. Has had knee lock down. Sees sports medicine. Would like letter that states she should not live on upper floors without elevator access.  Does not recollect that she has ever been on warfarin.  Did not recognize medication. States she has had a clot before in the past.   PERTINENT  PMH / PSH: Migraine, Asthma, Antiphospholipid antibody syndrome, SLE  OBJECTIVE:   BP 110/69   Pulse 77   Ht 4' 9.5 (1.461 m)   Wt 188 lb 12.8 oz (85.6 kg)   LMP  (LMP Unknown)   SpO2 96%   BMI 40.15 kg/m   General: NAD, well-appearing Neuro: A&O, antalgic gait Cardiovascular: RRR, no murmurs, no peripheral edema Respiratory: normal WOB on RA, CTAB, no wheezes, ronchi or rales Extremities: Moving all 4 extremities equally   ASSESSMENT/PLAN:   Assessment & Plan Functional gait abnormality Lumbar back pain Chronic pain of right knee History of chronic degenerative joint disease of multiple joints resulting in functional gait abnormalities. - Physical therapy placed - DME order for rolling walker and tub bench - Letter provided for not loading on upper force Hyperlipidemia, unspecified hyperlipidemia type LDL December 2024 120.  Continue atorvastatin  80 mg daily. - Lipid panel today - Persistently elevated consider addition of Zetia  Antiphospholipid antibody syndrome [facility]) Patient is new to me as of today.  Unclear if this discussion has been had previously, however, patient should ideally be on warfarin rather than Eliquis .  Will discuss in depth at follow-up in 3 months.  Recommended she also discuss with her hematologist.  Return in about 3 months (around 12/14/2023) for Blood thinner discussion.  Ozell Provencal, MD [facility]

## 2023-09-13 NOTE — Assessment & Plan Note (Signed)
 History of chronic degenerative joint disease of multiple joints resulting in functional gait abnormalities. - Physical therapy placed - DME order for rolling walker and tub bench - Letter provided for not loading on upper force

## 2023-09-13 NOTE — Patient Instructions (Signed)
 It was great to see you! Thank you for allowing me to participate in your care!  Our plans for today:  - You should be called to schedule your physical therapy appointment. - You should get a call regarding your walker and tub bench. - I will let you know the results of your cholesterol testing today.   Please arrive 15 minutes PRIOR to your next scheduled appointment time! If you do not, this affects OTHER patients' care.  Take care and seek immediate care sooner if you develop any concerns.   Ozell Provencal, MD, PGY-3 [facility] 1:58 PM 09/13/2023  [facility]

## 2023-09-14 ENCOUNTER — Encounter (HOSPITAL_COMMUNITY): Payer: Self-pay | Admitting: Gastroenterology

## 2023-09-14 LAB — LIPID PANEL
Chol/HDL Ratio: 5.1 ratio — ABNORMAL HIGH (ref 0.0–4.4)
Cholesterol, Total: 215 mg/dL — ABNORMAL HIGH (ref 100–199)
HDL: 42 mg/dL (ref >39)
LDL Chol Calc (NIH): 154 mg/dL — ABNORMAL HIGH (ref 0–99)
Triglycerides: 105 mg/dL (ref 0–149)
VLDL Cholesterol Cal: 19 mg/dL (ref 5–40)

## 2023-09-14 NOTE — Anesthesia Preprocedure Evaluation (Addendum)
 Anesthesia Evaluation  Patient identified by MRN, date of birth, ID band Patient awake    Reviewed: Allergy  & Precautions, NPO status , Patient's Chart, lab work & pertinent test results  History of Anesthesia Complications (+) history of anesthetic complications  Airway Mallampati: III  TM Distance: >3 FB     Dental no notable dental hx. (+) Dental Advisory Given, Caps   Pulmonary asthma , sleep apnea , pneumonia, resolved   breath sounds clear to auscultation + decreased breath sounds      Cardiovascular + DVT  Normal cardiovascular exam Rhythm:Regular Rate:Normal     Neuro/Psych  Headaches PSYCHIATRIC DISORDERS Anxiety Depression    PTSD Neuromuscular disease    GI/Hepatic Neg liver ROS,GERD  Medicated,,  Endo/Other    Class 3 obesityHLD  Renal/GU negative Renal ROS  negative genitourinary   Musculoskeletal  (+) Arthritis , Osteoarthritis,  Hx/o congenital hip dislocation Osteoporosis   Abdominal  (+) + obese  Peds  Hematology  (+) Blood dyscrasia, anemia , REFUSES BLOOD PRODUCTSSLE Eliquis  therapy- last dose 2 days ago   Anesthesia Other Findings   Reproductive/Obstetrics                              Anesthesia Physical Anesthesia Plan  ASA: 3  Anesthesia Plan: MAC   Post-op Pain Management: Minimal or no pain anticipated   Induction: Intravenous  PONV Risk Score and Plan: 2 and Treatment may vary due to age or medical condition and Propofol  infusion  Airway Management Planned: Natural Airway, Simple Face Mask and Nasal Cannula  Additional Equipment: None  Intra-op Plan:   Post-operative Plan:   Informed Consent: I have reviewed the patients History and Physical, chart, labs and discussed the procedure including the risks, benefits and alternatives for the proposed anesthesia with the patient or authorized representative who has indicated his/her understanding and  acceptance.     Dental advisory given  Plan Discussed with: CRNA and Anesthesiologist  Anesthesia Plan Comments:          Anesthesia Quick Evaluation

## 2023-09-15 ENCOUNTER — Encounter (HOSPITAL_COMMUNITY): Admitting: Certified Registered Nurse Anesthetist

## 2023-09-15 ENCOUNTER — Encounter (HOSPITAL_COMMUNITY): Payer: Self-pay | Admitting: Gastroenterology

## 2023-09-15 ENCOUNTER — Ambulatory Visit (HOSPITAL_COMMUNITY)
Admission: RE | Admit: 2023-09-15 | Discharge: 2023-09-15 | Disposition: A | Discharge status: Home / Self Care | Attending: Gastroenterology | Admitting: Gastroenterology

## 2023-09-15 ENCOUNTER — Encounter (HOSPITAL_COMMUNITY)
Admission: RE | Disposition: A | Discharge status: Home / Self Care | Payer: Self-pay | Source: Home / Self Care | Attending: Gastroenterology

## 2023-09-15 ENCOUNTER — Ambulatory Visit: Payer: Self-pay | Admitting: Family Medicine

## 2023-09-15 ENCOUNTER — Other Ambulatory Visit: Payer: Self-pay

## 2023-09-15 DIAGNOSIS — D123 Benign neoplasm of transverse colon: Secondary | ICD-10-CM

## 2023-09-15 DIAGNOSIS — G709 Myoneural disorder, unspecified: Secondary | ICD-10-CM | POA: Insufficient documentation

## 2023-09-15 DIAGNOSIS — M810 Age-related osteoporosis without current pathological fracture: Secondary | ICD-10-CM | POA: Insufficient documentation

## 2023-09-15 DIAGNOSIS — Z6841 Body Mass Index (BMI) 40.0 and over, adult: Secondary | ICD-10-CM

## 2023-09-15 DIAGNOSIS — J45909 Unspecified asthma, uncomplicated: Secondary | ICD-10-CM | POA: Diagnosis not present

## 2023-09-15 DIAGNOSIS — Z7901 Long term (current) use of anticoagulants: Secondary | ICD-10-CM | POA: Insufficient documentation

## 2023-09-15 DIAGNOSIS — K56609 Unspecified intestinal obstruction, unspecified as to partial versus complete obstruction: Secondary | ICD-10-CM

## 2023-09-15 DIAGNOSIS — Z86718 Personal history of other venous thrombosis and embolism: Secondary | ICD-10-CM | POA: Insufficient documentation

## 2023-09-15 DIAGNOSIS — K449 Diaphragmatic hernia without obstruction or gangrene: Secondary | ICD-10-CM | POA: Diagnosis not present

## 2023-09-15 DIAGNOSIS — F418 Other specified anxiety disorders: Secondary | ICD-10-CM | POA: Diagnosis not present

## 2023-09-15 DIAGNOSIS — Q399 Congenital malformation of esophagus, unspecified: Secondary | ICD-10-CM | POA: Diagnosis not present

## 2023-09-15 DIAGNOSIS — Z79899 Other long term (current) drug therapy: Secondary | ICD-10-CM | POA: Diagnosis not present

## 2023-09-15 DIAGNOSIS — R1310 Dysphagia, unspecified: Secondary | ICD-10-CM | POA: Diagnosis present

## 2023-09-15 DIAGNOSIS — K222 Esophageal obstruction: Secondary | ICD-10-CM

## 2023-09-15 DIAGNOSIS — M329 Systemic lupus erythematosus, unspecified: Secondary | ICD-10-CM | POA: Diagnosis not present

## 2023-09-15 DIAGNOSIS — K219 Gastro-esophageal reflux disease without esophagitis: Secondary | ICD-10-CM | POA: Insufficient documentation

## 2023-09-15 DIAGNOSIS — E66813 Obesity, class 3: Secondary | ICD-10-CM | POA: Insufficient documentation

## 2023-09-15 DIAGNOSIS — Z1211 Encounter for screening for malignant neoplasm of colon: Secondary | ICD-10-CM

## 2023-09-15 DIAGNOSIS — Z5986 Financial insecurity: Secondary | ICD-10-CM | POA: Diagnosis not present

## 2023-09-15 DIAGNOSIS — F4310 Post-traumatic stress disorder, unspecified: Secondary | ICD-10-CM | POA: Insufficient documentation

## 2023-09-15 DIAGNOSIS — M1990 Unspecified osteoarthritis, unspecified site: Secondary | ICD-10-CM | POA: Insufficient documentation

## 2023-09-15 DIAGNOSIS — D122 Benign neoplasm of ascending colon: Secondary | ICD-10-CM | POA: Diagnosis not present

## 2023-09-15 DIAGNOSIS — D126 Benign neoplasm of colon, unspecified: Secondary | ICD-10-CM

## 2023-09-15 DIAGNOSIS — D509 Iron deficiency anemia, unspecified: Secondary | ICD-10-CM | POA: Insufficient documentation

## 2023-09-15 DIAGNOSIS — G4730 Sleep apnea, unspecified: Secondary | ICD-10-CM | POA: Diagnosis not present

## 2023-09-15 DIAGNOSIS — K5730 Diverticulosis of large intestine without perforation or abscess without bleeding: Secondary | ICD-10-CM

## 2023-09-15 DIAGNOSIS — K56699 Other intestinal obstruction unspecified as to partial versus complete obstruction: Secondary | ICD-10-CM | POA: Insufficient documentation

## 2023-09-15 DIAGNOSIS — Z9884 Bariatric surgery status: Secondary | ICD-10-CM | POA: Insufficient documentation

## 2023-09-15 HISTORY — PX: ESOPHAGOGASTRODUODENOSCOPY: SHX5428

## 2023-09-15 HISTORY — DX: Malignant (primary) neoplasm, unspecified: C80.1

## 2023-09-15 HISTORY — PX: COLONOSCOPY: SHX5424

## 2023-09-15 HISTORY — DX: Respiratory tuberculosis unspecified: A15.9

## 2023-09-15 HISTORY — PX: POLYPECTOMY: SHX149

## 2023-09-15 HISTORY — DX: Other complications of anesthesia, initial encounter: T88.59XA

## 2023-09-15 HISTORY — PX: SAVORY DILATION: SHX5439

## 2023-09-15 HISTORY — DX: Depression, unspecified: F32.A

## 2023-09-15 HISTORY — DX: Pneumonia, unspecified organism: J18.9

## 2023-09-15 HISTORY — DX: Unspecified osteoarthritis, unspecified site: M19.90

## 2023-09-15 SURGERY — COLONOSCOPY
Anesthesia: Monitor Anesthesia Care

## 2023-09-15 MED ORDER — ONDANSETRON HCL 4 MG/2ML IJ SOLN
INTRAMUSCULAR | Status: DC | PRN
  Administered 2023-09-15: 4 mg via INTRAVENOUS

## 2023-09-15 MED ORDER — PROPOFOL 1000 MG/100ML IV EMUL
INTRAVENOUS | Status: AC
  Filled 2023-09-15: qty 100

## 2023-09-15 MED ORDER — SODIUM CHLORIDE 0.9 % IV SOLN: INTRAVENOUS | Status: DC

## 2023-09-15 MED ORDER — EZETIMIBE 10 MG PO TABS: 10.0000 mg | ORAL_TABLET | Freq: Every day | ORAL | 0 refills | Status: DC

## 2023-09-15 MED ORDER — PROPOFOL 10 MG/ML IV BOLUS
INTRAVENOUS | Status: DC | PRN
Start: 2023-09-15 — End: 2023-09-15
  Administered 2023-09-15: 30 mg via INTRAVENOUS
  Administered 2023-09-15: 20 mg via INTRAVENOUS
  Administered 2023-09-15: 150 ug/kg/min via INTRAVENOUS

## 2023-09-15 MED ORDER — LIDOCAINE 2% (20 MG/ML) 5 ML SYRINGE
INTRAMUSCULAR | Status: DC | PRN
  Administered 2023-09-15: 100 mg via INTRAVENOUS

## 2023-09-15 MED ORDER — PROPOFOL 500 MG/50ML IV EMUL
INTRAVENOUS | Status: AC
  Filled 2023-09-15: qty 50

## 2023-09-15 NOTE — Anesthesia Procedure Notes (Signed)
 Procedure Name: MAC Date/Time: 09/15/2023 7:33 AM  Performed by: Zulema Leita PARAS, CRNAPre-anesthesia Checklist: Patient identified, Emergency Drugs available, Suction available and Patient being monitored Oxygen Delivery Method: Simple face mask

## 2023-09-15 NOTE — Op Note (Signed)
 [facility] Patient Name: Melissa Gaines Procedure Date: 09/15/2023 MRN: [id number] Attending MD: Elspeth SQUIBB. Leigh , MD, [id number] Date of Birth: [birth year] CSN: [id number] Age: 53 Admit Type: Outpatient Procedure:                Colonoscopy Indications:              Screening for colorectal malignant neoplasm - last                            [facility]am [facility] - aborted due to hypoxia in 2023. Case                            done at the hospital for anesthesia support. Providers:                Elspeth SQUIBB. Leigh, MD, Gregoria Pierce, RN,                            Darleene Bare, RN, Corene Southgate, Technician Referring MD:              Medicines:                Monitored Anesthesia Care Complications:            No immediate complications. Estimated blood loss:                            Minimal. Estimated Blood Loss:     Estimated blood loss was minimal. Procedure:                Pre-Anesthesia Assessment:                           - Prior to the procedure, a History and Physical                            was performed, and patient medications and                            allergies were reviewed. The patient's tolerance of                            previous anesthesia was also reviewed. The risks                            and benefits of the procedure and the sedation                            options and risks were discussed with the patient.                            All questions were answered, and informed consent                            was obtained. Prior Anticoagulants: The patient has  taken Eliquis  (apixaban ), last dose was 2 days                            prior to procedure. ASA Grade Assessment: III - A                            patient with severe systemic disease. After                            reviewing the risks and benefits, the patient was                            deemed in satisfactory condition to  undergo the                            procedure.                           After obtaining informed consent, the colonoscope                            was passed under direct vision. Throughout the                            procedure, the patient's blood pressure, pulse, and                            oxygen saturations were monitored continuously. The                            [id number]-HQ190L ([id number] Olympus colonoscope was                            introduced through the anus and advanced to the the                            cecum, identified by appendiceal orifice and                            ileocecal valve. The colonoscopy was technically                            difficult and complex due to restricted mobility of                            the colon. The patient tolerated the procedure                            well. The quality of the bowel preparation was                            adequate. The ileocecal valve, appendiceal orifice,  and rectum were photographed. Scope In: 7:53:18 AM Scope Out: 8:29:09 AM Scope Withdrawal Time: 0 hours 16 minutes 52 seconds  Total Procedure Duration: 0 hours 35 minutes 51 seconds  Findings:      The perianal and digital rectal examinations were normal.      A 3 mm polyp was found in the ascending colon. The polyp was sessile.       The polyp was removed with a cold snare. Resection and retrieval were       complete.      Two sessile polyps were found in the transverse colon. The polyps were 3       to 4 mm in size. These polyps were removed with a cold snare. Resection       and retrieval were complete.      The sigmoid colon was significantly restricted with a benign instrinsic       stenosis, some small diverticuli in the area. I could not traverse this       with the regular adult colonoscope, had to remove it and use an       ultraslim pediatric colonoscope to traverse it, which prolonged the       exam.  Cecal intubation was challenging using the ultraslim scope due to       looping above the stricture.      A large amount of liquid stool was found in the entire colon, making       visualization difficult. Lavage of the colon was performed using copious       amounts of sterile water, resulting in clearance with adequate       visualization.      The exam was otherwise without abnormality. Impression:               - One 3 mm polyp in the ascending colon, removed                            with a cold snare. Resected and retrieved.                           - Two 3 to 4 mm polyps in the transverse colon,                            removed with a cold snare. Resected and retrieved.                           - Restricted sigmoid colon requiring use of                            ultraslim pediatric colonoscope.                           - Stool in the entire examined colon leading to                            extensive lavage.                           - The examination was otherwise normal. Moderate Sedation:      No moderate sedation, case  performed with MAC Recommendation:           - Patient has a contact number available for                            emergencies. The signs and symptoms of potential                            delayed complications were discussed with the                            patient. Return to normal activities tomorrow.                            Written discharge instructions were provided to the                            patient.                           - Resume previous diet.                           - Continue present medications.                           - Resume Eliquis  tomorrow.                           - Await pathology results. Procedure Code(s):        --- Professional ---                           [phone], Colonoscopy, flexible; with removal of                            tumor(s), polyp(s), or other lesion(s) by snare                             technique Diagnosis Code(s):        --- Professional ---                           Z12.11, Encounter for screening for malignant                            neoplasm of colon                           D12.2, Benign neoplasm of ascending colon                           D12.3, Benign neoplasm of transverse colon (hepatic                            flexure or splenic flexure)  K56.699, Other intestinal obstruction unspecified                            as to partial versus complete obstruction CPT copyright 2022 American Medical Association. All rights reserved. The codes documented in this report are preliminary and upon coder review may  be revised to meet current compliance requirements. Elspeth P. Leigh, MD 09/15/2023 8:58:08 AM This report has been signed electronically. Number of Addenda: 0

## 2023-09-15 NOTE — H&P (Signed)
 [facility] History and Physical   Primary Care Physician:  Alba Sharper, MD   Reason for Procedure:   Dysphagia / GERD, colon cancer screening - history of IDA (resolved)  Plan:    EGD and colonoscopy     HPI: Melissa Gaines is a 53 y.o. female  here for EGD and colonoscopy. Seen in the office in May. History of dysphagia and GERD - on protonix  and pepcid . Last EGD 2023 [facility] - large hiatal hernia with peptic stricture dilated to 18mm. She also had LA Grade B esophagitis. Colonoscopy done at that time, aborted due to hypoxia of unclear etiology. Not on supplemental oxygen, has asthma but states well controlled. Case done at the hospital for anesthesia support given history of hypoxia with anesthesia. Otherwise feels okay today without complaints.   I have discussed risks / benefits of anesthesia and endoscopic procedure with Apolinar Zella Evern Sherwood and they wish to proceed with the exams as outlined today.    Past Medical History:  Diagnosis Date   Anemia    Anxiety    Arthritis    Asthma    Cancer (HCC)    skin cancer   Chronic pain    Complication of anesthesia    Asthma attack during colonoscopy and EGD   Congenital hip dislocation    Depression    GERD (gastroesophageal reflux disease)    Iron deficiency anemia 04/14/2018   Kidney mass 2017   Migraine    Osteoporosis    Pathological dislocation of shoulder joint, bilateral    congential   Pneumonia    as a child   PTSD (post-traumatic stress disorder)    Sleep apnea    Hx. of no CPAP   Systemic lupus erythematosus (HCC)    Tuberculosis    as a child was treated   Urticaria     Past Surgical History:  Procedure Laterality Date   CESAREAN SECTION  2005   CESAREAN SECTION W/BTL  2010   HIP SURGERY     in childhood for congenital hip dislocation   LAPAROSCOPIC GASTRIC SLEEVE RESECTION  2014   STAPEDECTOMY  11/01/2011   L postauricular stapedectomy with CO2 laser  and insertion of 6 x 4.75 mm SMart Piston   TOTAL HIP ARTHROPLASTY     05/2000 R, 11/2000 L    Prior to Admission medications   Medication Sig Start Date End Date Taking? Authorizing Provider  acetaminophen  (TYLENOL ) 500 MG tablet Take 500 mg by mouth every 6 (six) hours as needed for headache.   Yes [provider]  albuterol  (VENTOLIN  HFA) 108 (90 Base) MCG/ACT inhaler Inhale 2 puffs into the lungs every 6 (six) hours as needed for wheezing or shortness of breath. 11/18/21  Yes Pray, Rollene BRAVO, MD  atorvastatin  (LIPITOR) 80 MG tablet Take 1 tablet (80 mg total) by mouth daily. 03/03/23  Yes Joshua Domino, DO  azelastine  (ASTELIN ) 0.1 % nasal spray Place 2 sprays into both nostrils 2 (two) times daily. Use in each nostril as directed 08/06/22  Yes Lorin Norris, MD  cycloSPORINE  (RESTASIS ) 0.05 % ophthalmic emulsion Place 1 drop into both eyes 2 (two) times daily. 12/04/19  Yes Henriette Mora, MD  famotidine  (PEPCID ) 20 MG tablet TAKE 1 TABLET BY MOUTH TWICE  DAILY 03/21/23  Yes Lorin Norris, MD  ferrous sulfate  325 (65 FE) MG tablet Take 1 tablet (325 mg total) by mouth 3 (three) times daily with meals. 12/03/20  Yes Burton, Lacie K, NP  FLUoxetine  (PROZAC ) 20 MG capsule Take 1 capsule (20 mg total) by mouth daily. 06/16/23 09/15/23 Yes Arfeen, Leni DASEN, MD  fluticasone  (FLONASE ) 50 MCG/ACT nasal spray Place 2 sprays into both nostrils daily. 07/31/21  Yes Henriette Mora, MD  gabapentin  (NEURONTIN ) 400 MG capsule Take 1 capsule (400 mg total) by mouth at bedtime. 06/16/23  Yes Arfeen, Leni DASEN, MD  levocetirizine (XYZAL ) 5 MG tablet TAKE 1 TABLET BY MOUTH IN THE  EVENING 03/21/23  Yes Cheryl Reusing, FNP  Na Sulfate-K Sulfate-Mg Sulfate concentrate (SUPREP BOWEL PREP KIT) 17.5-3.13-1.6 GM/177ML SOLN Take 1 kit (354 mLs total) by mouth as directed. For colonoscopy prep 07/26/23  Yes Kennedy-Smith, Colleen M, NP  pantoprazole  (PROTONIX ) 40 MG tablet Take 1 tablet (40 mg total) by mouth 2 (two)  times daily. 07/26/23  Yes Kennedy-Smith, Colleen M, NP  traZODone  (DESYREL ) 100 MG tablet Take 1 tablet (100 mg total) by mouth at bedtime. 06/16/23  Yes Arfeen, Leni DASEN, MD  Vitamin D , Ergocalciferol , (DRISDOL ) 50000 units CAPS capsule Take 1 capsule (50,000 Units total) by mouth every 7 (seven) days. 12/16/16  Yes Yoo, Elsia J, DO  budesonide -formoterol  (SYMBICORT ) 160-4.5 MCG/ACT inhaler Inhale 2 puffs into the lungs daily. 07/01/21   Mahoney, Caitlin, MD  cetirizine  (ZYRTEC ) 10 MG tablet Take 1 tablet (10 mg total) by mouth daily. 09/25/21   Lorin Norris, MD  cromolyn  (OPTICROM ) 4 % ophthalmic solution Place 1 drop into both eyes 4 (four) times daily. 04/29/23   Lorin Norris, MD  cyanocobalamin  (,VITAMIN B-12,) 1000 MCG/ML injection Inject 1 mL (1,000 mcg total) into the skin every 30 (thirty) days. 10/25/18   Burton, Lacie K, NP  diclofenac  Sodium (VOLTAREN  ARTHRITIS PAIN) 1 % GEL Apply 4 g topically 4 (four) times daily. Patient not taking: Reported on 09/15/2023 02/25/23   Christia Budds, MD  ELIQUIS  5 MG TABS tablet TAKE 1 TABLET BY MOUTH TWICE  DAILY NEEDS PCP APPOINTMENT  BEFORE MORE FILLS 03/14/23   Christia Budds, MD  EPINEPHrine  0.3 mg/0.3 mL IJ SOAJ injection INJECT INTRAMUSCULARLY 1 PEN AS  NEEDED FOR ALLERGIC RESPONSE AS  DIRECTED BY MD. [facility]  [facility]LP AFTER USE. Patient not taking: Reported on 07/26/2023 08/30/22   Cheryl Reusing, FNP  Erenumab -aooe (AIMOVIG ) 140 MG/ML SOAJ Inject 140 mg into the skin every 30 (thirty) days. 05/17/23   Skeet Juliene SAUNDERS, DO  SYRINGE/NEEDLE, DISP, 1 ML 25G X 5/8 1 ML MISC Match with B12 10/25/18   Burton, Lacie K, NP  Ubrogepant  (UBRELVY ) 100 MG TABS Take 1 tablet (100 mg total) by mouth as needed (May repeat in 2 hours.  Maximum 2 tablets in 24 hours). 05/17/23   Skeet Juliene SAUNDERS, DO    No current facility-administered medications for this encounter.    Allergies as of 07/26/2023 - Review Complete 07/26/2023  Allergen Reaction Noted   Fish allergy   Anaphylaxis 11/17/2016    Family History  Problem Relation Age of Onset   Asthma Mother    Liver cancer Mother    Heart Problems Mother        heart attack and heart failure   Kidney Stones Mother    Osteoporosis Mother    Stroke Mother    Depression Mother    Anxiety disorder Mother    Kidney Stones Father    Hernia Father    Kidney disease Father    Depression Sister    Anxiety disorder Sister    Asthma Sister    Lung cancer Maternal Grandmother  Rectal cancer Maternal Grandfather    Skin cancer Paternal Grandmother        mets to stomach   Mental illness Half-Sister    Cancer Other    ADD / ADHD Son    Bipolar disorder Daughter     Social History   Socioeconomic History   Marital status: Single    Spouse name: Not on file   Number of children: 2   Years of education: some high school   Highest education level: 12th grade  Occupational History   Occupation: Disabled   Tobacco Use   Smoking status: Never    Passive exposure: Past   Smokeless tobacco: Never  Vaping Use   Vaping status: Never Used  Substance and Sexual Activity   Alcohol use: Not Currently    Comment: when she was 53 years old; none after having children    Drug use: No   Sexual activity: Not Currently    Partners: Male    Birth control/protection: Surgical  Other Topics Concern   Not on file  Social History Narrative   Lives with her 2 children and dog here in [place].   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    She likes to spend time with her children and write poems.   Patient lives in one story home, Right handed    Social Drivers of Health   Financial Resource Strain: Medium Risk (08/27/2023)   Overall Financial Resource Strain (CARDIA)    Difficulty of Paying Living Expenses: Somewhat hard  Food Insecurity: Food Insecurity Present (08/27/2023)   Hunger Vital Sign    Worried About Running Out of Food in the Last Year: Often true    Ran Out of Food  in the Last Year: Sometimes true  Transportation Needs: Unmet Transportation Needs (08/27/2023)   PRAPARE - Transportation    Lack of Transportation (Medical): No    Lack of Transportation (Non-Medical): Yes  Physical Activity: Inactive (08/27/2023)   Exercise Vital Sign    Days of Exercise per Week: 0 days    Minutes of Exercise per Session: Not on file  Stress: No Stress Concern Present (08/27/2023)   [employer] of Occupational Health - Occupational Stress Questionnaire    Feeling of Stress: Only a little  Social Connections: Socially Isolated (08/27/2023)   Social Connection and Isolation Panel    Frequency of Communication with Friends and Family: Never    Frequency of Social Gatherings with Friends and Family: Never    Attends Religious Services: Patient declined    Database administrator or Organizations: No    Attends Engineer, structural: Not on file    Marital Status: Never married  Intimate Partner Violence: Not At Risk (07/19/2022)   Humiliation, Afraid, Rape, and Kick questionnaire    Fear of Current or Ex-Partner: No    Emotionally Abused: No    Physically Abused: No    Sexually Abused: No    Review of Systems: All other review of systems negative except as mentioned in the HPI.  Physical Exam: Vital signs BP 121/69   Pulse 66   Temp 97.7 F (36.5 C) (Temporal)   Resp 10   LMP  (LMP Unknown)   SpO2 92%   General:   Alert,  Well-developed, pleasant and cooperative in NAD Lungs:  Clear throughout to auscultation.   Heart:  Regular rate and rhythm Abdomen:  Soft, nontender and nondistended.   Neuro/Psych:  Alert and cooperative.  Normal mood and affect. A and O x 3  Marcey Naval, MD [facility]

## 2023-09-15 NOTE — Discharge Instructions (Signed)

## 2023-09-15 NOTE — Anesthesia Postprocedure Evaluation (Signed)
 Anesthesia Post Note  Patient: Melissa Gaines  Procedure(s) Performed: COLONOSCOPY EGD (ESOPHAGOGASTRODUODENOSCOPY) EGD, WITH DILATION USING SAVARY-GILLIARD DILATOR OVER GUIDEWIRE POLYPECTOMY, INTESTINE     Patient location during evaluation: PACU Anesthesia Type: MAC Level of consciousness: awake and alert Pain management: pain level controlled Vital Signs Assessment: post-procedure vital signs reviewed and stable Respiratory status: spontaneous breathing, nonlabored ventilation and respiratory function stable Cardiovascular status: stable and blood pressure returned to baseline Postop Assessment: no apparent nausea or vomiting Anesthetic complications: no   No notable events documented.  Last Vitals:  Vitals:   09/15/23 0900 09/15/23 0904  BP: (!) 99/51   Pulse: 68   Resp: 18 17  Temp:    SpO2: 95% 97%    Last Pain:  Vitals:   09/15/23 0904  TempSrc:   PainSc: 0-No pain                 [name] A.

## 2023-09-15 NOTE — Op Note (Signed)
 [facility] Patient Name: Melissa Gaines Procedure Date: 09/15/2023 MRN: [id number] Attending MD: Elspeth SQUIBB. Leigh , MD, [id number] Date of Birth: 1970-06-05 CSN: [id number] Age: 53 Admit Type: Outpatient Procedure:                Upper GI endoscopy Indications:              Dysphagia, Follow-up of gastro-esophageal reflux                            disease - history of large hiatal hernia, s/p                            balloon dilation at the GEJ - [facility] to 18mm                            without benefit. on protonix  40mg  BID, symptoms                            persist Providers:                Elspeth SQUIBB. Leigh, MD, Gregoria Pierce, RN,                            Darleene Bare, RN, Corene Southgate, Technician Referring MD:              Medicines:                Monitored Anesthesia Care Complications:            No immediate complications. Estimated blood loss:                            Minimal. Estimated Blood Loss:     Estimated blood loss was minimal. Procedure:                Pre-Anesthesia Assessment:                           - Prior to the procedure, a History and Physical                            was performed, and patient medications and                            allergies were reviewed. The patient's tolerance of                            previous anesthesia was also reviewed. The risks                            and benefits of the procedure and the sedation                            options and risks were discussed with the patient.  All questions were answered, and informed consent                            was obtained. Prior Anticoagulants: The patient has                            taken Eliquis  (apixaban ), last dose was 2 days                            prior to procedure. ASA Grade Assessment: III - A                            patient with severe systemic disease. After                             reviewing the risks and benefits, the patient was                            deemed in satisfactory condition to undergo the                            procedure.                           After obtaining informed consent, the endoscope was                            passed under direct vision. Throughout the                            procedure, the patient's blood pressure, pulse, and                            oxygen saturations were monitored continuously. The                            [id number] ([id number] Olympus endoscope was introduced                            through the mouth, and advanced to the second part                            of duodenum. The upper GI endoscopy was                            accomplished without difficulty. The patient                            tolerated the procedure well. Scope In: Scope Out: Findings:      Esophagogastric landmarks were identified: the Z-line was found at 30       cm, the gastroesophageal junction was found at 30 cm and the upper       extent of the gastric folds was found at 34 cm from the incisors.  A 4 cm hiatal hernia was present.      The examined distal esophagus was quite tortuous entering the hernia sac.      The exam of the esophagus was otherwise normal. I did not appreciate any       significant stenosis of the GEJ.      A guidewire was placed and the scope was withdrawn. Empiric dilation was       performed in the entire esophagus with a Savary dilator with mild       resistance at 16 mm and 17 mm. No mucosal disruption was seen on repeat       EGD.      Evidence of a sleeve gastrectomy was found in the gastric body. This was       characterized by healthy appearing mucosa.      The exam of the stomach was otherwise normal.      The examined duodenum was normal. Impression:               - Esophagogastric landmarks identified.                           - 4 cm hiatal hernia.                           - Tortuous  distal esophagus.                           - Normal esophagus otherwise - empirically dilated                            to 17mm.                           - A sleeve gastrectomy was found, characterized by                            healthy appearing mucosa.                           - Normal examined duodenum.                           Suspect symptoms are coming from hiatal hernia,                            tortous distal esophagus with angulated turn                            entering hernia sac. Empiric dilation performed                            with Savary to see if that will help at all.                            Otherwise for reflux could try Voquezna 10mg  / day  trial, samples at office, to see if that helps in                            the interim. If symptoms persist, consideration for                            hiatal hernia repair but BMI would need to be lower                            to be a candidate for surgery (BMI < 35). Weight                            loss would help symptoms. Moderate Sedation:      No moderate sedation, case performed with MAC Recommendation:           - Patient has a contact number available for                            emergencies. The signs and symptoms of potential                            delayed complications were discussed with the                            patient. Return to normal activities tomorrow.                            Written discharge instructions were provided to the                            patient.                           - Advance diet as tolerated post dilation.                           - Continue present medications.                           - Trial of Voquezna, will see if our office can                            give a trial of samples for 1-2 weeks Procedure Code(s):        --- Professional ---                           [phone], Esophagogastroduodenoscopy, flexible,                             transoral; with insertion of guide wire followed by                            passage of dilator(s) through esophagus over guide  wire Diagnosis Code(s):        --- Professional ---                           K44.9, Diaphragmatic hernia without obstruction or                            gangrene                           Q39.9, Congenital malformation of esophagus,                            unspecified                           Z98.84, Bariatric surgery status                           R13.10, Dysphagia, unspecified                           K21.9, Gastro-esophageal reflux disease without                            esophagitis CPT copyright 2022 American Medical Association. All rights reserved. The codes documented in this report are preliminary and upon coder review may  be revised to meet current compliance requirements. Elspeth P. Laure Leone, MD 09/15/2023 8:50:54 AM This report has been signed electronically. Number of Addenda: 0

## 2023-09-15 NOTE — Telephone Encounter (Signed)
 Called patient to discuss lipid panel results.  LDL elevated to 154 from 116 previously despite taking atorvastatin  80 mg.  Discussed with her that I recommend starting second cholesterol medication Zetia .  Patient agreeable to starting.  Will need to recheck lipid panel in 2 months.  Patient has follow-up in our clinic on 09/19/2023.  10 mg Zetia  daily sent to patient's pharmacy.

## 2023-09-15 NOTE — Transfer of Care (Signed)
 Immediate Anesthesia Transfer of Care Note  Patient: Valisha Heslin  Procedure(s) Performed: COLONOSCOPY EGD (ESOPHAGOGASTRODUODENOSCOPY) EGD, WITH DILATION USING SAVARY-GILLIARD DILATOR OVER GUIDEWIRE POLYPECTOMY, INTESTINE  Patient Location: PACU and Endoscopy Unit  Anesthesia Type:MAC  Level of Consciousness: awake, alert , and oriented  Airway & Oxygen Therapy: Patient Spontanous Breathing and Patient connected to face mask oxygen  Post-op Assessment: Report given to RN and Post -op Vital signs reviewed and stable  Post vital signs: Reviewed and stable  Last Vitals:  Vitals Value Taken Time  BP    Temp    Pulse    Resp    SpO2      Last Pain:  Vitals:   09/15/23 0703  TempSrc: Temporal  PainSc: 0-No pain         Complications: No notable events documented.

## 2023-09-16 ENCOUNTER — Telehealth: Payer: Self-pay

## 2023-09-16 LAB — SURGICAL PATHOLOGY

## 2023-09-16 MED ORDER — VOQUEZNA 10 MG PO TABS: 10.0000 mg | ORAL_TABLET | Freq: Every day | ORAL | Status: DC

## 2023-09-16 NOTE — Telephone Encounter (Signed)
 Jan thanks for seeing this.  Anyway we can set aside 1 to 2 weeks worth of samples for her to see if it helps?  Thanks.  Yes 10 mg dosing

## 2023-09-16 NOTE — Telephone Encounter (Signed)
 Community message sent to [employer] for Rollator and Tub bench. Will await response.   Chiquita JAYSON English, RN

## 2023-09-16 NOTE — Telephone Encounter (Signed)
 Called and spoke to patient.  Let her know I will leave samples of Voquezna  on the 2nd floor for her to pick up.  She said she will have to request a ride on Monday and see when they can bring her one day next week.

## 2023-09-16 NOTE — Telephone Encounter (Signed)
 Per EGD report from 09-15-23, Patient to start Voquezna  samples.  Please advise on dosing. Voquezna  10 mg once daily?  Thank you.

## 2023-09-17 ENCOUNTER — Ambulatory Visit: Payer: Self-pay | Admitting: Gastroenterology

## 2023-09-17 DIAGNOSIS — M1632 Unilateral osteoarthritis resulting from hip dysplasia, left hip: Secondary | ICD-10-CM | POA: Diagnosis not present

## 2023-09-19 ENCOUNTER — Telehealth: Payer: Self-pay

## 2023-09-19 ENCOUNTER — Ambulatory Visit

## 2023-09-19 VITALS — Ht <= 58 in | Wt 188.0 lb

## 2023-09-19 DIAGNOSIS — Z0000 Encounter for general adult medical examination without abnormal findings: Secondary | ICD-10-CM

## 2023-09-19 NOTE — Progress Notes (Signed)
 Because this visit was a virtual/telehealth visit,  certain criteria was not obtained, such a blood pressure, CBG if applicable, and timed get up and go. Any medications not marked as taking were not mentioned during the medication reconciliation part of the visit. Any vitals not documented were not able to be obtained due to this being a telehealth visit or patient was unable to self-report a recent blood pressure reading due to a lack of equipment at home via telehealth. Vitals that have been documented are verbally provided by the patient.   Subjective:   Melissa Gaines is a 53 y.o. who presents for a Medicare Wellness preventive visit.  As a reminder, Annual Wellness Visits don't include a physical exam, and some assessments may be limited, especially if this visit is performed virtually. We may recommend an in-person follow-up visit with your provider if needed.  Visit Complete: Virtual I connected with  Melissa Gaines on 09/19/23 by a audio enabled telemedicine application and verified that I am speaking with the correct person using two identifiers.  Patient Location: Home  Provider Location: Home Office  I discussed the limitations of evaluation and management by telemedicine. The patient expressed understanding and agreed to proceed.  Vital Signs: Because this visit was a virtual/telehealth visit, some criteria may be missing or patient reported. Any vitals not documented were not able to be obtained and vitals that have been documented are patient reported.  VideoDeclined- This patient declined Librarian, academic. Therefore the visit was completed with audio only.  Persons Participating in Visit: Patient.  AWV Questionnaire: No: Patient Medicare AWV questionnaire was not completed prior to this visit.  Cardiac Risk Factors include: advanced age (>73men, >23 women);sedentary lifestyle;dyslipidemia;obesity (BMI  >30kg/m2)     Objective:    Today's Vitals   09/19/23 1154  Weight: 188 lb (85.3 kg)  Height: 4' 9.5 (1.461 m)  PainSc: 2   PainLoc: Leg   Body mass index is 39.98 kg/m.     09/19/2023   11:58 AM 09/15/2023    6:53 AM 09/07/2023   10:03 AM 05/17/2023    8:44 AM 11/17/2022   10:52 AM 11/03/2022    9:32 AM 07/19/2022    6:15 PM  Advanced Directives  Does Patient Have a Medical Advance Directive? Yes Yes Yes Yes Yes No No  Type of Estate agent of [place];Living will Healthcare Power of [place];Living will Healthcare Power of [place];Living will Healthcare Power of [place];Living will Healthcare Power of Attorney    Does patient want to make changes to medical advance directive?   No - Patient declined  No - Patient declined  Yes (MAU/Ambulatory/Procedural Areas - Information given)  Copy of Healthcare Power of Attorney in Chart? No - copy requested  No - copy requested  No - copy requested      Current Medications (verified) Outpatient Encounter Medications as of 09/19/2023  Medication Sig   acetaminophen  (TYLENOL ) 500 MG tablet Take 500 mg by mouth every 6 (six) hours as needed for headache.   albuterol  (VENTOLIN  HFA) 108 (90 Base) MCG/ACT inhaler Inhale 2 puffs into the lungs every 6 (six) hours as needed for wheezing or shortness of breath.   atorvastatin  (LIPITOR) 80 MG tablet Take 1 tablet (80 mg total) by mouth daily.   azelastine  (ASTELIN ) 0.1 % nasal spray Place 2 sprays into both nostrils 2 (two) times daily. Use in each nostril as directed   budesonide -formoterol  (SYMBICORT ) 160-4.5 MCG/ACT inhaler Inhale  2 puffs into the lungs daily.   cetirizine  (ZYRTEC ) 10 MG tablet Take 1 tablet (10 mg total) by mouth daily.   cromolyn  (OPTICROM ) 4 % ophthalmic solution Place 1 drop into both eyes 4 (four) times daily.   cyanocobalamin  (,VITAMIN B-12,) 1000 MCG/ML injection Inject 1 mL (1,000 mcg total) into the skin every 30 (thirty) days.   cycloSPORINE   (RESTASIS ) 0.05 % ophthalmic emulsion Place 1 drop into both eyes 2 (two) times daily.   diclofenac  Sodium (VOLTAREN  ARTHRITIS PAIN) 1 % GEL Apply 4 g topically 4 (four) times daily. (Patient not taking: Reported on 09/15/2023)   ELIQUIS  5 MG TABS tablet TAKE 1 TABLET BY MOUTH TWICE  DAILY NEEDS PCP APPOINTMENT  BEFORE MORE FILLS   EPINEPHrine  0.3 mg/0.3 mL IJ SOAJ injection INJECT INTRAMUSCULARLY 1 PEN AS  NEEDED FOR ALLERGIC RESPONSE AS  DIRECTED BY MD. [facility]  HELP AFTER USE. (Patient not taking: Reported on 07/26/2023)   Erenumab -aooe (AIMOVIG ) 140 MG/ML SOAJ Inject 140 mg into the skin every 30 (thirty) days.   ezetimibe  (ZETIA ) 10 MG tablet Take 1 tablet (10 mg total) by mouth daily.   famotidine  (PEPCID ) 20 MG tablet TAKE 1 TABLET BY MOUTH TWICE  DAILY   ferrous sulfate  325 (65 FE) MG tablet Take 1 tablet (325 mg total) by mouth 3 (three) times daily with meals.   FLUoxetine  (PROZAC ) 20 MG capsule Take 1 capsule (20 mg total) by mouth daily.   fluticasone  (FLONASE ) 50 MCG/ACT nasal spray Place 2 sprays into both nostrils daily.   gabapentin  (NEURONTIN ) 400 MG capsule Take 1 capsule (400 mg total) by mouth at bedtime.   levocetirizine (XYZAL ) 5 MG tablet TAKE 1 TABLET BY MOUTH IN THE  EVENING   Na Sulfate-K Sulfate-Mg Sulfate concentrate (SUPREP BOWEL PREP KIT) 17.5-3.13-1.6 GM/177ML SOLN Take 1 kit (354 mLs total) by mouth as directed. For colonoscopy prep   SYRINGE/NEEDLE, DISP, 1 ML 25G X 5/8 1 ML MISC Match with B12   traZODone  (DESYREL ) 100 MG tablet Take 1 tablet (100 mg total) by mouth at bedtime.   Ubrogepant  (UBRELVY ) 100 MG TABS Take 1 tablet (100 mg total) by mouth as needed (May repeat in 2 hours.  Maximum 2 tablets in 24 hours).   Vitamin D , Ergocalciferol , (DRISDOL ) 50000 units CAPS capsule Take 1 capsule (50,000 Units total) by mouth every 7 (seven) days.   Vonoprazan Fumarate  (VOQUEZNA ) 10 MG TABS Take 10 mg by mouth daily. Lot: [id number], Exp 05-2024   No  facility-administered encounter medications on file as of 09/19/2023.    Allergies (verified) Fish allergy    History: Past Medical History:  Diagnosis Date   Anemia    Anxiety    Arthritis    Asthma    Cancer (HCC)    skin cancer   Chronic pain    Complication of anesthesia    Asthma attack during colonoscopy and EGD   Congenital hip dislocation    Depression    GERD (gastroesophageal reflux disease)    Iron deficiency anemia 04/14/2018   Kidney mass 2017   Migraine    Osteoporosis    Pathological dislocation of shoulder joint, bilateral    congential   Pneumonia    as a child   PTSD (post-traumatic stress disorder)    Sleep apnea    Hx. of no CPAP   Systemic lupus erythematosus (HCC)    Tuberculosis    as a child was treated   Urticaria    Past Surgical History:  Procedure Laterality Date   CESAREAN SECTION  2005   CESAREAN SECTION W/BTL  2010   COLONOSCOPY N/A 09/15/2023   Procedure: COLONOSCOPY;  Surgeon: Leigh Elspeth SQUIBB, MD;  Location: [facility];  Service: Gastroenterology;  Laterality: N/A;   ESOPHAGOGASTRODUODENOSCOPY N/A 09/15/2023   Procedure: EGD (ESOPHAGOGASTRODUODENOSCOPY);  Surgeon: Leigh Elspeth SQUIBB, MD;  Location: [facility];  Service: Gastroenterology;  Laterality: N/A;   HIP SURGERY     in childhood for congenital hip dislocation   LAPAROSCOPIC GASTRIC SLEEVE RESECTION  2014   POLYPECTOMY  09/15/2023   Procedure: POLYPECTOMY, INTESTINE;  Surgeon: Leigh Elspeth SQUIBB, MD;  Location: [facility];  Service: Gastroenterology;;   HARLEY DILATION N/A 09/15/2023   Procedure: EGD, WITH DILATION USING SAVARY-GILLIARD DILATOR OVER GUIDEWIRE;  Surgeon: Leigh Elspeth SQUIBB, MD;  Location: [facility];  Service: Gastroenterology;  Laterality: N/A;   STAPEDECTOMY  11/01/2011   L postauricular stapedectomy with CO2 laser and insertion of 6 x 4.75 mm SMart Piston   TOTAL HIP ARTHROPLASTY     05/2000 R, 11/2000 L   Family History  Problem Relation  Age of Onset   Asthma Mother    Liver cancer Mother    Heart Problems Mother        heart attack and heart failure   Kidney Stones Mother    Osteoporosis Mother    Stroke Mother    Depression Mother    Anxiety disorder Mother    Kidney Stones Father    Hernia Father    Kidney disease Father    Depression Sister    Anxiety disorder Sister    Asthma Sister    Lung cancer Maternal Grandmother    Rectal cancer Maternal Grandfather    Skin cancer Paternal Grandmother        mets to stomach   Mental illness Half-Sister    Cancer Other    ADD / ADHD Son    Bipolar disorder Daughter    Social History   Socioeconomic History   Marital status: Single    Spouse name: Not on file   Number of children: 2   Years of education: some high school   Highest education level: 12th grade  Occupational History   Occupation: Disabled   Tobacco Use   Smoking status: Never    Passive exposure: Past   Smokeless tobacco: Never  Vaping Use   Vaping status: Never Used  Substance and Sexual Activity   Alcohol use: Not Currently    Comment: when she was 53 years old; none after having children    Drug use: No   Sexual activity: Not Currently    Partners: Male    Birth control/protection: Surgical  Other Topics Concern   Not on file  Social History Narrative   Lives with her 2 children and dog here in [place].   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    She likes to spend time with her children and write poems.   Patient lives in one story home, Right handed    Social Drivers of Health   Financial Resource Strain: Medium Risk (09/19/2023)   Overall Financial Resource Strain (CARDIA)    Difficulty of Paying Living Expenses: Somewhat hard  Food Insecurity: Food Insecurity Present (09/19/2023)   Hunger Vital Sign    Worried About Running Out of Food in the Last Year: Often true    Ran Out of Food in the Last Year: Sometimes true  Transportation Needs: Unmet  Transportation  Needs (09/19/2023)   PRAPARE - Transportation    Lack of Transportation (Medical): No    Lack of Transportation (Non-Medical): Yes  Physical Activity: Inactive (09/19/2023)   Exercise Vital Sign    Days of Exercise per Week: 0 days    Minutes of Exercise per Session: 0 min  Stress: No Stress Concern Present (09/19/2023)   [employer] of Occupational Health - Occupational Stress Questionnaire    Feeling of Stress: Only a little  Social Connections: Socially Isolated (09/19/2023)   Social Connection and Isolation Panel    Frequency of Communication with Friends and Family: Never    Frequency of Social Gatherings with Friends and Family: Never    Attends Religious Services: Patient declined    Database administrator or Organizations: No    Attends Engineer, structural: Never    Marital Status: Never married    Tobacco Counseling Counseling given: Not Answered    Clinical Intake:  Pre-visit preparation completed: Yes  Pain : 0-10 Pain Score: 2  Pain Type: Chronic pain Pain Location: Leg Pain Orientation: Left     BMI - recorded: 39.98 Nutritional Status: BMI > 30  Obese Nutritional Risks: None Diabetes: No  Lab Results  Component Value Date   HGBA1C 5.4 05/07/2022   HGBA1C 5.9 (H) 07/01/2021   HGBA1C 5.5 07/22/2020     How often do you need to have someone help you when you read instructions, pamphlets, or other written materials from your doctor or pharmacy?: 1 - Never  Interpreter Needed?: No  Information entered by :: Melissa Fuller, LPN.   Activities of Daily Living     09/19/2023   11:59 AM  In your present state of health, do you have any difficulty performing the following activities:  Hearing? 1  Vision? 0  Difficulty concentrating or making decisions? 0  Walking or climbing stairs? 1  Dressing or bathing? 1  Doing errands, shopping? 1  Preparing Food and eating ? Y  Using the Toilet? N  In the past six months, have  you accidently leaked urine? Y  Do you have problems with loss of bowel control? N  Managing your Medications? N  Managing your Finances? N  Housekeeping or managing your Housekeeping? N    Patient Care Team: Alba Sharper, MD as PCP - General (Family Medicine) Lonni Slain, MD as PCP - Cardiology (Cardiology) Skeet Juliene SAUNDERS, DO as Consulting Physician (Neurology) Lanny Callander, MD as Consulting Physician (Hematology and Oncology) Curry Leni DASEN, MD as Consulting Physician (Psychiatry) [facility] as Consulting Physician (Ophthalmology)  I have updated your Care Teams any recent Medical Services you may have received from other providers in the past year.     Assessment:   This is a routine wellness examination for Melissa Gaines.  Hearing/Vision screen Hearing Screening - Comments:: Patient wears hearing aids. Vision Screening - Comments:: Wears rx glasses - up to date with [facility] eye exams with [facility]    Goals Addressed             This Visit's Progress    09/19/23: To have personal care hours increased.       Weight (lb) < 200 lb (90.7 kg)   188 lb (85.3 kg)      Depression Screen     09/19/2023   12:00 PM 06/16/2023   10:42 AM 02/25/2023    1:15 PM 07/19/2022    6:11 PM 05/07/2022   10:02 AM 07/31/2021  1:30 PM 07/01/2021    1:47 PM  PHQ 2/9 Scores  PHQ - 2 Score 0  2 0 2 2 2   PHQ- 9 Score 0  4  6 8 8      Information is confidential and restricted. Go to Review Flowsheets to unlock data.    Fall Risk     09/19/2023   11:58 AM 05/17/2023    8:44 AM 11/03/2022    9:32 AM 07/19/2022    6:13 PM 02/20/2020    9:07 AM  Fall Risk   Falls in the past year? 1 0 0 0 0  Number falls in past yr: 1 0 0 0 0  Injury with Fall? 1 0 0 0 0  Risk for fall due to : History of fall(s);Impaired balance/gait;Orthopedic patient   Impaired balance/gait;Impaired mobility   Follow up Falls evaluation completed;Education provided Falls evaluation completed  Falls evaluation completed Falls prevention discussed;Education provided;Falls evaluation completed     MEDICARE RISK AT HOME:  Medicare Risk at Home Any stairs in or around the home?: Yes If so, are there any without handrails?: No Home free of loose throw rugs in walkways, pet beds, electrical cords, etc?: Yes Adequate lighting in your home to reduce risk of falls?: Yes Life alert?: No Use of a cane, walker or w/c?: Yes Grab bars in the bathroom?: No Shower chair or bench in shower?: Yes Elevated toilet seat or a handicapped toilet?: No  TIMED UP AND GO:  Was the test performed?  No  Cognitive Function: Declined/Normal: No cognitive concerns noted by patient or family. Patient alert, oriented, able to answer questions appropriately and recall recent events. No signs of memory loss or confusion.    09/19/2023   12:00 PM  MMSE - Mini Mental State Exam  Not completed: Unable to complete        09/19/2023   12:11 PM 07/19/2022    6:14 PM 10/30/2018   10:27 AM  6CIT Screen  What Year? 0 points 0 points 0 points  What month? 0 points 0 points 0 points  What time? 0 points 0 points 0 points  Count back from 20 0 points 0 points 0 points  Months in reverse 0 points 0 points 0 points  Repeat phrase 0 points 0 points 0 points  Total Score 0 points 0 points 0 points    Immunizations Immunization History  Administered Date(s) Administered   Influenza, Seasonal, Injecte, Preservative Fre 02/14/2023   Influenza,inj,Quad PF,6+ Mos 11/15/2016, 12/03/2020   PFIZER(Purple Top)SARS-COV-2 Vaccination 10/05/2019, 10/29/2019   Pfizer(Comirnaty)Fall Seasonal Vaccine 12 years and older 02/25/2023   Tdap 12/16/2016    Screening Tests Health Maintenance  Topic Date Due   Pneumococcal Vaccine 72-68 Years old (1 of 2 - PCV) Never done   Hepatitis B Vaccines (1 of 3 - 19+ 3-dose series) Never done   Zoster Vaccines- Shingrix (1 of 2) Never done   COVID-19 Vaccine (4 - Pfizer risk 2024-25  season) 08/26/2023   INFLUENZA VACCINE  10/07/2023   Medicare Annual Wellness (AWV)  09/18/2024   MAMMOGRAM  03/24/2025   Cervical Cancer Screening (HPV/Pap Cotest)  07/02/2026   DTaP/Tdap/Td (2 - Td or Tdap) 12/17/2026   Colonoscopy  09/14/2033   Hepatitis C Screening  Completed   HIV Screening  Completed   HPV VACCINES  Aged Out   Meningococcal B Vaccine  Aged Out    Health Maintenance  Health Maintenance Due  Topic Date Due   Pneumococcal Vaccine 19-64 Years  old (1 of 2 - PCV) Never done   Hepatitis B Vaccines (1 of 3 - 19+ 3-dose series) Never done   Zoster Vaccines- Shingrix (1 of 2) Never done   COVID-19 Vaccine (4 - Pfizer risk 2024-25 season) 08/26/2023   Health Maintenance Items Addressed: Yes, .  Immunization record was verified by [employer]. Patient is aware of current care gaps.  Patient is due for Covid-19, Hepatitis B series, Pneumonia and Shingrix vaccines.   Additional Screening:  Vision Screening: Recommended annual ophthalmology exams for early detection of glaucoma and other disorders of the eye. Would you like a referral to an eye doctor? No    Dental Screening: Recommended annual dental exams for proper oral hygiene  Community Resource Referral / Chronic Care Management: CRR required this visit?  No   CCM required this visit?  No   Plan:    I have personally reviewed and noted the following in the patient's chart:   Medical and social history Use of alcohol, tobacco or illicit drugs  Current medications and supplements including opioid prescriptions. Patient is not currently taking opioid prescriptions. Functional ability and status Nutritional status Physical activity Advanced directives List of other physicians Hospitalizations, surgeries, and ER visits in previous 12 months Vitals Screenings to include cognitive, depression, and falls Referrals and appointments  In addition, I have reviewed and discussed with patient certain preventive  protocols, quality metrics, and best practice recommendations. A written personalized care plan for preventive services as well as general preventive health recommendations were provided to patient.   Melissa LOISE Fuller, LPN   [id number]/85/[id number]   After Visit Summary: (MyChart) Due to this being a telephonic visit, the after visit summary with patients personalized plan was offered to patient via MyChart   Notes: Patient is aware of current care gaps.  Patient is due for Covid-19, Hepatitis B series, Pneumonia and Shingrix vaccines.

## 2023-09-19 NOTE — Telephone Encounter (Signed)
 Patient stated during AWV today that she needed the following issues addressed. Patient is requesting more PCS visits. Currently the aid comes Mon-Sat for 2-3 hours per day.  No holidays.  Patient uses Nurse, mental health and stated that she needed more hours due to recent falls and unsteady gait. Also patient is requesting a weight loss medication, rather it be pill form or injection to help her get  down to a decent weight.  Current weight 188 pounds and height 4'9 please advise.  Shalina Norfolk N. Tomie, LPN [employer]

## 2023-09-19 NOTE — Patient Instructions (Signed)
 [name] , Thank you for taking time out of your busy schedule to complete your Annual Wellness Visit with me. I enjoyed our conversation and look forward to speaking with you again next year. I, as well as your care team,  appreciate your ongoing commitment to your health goals. Please review the following plan we discussed and let me know if I can assist you in the future. Your Game plan/ To Do List    Referrals: If you haven't heard from the office you've been referred to, please reach out to them at the phone provided.   Follow up Visits: Next Medicare AWV with our clinical staff: 09/20/2024 at 11:50 a.m. phone visit with Nurse   Have you seen your provider in the last 6 months (3 months if uncontrolled diabetes)? Yes, last seen 09/13/2023 with Dr. Alba Next Office Visit with your provider: Office will call patient to schedule as needed  Clinician Recommendations:  Aim for 30 minutes of exercise or brisk walking, 6-8 glasses of water, and 5 servings of fruits and vegetables each day.       This is a list of the screening recommended for you and due dates:  Health Maintenance  Topic Date Due   Pneumococcal Vaccination (1 of 2 - PCV) Never done   Hepatitis B Vaccine (1 of 3 - 19+ 3-dose series) Never done   Zoster (Shingles) Vaccine (1 of 2) Never done   COVID-19 Vaccine (4 - Pfizer risk 2024-25 season) 08/26/2023   Flu Shot  10/07/2023   Medicare Annual Wellness Visit  09/18/2024   Mammogram  03/24/2025   Pap with HPV screening  07/02/2026   DTaP/Tdap/Td vaccine (2 - Td or Tdap) 12/17/2026   Colon Cancer Screening  09/14/2033   Hepatitis C Screening  Completed   HIV Screening  Completed   HPV Vaccine  Aged Out   Meningitis B Vaccine  Aged Out    Advanced directives: (Copy Requested) Please bring a copy of your health care power of attorney and living will to the office to be added to your chart at your convenience. You can mail to [facility] [address] [place], [place] [address] or email to [email or web address] Advance Care Planning is important because it:  [x]  Makes sure you receive the medical care that is consistent with your values, goals, and preferences  [x]  It provides guidance to your family and loved ones and reduces their decisional burden about whether or not they are making the right decisions based on your wishes.  Follow the link provided in your after visit summary or read over the paperwork we have mailed to you to help you started getting your Advance Directives in place. If you need assistance in completing these, please reach out to us  so that we can help you!  See attachments for Preventive Care and Fall Prevention Tips.

## 2023-09-19 NOTE — Telephone Encounter (Signed)
 Receipt confirmed by [employer].   Veronda Prude, RN

## 2023-09-20 ENCOUNTER — Encounter (HOSPITAL_COMMUNITY): Payer: Self-pay | Admitting: Internal Medicine

## 2023-09-20 NOTE — Progress Notes (Signed)
 Patient is scheduled for 08/11 @310 

## 2023-09-21 ENCOUNTER — Encounter (HOSPITAL_COMMUNITY): Payer: Self-pay | Admitting: Internal Medicine

## 2023-09-22 ENCOUNTER — Other Ambulatory Visit: Payer: Self-pay

## 2023-09-22 ENCOUNTER — Ambulatory Visit (HOSPITAL_BASED_OUTPATIENT_CLINIC_OR_DEPARTMENT_OTHER): Admitting: Psychiatry

## 2023-09-22 ENCOUNTER — Encounter (HOSPITAL_COMMUNITY): Payer: Self-pay | Admitting: Psychiatry

## 2023-09-22 DIAGNOSIS — F4310 Post-traumatic stress disorder, unspecified: Secondary | ICD-10-CM

## 2023-09-22 DIAGNOSIS — F331 Major depressive disorder, recurrent, moderate: Secondary | ICD-10-CM

## 2023-09-22 MED ORDER — FLUOXETINE HCL 20 MG PO CAPS: 20.0000 mg | ORAL_CAPSULE | Freq: Every day | ORAL | 0 refills | Status: DC

## 2023-09-22 MED ORDER — TRAZODONE HCL 100 MG PO TABS: 100.0000 mg | ORAL_TABLET | Freq: Every day | ORAL | 0 refills | Status: DC

## 2023-09-22 MED ORDER — GABAPENTIN 400 MG PO CAPS: 400.0000 mg | ORAL_CAPSULE | Freq: Every day | ORAL | 0 refills | Status: DC
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
## 2023-09-28 ENCOUNTER — Other Ambulatory Visit: Payer: Self-pay | Admitting: Family Medicine

## 2023-09-28 ENCOUNTER — Other Ambulatory Visit: Payer: Self-pay | Admitting: Family

## 2023-09-28 ENCOUNTER — Other Ambulatory Visit: Payer: Self-pay | Admitting: Nurse Practitioner

## 2023-09-28 ENCOUNTER — Other Ambulatory Visit (HOSPITAL_COMMUNITY): Payer: Self-pay | Admitting: Psychiatry

## 2023-09-28 DIAGNOSIS — F331 Major depressive disorder, recurrent, moderate: Secondary | ICD-10-CM

## 2023-09-28 DIAGNOSIS — F4310 Post-traumatic stress disorder, unspecified: Secondary | ICD-10-CM

## 2023-09-29 ENCOUNTER — Ambulatory Visit: Attending: Family Medicine | Admitting: Physical Therapy

## 2023-09-29 ENCOUNTER — Encounter: Payer: Self-pay | Admitting: Physical Therapy

## 2023-09-29 ENCOUNTER — Other Ambulatory Visit: Payer: Self-pay

## 2023-09-29 DIAGNOSIS — G8929 Other chronic pain: Secondary | ICD-10-CM | POA: Diagnosis not present

## 2023-09-29 DIAGNOSIS — R2689 Other abnormalities of gait and mobility: Secondary | ICD-10-CM | POA: Diagnosis not present

## 2023-09-29 DIAGNOSIS — M545 Low back pain, unspecified: Secondary | ICD-10-CM | POA: Insufficient documentation

## 2023-09-29 DIAGNOSIS — M6281 Muscle weakness (generalized): Secondary | ICD-10-CM | POA: Diagnosis not present

## 2023-09-29 DIAGNOSIS — M25561 Pain in right knee: Secondary | ICD-10-CM | POA: Insufficient documentation

## 2023-09-29 NOTE — Patient Instructions (Signed)

## 2023-09-29 NOTE — Therapy (Addendum)
 OUTPATIENT PHYSICAL THERAPY THORACOLUMBAR EVALUATION   Patient Name: Melissa Gaines MRN: [id number] DOB:09-02-1970, 53 y.o., female Today's Date: 09/29/2023  END OF SESSION:  PT End of Session - 09/29/23 1233     Visit Number 1    Number of Visits 13    Date for PT Re-Evaluation 11/10/23    PT Start Time 1045    PT Stop Time 1125    PT Time Calculation (min) 40 min    Activity Tolerance Patient tolerated treatment well    Behavior During Therapy [facility] for tasks assessed/performed          Past Medical History:  Diagnosis Date   Anemia    Anxiety    Arthritis    Asthma    Cancer (HCC)    skin cancer   Chronic pain    Complication of anesthesia    Asthma attack during colonoscopy and EGD   Congenital hip dislocation    Depression    GERD (gastroesophageal reflux disease)    Iron deficiency anemia 04/14/2018   Kidney mass 2017   Migraine    Osteoporosis    Pathological dislocation of shoulder joint, bilateral    congential   Pneumonia    as a child   PTSD (post-traumatic stress disorder)    Sleep apnea    Hx. of no CPAP   Systemic lupus erythematosus (HCC)    Tuberculosis    as a child was treated   Urticaria    Past Surgical History:  Procedure Laterality Date   CESAREAN SECTION  2005   CESAREAN SECTION W/BTL  2010   COLONOSCOPY N/A 09/15/2023   Procedure: COLONOSCOPY;  Surgeon: Leigh Elspeth SQUIBB, MD;  Location: [facility];  Service: Gastroenterology;  Laterality: N/A;   ESOPHAGOGASTRODUODENOSCOPY N/A 09/15/2023   Procedure: EGD (ESOPHAGOGASTRODUODENOSCOPY);  Surgeon: Leigh Elspeth SQUIBB, MD;  Location: [facility];  Service: Gastroenterology;  Laterality: N/A;   HIP SURGERY     in childhood for congenital hip dislocation   LAPAROSCOPIC GASTRIC SLEEVE RESECTION  2014   POLYPECTOMY  09/15/2023   Procedure: POLYPECTOMY, INTESTINE;  Surgeon: Leigh Elspeth SQUIBB, MD;  Location: [facility];  Service: Gastroenterology;;   HARLEY DILATION  N/A 09/15/2023   Procedure: EGD, WITH DILATION USING SAVARY-GILLIARD DILATOR OVER GUIDEWIRE;  Surgeon: Leigh Elspeth SQUIBB, MD;  Location: [facility];  Service: Gastroenterology;  Laterality: N/A;   STAPEDECTOMY  11/01/2011   L postauricular stapedectomy with CO2 laser and insertion of 6 x 4.75 mm SMart Piston   TOTAL HIP ARTHROPLASTY     05/2000 R, 11/2000 L   Patient Active Problem List   Diagnosis Date Noted   Dysphagia 09/15/2023   Benign neoplasm of colon 09/15/2023   B12 deficiency 05/11/2022   Hyperlipidemia 05/07/2022   Prediabetes 05/07/2022   Environmental and seasonal allergies 08/03/2021   History of colonoscopy 07/31/2021   Asthma 07/01/2021   Gastroesophageal reflux disease 08/19/2020   Tinnitus aurium, 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   Lumbar back pain 01/25/2020   Left hip pain 06/26/2019   Muscle strain of lower extremity, left, initial encounter 06/26/2019   Colon cancer screening 06/26/2019   Iron deficiency anemia 04/14/2018   Hematuria, gross 03/29/2018   Iron deficiency anemia due to chronic blood loss 03/29/2018   Long term current use of anticoagulant 03/29/2018   Antiphospholipid antibody syndrome (HCC) 01/25/2018   Morbid obesity (HCC) 07/26/2017   Systemic lupus erythematosus (HCC) 07/08/2017   Hx pulmonary embolism 07/08/2017   Chronic pain of right knee 05/23/2017  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/21/2016   Vitamin D  deficiency 12/16/2016   Anemia    Migraine     PCP: Alba Sharper, MD  REFERRING PROVIDER: Madelon Donald HERO, DO  REFERRING DIAG: Functional gait abnormality [R26.89], Chronic pain of right knee [M25.561, G89.29], Lumbar back pain [M54.50]   Rationale for Evaluation and Treatment: Rehabilitation  THERAPY DIAG:  Chronic pain of right knee  Other abnormalities of gait and mobility  Muscle weakness (generalized)  PERTINENT HISTORY: Speech impediment, anemia, lupus, tinnitus, astham, prediabetes, vit D deficiency, knee surgery in early  2000s  WEIGHT BEARING RESTRICTIONS: No  FALLS:  Has patient fallen in last 6 months? Yes. Number of falls 5, knee gave out and she lost her balance, multiple times on the stairs   LIVING ENVIRONMENT: Lives with: lives with their family Lives in: House/apartment Stairs: Yes: External: 12 steps; recently moved to the first floor of complex Has following equipment at home: Single point cane and Walker - 4 wheeled  OCCUPATION: on disability   PRECAUTIONS: None ---------------------------------------------------------------------------------------------  SUBJECTIVE:                                                                                                                                                                                           SUBJECTIVE STATEMENT: Eval statement 09/29/2023: Has been having pain for many years, pain has slowly gotten worse, recently in past 2 months has fallen 5 times because her R knee has buckled and she lost her balance. Started using a FWW to ambulate and has not had falls since, however has dropped the walker a few times, and does not always use it for short distances. Pain: 9/10 in R knee, feel like an electrocution or numbness, or locking feeling.  RED FLAGS: None    PLOF: Independent  PATIENT GOALS: stop the knee from giving away  NEXT MD VISIT: need to schedule ---------------------------------------------------------------------------------------------  OBJECTIVE:  Note: Objective measures were completed at Evaluation unless otherwise noted.  DIAGNOSTIC FINDINGS:  No pertinent imaging in chart  PATIENT SURVEYS:  LEFS  Extreme difficulty/unable (0), Quite a bit of difficulty (1), Moderate difficulty (2), Little difficulty (3), No difficulty (4) Survey date:  09/29/2023  Any of your usual work, housework or school activities   2. Usual hobbies, recreational or sporting activities   3. Getting into/out of the bath   4. Walking  between rooms   5. Putting on socks/shoes   6. Squatting    7. Lifting an object, like a bag of groceries from the floor   8. Performing light activities around your home   9. Performing heavy activities around your home  10. Getting into/out of a car   11. Walking 2 blocks   12. Walking 1 mile   13. Going up/down 10 stairs (1 flight)   14. Standing for 1 hour   15.  sitting for 1 hour   16. Running on even ground   17. Running on uneven ground   18. Making sharp turns while running fast   19. Hopping    20. Rolling over in bed   Score total:  7/80     COGNITION: Overall cognitive status: Within functional limits for tasks assessed   PALPATION: Tenderness around R knee  SENSATION: Light touch: Impaired  around RLE  MUSCLE LENGTH: Hamstrings:B WFL  Thomas test: Right Limited R gastroc Limited  POSTURE: rounded shoulders and forward head   LUMBAR ROM:   AROM eval  Flexion   Extension   Right lateral flexion   Left lateral flexion   Right rotation   Left rotation    (Blank rows = not tested)  ! Indicates pain with testing  LOWER EXTREMITY ROM:     Active  Right eval Left eval  Hip flexion    Hip extension    Hip abduction    Hip adduction    Hip internal rotation    Hip external rotation    Knee flexion    Knee extension    Ankle dorsiflexion    Ankle plantarflexion    Ankle inversion    Ankle eversion     (Blank rows = not tested)  ! Indicates pain with testing  LOWER EXTREMITY MMT:    MMT Right eval Left eval  Hip flexion 100 WFL  Hip extension [facility]  Hip abduction    Hip adduction    Hip internal rotation    Hip external rotation    Knee flexion 110 WFL  Knee extension 0 8  Ankle dorsiflexion 10 10  Ankle plantarflexion    Ankle inversion    Ankle eversion     (Blank rows = not tested)   ! Indicates pain with testing LUMBAR SPECIAL TESTS:  Prone instability test: Positive  FUNCTIONAL TESTS:  30 seconds chair stand test  10  GAIT: Distance walked: 123ft Assistive device utilized: Walker - 4 wheeled Level of assistance: Modified independence Comments: L WS, R trendelenburg, reduced RLE quad recruitment in stance  [facility] Adult PT Treatment:                                                DATE: 09/29/2023 Self Care: Pt education Poc discussion                                                                                                                                PATIENT EDUCATION:  Education details: Pt received education regarding HEP performance, ADL  performance, functional activity tolerance, impairment education, appropriate performance of therapeutic activities. DKE use Person educated: Patient Education method: Explanation, Demonstration, Tactile cues, Verbal cues, and Handouts Education comprehension: verbalized understanding and returned demonstration  HOME EXERCISE PROGRAM: Access Code: [email or web address] URL: [email or web address] Date: 09/29/2023 Prepared by: Mabel Kiang  Exercises - Sit to Stand Without Arm Support  - 1 x daily - 4 x weekly - 2-3 sets - 15 reps - Sidelying Hip Abduction  - 1 x daily - 4 x weekly - 2-3 sets - 10 reps - 3s hold - Supine March  - 1 x daily - 7 x weekly - 2-3 sets - 10 reps - Active Straight Leg Raise with Quad Set  - 1 x daily - 4 x weekly - 2-3 sets - 15 reps - 3s hold ---------------------------------------------------------------------------------------------  ASSESSMENT:  CLINICAL IMPRESSION: Eval impression (09/29/2023): Pt. attended today's physical therapy session for evaluation of gait and R knee pain. Pt has complaints of R knee pain and buckling causing. Pt has notable deficits and would benefit from therapeutic focus on knee AROM, strength, muscle contraction, muscle length, and gait quality.Treatment performed today focused on pt education detailed in the objective. Pt demonstrated great understanding of education provided. required  moderate cues and supervisory assistance for appropriate performance with today's activities. Pt requires the intervention of skilled outpatient physical therapy to address the aforementioned deficits and progress towards a functional level in line with therapeutic goals.    OBJECTIVE IMPAIRMENTS: Abnormal gait, decreased mobility, difficulty walking, decreased ROM, decreased strength, improper body mechanics, postural dysfunction, and pain.   ACTIVITY LIMITATIONS: standing, squatting, sleeping, stairs, dressing, self feeding, and locomotion level  PARTICIPATION LIMITATIONS: meal prep, cleaning, shopping, and community activity  PERSONAL FACTORS: Behavior pattern, Fitness, Time since onset of injury/illness/exacerbation, Transportation, and 3+ comorbidities: see pmh are also affecting patient's functional outcome.   REHAB POTENTIAL: Fair personal factors  CLINICAL DECISION MAKING: Stable/uncomplicated  EVALUATION COMPLEXITY: Low   GOALS: Goals reviewed with patient? YES  SHORT TERM GOALS: Target date: 10/20/2023  Pt will be independent with administered HEP to demonstrate the competency necessary for long term managemnet of symptoms at home. Baseline: Goal status: INITIAL   LONG TERM GOALS: Target date: 11/10/2023  Pt. Will achieve a LEFS score of 20/80 as to demonstrate improvement in self-perceived functional ability with daily activities.  Baseline:  Goal status: INITIAL  2.  Pt will improve Global hip strength to a 4+/5 to demonstrate improvement in strength for quality of motion and activity performance.  Baseline:  Goal status: INITIAL  3.   Pt will independently ambulate 10 minutes with LRAD and reduced R sided trendelenburg to demonstrate improved weightbearing tolerance, BLE strength, and functional capacity for community ambulation.  Baseline:  Goal status: INITIAL  4.  Pt will improve 30s STS to 12 to demonstrate improved BLE strength, coordination, and functional  transfer ability necessary for daily life Baseline: 8 Goal status: INITIAL ---------------------------------------------------------------------------------------------  PLAN:  PT FREQUENCY: 1-2x/week  PT DURATION: 6 weeks  PLANNED INTERVENTIONS: 97110-Therapeutic exercises, 97530- Therapeutic activity, [id number]- Neuromuscular re-education, 97535- Self Care, [id number]- Manual therapy, [phone]- Gait training, [phone]- Aquatic Therapy, Patient/Family education, Balance training, Stair training, Taping, Joint mobilization, and Spinal mobilization.  PLAN FOR NEXT SESSION: Review HEP, Begin POC as detailed in assessment   Mabel Kiang, PT, DPT 09/29/2023, 12:40 PM

## 2023-09-30 ENCOUNTER — Ambulatory Visit

## 2023-09-30 DIAGNOSIS — J309 Allergic rhinitis, unspecified: Secondary | ICD-10-CM | POA: Diagnosis not present

## 2023-10-04 ENCOUNTER — Encounter: Admitting: Physical Therapy

## 2023-10-05 ENCOUNTER — Inpatient Hospital Stay

## 2023-10-05 DIAGNOSIS — D508 Other iron deficiency anemias: Secondary | ICD-10-CM

## 2023-10-05 DIAGNOSIS — E538 Deficiency of other specified B group vitamins: Secondary | ICD-10-CM | POA: Diagnosis not present

## 2023-10-05 MED ORDER — CYANOCOBALAMIN 1000 MCG/ML IJ SOLN
1000.0000 ug | INTRAMUSCULAR | Status: DC
  Administered 2023-10-05: 1000 ug via INTRAMUSCULAR
  Filled 2023-10-05: qty 1

## 2023-10-07 ENCOUNTER — Ambulatory Visit: Attending: Family Medicine | Admitting: Physical Therapy

## 2023-10-07 ENCOUNTER — Other Ambulatory Visit: Payer: Self-pay | Admitting: Nurse Practitioner

## 2023-10-07 DIAGNOSIS — M6281 Muscle weakness (generalized): Secondary | ICD-10-CM | POA: Diagnosis not present

## 2023-10-07 DIAGNOSIS — G8929 Other chronic pain: Secondary | ICD-10-CM | POA: Insufficient documentation

## 2023-10-07 DIAGNOSIS — M25561 Pain in right knee: Secondary | ICD-10-CM | POA: Diagnosis not present

## 2023-10-07 DIAGNOSIS — R2689 Other abnormalities of gait and mobility: Secondary | ICD-10-CM | POA: Diagnosis not present

## 2023-10-07 NOTE — Therapy (Signed)
 OUTPATIENT PHYSICAL THERAPY THORACOLUMBAR EVALUATION   Patient Name: Melissa Gaines MRN: [id number] DOB:03-10-1970, 53 y.o., female Today's Date: 10/07/2023  END OF SESSION:  PT End of Session - 10/07/23 1106     Visit Number 2    Number of Visits 13    Date for PT Re-Evaluation 11/10/23    PT Start Time 1042    PT Stop Time 1120    PT Time Calculation (min) 38 min           Past Medical History:  Diagnosis Date   Anemia    Anxiety    Arthritis    Asthma    Cancer (HCC)    skin cancer   Chronic pain    Complication of anesthesia    Asthma attack during colonoscopy and EGD   Congenital hip dislocation    Depression    GERD (gastroesophageal reflux disease)    Iron deficiency anemia 04/14/2018   Kidney mass 2017   Migraine    Osteoporosis    Pathological dislocation of shoulder joint, bilateral    congential   Pneumonia    as a child   PTSD (post-traumatic stress disorder)    Sleep apnea    Hx. of no CPAP   Systemic lupus erythematosus (HCC)    Tuberculosis    as a child was treated   Urticaria    Past Surgical History:  Procedure Laterality Date   CESAREAN SECTION  2005   CESAREAN SECTION W/BTL  2010   COLONOSCOPY N/A 09/15/2023   Procedure: COLONOSCOPY;  Surgeon: Leigh Elspeth SQUIBB, MD;  Location: [facility];  Service: Gastroenterology;  Laterality: N/A;   ESOPHAGOGASTRODUODENOSCOPY N/A 09/15/2023   Procedure: EGD (ESOPHAGOGASTRODUODENOSCOPY);  Surgeon: Leigh Elspeth SQUIBB, MD;  Location: [facility];  Service: Gastroenterology;  Laterality: N/A;   HIP SURGERY     in childhood for congenital hip dislocation   LAPAROSCOPIC GASTRIC SLEEVE RESECTION  2014   POLYPECTOMY  09/15/2023   Procedure: POLYPECTOMY, INTESTINE;  Surgeon: Leigh Elspeth SQUIBB, MD;  Location: [facility];  Service: Gastroenterology;;   HARLEY DILATION N/A 09/15/2023   Procedure: EGD, WITH DILATION USING SAVARY-GILLIARD DILATOR OVER GUIDEWIRE;  Surgeon: Leigh Elspeth SQUIBB, MD;  Location: [facility];  Service: Gastroenterology;  Laterality: N/A;   STAPEDECTOMY  11/01/2011   L postauricular stapedectomy with CO2 laser and insertion of 6 x 4.75 mm SMart Piston   TOTAL HIP ARTHROPLASTY     05/2000 R, 11/2000 L   Patient Active Problem List   Diagnosis Date Noted   Dysphagia 09/15/2023   Benign neoplasm of colon 09/15/2023   B12 deficiency 05/11/2022   Hyperlipidemia 05/07/2022   Prediabetes 05/07/2022   Environmental and seasonal allergies 08/03/2021   History of colonoscopy 07/31/2021   Asthma 07/01/2021   Gastroesophageal reflux disease 08/19/2020   Tinnitus aurium, 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   Lumbar back pain 01/25/2020   Left hip pain 06/26/2019   Muscle strain of lower extremity, left, initial encounter 06/26/2019   Colon cancer screening 06/26/2019   Iron deficiency anemia 04/14/2018   Hematuria, gross 03/29/2018   Iron deficiency anemia due to chronic blood loss 03/29/2018   Long term current use of anticoagulant 03/29/2018   Antiphospholipid antibody syndrome (HCC) 01/25/2018   Morbid obesity (HCC) 07/26/2017   Systemic lupus erythematosus (HCC) 07/08/2017   Hx pulmonary embolism 07/08/2017   Chronic pain of right knee 05/23/2017   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/21/2016   Vitamin D  deficiency 12/16/2016   Anemia    Migraine  PCP: Alba Sharper, MD  REFERRING PROVIDER: Madelon Donald HERO, DO  REFERRING DIAG: Functional gait abnormality [R26.89], Chronic pain of right knee [M25.561, G89.29], Lumbar back pain [M54.50]   Rationale for Evaluation and Treatment: Rehabilitation  THERAPY DIAG:  Chronic pain of right knee  Other abnormalities of gait and mobility  Muscle weakness (generalized)  PERTINENT HISTORY: Speech impediment, anemia, lupus, tinnitus, astham, prediabetes, vit D deficiency, knee surgery in early 2000s  WEIGHT BEARING RESTRICTIONS: No  FALLS:  Has patient fallen in last 6 months? Yes. Number of falls 5, knee gave  out and she lost her balance, multiple times on the stairs   LIVING ENVIRONMENT: Lives with: lives with their family Lives in: House/apartment Stairs: Yes: External: 12 steps; recently moved to the first floor of complex Has following equipment at home: Single point cane and Walker - 4 wheeled  OCCUPATION: on disability   PRECAUTIONS: None ---------------------------------------------------------------------------------------------  SUBJECTIVE:                                                                                                                                                                                           SUBJECTIVE STATEMENT: Pt attended today's session with reports of 2/10 pain. Pt stated that they have maintained good compliance with current HEP.  No reports of falls   Eval statement 09/29/2023: Has been having pain for many years, pain has slowly gotten worse, recently in past 2 months has fallen 5 times because her R knee has buckled and she lost her balance. Started using a FWW to ambulate and has not had falls since, however has dropped the walker a few times, and does not always use it for short distances. Pain: 9/10 in R knee, feel like an electrocution or numbness, or locking feeling.  RED FLAGS: None    PLOF: Independent  PATIENT GOALS: stop the knee from giving away  NEXT MD VISIT: need to schedule ---------------------------------------------------------------------------------------------  OBJECTIVE:  Note: Objective measures were completed at Evaluation unless otherwise noted.  DIAGNOSTIC FINDINGS:  No pertinent imaging in chart  PATIENT SURVEYS:  LEFS  Extreme difficulty/unable (0), Quite a bit of difficulty (1), Moderate difficulty (2), Little difficulty (3), No difficulty (4) Survey date:  09/29/2023  Any of your usual work, housework or school activities   2. Usual hobbies, recreational or sporting activities   3. Getting into/out  of the bath   4. Walking between rooms   5. Putting on socks/shoes   6. Squatting    7. Lifting an object, like a bag of groceries from the floor   8. Performing light activities around your home   9. Performing  heavy activities around your home   10. Getting into/out of a car   11. Walking 2 blocks   12. Walking 1 mile   13. Going up/down 10 stairs (1 flight)   14. Standing for 1 hour   15.  sitting for 1 hour   16. Running on even ground   17. Running on uneven ground   18. Making sharp turns while running fast   19. Hopping    20. Rolling over in bed   Score total:  7/80     COGNITION: Overall cognitive status: Within functional limits for tasks assessed   PALPATION: Tenderness around R knee  SENSATION: Light touch: Impaired  around RLE  MUSCLE LENGTH: Hamstrings:B WFL  Thomas test: Right Limited R gastroc Limited  POSTURE: rounded shoulders and forward head   LUMBAR ROM:   AROM eval  Flexion   Extension   Right lateral flexion   Left lateral flexion   Right rotation   Left rotation    (Blank rows = not tested)  ! Indicates pain with testing  LOWER EXTREMITY ROM:     Active  Right eval Left eval  Hip flexion    Hip extension    Hip abduction    Hip adduction    Hip internal rotation    Hip external rotation    Knee flexion    Knee extension    Ankle dorsiflexion    Ankle plantarflexion    Ankle inversion    Ankle eversion     (Blank rows = not tested)  ! Indicates pain with testing  LOWER EXTREMITY MMT:    MMT Right eval Left eval  Hip flexion 100 WFL  Hip extension [facility]  Hip abduction    Hip adduction    Hip internal rotation    Hip external rotation    Knee flexion 110 WFL  Knee extension 0 8  Ankle dorsiflexion 10 10  Ankle plantarflexion    Ankle inversion    Ankle eversion     (Blank rows = not tested)   ! Indicates pain with testing LUMBAR SPECIAL TESTS:  Prone instability test: Positive  FUNCTIONAL TESTS:  30  seconds chair stand test 10  GAIT: Distance walked: 125ft Assistive device utilized: Environmental consultant - 4 wheeled Level of assistance: Modified independence Comments: L WS, R trendelenburg, reduced RLE quad recruitment in stance  [facility] Adult PT Treatment:                                                DATE: 10/07/2023 Therapeutic Activity: NuStep 8' for activity tolerance B slant board heel raise 2x15, hold 2s Standing hip 3 way 2x10 B, hold 1s, #2 cuff STS from low surface 2x8, no UE a.   [facility] Adult PT Treatment:                                                DATE: 09/29/2023 Self Care: Pt education Poc discussion  PATIENT EDUCATION:  Education details: Pt received education regarding HEP performance, ADL performance, functional activity tolerance, impairment education, appropriate performance of therapeutic activities. DKE use Person educated: Patient Education method: Explanation, Demonstration, Tactile cues, Verbal cues, and Handouts Education comprehension: verbalized understanding and returned demonstration  HOME EXERCISE PROGRAM: Access Code: [email or web address] URL: [email or web address] Date: 09/29/2023 Prepared by: Mabel Kiang  Exercises - Sit to Stand Without Arm Support  - 1 x daily - 4 x weekly - 2-3 sets - 15 reps - Sidelying Hip Abduction  - 1 x daily - 4 x weekly - 2-3 sets - 10 reps - 3s hold - Supine March  - 1 x daily - 7 x weekly - 2-3 sets - 10 reps - Active Straight Leg Raise with Quad Set  - 1 x daily - 4 x weekly - 2-3 sets - 15 reps - 3s hold ---------------------------------------------------------------------------------------------  ASSESSMENT:  CLINICAL IMPRESSION: Pt attended physical therapy session for continuation of treatment regarding R knee pain. Today's treatment focused on improvement of  Global hip/knee strength  and stability, transfer quality, and activty tolerance. Pt presented today with a reduction in pain since last session and no new reports of falls. Pt showed good tolerance to administered treatment with no adverse effects by the end of session. Skilled intervention was utilized via activity modification for pt tolerance with task completion, functional progression/regression promoting best outcomes inline with current rehab goals, as well as minimal verbal/tactile cuing alongside no physical assistance for safe and appropriate performance of today's activities. Continue to progress as tolerated within current POC focus.   Eval impression (09/29/2023): Pt. attended today's physical therapy session for evaluation of gait and R knee pain. Pt has complaints of R knee pain and buckling causing. Pt has notable deficits and would benefit from therapeutic focus on knee AROM, strength, muscle contraction, muscle length, and gait quality.Treatment performed today focused on pt education detailed in the objective. Pt demonstrated great understanding of education provided. required moderate cues and supervisory assistance for appropriate performance with today's activities. Pt requires the intervention of skilled outpatient physical therapy to address the aforementioned deficits and progress towards a functional level in line with therapeutic goals.    OBJECTIVE IMPAIRMENTS: Abnormal gait, decreased mobility, difficulty walking, decreased ROM, decreased strength, improper body mechanics, postural dysfunction, and pain.   ACTIVITY LIMITATIONS: standing, squatting, sleeping, stairs, dressing, self feeding, and locomotion level  PARTICIPATION LIMITATIONS: meal prep, cleaning, shopping, and community activity  PERSONAL FACTORS: Behavior pattern, Fitness, Time since onset of injury/illness/exacerbation, Transportation, and 3+ comorbidities: see pmh are also affecting patient's functional outcome.   REHAB POTENTIAL:  Fair personal factors  CLINICAL DECISION MAKING: Stable/uncomplicated  EVALUATION COMPLEXITY: Low   GOALS: Goals reviewed with patient? YES  SHORT TERM GOALS: Target date: 10/20/2023  Pt will be independent with administered HEP to demonstrate the competency necessary for long term managemnet of symptoms at home. Baseline: Goal status: INITIAL   LONG TERM GOALS: Target date: 11/10/2023  Pt. Will achieve a LEFS score of 20/80 as to demonstrate improvement in self-perceived functional ability with daily activities.  Baseline:  Goal status: INITIAL  2.  Pt will improve Global hip strength to a 4+/5 to demonstrate improvement in strength for quality of motion and activity performance.  Baseline:  Goal status: INITIAL  3.   Pt will independently ambulate 10 minutes with LRAD and reduced R sided trendelenburg to demonstrate improved weightbearing tolerance, BLE strength, and functional capacity for community ambulation.  Baseline:  Goal status: INITIAL  4.  Pt will improve 30s STS to 12 to demonstrate improved BLE strength, coordination, and functional transfer ability necessary for daily life Baseline: 8 Goal status: INITIAL ---------------------------------------------------------------------------------------------  PLAN:  PT FREQUENCY: 1-2x/week  PT DURATION: 6 weeks  PLANNED INTERVENTIONS: 97110-Therapeutic exercises, 97530- Therapeutic activity, [id number]- Neuromuscular re-education, 97535- Self Care, [id number]- Manual therapy, [phone]- Gait training, [phone]- Aquatic Therapy, Patient/Family education, Balance training, Stair training, Taping, Joint mobilization, and Spinal mobilization.  PLAN FOR NEXT SESSION: Continue to progress as tolerated within current POC focus.   Mabel Kiang, PT, DPT 10/07/2023, 11:20 AM

## 2023-10-11 NOTE — Therapy (Unsigned)
 OUTPATIENT PHYSICAL THERAPY TREATMENT NOTE   Patient Name: Melissa Gaines MRN: [id number] DOB:05/06/1970, 53 y.o., female Today's Date: 10/12/2023  END OF SESSION:  PT End of Session - 10/12/23 1534     Visit Number 3    Number of Visits 13    Date for PT Re-Evaluation 11/10/23    PT Start Time 1530    Activity Tolerance Patient tolerated treatment well    Behavior During Therapy [facility] for tasks assessed/performed            Past Medical History:  Diagnosis Date   Anemia    Anxiety    Arthritis    Asthma    Cancer (HCC)    skin cancer   Chronic pain    Complication of anesthesia    Asthma attack during colonoscopy and EGD   Congenital hip dislocation    Depression    GERD (gastroesophageal reflux disease)    Iron deficiency anemia 04/14/2018   Kidney mass 2017   Migraine    Osteoporosis    Pathological dislocation of shoulder joint, bilateral    congential   Pneumonia    as a child   PTSD (post-traumatic stress disorder)    Sleep apnea    Hx. of no CPAP   Systemic lupus erythematosus (HCC)    Tuberculosis    as a child was treated   Urticaria    Past Surgical History:  Procedure Laterality Date   CESAREAN SECTION  2005   CESAREAN SECTION W/BTL  2010   COLONOSCOPY N/A 09/15/2023   Procedure: COLONOSCOPY;  Surgeon: Leigh Elspeth SQUIBB, MD;  Location: [facility];  Service: Gastroenterology;  Laterality: N/A;   ESOPHAGOGASTRODUODENOSCOPY N/A 09/15/2023   Procedure: EGD (ESOPHAGOGASTRODUODENOSCOPY);  Surgeon: Leigh Elspeth SQUIBB, MD;  Location: [facility];  Service: Gastroenterology;  Laterality: N/A;   HIP SURGERY     in childhood for congenital hip dislocation   LAPAROSCOPIC GASTRIC SLEEVE RESECTION  2014   POLYPECTOMY  09/15/2023   Procedure: POLYPECTOMY, INTESTINE;  Surgeon: Leigh Elspeth SQUIBB, MD;  Location: [facility];  Service: Gastroenterology;;   HARLEY DILATION N/A 09/15/2023   Procedure: EGD, WITH DILATION USING  SAVARY-GILLIARD DILATOR OVER GUIDEWIRE;  Surgeon: Leigh Elspeth SQUIBB, MD;  Location: [facility];  Service: Gastroenterology;  Laterality: N/A;   STAPEDECTOMY  11/01/2011   L postauricular stapedectomy with CO2 laser and insertion of 6 x 4.75 mm SMart Piston   TOTAL HIP ARTHROPLASTY     05/2000 R, 11/2000 L   Patient Active Problem List   Diagnosis Date Noted   Dysphagia 09/15/2023   Benign neoplasm of colon 09/15/2023   B12 deficiency 05/11/2022   Hyperlipidemia 05/07/2022   Prediabetes 05/07/2022   Environmental and seasonal allergies 08/03/2021   History of colonoscopy 07/31/2021   Asthma 07/01/2021   Gastroesophageal reflux disease 08/19/2020   Tinnitus aurium, 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   Lumbar back pain 01/25/2020   Left hip pain 06/26/2019   Muscle strain of lower extremity, left, initial encounter 06/26/2019   Colon cancer screening 06/26/2019   Iron deficiency anemia 04/14/2018   Hematuria, gross 03/29/2018   Iron deficiency anemia due to chronic blood loss 03/29/2018   Long term current use of anticoagulant 03/29/2018   Antiphospholipid antibody syndrome (HCC) 01/25/2018   Morbid obesity (HCC) 07/26/2017   Systemic lupus erythematosus (HCC) 07/08/2017   Hx pulmonary embolism 07/08/2017   Chronic pain of right knee 05/23/2017   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/21/2016   Vitamin D  deficiency 12/16/2016   Anemia  Migraine     PCP: Alba Sharper, MD  REFERRING PROVIDER: Madelon Donald HERO, DO  REFERRING DIAG: Functional gait abnormality [R26.89], Chronic pain of right knee [M25.561, G89.29], Lumbar back pain [M54.50]   Rationale for Evaluation and Treatment: Rehabilitation  THERAPY DIAG:  Chronic pain of right knee  Other abnormalities of gait and mobility  Muscle weakness (generalized)  PERTINENT HISTORY: Speech impediment, anemia, lupus, tinnitus, astham, prediabetes, vit D deficiency, knee surgery in early 2000s  WEIGHT BEARING RESTRICTIONS: No  FALLS:  Has patient  fallen in last 6 months? Yes. Number of falls 5, knee gave out and she lost her balance, multiple times on the stairs   LIVING ENVIRONMENT: Lives with: lives with their family Lives in: House/apartment Stairs: Yes: External: 12 steps; recently moved to the first floor of complex Has following equipment at home: Single point cane and Walker - 4 wheeled  OCCUPATION: on disability   PRECAUTIONS: None ---------------------------------------------------------------------------------------------  SUBJECTIVE:                                                                                                                                                                                           SUBJECTIVE STATEMENT: No new c/o today.  Continues with R sided pain in low back/hip, 8/10   Eval statement 09/29/2023: Has been having pain for many years, pain has slowly gotten worse, recently in past 2 months has fallen 5 times because her R knee has buckled and she lost her balance. Started using a FWW to ambulate and has not had falls since, however has dropped the walker a few times, and does not always use it for short distances. Pain: 9/10 in R knee, feel like an electrocution or numbness, or locking feeling.  RED FLAGS: None    PLOF: Independent  PATIENT GOALS: stop the knee from giving away  NEXT MD VISIT: need to schedule ---------------------------------------------------------------------------------------------  OBJECTIVE:  Note: Objective measures were completed at Evaluation unless otherwise noted.  DIAGNOSTIC FINDINGS:  No pertinent imaging in chart  PATIENT SURVEYS:  LEFS  Extreme difficulty/unable (0), Quite a bit of difficulty (1), Moderate difficulty (2), Little difficulty (3), No difficulty (4) Survey date:  09/29/2023  Any of your usual work, housework or school activities   2. Usual hobbies, recreational or sporting activities   3. Getting into/out of the bath   4.  Walking between rooms   5. Putting on socks/shoes   6. Squatting    7. Lifting an object, like a bag of groceries from the floor   8. Performing light activities around your home   9. Performing heavy activities around your home  10. Getting into/out of a car   11. Walking 2 blocks   12. Walking 1 mile   13. Going up/down 10 stairs (1 flight)   14. Standing for 1 hour   15.  sitting for 1 hour   16. Running on even ground   17. Running on uneven ground   18. Making sharp turns while running fast   19. Hopping    20. Rolling over in bed   Score total:  7/80     COGNITION: Overall cognitive status: Within functional limits for tasks assessed   PALPATION: Tenderness around R knee  SENSATION: Light touch: Impaired  around RLE  MUSCLE LENGTH: Hamstrings:B WFL  Thomas test: Right Limited R gastroc Limited  POSTURE: rounded shoulders and forward head   LUMBAR ROM:   AROM eval  Flexion   Extension   Right lateral flexion   Left lateral flexion   Right rotation   Left rotation    (Blank rows = not tested)  ! Indicates pain with testing  LOWER EXTREMITY ROM:     Active  Right eval Left eval  Hip flexion    Hip extension    Hip abduction    Hip adduction    Hip internal rotation    Hip external rotation    Knee flexion    Knee extension    Ankle dorsiflexion    Ankle plantarflexion    Ankle inversion    Ankle eversion     (Blank rows = not tested)  ! Indicates pain with testing  LOWER EXTREMITY MMT:    MMT Right eval Left eval  Hip flexion 100 WFL  Hip extension [facility]  Hip abduction    Hip adduction    Hip internal rotation    Hip external rotation    Knee flexion 110 WFL  Knee extension 0 8  Ankle dorsiflexion 10 10  Ankle plantarflexion    Ankle inversion    Ankle eversion     (Blank rows = not tested)   ! Indicates pain with testing LUMBAR SPECIAL TESTS:  Prone instability test: Positive  FUNCTIONAL TESTS:  30 seconds chair stand  test 10  GAIT: Distance walked: 146ft Assistive device utilized: Environmental consultant - 4 wheeled Level of assistance: Modified independence Comments: L WS, R trendelenburg, reduced RLE quad recruitment in stance  [facility] Adult PT Treatment:                                                DATE: 10/12/23 Therapeutic Exercise: Nustep L4 8 min seat 6 Neuromuscular re-ed: FAQ with adduction 15x Supine hip fallouts RTB 15x B, 15/15 unilaterally Bridge against RTB 15x S/L clams 15/15 RTB  Therapeutic Activity: Seated hamstring stretch 30s x2 B PF against wall 15x DF against wall 15x STS from airex pad 10x  [facility] Adult PT Treatment:                                                DATE: 10/07/2023 Therapeutic Activity: NuStep 8' for activity tolerance B slant board heel raise 2x15, hold 2s Standing hip 3 way 2x10 B, hold 1s, #2 cuff STS from low surface 2x8, no UE a.   [facility] Adult PT Treatment:  DATE: 09/29/2023 Self Care: Pt education Poc discussion                                                                                                                                PATIENT EDUCATION:  Education details: Pt received education regarding HEP performance, ADL performance, functional activity tolerance, impairment education, appropriate performance of therapeutic activities. DKE use Person educated: Patient Education method: Explanation, Demonstration, Tactile cues, Verbal cues, and Handouts Education comprehension: verbalized understanding and returned demonstration  HOME EXERCISE PROGRAM: Access Code: [email or web address] URL: [email or web address] Date: 09/29/2023 Prepared by: Mabel Kiang  Exercises - Sit to Stand Without Arm Support  - 1 x daily - 4 x weekly - 2-3 sets - 15 reps - Sidelying Hip Abduction  - 1 x daily - 4 x weekly - 2-3 sets - 10 reps - 3s hold - Supine March  - 1 x daily - 7 x weekly - 2-3 sets - 10 reps - Active  Straight Leg Raise with Quad Set  - 1 x daily - 4 x weekly - 2-3 sets - 15 reps - 3s hold ---------------------------------------------------------------------------------------------  ASSESSMENT:  CLINICAL IMPRESSION: Focus of today's session was aerobic w/u followed by proximal hip strengthening and stabilization tasks.  Added stretching activities, CKC LE strengthening, balance and proprioceptive tasks.  Emphasis paced on control and ROM during session.   Eval impression (09/29/2023): Pt. attended today's physical therapy session for evaluation of gait and R knee pain. Pt has complaints of R knee pain and buckling causing. Pt has notable deficits and would benefit from therapeutic focus on knee AROM, strength, muscle contraction, muscle length, and gait quality.Treatment performed today focused on pt education detailed in the objective. Pt demonstrated great understanding of education provided. required moderate cues and supervisory assistance for appropriate performance with today's activities. Pt requires the intervention of skilled outpatient physical therapy to address the aforementioned deficits and progress towards a functional level in line with therapeutic goals.    OBJECTIVE IMPAIRMENTS: Abnormal gait, decreased mobility, difficulty walking, decreased ROM, decreased strength, improper body mechanics, postural dysfunction, and pain.   ACTIVITY LIMITATIONS: standing, squatting, sleeping, stairs, dressing, self feeding, and locomotion level  PARTICIPATION LIMITATIONS: meal prep, cleaning, shopping, and community activity  PERSONAL FACTORS: Behavior pattern, Fitness, Time since onset of injury/illness/exacerbation, Transportation, and 3+ comorbidities: see pmh are also affecting patient's functional outcome.   REHAB POTENTIAL: Fair personal factors  CLINICAL DECISION MAKING: Stable/uncomplicated  EVALUATION COMPLEXITY: Low   GOALS: Goals reviewed with patient? YES  SHORT TERM  GOALS: Target date: 10/20/2023  Pt will be independent with administered HEP to demonstrate the competency necessary for long term managemnet of symptoms at home. Baseline: Goal status: INITIAL   LONG TERM GOALS: Target date: 11/10/2023  Pt. Will achieve a LEFS score of 20/80 as to demonstrate improvement in self-perceived functional ability with daily activities.  Baseline:  Goal status: INITIAL  2.  Pt will improve Global hip strength  to a 4+/5 to demonstrate improvement in strength for quality of motion and activity performance.  Baseline:  Goal status: INITIAL  3.   Pt will independently ambulate 10 minutes with LRAD and reduced R sided trendelenburg to demonstrate improved weightbearing tolerance, BLE strength, and functional capacity for community ambulation.  Baseline:  Goal status: INITIAL  4.  Pt will improve 30s STS to 12 to demonstrate improved BLE strength, coordination, and functional transfer ability necessary for daily life Baseline: 8 Goal status: INITIAL ---------------------------------------------------------------------------------------------  PLAN:  PT FREQUENCY: 1-2x/week  PT DURATION: 6 weeks  PLANNED INTERVENTIONS: 97110-Therapeutic exercises, 97530- Therapeutic activity, [id number]- Neuromuscular re-education, 97535- Self Care, [id number]- Manual therapy, [phone]- Gait training, [phone]- Aquatic Therapy, Patient/Family education, Balance training, Stair training, Taping, Joint mobilization, and Spinal mobilization.  PLAN FOR NEXT SESSION: Continue to progress as tolerated within current POC focus.   Jeff Zuleika Gallus PT  10/12/2023, 3:35 PM

## 2023-10-12 ENCOUNTER — Ambulatory Visit

## 2023-10-12 DIAGNOSIS — M25561 Pain in right knee: Secondary | ICD-10-CM | POA: Diagnosis not present

## 2023-10-12 DIAGNOSIS — G8929 Other chronic pain: Secondary | ICD-10-CM | POA: Diagnosis not present

## 2023-10-12 DIAGNOSIS — M6281 Muscle weakness (generalized): Secondary | ICD-10-CM | POA: Diagnosis not present

## 2023-10-12 DIAGNOSIS — R2689 Other abnormalities of gait and mobility: Secondary | ICD-10-CM | POA: Diagnosis not present

## 2023-10-13 ENCOUNTER — Ambulatory Visit

## 2023-10-13 NOTE — Therapy (Incomplete)
 OUTPATIENT PHYSICAL THERAPY TREATMENT NOTE   Patient Name: Melissa Gaines MRN: [id number] DOB:07-Jan-1971, 53 y.o., female Today's Date: 10/13/2023  END OF SESSION:      Past Medical History:  Diagnosis Date   Anemia    Anxiety    Arthritis    Asthma    Cancer (HCC)    skin cancer   Chronic pain    Complication of anesthesia    Asthma attack during colonoscopy and EGD   Congenital hip dislocation    Depression    GERD (gastroesophageal reflux disease)    Iron deficiency anemia 04/14/2018   Kidney mass 2017   Migraine    Osteoporosis    Pathological dislocation of shoulder joint, bilateral    congential   Pneumonia    as a child   PTSD (post-traumatic stress disorder)    Sleep apnea    Hx. of no CPAP   Systemic lupus erythematosus (HCC)    Tuberculosis    as a child was treated   Urticaria    Past Surgical History:  Procedure Laterality Date   CESAREAN SECTION  2005   CESAREAN SECTION W/BTL  2010   COLONOSCOPY N/A 09/15/2023   Procedure: COLONOSCOPY;  Surgeon: Leigh Elspeth SQUIBB, MD;  Location: [facility];  Service: Gastroenterology;  Laterality: N/A;   ESOPHAGOGASTRODUODENOSCOPY N/A 09/15/2023   Procedure: EGD (ESOPHAGOGASTRODUODENOSCOPY);  Surgeon: Leigh Elspeth SQUIBB, MD;  Location: [facility];  Service: Gastroenterology;  Laterality: N/A;   HIP SURGERY     in childhood for congenital hip dislocation   LAPAROSCOPIC GASTRIC SLEEVE RESECTION  2014   POLYPECTOMY  09/15/2023   Procedure: POLYPECTOMY, INTESTINE;  Surgeon: Leigh Elspeth SQUIBB, MD;  Location: [facility];  Service: Gastroenterology;;   HARLEY DILATION N/A 09/15/2023   Procedure: EGD, WITH DILATION USING SAVARY-GILLIARD DILATOR OVER GUIDEWIRE;  Surgeon: Leigh Elspeth SQUIBB, MD;  Location: [facility];  Service: Gastroenterology;  Laterality: N/A;   STAPEDECTOMY  11/01/2011   L postauricular stapedectomy with CO2 laser and insertion of 6 x 4.75 mm SMart Piston   TOTAL HIP  ARTHROPLASTY     05/2000 R, 11/2000 L   Patient Active Problem List   Diagnosis Date Noted   Dysphagia 09/15/2023   Benign neoplasm of colon 09/15/2023   B12 deficiency 05/11/2022   Hyperlipidemia 05/07/2022   Prediabetes 05/07/2022   Environmental and seasonal allergies 08/03/2021   History of colonoscopy 07/31/2021   Asthma 07/01/2021   Gastroesophageal reflux disease 08/19/2020   Tinnitus aurium, 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   Lumbar back pain 01/25/2020   Left hip pain 06/26/2019   Muscle strain of lower extremity, left, initial encounter 06/26/2019   Colon cancer screening 06/26/2019   Iron deficiency anemia 04/14/2018   Hematuria, gross 03/29/2018   Iron deficiency anemia due to chronic blood loss 03/29/2018   Long term current use of anticoagulant 03/29/2018   Antiphospholipid antibody syndrome (HCC) 01/25/2018   Morbid obesity (HCC) 07/26/2017   Systemic lupus erythematosus (HCC) 07/08/2017   Hx pulmonary embolism 07/08/2017   Chronic pain of right knee 05/23/2017   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/21/2016   Vitamin D  deficiency 12/16/2016   Anemia    Migraine     PCP: Alba Sharper, MD  REFERRING PROVIDER: Madelon Donald HERO, DO  REFERRING DIAG: Functional gait abnormality [R26.89], Chronic pain of right knee [M25.561, G89.29], Lumbar back pain [M54.50]   Rationale for Evaluation and Treatment: Rehabilitation  THERAPY DIAG:  No diagnosis found.  PERTINENT HISTORY: Speech impediment, anemia, lupus, tinnitus, astham, prediabetes, vit  D deficiency, knee surgery in early 2000s  WEIGHT BEARING RESTRICTIONS: No  FALLS:  Has patient fallen in last 6 months? Yes. Number of falls 5, knee gave out and she lost her balance, multiple times on the stairs   LIVING ENVIRONMENT: Lives with: lives with their family Lives in: House/apartment Stairs: Yes: External: 12 steps; recently moved to the first floor of complex Has following equipment at home: Single point cane and Walker - 4  wheeled  OCCUPATION: on disability   PRECAUTIONS: None ---------------------------------------------------------------------------------------------  SUBJECTIVE:                                                                                                                                                                                           SUBJECTIVE STATEMENT:  ***  No new c/o today.  Continues with R sided pain in low back/hip, 8/10   Eval statement 09/29/2023: Has been having pain for many years, pain has slowly gotten worse, recently in past 2 months has fallen 5 times because her R knee has buckled and she lost her balance. Started using a FWW to ambulate and has not had falls since, however has dropped the walker a few times, and does not always use it for short distances. Pain: 9/10 in R knee, feel like an electrocution or numbness, or locking feeling.  RED FLAGS: None    PLOF: Independent  PATIENT GOALS: stop the knee from giving away  NEXT MD VISIT: need to schedule ---------------------------------------------------------------------------------------------  OBJECTIVE:  Note: Objective measures were completed at Evaluation unless otherwise noted.  DIAGNOSTIC FINDINGS:  No pertinent imaging in chart  PATIENT SURVEYS:  LEFS  Extreme difficulty/unable (0), Quite a bit of difficulty (1), Moderate difficulty (2), Little difficulty (3), No difficulty (4) Survey date:  09/29/2023  Any of your usual work, housework or school activities   2. Usual hobbies, recreational or sporting activities   3. Getting into/out of the bath   4. Walking between rooms   5. Putting on socks/shoes   6. Squatting    7. Lifting an object, like a bag of groceries from the floor   8. Performing light activities around your home   9. Performing heavy activities around your home   10. Getting into/out of a car   11. Walking 2 blocks   12. Walking 1 mile   13. Going up/down 10 stairs  (1 flight)   14. Standing for 1 hour   15.  sitting for 1 hour   16. Running on even ground   17. Running on uneven ground   18. Making sharp turns while running fast   19. Hopping  20. Rolling over in bed   Score total:  7/80     COGNITION: Overall cognitive status: Within functional limits for tasks assessed   PALPATION: Tenderness around R knee  SENSATION: Light touch: Impaired  around RLE  MUSCLE LENGTH: Hamstrings:B WFL  Thomas test: Right Limited R gastroc Limited  POSTURE: rounded shoulders and forward head   LUMBAR ROM:   AROM eval  Flexion   Extension   Right lateral flexion   Left lateral flexion   Right rotation   Left rotation    (Blank rows = not tested)  ! Indicates pain with testing  LOWER EXTREMITY ROM:     Active  Right eval Left eval  Hip flexion    Hip extension    Hip abduction    Hip adduction    Hip internal rotation    Hip external rotation    Knee flexion    Knee extension    Ankle dorsiflexion    Ankle plantarflexion    Ankle inversion    Ankle eversion     (Blank rows = not tested)  ! Indicates pain with testing  LOWER EXTREMITY MMT:    MMT Right eval Left eval  Hip flexion 100 WFL  Hip extension [facility]  Hip abduction    Hip adduction    Hip internal rotation    Hip external rotation    Knee flexion 110 WFL  Knee extension 0 8  Ankle dorsiflexion 10 10  Ankle plantarflexion    Ankle inversion    Ankle eversion     (Blank rows = not tested)   ! Indicates pain with testing LUMBAR SPECIAL TESTS:  Prone instability test: Positive  FUNCTIONAL TESTS:  30 seconds chair stand test 10  GAIT: Distance walked: 172ft Assistive device utilized: Walker - 4 wheeled Level of assistance: Modified independence Comments: L WS, R trendelenburg, reduced RLE quad recruitment in stance  [facility] Adult PT Treatment:                                                DATE: 10/13/23 Aquatic therapy at [facility]- [address]  - therapeutic pool temp approximately *** degrees. Pt enters building ***. Treatment took place in water 3.8 to  4 ft 8 in. deep depending upon activity.  Pt entered and exited the pool via stair and handrails ***. Patient entered water for aquatic therapy for first time and was introduced to principles and therapeutic effects of water as they ambulated and acclimated to pool.  Aquatic Exercise: Walking forward/backwards/side stepping Lunge stepping forwards x2 laps Side stepping rainbow DB shoulder abd/add x2 laps Runners stretch on bottom step x30 BIL Hamstring stretch on bottom step x30 BIL Figure 4 squat stretch, BIL UE support 2x30 BIL Standing thoracic rotation with noodle x1' STS from 3rd step from bottom x10 Step ups bottom step fwd/lat x10 ea BIL Standing with UE support edge of pool: Hip abd/add x20 BIL Hamstring curl x20 BIL Squats 2x20 Heel/toe raises 2x20 Hip ext/flex with knee straight x 20 BIL Hip Circles CW/CCW x10 each BIL Marching hip flexion to knee extension 2x10 BIL Sitting on submerged bench: Alternating LAQ x1' Bicycle kicks x1' Scissor kicks x1' Kickboard push/pull x1' Kickboard push downs x1'  Pt requires the buoyancy of water for active assisted exercises with buoyancy supported for strengthening and AROM exercises. Hydrostatic  pressure also supports joints by unweighting joint load by at least 50 % in 3-4 feet depth water. 80% in chest to neck deep water. Water will provide assistance with movement using the current and laminar flow while the buoyancy reduces weight bearing. Pt requires the viscosity of the water for resistance with strengthening exercises.   [facility] Adult PT Treatment:                                                DATE: 10/12/23 Therapeutic Exercise: Nustep L4 8 min seat 6 Neuromuscular re-ed: FAQ with adduction 15x Supine hip fallouts RTB 15x B, 15/15 unilaterally Bridge against RTB 15x S/L clams 15/15 RTB  Therapeutic  Activity: Seated hamstring stretch 30s x2 B PF against wall 15x DF against wall 15x STS from airex pad 10x  [facility] Adult PT Treatment:                                                DATE: 10/07/2023 Therapeutic Activity: NuStep 8' for activity tolerance B slant board heel raise 2x15, hold 2s Standing hip 3 way 2x10 B, hold 1s, #2 cuff STS from low surface 2x8, no UE a.  PATIENT EDUCATION:  Education details: Pt received education regarding HEP performance, ADL performance, functional activity tolerance, impairment education, appropriate performance of therapeutic activities. DKE use Person educated: Patient Education method: Explanation, Demonstration, Tactile cues, Verbal cues, and Handouts Education comprehension: verbalized understanding and returned demonstration  HOME EXERCISE PROGRAM: Access Code: [email or web address] URL: [email or web address] Date: 09/29/2023 Prepared by: Mabel Kiang  Exercises - Sit to Stand Without Arm Support  - 1 x daily - 4 x weekly - 2-3 sets - 15 reps - Sidelying Hip Abduction  - 1 x daily - 4 x weekly - 2-3 sets - 10 reps - 3s hold - Supine March  - 1 x daily - 7 x weekly - 2-3 sets - 10 reps - Active Straight Leg Raise with Quad Set  - 1 x daily - 4 x weekly - 2-3 sets - 15 reps - 3s hold ---------------------------------------------------------------------------------------------  ASSESSMENT:  CLINICAL IMPRESSION:  ***  Focus of today's session was aerobic w/u followed by proximal hip strengthening and stabilization tasks.  Added stretching activities, CKC LE strengthening, balance and proprioceptive tasks.  Emphasis paced on control and ROM during session.   Eval impression (09/29/2023): Pt. attended today's physical therapy session for evaluation of gait and R knee pain. Pt has complaints of R knee pain and buckling causing. Pt has notable deficits and would benefit from therapeutic focus on knee AROM, strength, muscle contraction,  muscle length, and gait quality.Treatment performed today focused on pt education detailed in the objective. Pt demonstrated great understanding of education provided. required moderate cues and supervisory assistance for appropriate performance with today's activities. Pt requires the intervention of skilled outpatient physical therapy to address the aforementioned deficits and progress towards a functional level in line with therapeutic goals.    OBJECTIVE IMPAIRMENTS: Abnormal gait, decreased mobility, difficulty walking, decreased ROM, decreased strength, improper body mechanics, postural dysfunction, and pain.   ACTIVITY LIMITATIONS: standing, squatting, sleeping, stairs, dressing, self feeding, and locomotion level  PARTICIPATION LIMITATIONS: meal prep, cleaning, shopping, and community activity  PERSONAL FACTORS: Behavior  pattern, Fitness, Time since onset of injury/illness/exacerbation, Transportation, and 3+ comorbidities: see pmh are also affecting patient's functional outcome.   REHAB POTENTIAL: Fair personal factors  CLINICAL DECISION MAKING: Stable/uncomplicated  EVALUATION COMPLEXITY: Low   GOALS: Goals reviewed with patient? YES  SHORT TERM GOALS: Target date: 10/20/2023  Pt will be independent with administered HEP to demonstrate the competency necessary for long term managemnet of symptoms at home. Baseline: Goal status: INITIAL   LONG TERM GOALS: Target date: 11/10/2023  Pt. Will achieve a LEFS score of 20/80 as to demonstrate improvement in self-perceived functional ability with daily activities.  Baseline:  Goal status: INITIAL  2.  Pt will improve Global hip strength to a 4+/5 to demonstrate improvement in strength for quality of motion and activity performance.  Baseline:  Goal status: INITIAL  3.   Pt will independently ambulate 10 minutes with LRAD and reduced R sided trendelenburg to demonstrate improved weightbearing tolerance, BLE strength, and  functional capacity for community ambulation.  Baseline:  Goal status: INITIAL  4.  Pt will improve 30s STS to 12 to demonstrate improved BLE strength, coordination, and functional transfer ability necessary for daily life Baseline: 8 Goal status: INITIAL ---------------------------------------------------------------------------------------------  PLAN:  PT FREQUENCY: 1-2x/week  PT DURATION: 6 weeks  PLANNED INTERVENTIONS: 97110-Therapeutic exercises, 97530- Therapeutic activity, [id number]- Neuromuscular re-education, 97535- Self Care, [id number]- Manual therapy, [phone]- Gait training, [phone]- Aquatic Therapy, Patient/Family education, Balance training, Stair training, Taping, Joint mobilization, and Spinal mobilization.  PLAN FOR NEXT SESSION: Continue to progress as tolerated within current POC focus.   Corean Pouch PTA  10/13/2023, 7:40 AM

## 2023-10-14 ENCOUNTER — Telehealth: Payer: Self-pay | Admitting: Family Medicine

## 2023-10-14 NOTE — Telephone Encounter (Signed)
 Patient has a Annual Care Visit gap per [facility]. Called patient to schedule an annual physical with their PCP.  Schedule for 10/17/2023 at 3:10PM.

## 2023-10-17 ENCOUNTER — Ambulatory Visit: Admitting: Family Medicine

## 2023-10-17 VITALS — BP 106/58 | HR 68 | Wt 186.8 lb

## 2023-10-17 DIAGNOSIS — Z23 Encounter for immunization: Secondary | ICD-10-CM

## 2023-10-17 DIAGNOSIS — E785 Hyperlipidemia, unspecified: Secondary | ICD-10-CM | POA: Diagnosis not present

## 2023-10-17 DIAGNOSIS — R2689 Other abnormalities of gait and mobility: Secondary | ICD-10-CM | POA: Diagnosis not present

## 2023-10-17 MED ORDER — SHINGRIX 50 MCG/0.5ML IM SUSR: INTRAMUSCULAR | 1 refills | Status: DC

## 2023-10-17 NOTE — Progress Notes (Signed)
    SUBJECTIVE:   CHIEF COMPLAINT / HPI: PCS services  Needs more hours from [facility] services. Currently aid comes Mon-Sat 2-3 hours per day. Is limited in some ADLs.  Unable to put on shoes. Cannot do own laundry. Unable to get groceries on her own. Uses 4 wheel walker.  Hyperlipidemia -Started taking Zetia  -Stopped atorvastatin  -Discussed she needs to take both  Needs Prevnar, hep B, shingles vaccine.  PERTINENT  PMH / PSH: Migraine, Asthma, GERD, Antiphospholipid syndrome, Lupus  OBJECTIVE:   BP (!) 106/58   Pulse 68   Wt 186 lb 12.8 oz (84.7 kg)   SpO2 99%   BMI 40.42 kg/m   General: NAD Neuro: A&O Cardiovascular: RRR, no murmurs,  Respiratory: normal WOB on RA, CTAB, no wheezes, ronchi or rales Extremities: Moving all 4 extremities equally, no peripheral edema Gait: antalgic gait without walker, improved with walker   ASSESSMENT/PLAN:   Assessment & Plan Hyperlipidemia, unspecified hyperlipidemia type Previous LDL elevated to 154.  Zetia  was started at that time.  Patient then stopped taking atorvastatin .  Discussed that she needs to take both medicines.  Patient agreeable to plan.  Check lipid panel in 1 month. Functional gait abnormality Secondary to chronic lumbar and knee pain in the setting of chronic osteoarthritis.  Has significantly benefited from personal care services and four-wheel walker.  May require more time based on discussion today.  Instructed to drop off paperwork. Need for vaccination Received hepatitis B and pneumococcal vaccine today.  Prescription for singles vaccine printed and provided for [facility].  Return in about 1 month (around 11/17/2023).  Ozell Provencal, MD [facility]

## 2023-10-17 NOTE — Assessment & Plan Note (Signed)
 Previous LDL elevated to 154.  Zetia  was started at that time.  Patient then stopped taking atorvastatin .  Discussed that she needs to take both medicines.  Patient agreeable to plan.  Check lipid panel in 1 month.

## 2023-10-17 NOTE — Patient Instructions (Addendum)
 It was great to see you! Thank you for allowing me to participate in your care!  Our plans for today:  - I have provided a prescription for the shingles vaccine.  You can [facility] any [facility] to have your first shot done.  You will need another 8 weeks after the first one. - You received the hepatitis B and the pneumonia vaccine today. - Please schedule an appointment 1 month from now and bring in all of your medications at visit.   Please arrive 15 minutes PRIOR to your next scheduled appointment time! If you do not, this affects OTHER patients' care.  Take care and seek immediate care sooner if you develop any concerns.   Ozell Provencal, MD, PGY-3 [facility] 3:19 PM 10/17/2023  [facility]

## 2023-10-18 ENCOUNTER — Ambulatory Visit: Admitting: Physical Therapy

## 2023-10-18 ENCOUNTER — Encounter: Payer: Self-pay | Admitting: Physical Therapy

## 2023-10-18 DIAGNOSIS — G8929 Other chronic pain: Secondary | ICD-10-CM

## 2023-10-18 DIAGNOSIS — R2689 Other abnormalities of gait and mobility: Secondary | ICD-10-CM

## 2023-10-18 DIAGNOSIS — M6281 Muscle weakness (generalized): Secondary | ICD-10-CM

## 2023-10-18 DIAGNOSIS — M25561 Pain in right knee: Secondary | ICD-10-CM | POA: Diagnosis not present

## 2023-10-18 NOTE — Therapy (Signed)
 OUTPATIENT PHYSICAL THERAPY TREATMENT NOTE   Patient Name: Melissa Gaines MRN: [id number] DOB:[birth year], 53 y.o., female Today's Date: 10/18/2023  END OF SESSION:  PT End of Session - 10/18/23 1158     Visit Number 4    Number of Visits 13    Date for PT Re-Evaluation 11/10/23    PT Start Time 1130    PT Stop Time 1208    PT Time Calculation (min) 38 min    Activity Tolerance Patient tolerated treatment well    Behavior During Therapy [facility] for tasks assessed/performed             Past Medical History:  Diagnosis Date   Anemia    Anxiety    Arthritis    Asthma    Cancer (HCC)    skin cancer   Chronic pain    Complication of anesthesia    Asthma attack during colonoscopy and EGD   Congenital hip dislocation    Depression    GERD (gastroesophageal reflux disease)    Iron deficiency anemia 04/14/2018   Kidney mass 2017   Migraine    Osteoporosis    Pathological dislocation of shoulder joint, bilateral    congential   Pneumonia    as a child   PTSD (post-traumatic stress disorder)    Sleep apnea    Hx. of no CPAP   Systemic lupus erythematosus (HCC)    Tuberculosis    as a child was treated   Urticaria    Past Surgical History:  Procedure Laterality Date   CESAREAN SECTION  2005   CESAREAN SECTION W/BTL  2010   COLONOSCOPY N/A 09/15/2023   Procedure: COLONOSCOPY;  Surgeon: Leigh Elspeth SQUIBB, MD;  Location: [facility];  Service: Gastroenterology;  Laterality: N/A;   ESOPHAGOGASTRODUODENOSCOPY N/A 09/15/2023   Procedure: EGD (ESOPHAGOGASTRODUODENOSCOPY);  Surgeon: Leigh Elspeth SQUIBB, MD;  Location: [facility];  Service: Gastroenterology;  Laterality: N/A;   HIP SURGERY     in childhood for congenital hip dislocation   LAPAROSCOPIC GASTRIC SLEEVE RESECTION  2014   POLYPECTOMY  09/15/2023   Procedure: POLYPECTOMY, INTESTINE;  Surgeon: Leigh Elspeth SQUIBB, MD;  Location: [facility];  Service: Gastroenterology;;   HARLEY DILATION N/A  09/15/2023   Procedure: EGD, WITH DILATION USING SAVARY-GILLIARD DILATOR OVER GUIDEWIRE;  Surgeon: Leigh Elspeth SQUIBB, MD;  Location: [facility];  Service: Gastroenterology;  Laterality: N/A;   STAPEDECTOMY  11/01/2011   L postauricular stapedectomy with CO2 laser and insertion of 6 x 4.75 mm SMart Piston   TOTAL HIP ARTHROPLASTY     05/2000 R, 11/2000 L   Patient Active Problem List   Diagnosis Date Noted   Dysphagia 09/15/2023   Benign neoplasm of colon 09/15/2023   B12 deficiency 05/11/2022   Hyperlipidemia 05/07/2022   Prediabetes 05/07/2022   Environmental and seasonal allergies 08/03/2021   History of colonoscopy 07/31/2021   Asthma 07/01/2021   Gastroesophageal reflux disease 08/19/2020   Tinnitus aurium, 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   Lumbar back pain 01/25/2020   Left hip pain 06/26/2019   Muscle strain of lower extremity, left, initial encounter 06/26/2019   Colon cancer screening 06/26/2019   Iron deficiency anemia 04/14/2018   Hematuria, gross 03/29/2018   Iron deficiency anemia due to chronic blood loss 03/29/2018   Long term current use of anticoagulant 03/29/2018   Antiphospholipid antibody syndrome (HCC) 01/25/2018   Morbid obesity (HCC) 07/26/2017   Systemic lupus erythematosus (HCC) 07/08/2017   Hx pulmonary embolism 07/08/2017   Chronic pain of right  knee 05/23/2017   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/21/2016   Vitamin D  deficiency 12/16/2016   Anemia    Migraine     PCP: Alba Sharper, MD  REFERRING PROVIDER: Madelon Donald HERO, DO  REFERRING DIAG: Functional gait abnormality [R26.89], Chronic pain of right knee [M25.561, G89.29], Lumbar back pain [M54.50]   Rationale for Evaluation and Treatment: Rehabilitation  THERAPY DIAG:  Chronic pain of right knee  Other abnormalities of gait and mobility  Muscle weakness (generalized)  PERTINENT HISTORY: Speech impediment, anemia, lupus, tinnitus, astham, prediabetes, vit D deficiency, knee surgery in early 2000s  WEIGHT  BEARING RESTRICTIONS: No  FALLS:  Has patient fallen in last 6 months? Yes. Number of falls 5, knee gave out and she lost her balance, multiple times on the stairs   LIVING ENVIRONMENT: Lives with: lives with their family Lives in: House/apartment Stairs: Yes: External: 12 steps; recently moved to the first floor of complex Has following equipment at home: Single point cane and Walker - 4 wheeled  OCCUPATION: on disability   PRECAUTIONS: None ---------------------------------------------------------------------------------------------  SUBJECTIVE:                                                                                                                                                                                           SUBJECTIVE STATEMENT: No new c/o today.  Continues with R sided pain in low back/hip, 8/10   Eval statement 09/29/2023: Has been having pain for many years, pain has slowly gotten worse, recently in past 2 months has fallen 5 times because her R knee has buckled and she lost her balance. Started using a FWW to ambulate and has not had falls since, however has dropped the walker a few times, and does not always use it for short distances. Pain: 9/10 in R knee, feel like an electrocution or numbness, or locking feeling.  RED FLAGS: None    PLOF: Independent  PATIENT GOALS: stop the knee from giving away  NEXT MD VISIT: need to schedule ---------------------------------------------------------------------------------------------  OBJECTIVE:  Note: Objective measures were completed at Evaluation unless otherwise noted.  DIAGNOSTIC FINDINGS:  No pertinent imaging in chart  PATIENT SURVEYS:  LEFS  Extreme difficulty/unable (0), Quite a bit of difficulty (1), Moderate difficulty (2), Little difficulty (3), No difficulty (4) Survey date:  09/29/2023  Any of your usual work, housework or school activities   2. Usual hobbies, recreational or sporting  activities   3. Getting into/out of the bath   4. Walking between rooms   5. Putting on socks/shoes   6. Squatting    7. Lifting an object, like a bag of groceries from the floor  8. Performing light activities around your home   9. Performing heavy activities around your home   10. Getting into/out of a car   11. Walking 2 blocks   12. Walking 1 mile   13. Going up/down 10 stairs (1 flight)   14. Standing for 1 hour   15.  sitting for 1 hour   16. Running on even ground   17. Running on uneven ground   18. Making sharp turns while running fast   19. Hopping    20. Rolling over in bed   Score total:  7/80     COGNITION: Overall cognitive status: Within functional limits for tasks assessed   PALPATION: Tenderness around R knee  SENSATION: Light touch: Impaired  around RLE  MUSCLE LENGTH: Hamstrings:B WFL  Thomas test: Right Limited R gastroc Limited  POSTURE: rounded shoulders and forward head   LUMBAR ROM:   AROM eval  Flexion   Extension   Right lateral flexion   Left lateral flexion   Right rotation   Left rotation    (Blank rows = not tested)  ! Indicates pain with testing  LOWER EXTREMITY ROM:     Active  Right eval Left eval  Hip flexion    Hip extension    Hip abduction    Hip adduction    Hip internal rotation    Hip external rotation    Knee flexion    Knee extension    Ankle dorsiflexion    Ankle plantarflexion    Ankle inversion    Ankle eversion     (Blank rows = not tested)  ! Indicates pain with testing  LOWER EXTREMITY MMT:    MMT Right eval Left eval  Hip flexion 100 WFL  Hip extension [facility]  Hip abduction    Hip adduction    Hip internal rotation    Hip external rotation    Knee flexion 110 WFL  Knee extension 0 8  Ankle dorsiflexion 10 10  Ankle plantarflexion    Ankle inversion    Ankle eversion     (Blank rows = not tested)   ! Indicates pain with testing LUMBAR SPECIAL TESTS:  Prone instability test:  Positive  FUNCTIONAL TESTS:  30 seconds chair stand test 10  GAIT: Distance walked: 122ft Assistive device utilized: Walker - 4 wheeled Level of assistance: Modified independence Comments: L WS, R trendelenburg, reduced RLE quad recruitment in stance  [facility] Adult PT Treatment:                                                DATE: 10/18/2023  Therapeutic Exercise: Gastroc stretch on slant board 2x1' Runners step on 4 box 2x8 B, UE a. PRN Therapeutic Activity: NuStep 8' for activity tolerance Resisted marching 2x12B, hold 1s, RTB Slant board heel raise  2x15, hold 3s TKE on RLE w/ball at wall 2x10, hold 8s  [facility] Adult PT Treatment:                                                DATE: 10/12/23 Therapeutic Exercise: Nustep L4 8 min seat 6 Neuromuscular re-ed: FAQ with adduction 15x Supine hip fallouts RTB 15x B, 15/15 unilaterally Bridge against  RTB 15x S/L clams 15/15 RTB  Therapeutic Activity: Seated hamstring stretch 30s x2 B PF against wall 15x DF against wall 15x STS from airex pad 10x  [facility] Adult PT Treatment:                                                DATE: 10/07/2023 Therapeutic Activity: NuStep 8' for activity tolerance B slant board heel raise 2x15, hold 2s Standing hip 3 way 2x10 B, hold 1s, #2 cuff STS from low surface 2x8, no UE a.   [facility] Adult PT Treatment:                                                DATE: 09/29/2023 Self Care: Pt education Poc discussion                                                                                                                                PATIENT EDUCATION:  Education details: Pt received education regarding HEP performance, ADL performance, functional activity tolerance, impairment education, appropriate performance of therapeutic activities. DKE use Person educated: Patient Education method: Explanation, Demonstration, Tactile cues, Verbal cues, and Handouts Education comprehension: verbalized  understanding and returned demonstration  HOME EXERCISE PROGRAM: Access Code: [email or web address] URL: [email or web address] Date: 09/29/2023 Prepared by: Mabel Kiang  Exercises - Sit to Stand Without Arm Support  - 1 x daily - 4 x weekly - 2-3 sets - 15 reps - Sidelying Hip Abduction  - 1 x daily - 4 x weekly - 2-3 sets - 10 reps - 3s hold - Supine March  - 1 x daily - 7 x weekly - 2-3 sets - 10 reps - Active Straight Leg Raise with Quad Set  - 1 x daily - 4 x weekly - 2-3 sets - 15 reps - 3s hold ---------------------------------------------------------------------------------------------  ASSESSMENT:  CLINICAL IMPRESSION:  Pt attended physical therapy session for continuation of treatment regarding gait and R knee pain. Today's treatment focused on improvement of  BLE strengthening, mobility and activity tolerance. Pt reports being able to walk around confidently without rollator at home and supermarket.  Pt showed great  tolerance to administered treatment with no adverse effects by the end of session. Skilled intervention was utilized via activity modification for pt tolerance with task completion, functional progression/regression promoting best outcomes inline with current rehab goals, as well as moderate verbal/tactile cuing alongside no physical assistance for safe and appropriate performance of today's activities. Continue to stress global strengthening and gait quality with LRAD.  Eval impression (09/29/2023): Pt. attended today's physical therapy session for evaluation of gait and R knee pain. Pt has complaints of R knee pain and buckling causing.  Pt has notable deficits and would benefit from therapeutic focus on knee AROM, strength, muscle contraction, muscle length, and gait quality.Treatment performed today focused on pt education detailed in the objective. Pt demonstrated great understanding of education provided. required moderate cues and supervisory assistance for  appropriate performance with today's activities. Pt requires the intervention of skilled outpatient physical therapy to address the aforementioned deficits and progress towards a functional level in line with therapeutic goals.    OBJECTIVE IMPAIRMENTS: Abnormal gait, decreased mobility, difficulty walking, decreased ROM, decreased strength, improper body mechanics, postural dysfunction, and pain.   ACTIVITY LIMITATIONS: standing, squatting, sleeping, stairs, dressing, self feeding, and locomotion level  PARTICIPATION LIMITATIONS: meal prep, cleaning, shopping, and community activity  PERSONAL FACTORS: Behavior pattern, Fitness, Time since onset of injury/illness/exacerbation, Transportation, and 3+ comorbidities: see pmh are also affecting patient's functional outcome.   REHAB POTENTIAL: Fair personal factors  CLINICAL DECISION MAKING: Stable/uncomplicated  EVALUATION COMPLEXITY: Low   GOALS: Goals reviewed with patient? YES  SHORT TERM GOALS: Target date: 10/20/2023  Pt will be independent with administered HEP to demonstrate the competency necessary for long term managemnet of symptoms at home. Baseline: Goal status: INITIAL   LONG TERM GOALS: Target date: 11/10/2023  Pt. Will achieve a LEFS score of 20/80 as to demonstrate improvement in self-perceived functional ability with daily activities.  Baseline:  Goal status: INITIAL  2.  Pt will improve Global hip strength to a 4+/5 to demonstrate improvement in strength for quality of motion and activity performance.  Baseline:  Goal status: INITIAL  3.   Pt will independently ambulate 10 minutes with LRAD and reduced R sided trendelenburg to demonstrate improved weightbearing tolerance, BLE strength, and functional capacity for community ambulation.  Baseline:  Goal status: INITIAL  4.  Pt will improve 30s STS to 12 to demonstrate improved BLE strength, coordination, and functional transfer ability necessary for daily  life Baseline: 8 Goal status: INITIAL ---------------------------------------------------------------------------------------------  PLAN:  PT FREQUENCY: 1-2x/week  PT DURATION: 6 weeks  PLANNED INTERVENTIONS: 97110-Therapeutic exercises, 97530- Therapeutic activity, [id number]- Neuromuscular re-education, 97535- Self Care, [id number]- Manual therapy, [phone]- Gait training, [phone]- Aquatic Therapy, Patient/Family education, Balance training, Stair training, Taping, Joint mobilization, and Spinal mobilization.  PLAN FOR NEXT SESSION: Continue to progress as tolerated within current POC focus.   Mabel Kiang, PT, DPT 10/18/2023, 12:10 PM

## 2023-10-20 ENCOUNTER — Ambulatory Visit

## 2023-10-20 NOTE — Therapy (Incomplete)
 OUTPATIENT PHYSICAL THERAPY TREATMENT NOTE   Patient Name: Melissa Gaines MRN: [id number] DOB:[birth year], 53 y.o., female Today's Date: 10/20/2023  END OF SESSION:       Past Medical History:  Diagnosis Date   Anemia    Anxiety    Arthritis    Asthma    Cancer (HCC)    skin cancer   Chronic pain    Complication of anesthesia    Asthma attack during colonoscopy and EGD   Congenital hip dislocation    Depression    GERD (gastroesophageal reflux disease)    Iron deficiency anemia 04/14/2018   Kidney mass 2017   Migraine    Osteoporosis    Pathological dislocation of shoulder joint, bilateral    congential   Pneumonia    as a child   PTSD (post-traumatic stress disorder)    Sleep apnea    Hx. of no CPAP   Systemic lupus erythematosus (HCC)    Tuberculosis    as a child was treated   Urticaria    Past Surgical History:  Procedure Laterality Date   CESAREAN SECTION  2005   CESAREAN SECTION W/BTL  2010   COLONOSCOPY N/A 09/15/2023   Procedure: COLONOSCOPY;  Surgeon: Leigh Elspeth SQUIBB, MD;  Location: [facility];  Service: Gastroenterology;  Laterality: N/A;   ESOPHAGOGASTRODUODENOSCOPY N/A 09/15/2023   Procedure: EGD (ESOPHAGOGASTRODUODENOSCOPY);  Surgeon: Leigh Elspeth SQUIBB, MD;  Location: [facility];  Service: Gastroenterology;  Laterality: N/A;   HIP SURGERY     in childhood for congenital hip dislocation   LAPAROSCOPIC GASTRIC SLEEVE RESECTION  2014   POLYPECTOMY  09/15/2023   Procedure: POLYPECTOMY, INTESTINE;  Surgeon: Leigh Elspeth SQUIBB, MD;  Location: [facility];  Service: Gastroenterology;;   HARLEY DILATION N/A 09/15/2023   Procedure: EGD, WITH DILATION USING SAVARY-GILLIARD DILATOR OVER GUIDEWIRE;  Surgeon: Leigh Elspeth SQUIBB, MD;  Location: [facility];  Service: Gastroenterology;  Laterality: N/A;   STAPEDECTOMY  11/01/2011   L postauricular stapedectomy with CO2 laser and insertion of 6 x 4.75 mm SMart Piston   TOTAL HIP  ARTHROPLASTY     05/2000 R, 11/2000 L   Patient Active Problem List   Diagnosis Date Noted   Dysphagia 09/15/2023   Benign neoplasm of colon 09/15/2023   B12 deficiency 05/11/2022   Hyperlipidemia 05/07/2022   Prediabetes 05/07/2022   Environmental and seasonal allergies 08/03/2021   History of colonoscopy 07/31/2021   Asthma 07/01/2021   Gastroesophageal reflux disease 08/19/2020   Tinnitus aurium, 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   Lumbar back pain 01/25/2020   Left hip pain 06/26/2019   Muscle strain of lower extremity, left, initial encounter 06/26/2019   Colon cancer screening 06/26/2019   Iron deficiency anemia 04/14/2018   Hematuria, gross 03/29/2018   Iron deficiency anemia due to chronic blood loss 03/29/2018   Long term current use of anticoagulant 03/29/2018   Antiphospholipid antibody syndrome (HCC) 01/25/2018   Morbid obesity (HCC) 07/26/2017   Systemic lupus erythematosus (HCC) 07/08/2017   Hx pulmonary embolism 07/08/2017   Chronic pain of right knee 05/23/2017   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/21/2016   Vitamin D  deficiency 12/16/2016   Anemia    Migraine     PCP: Alba Sharper, MD  REFERRING PROVIDER: Madelon Donald HERO, DO  REFERRING DIAG: Functional gait abnormality [R26.89], Chronic pain of right knee [M25.561, G89.29], Lumbar back pain [M54.50]   Rationale for Evaluation and Treatment: Rehabilitation  THERAPY DIAG:  No diagnosis found.  PERTINENT HISTORY: Speech impediment, anemia, lupus, tinnitus, astham, prediabetes,  vit D deficiency, knee surgery in early 2000s  WEIGHT BEARING RESTRICTIONS: No  FALLS:  Has patient fallen in last 6 months? Yes. Number of falls 5, knee gave out and she lost her balance, multiple times on the stairs   LIVING ENVIRONMENT: Lives with: lives with their family Lives in: House/apartment Stairs: Yes: External: 12 steps; recently moved to the first floor of complex Has following equipment at home: Single point cane and Walker - 4  wheeled  OCCUPATION: on disability   PRECAUTIONS: None ---------------------------------------------------------------------------------------------  SUBJECTIVE:                                                                                                                                                                                           SUBJECTIVE STATEMENT:  ***  No new c/o today.  Continues with R sided pain in low back/hip, 8/10   Eval statement 09/29/2023: Has been having pain for many years, pain has slowly gotten worse, recently in past 2 months has fallen 5 times because her R knee has buckled and she lost her balance. Started using a FWW to ambulate and has not had falls since, however has dropped the walker a few times, and does not always use it for short distances. Pain: 9/10 in R knee, feel like an electrocution or numbness, or locking feeling.  RED FLAGS: None    PLOF: Independent  PATIENT GOALS: stop the knee from giving away  NEXT MD VISIT: need to schedule ---------------------------------------------------------------------------------------------  OBJECTIVE:  Note: Objective measures were completed at Evaluation unless otherwise noted.  DIAGNOSTIC FINDINGS:  No pertinent imaging in chart  PATIENT SURVEYS:  LEFS  Extreme difficulty/unable (0), Quite a bit of difficulty (1), Moderate difficulty (2), Little difficulty (3), No difficulty (4) Survey date:  09/29/2023  Any of your usual work, housework or school activities   2. Usual hobbies, recreational or sporting activities   3. Getting into/out of the bath   4. Walking between rooms   5. Putting on socks/shoes   6. Squatting    7. Lifting an object, like a bag of groceries from the floor   8. Performing light activities around your home   9. Performing heavy activities around your home   10. Getting into/out of a car   11. Walking 2 blocks   12. Walking 1 mile   13. Going up/down 10 stairs  (1 flight)   14. Standing for 1 hour   15.  sitting for 1 hour   16. Running on even ground   17. Running on uneven ground   18. Making sharp turns while running fast   19.  Hopping    20. Rolling over in bed   Score total:  7/80     COGNITION: Overall cognitive status: Within functional limits for tasks assessed   PALPATION: Tenderness around R knee  SENSATION: Light touch: Impaired  around RLE  MUSCLE LENGTH: Hamstrings:B WFL  Thomas test: Right Limited R gastroc Limited  POSTURE: rounded shoulders and forward head   LUMBAR ROM:   AROM eval  Flexion   Extension   Right lateral flexion   Left lateral flexion   Right rotation   Left rotation    (Blank rows = not tested)  ! Indicates pain with testing  LOWER EXTREMITY ROM:     Active  Right eval Left eval  Hip flexion    Hip extension    Hip abduction    Hip adduction    Hip internal rotation    Hip external rotation    Knee flexion    Knee extension    Ankle dorsiflexion    Ankle plantarflexion    Ankle inversion    Ankle eversion     (Blank rows = not tested)  ! Indicates pain with testing  LOWER EXTREMITY MMT:    MMT Right eval Left eval  Hip flexion 100 WFL  Hip extension [facility]  Hip abduction    Hip adduction    Hip internal rotation    Hip external rotation    Knee flexion 110 WFL  Knee extension 0 8  Ankle dorsiflexion 10 10  Ankle plantarflexion    Ankle inversion    Ankle eversion     (Blank rows = not tested)   ! Indicates pain with testing LUMBAR SPECIAL TESTS:  Prone instability test: Positive  FUNCTIONAL TESTS:  30 seconds chair stand test 10  GAIT: Distance walked: 130ft Assistive device utilized: Walker - 4 wheeled Level of assistance: Modified independence Comments: L WS, R trendelenburg, reduced RLE quad recruitment in stance  [facility] Adult PT Treatment:                                                DATE: 10/20/23 Aquatic therapy at [facility]- [address] - therapeutic pool temp approximately 91 degrees. Pt enters building ***. Treatment took place in water 3.8 to  4 ft 8 in. deep depending upon activity.  Pt entered and exited the pool via stair and handrails ***. Patient entered water for aquatic therapy for first time and was introduced to principles and therapeutic effects of water as they ambulated and acclimated to pool.  Aquatic Exercise: Walking forward/backwards/side stepping Lunge stepping forwards x2 laps Side stepping rainbow DB shoulder abd/add x2 laps Runners stretch on bottom step x30 BIL Hamstring stretch on bottom step x30 BIL Figure 4 squat stretch, BIL UE support 2x30 BIL Standing thoracic rotation with noodle x1' STS from 3rd step from bottom x10 Step ups bottom step fwd/lat x10 ea BIL Standing with UE support edge of pool: Hip abd/add x20 BIL Hamstring curl x20 BIL Squats 2x20 Heel/toe raises 2x20 Hip ext/flex with knee straight x 20 BIL Hip Circles CW/CCW x10 each BIL Marching hip flexion to knee extension 2x10 BIL Sitting on submerged bench: Alternating LAQ x1' Bicycle kicks x1' Scissor kicks x1' Kickboard push/pull x1' Kickboard push downs x1'  Pt requires the buoyancy of water for active assisted exercises with buoyancy supported for strengthening  and AROM exercises. Hydrostatic pressure also supports joints by unweighting joint load by at least 50 % in 3-4 feet depth water. 80% in chest to neck deep water. Water will provide assistance with movement using the current and laminar flow while the buoyancy reduces weight bearing. Pt requires the viscosity of the water for resistance with strengthening exercises.  [facility] Adult PT Treatment:                                                DATE: 10/18/2023  Therapeutic Exercise: Gastroc stretch on slant board 2x1' Runners step on 4 box 2x8 B, UE a. PRN Therapeutic Activity: NuStep 8' for activity tolerance Resisted marching 2x12B, hold 1s, RTB Slant  board heel raise  2x15, hold 3s TKE on RLE w/ball at wall 2x10, hold 8s  [facility] Adult PT Treatment:                                                DATE: 10/12/23 Therapeutic Exercise: Nustep L4 8 min seat 6 Neuromuscular re-ed: FAQ with adduction 15x Supine hip fallouts RTB 15x B, 15/15 unilaterally Bridge against RTB 15x S/L clams 15/15 RTB  Therapeutic Activity: Seated hamstring stretch 30s x2 B PF against wall 15x DF against wall 15x STS from airex pad 10x    PATIENT EDUCATION:  Education details: Pt received education regarding HEP performance, ADL performance, functional activity tolerance, impairment education, appropriate performance of therapeutic activities. DKE use Person educated: Patient Education method: Explanation, Demonstration, Tactile cues, Verbal cues, and Handouts Education comprehension: verbalized understanding and returned demonstration  HOME EXERCISE PROGRAM: Access Code: [email or web address] URL: [email or web address] Date: 09/29/2023 Prepared by: Mabel Kiang  Exercises - Sit to Stand Without Arm Support  - 1 x daily - 4 x weekly - 2-3 sets - 15 reps - Sidelying Hip Abduction  - 1 x daily - 4 x weekly - 2-3 sets - 10 reps - 3s hold - Supine March  - 1 x daily - 7 x weekly - 2-3 sets - 10 reps - Active Straight Leg Raise with Quad Set  - 1 x daily - 4 x weekly - 2-3 sets - 15 reps - 3s hold ---------------------------------------------------------------------------------------------  ASSESSMENT:  CLINICAL IMPRESSION:  ***  Pt attended physical therapy session for continuation of treatment regarding gait and R knee pain. Today's treatment focused on improvement of  BLE strengthening, mobility and activity tolerance. Pt reports being able to walk around confidently without rollator at home and supermarket.  Pt showed great  tolerance to administered treatment with no adverse effects by the end of session. Skilled intervention was utilized via  activity modification for pt tolerance with task completion, functional progression/regression promoting best outcomes inline with current rehab goals, as well as moderate verbal/tactile cuing alongside no physical assistance for safe and appropriate performance of today's activities. Continue to stress global strengthening and gait quality with LRAD.  Eval impression (09/29/2023): Pt. attended today's physical therapy session for evaluation of gait and R knee pain. Pt has complaints of R knee pain and buckling causing. Pt has notable deficits and would benefit from therapeutic focus on knee AROM, strength, muscle contraction, muscle length, and gait quality.Treatment performed today focused on pt education detailed in  the objective. Pt demonstrated great understanding of education provided. required moderate cues and supervisory assistance for appropriate performance with today's activities. Pt requires the intervention of skilled outpatient physical therapy to address the aforementioned deficits and progress towards a functional level in line with therapeutic goals.    OBJECTIVE IMPAIRMENTS: Abnormal gait, decreased mobility, difficulty walking, decreased ROM, decreased strength, improper body mechanics, postural dysfunction, and pain.   ACTIVITY LIMITATIONS: standing, squatting, sleeping, stairs, dressing, self feeding, and locomotion level  PARTICIPATION LIMITATIONS: meal prep, cleaning, shopping, and community activity  PERSONAL FACTORS: Behavior pattern, Fitness, Time since onset of injury/illness/exacerbation, Transportation, and 3+ comorbidities: see pmh are also affecting patient's functional outcome.   REHAB POTENTIAL: Fair personal factors  CLINICAL DECISION MAKING: Stable/uncomplicated  EVALUATION COMPLEXITY: Low   GOALS: Goals reviewed with patient? YES  SHORT TERM GOALS: Target date: 10/20/2023  Pt will be independent with administered HEP to demonstrate the competency  necessary for long term managemnet of symptoms at home. Baseline: Goal status: INITIAL   LONG TERM GOALS: Target date: 11/10/2023  Pt. Will achieve a LEFS score of 20/80 as to demonstrate improvement in self-perceived functional ability with daily activities.  Baseline:  Goal status: INITIAL  2.  Pt will improve Global hip strength to a 4+/5 to demonstrate improvement in strength for quality of motion and activity performance.  Baseline:  Goal status: INITIAL  3.   Pt will independently ambulate 10 minutes with LRAD and reduced R sided trendelenburg to demonstrate improved weightbearing tolerance, BLE strength, and functional capacity for community ambulation.  Baseline:  Goal status: INITIAL  4.  Pt will improve 30s STS to 12 to demonstrate improved BLE strength, coordination, and functional transfer ability necessary for daily life Baseline: 8 Goal status: INITIAL ---------------------------------------------------------------------------------------------  PLAN:  PT FREQUENCY: 1-2x/week  PT DURATION: 6 weeks  PLANNED INTERVENTIONS: 97110-Therapeutic exercises, 97530- Therapeutic activity, [id number]- Neuromuscular re-education, 97535- Self Care, [id number]- Manual therapy, [phone]- Gait training, [phone]- Aquatic Therapy, Patient/Family education, Balance training, Stair training, Taping, Joint mobilization, and Spinal mobilization.  PLAN FOR NEXT SESSION: Continue to progress as tolerated within current POC focus.   Mabel Kiang, PT, DPT 10/20/2023, 7:28 AM

## 2023-10-21 ENCOUNTER — Encounter: Payer: Self-pay | Admitting: Internal Medicine

## 2023-10-21 ENCOUNTER — Ambulatory Visit (INDEPENDENT_AMBULATORY_CARE_PROVIDER_SITE_OTHER): Payer: 59 | Admitting: Internal Medicine

## 2023-10-21 VITALS — BP 110/60 | HR 66 | Ht <= 58 in | Wt 185.0 lb

## 2023-10-21 DIAGNOSIS — D352 Benign neoplasm of pituitary gland: Secondary | ICD-10-CM | POA: Diagnosis not present

## 2023-10-21 MED ORDER — DEXAMETHASONE 1 MG PO TABS: 1.0000 mg | ORAL_TABLET | Freq: Once | ORAL | 0 refills | Status: AC

## 2023-10-21 NOTE — Progress Notes (Unsigned)
 Name: Melissa Gaines  MRN/ DOB: [id number], 18-Jan-1971    Age/ Sex: 53 y.o., female    PCP: Alba Sharper, MD   Reason for Endocrinology Evaluation: Pituitary Microadenoma      Date of Initial Endocrinology Evaluation: 10/26/2021    HPI: [name] is a 53 y.o. female with a past medical history of SLE, migraine headaches and Hx of PE. The patient presented for initial endocrinology clinic visit on 10/26/2021 for consultative assistance with her Pituitary microadenoma .   During evaluation for left temporal pain she was noted to have a 4 mm pituitary enlargementon brain MRI 09/2020    Her pituitary hormones were normal on her initial visit to our clinic including prolactin, TFTs, IGF-I, ACTH , cortisol, FSH, and LH.    SUBJECTIVE:    Today (10/21/23): Melissa Gaines is here for follow-up on pituitary microadenoma.  She is on immunotherapy for allergic rhinitis She underwent colonoscopy 09/15/2023 She continues to receive iron infusions due to anemia  Patient has been noted weight loss Severe hypertension- no  DM- no Sudden/ severe headaches- continues with migraine headaches Palpitations- yes, occasionally  Fluid retention- yes Change in shoe/ring size- no change  Patient denies any recent oral or intra-articular glucocorticoid intake   HISTORY:  Past Medical History:  Past Medical History:  Diagnosis Date   Anemia    Anxiety    Arthritis    Asthma    Cancer (HCC)    skin cancer   Chronic pain    Complication of anesthesia    Asthma attack during colonoscopy and EGD   Congenital hip dislocation    Depression    GERD (gastroesophageal reflux disease)    Iron deficiency anemia 04/14/2018   Kidney mass 2017   Migraine    Osteoporosis    Pathological dislocation of shoulder joint, bilateral    congential   Pneumonia    as a child   PTSD (post-traumatic stress disorder)    Sleep apnea    Hx.  of no CPAP   Systemic lupus erythematosus (HCC)    Tuberculosis    as a child was treated   Urticaria    Past Surgical History:  Past Surgical History:  Procedure Laterality Date   CESAREAN SECTION  2005   CESAREAN SECTION W/BTL  2010   COLONOSCOPY N/A 09/15/2023   Procedure: COLONOSCOPY;  Surgeon: Leigh Elspeth SQUIBB, MD;  Location: [facility];  Service: Gastroenterology;  Laterality: N/A;   ESOPHAGOGASTRODUODENOSCOPY N/A 09/15/2023   Procedure: EGD (ESOPHAGOGASTRODUODENOSCOPY);  Surgeon: Leigh Elspeth SQUIBB, MD;  Location: [facility];  Service: Gastroenterology;  Laterality: N/A;   HIP SURGERY     in childhood for congenital hip dislocation   LAPAROSCOPIC GASTRIC SLEEVE RESECTION  2014   POLYPECTOMY  09/15/2023   Procedure: POLYPECTOMY, INTESTINE;  Surgeon: Leigh Elspeth SQUIBB, MD;  Location: [facility];  Service: Gastroenterology;;   HARLEY DILATION N/A 09/15/2023   Procedure: EGD, WITH DILATION USING SAVARY-GILLIARD DILATOR OVER GUIDEWIRE;  Surgeon: Leigh Elspeth SQUIBB, MD;  Location: [facility];  Service: Gastroenterology;  Laterality: N/A;   STAPEDECTOMY  11/01/2011   L postauricular stapedectomy with CO2 laser and insertion of 6 x 4.75 mm SMart Piston   TOTAL HIP ARTHROPLASTY     05/2000 R, 11/2000 L    Social History:  reports that she has never smoked. She has been exposed to tobacco smoke. She has never used smokeless tobacco. She reports that she does not currently use alcohol.  She reports that she does not use drugs. Family History: family history includes ADD / ADHD in her son; Anxiety disorder in her mother and sister; Asthma in her mother and sister; Bipolar disorder in her daughter; Cancer in an other family member; Depression in her mother and sister; Heart Problems in her mother; Hernia in her father; Kidney Stones in her father and mother; Kidney disease in her father; Liver cancer in her mother; Lung cancer in her maternal grandmother; Mental illness in her  half-sister; Osteoporosis in her mother; Rectal cancer in her maternal grandfather; Skin cancer in her paternal grandmother; Stroke in her mother.   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 Throat closes and short of breath.        Medication List        Accurate as of October 21, 2023  9:53 AM. If you have any questions, ask your nurse or doctor.          acetaminophen  500 MG tablet Commonly known as: TYLENOL  Take 500 mg by mouth every 6 (six) hours as needed for headache.   Aimovig  140 MG/ML Soaj Generic drug: Erenumab -aooe Inject 140 mg into the skin every 30 (thirty) days.   albuterol  108 (90 Base) MCG/ACT inhaler Commonly known as: VENTOLIN  HFA Inhale 2 puffs into the lungs every 6 (six) hours as needed for wheezing or shortness of breath.   atorvastatin  80 MG tablet Commonly known as: LIPITOR Take 1 tablet (80 mg total) by mouth daily.   azelastine  0.1 % nasal spray Commonly known as: ASTELIN  Place 2 sprays into both nostrils 2 (two) times daily. Use in each nostril as directed   budesonide -formoterol  160-4.5 MCG/ACT inhaler Commonly known as: Symbicort  Inhale 2 puffs into the lungs daily.   cetirizine  10 MG tablet Commonly known as: ZYRTEC  Take 1 tablet (10 mg total) by mouth daily.   cromolyn  4 % ophthalmic solution Commonly known as: OPTICROM  Place 1 drop into both eyes 4 (four) times daily.   cyanocobalamin  1000 MCG/ML injection Commonly known as: VITAMIN B12 Inject 1 mL (1,000 mcg total) into the skin every 30 (thirty) days.   diclofenac  Sodium 1 % Gel Commonly known as: Voltaren  Arthritis Pain Apply 4 g topically 4 (four) times daily.   Eliquis  5 MG Tabs tablet Generic drug: apixaban  TAKE 1 TABLET BY MOUTH TWICE  DAILY NEEDS PCP APPOINTMENT  BEFORE MORE FILLS   EPINEPHrine  0.3 mg/0.3 mL Soaj injection Commonly known as: EPI-PEN INJECT INTRAMUSCULARLY 1 PEN AS  NEEDED  FOR ALLERGIC RESPONSE AS  DIRECTED BY MD. [facility]  HELP AFTER USE.   ezetimibe  10 MG tablet Commonly known as: Zetia  Take 1 tablet (10 mg total) by mouth daily.   famotidine  20 MG tablet Commonly known as: PEPCID  TAKE 1 TABLET BY MOUTH TWICE  DAILY   ferrous sulfate  325 (65 FE) MG tablet Take 1 tablet (325 mg total) by mouth 3 (three) times daily with meals.   FLUoxetine  20 MG capsule Commonly known as: PROZAC  Take 1 capsule (20 mg total) by mouth daily.   fluticasone  50 MCG/ACT nasal spray Commonly known as: FLONASE  Place 2 sprays into both nostrils daily.   gabapentin  400 MG capsule Commonly known as: NEURONTIN  Take 1 capsule (400 mg total) by mouth at bedtime.   levocetirizine 5 MG tablet Commonly known as: XYZAL  TAKE 1 TABLET BY MOUTH EVERY  EVENING   Na Sulfate-K  Sulfate-Mg Sulfate concentrate 17.5-3.13-1.6 GM/177ML Soln Commonly known as: Suprep Bowel Prep Kit Take 1 kit (354 mLs total) by mouth as directed. For colonoscopy prep   Restasis  0.05 % ophthalmic emulsion Generic drug: cycloSPORINE  Place 1 drop into both eyes 2 (two) times daily.   Shingrix  injection Generic drug: Zoster Vaccine Adjuvanted Administer Shingrix  vaccination now and repeat in two months   SYRINGE/NEEDLE (DISP) 1 ML 25G X 5/8 1 ML Misc Match with B12   traZODone  100 MG tablet Commonly known as: DESYREL  Take 1 tablet (100 mg total) by mouth at bedtime.   Ubrelvy  100 MG Tabs Generic drug: Ubrogepant  Take 1 tablet (100 mg total) by mouth as needed (May repeat in 2 hours.  Maximum 2 tablets in 24 hours).   Vitamin D  (Ergocalciferol ) 1.25 MG (50000 UNIT) Caps capsule Commonly known as: DRISDOL  Take 1 capsule (50,000 Units total) by mouth every 7 (seven) days.   Voquezna  10 MG Tabs Generic drug: Vonoprazan Fumarate  Take 10 mg by mouth daily. Lot: [id number], Exp 05-2024          REVIEW OF SYSTEMS: A comprehensive ROS was conducted with the patient and is negative except as per  HPI    OBJECTIVE:  VS: BP 110/60 (BP Location: Left Arm, Patient Position: Sitting, Cuff Size: Normal)   Pulse 66   Ht 4' 9 (1.448 m)   Wt 185 lb (83.9 kg)   SpO2 98%   BMI 40.03 kg/m    Wt Readings from Last 3 Encounters:  10/21/23 185 lb (83.9 kg)  10/17/23 186 lb 12.8 oz (84.7 kg)  09/19/23 188 lb (85.3 kg)     EXAM: General: Pt appears well and is in NAD  Neck:  Thyroid : Thyroid  size normal.  No goiter or nodules appreciated.  Lungs: Clear with good BS bilat   Heart: Auscultation: RRR.  Extremities:  BL LE: No pretibial edema   Mental Status: Judgment, insight: Intact Orientation: Oriented to time, place, and person Mood and affect: No depression, anxiety, or agitation     DATA REVIEWED:   Latest Reference Range & Units 10/21/23 10:18  Sodium 135 - 146 mmol/L 138  Potassium 3.5 - 5.3 mmol/L 4.6  Chloride 98 - 110 mmol/L 100  CO2 20 - 32 mmol/L 29  Glucose 65 - 99 mg/dL 82  BUN 7 - 25 mg/dL 9  Creatinine 9.49 - 8.96 mg/dL 9.35  Calcium  8.6 - 10.4 mg/dL 9.7  BUN/Creatinine Ratio 6 - 22 (calc) SEE NOTE:  eGFR > OR = 60 mL/min/1.44m2 107    Latest Reference Range & Units 10/21/23 10:18  Cortisol, Plasma mcg/dL 84.9  FSH mIU/mL 77.0  Prolactin ng/mL 7.2  Glucose 65 - 99 mg/dL 82  TSH mIU/L 8.29  U5,Qmzz(Ipmzru) 0.8 - 1.8 ng/dL 1.0        Brain MRI 1/84/7976 Sella: Stable appearance of the infundibulum with mild nodular enhancement just below the hypothalamus with small caliber of the remainder. There is no sellar or suprasellar mass.   Brain: There is no acute infarction or intracranial hemorrhage. There is no intracranial mass, mass effect, or edema. There is no hydrocephalus or extra-axial fluid collection. Ventricles and sulci are normal in size and configuration. No abnormal enhancement.   Vascular: Major vessel flow voids at the skull base are preserved.   Skull and upper cervical spine: Normal marrow signal is preserved.    Sinuses/Orbits: Paranasal sinus mucosal thickening. Orbits are unremarkable.   Other: Patchy right mastoid fluid opacification.   IMPRESSION:  Stable small nodular enhancement of the pituitary infundibulum just below the hypothalamus. Remains of unclear etiology or significance but does not represent an aggressive process. No new findings.  ASSESSMENT/PLAN/RECOMMENDATIONS:   Pituitary Microadenoma:  -Patient has no clinical evidence of hyper or hypopituitarism -Will proceed with MRI of pituitary gland for this year -Historically pituitary hormones have been normal including prolactin, TFTs, ACTH , and IGF-I - BMP, TFT's, FSH, and cortisol is normal  - ACTH , IGF-1 pending    Follow-up in 1 year    Signed electronically by: Stefano Redgie Butts, MD  [facility] Endocrinology  [facility] [address] [place] [place], [place] [address] Phone: [phone] FAX: [phone]   CC: Alba Sharper, MD [address] [place] [address] Phone: [phone] Fax: [phone]   Return to Endocrinology clinic as below: Future Appointments  Date Time Provider Department Center  10/21/2023 10:10 AM Dacoda Finlay, Donell Redgie, MD [facility] None  10/26/2023  8:30 AM Abran Mabel SAUNDERS, PT [facility]  10/27/2023  2:45 PM Trudy Krabbe, PTA [facility]  11/01/2023 10:00 AM Abran Mabel SAUNDERS, PT [facility]  11/02/2023 12:30 PM [facility] FLUSH [facility] None  11/04/2023 10:30 AM Luke Orlan HERO, DO [facility] None  11/16/2023 10:30 AM Alba Sharper, MD [facility]  11/17/2023 10:50 AM Skeet Cornet R, DO [facility] None  11/21/2023 10:15 AM [facility] LAB [facility] None  11/21/2023 10:45 AM [facility] FLUSH [facility] None  11/24/2023  9:40 AM Curry Leni DASEN, MD [facility] None  12/21/2023 12:30 PM [facility] FLUSH [facility] None  01/18/2024 12:30 PM [facility] FLUSH [facility] None  02/15/2024 12:30 PM [facility] FLUSH [facility] None   03/14/2024 12:30 PM [facility] FLUSH [facility] None  04/11/2024 12:30 PM [facility] FLUSH [facility] None  05/21/2024 10:30 AM [facility] LAB [facility] None  05/21/2024 11:00 AM Lanny Callander, MD [facility] None  05/21/2024 11:45 AM [facility] FLUSH [facility] None  09/20/2024 11:50 AM [facility] ANNUAL WELLNESS VISIT [facility]

## 2023-10-21 NOTE — Patient Instructions (Signed)
   Instructions for Dexamethasone Suppression Test   Step 1: Choose a morning when you can come to our lab at 8:00 am for a blood draw.   Step 2: On the night before the blood draw, take one 1 mg tablet of dexamethasone at 11:30 pm.  The timing is VERY important!   Step 3: The next morning, go to the lab for blood work at 8:00 am.  Melissa Quin do not have to be on an empty stomach, but the timing is VERY important!

## 2023-10-24 ENCOUNTER — Ambulatory Visit: Payer: Self-pay | Admitting: Internal Medicine

## 2023-10-24 DIAGNOSIS — D352 Benign neoplasm of pituitary gland: Secondary | ICD-10-CM

## 2023-10-25 ENCOUNTER — Encounter: Admitting: Physical Therapy

## 2023-10-26 ENCOUNTER — Ambulatory Visit: Admitting: Physical Therapy

## 2023-10-26 ENCOUNTER — Encounter: Payer: Self-pay | Admitting: Physical Therapy

## 2023-10-26 DIAGNOSIS — G8929 Other chronic pain: Secondary | ICD-10-CM | POA: Diagnosis not present

## 2023-10-26 DIAGNOSIS — R2689 Other abnormalities of gait and mobility: Secondary | ICD-10-CM | POA: Diagnosis not present

## 2023-10-26 DIAGNOSIS — M25561 Pain in right knee: Secondary | ICD-10-CM | POA: Diagnosis not present

## 2023-10-26 DIAGNOSIS — M6281 Muscle weakness (generalized): Secondary | ICD-10-CM | POA: Diagnosis not present

## 2023-10-26 LAB — BASIC METABOLIC PANEL WITH GFR
BUN: 9 mg/dL (ref 7–25)
CO2: 29 mmol/L (ref 20–32)
Calcium: 9.7 mg/dL (ref 8.6–10.4)
Chloride: 100 mmol/L (ref 98–110)
Creat: 0.64 mg/dL (ref 0.50–1.03)
Glucose, Bld: 82 mg/dL (ref 65–99)
Potassium: 4.6 mmol/L (ref 3.5–5.3)
Sodium: 138 mmol/L (ref 135–146)
eGFR: 107 mL/min/1.73m2 (ref >=60)

## 2023-10-26 LAB — PROLACTIN: Prolactin: 7.2 ng/mL

## 2023-10-26 LAB — TSH: TSH: 1.7 m[IU]/L

## 2023-10-26 LAB — CORTISOL: Cortisol, Plasma: 15 ug/dL

## 2023-10-26 LAB — INSULIN-LIKE GROWTH FACTOR
IGF-I, LC/MS: 73 ng/mL (ref 50–317)
Z-Score (Female): -1.3 {STDV} (ref ?–2.0)

## 2023-10-26 LAB — ACTH: C206 ACTH: 29 pg/mL (ref 6–50)

## 2023-10-26 LAB — T4, FREE: Free T4: 1 ng/dL (ref 0.8–1.8)

## 2023-10-26 LAB — FOLLICLE STIMULATING HORMONE: FSH: 22.9 m[IU]/mL

## 2023-10-26 NOTE — Therapy (Signed)
 OUTPATIENT PHYSICAL THERAPY TREATMENT NOTE   Patient Name: Melissa Gaines MRN: [id number] DOB:1971-03-07, 53 y.o., female Today's Date: 10/26/2023  END OF SESSION:  PT End of Session - 10/26/23 1145     Visit Number 5    Number of Visits 13    Date for PT Re-Evaluation 11/10/23    PT Start Time 1125    PT Stop Time 1205    PT Time Calculation (min) 40 min    Activity Tolerance Patient tolerated treatment well    Behavior During Therapy [facility] for tasks assessed/performed              Past Medical History:  Diagnosis Date   Anemia    Anxiety    Arthritis    Asthma    Cancer (HCC)    skin cancer   Chronic pain    Complication of anesthesia    Asthma attack during colonoscopy and EGD   Congenital hip dislocation    Depression    GERD (gastroesophageal reflux disease)    Iron deficiency anemia 04/14/2018   Kidney mass 2017   Migraine    Osteoporosis    Pathological dislocation of shoulder joint, bilateral    congential   Pneumonia    as a child   PTSD (post-traumatic stress disorder)    Sleep apnea    Hx. of no CPAP   Systemic lupus erythematosus (HCC)    Tuberculosis    as a child was treated   Urticaria    Past Surgical History:  Procedure Laterality Date   CESAREAN SECTION  2005   CESAREAN SECTION W/BTL  2010   COLONOSCOPY N/A 09/15/2023   Procedure: COLONOSCOPY;  Surgeon: Leigh Elspeth SQUIBB, MD;  Location: [facility];  Service: Gastroenterology;  Laterality: N/A;   ESOPHAGOGASTRODUODENOSCOPY N/A 09/15/2023   Procedure: EGD (ESOPHAGOGASTRODUODENOSCOPY);  Surgeon: Leigh Elspeth SQUIBB, MD;  Location: [facility];  Service: Gastroenterology;  Laterality: N/A;   HIP SURGERY     in childhood for congenital hip dislocation   LAPAROSCOPIC GASTRIC SLEEVE RESECTION  2014   POLYPECTOMY  09/15/2023   Procedure: POLYPECTOMY, INTESTINE;  Surgeon: Leigh Elspeth SQUIBB, MD;  Location: [facility];  Service: Gastroenterology;;   HARLEY DILATION  N/A 09/15/2023   Procedure: EGD, WITH DILATION USING SAVARY-GILLIARD DILATOR OVER GUIDEWIRE;  Surgeon: Leigh Elspeth SQUIBB, MD;  Location: [facility];  Service: Gastroenterology;  Laterality: N/A;   STAPEDECTOMY  11/01/2011   L postauricular stapedectomy with CO2 laser and insertion of 6 x 4.75 mm SMart Piston   TOTAL HIP ARTHROPLASTY     05/2000 R, 11/2000 L   Patient Active Problem List   Diagnosis Date Noted   Dysphagia 09/15/2023   Benign neoplasm of colon 09/15/2023   B12 deficiency 05/11/2022   Hyperlipidemia 05/07/2022   Prediabetes 05/07/2022   Environmental and seasonal allergies 08/03/2021   History of colonoscopy 07/31/2021   Asthma 07/01/2021   Gastroesophageal reflux disease 08/19/2020   Tinnitus aurium, 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   Lumbar back pain 01/25/2020   Left hip pain 06/26/2019   Muscle strain of lower extremity, left, initial encounter 06/26/2019   Colon cancer screening 06/26/2019   Iron deficiency anemia 04/14/2018   Hematuria, gross 03/29/2018   Iron deficiency anemia due to chronic blood loss 03/29/2018   Long term current use of anticoagulant 03/29/2018   Antiphospholipid antibody syndrome (HCC) 01/25/2018   Morbid obesity (HCC) 07/26/2017   Systemic lupus erythematosus (HCC) 07/08/2017   Hx pulmonary embolism 07/08/2017   Chronic pain of  right knee 05/23/2017   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/21/2016   Vitamin D  deficiency 12/16/2016   Anemia    Migraine     PCP: Alba Sharper, MD  REFERRING PROVIDER: Madelon Donald HERO, DO  REFERRING DIAG: Functional gait abnormality [R26.89], Chronic pain of right knee [M25.561, G89.29], Lumbar back pain [M54.50]   Rationale for Evaluation and Treatment: Rehabilitation  THERAPY DIAG:  Chronic pain of right knee  Other abnormalities of gait and mobility  Muscle weakness (generalized)  PERTINENT HISTORY: Speech impediment, anemia, lupus, tinnitus, astham, prediabetes, vit D deficiency, knee surgery in early  2000s  WEIGHT BEARING RESTRICTIONS: No  FALLS:  Has patient fallen in last 6 months? Yes. Number of falls 5, knee gave out and she lost her balance, multiple times on the stairs   LIVING ENVIRONMENT: Lives with: lives with their family Lives in: House/apartment Stairs: Yes: External: 12 steps; recently moved to the first floor of complex Has following equipment at home: Single point cane and Walker - 4 wheeled  OCCUPATION: on disability   PRECAUTIONS: None ---------------------------------------------------------------------------------------------  SUBJECTIVE:                                                                                                                                                                                           SUBJECTIVE STATEMENT:  No new c/o today.  Continues with R sided pain in low back/hip, 8/10   Eval statement 09/29/2023: Has been having pain for many years, pain has slowly gotten worse, recently in past 2 months has fallen 5 times because her R knee has buckled and she lost her balance. Started using a FWW to ambulate and has not had falls since, however has dropped the walker a few times, and does not always use it for short distances. Pain: 9/10 in R knee, feel like an electrocution or numbness, or locking feeling.  RED FLAGS: None    PLOF: Independent  PATIENT GOALS: stop the knee from giving away  NEXT MD VISIT: need to schedule ---------------------------------------------------------------------------------------------  OBJECTIVE:  Note: Objective measures were completed at Evaluation unless otherwise noted.  DIAGNOSTIC FINDINGS:  No pertinent imaging in chart  PATIENT SURVEYS:  LEFS  Extreme difficulty/unable (0), Quite a bit of difficulty (1), Moderate difficulty (2), Little difficulty (3), No difficulty (4) Survey date:  09/29/2023  Any of your usual work, housework or school activities   2. Usual hobbies,  recreational or sporting activities   3. Getting into/out of the bath   4. Walking between rooms   5. Putting on socks/shoes   6. Squatting    7. Lifting an object, like a bag of groceries from  the floor   8. Performing light activities around your home   9. Performing heavy activities around your home   10. Getting into/out of a car   11. Walking 2 blocks   12. Walking 1 mile   13. Going up/down 10 stairs (1 flight)   14. Standing for 1 hour   15.  sitting for 1 hour   16. Running on even ground   17. Running on uneven ground   18. Making sharp turns while running fast   19. Hopping    20. Rolling over in bed   Score total:  7/80     COGNITION: Overall cognitive status: Within functional limits for tasks assessed   PALPATION: Tenderness around R knee  SENSATION: Light touch: Impaired  around RLE  MUSCLE LENGTH: Hamstrings:B WFL  Thomas test: Right Limited R gastroc Limited  POSTURE: rounded shoulders and forward head   LUMBAR ROM:   AROM eval  Flexion   Extension   Right lateral flexion   Left lateral flexion   Right rotation   Left rotation    (Blank rows = not tested)  ! Indicates pain with testing  LOWER EXTREMITY ROM:     Active  Right eval Left eval  Hip flexion    Hip extension    Hip abduction    Hip adduction    Hip internal rotation    Hip external rotation    Knee flexion    Knee extension    Ankle dorsiflexion    Ankle plantarflexion    Ankle inversion    Ankle eversion     (Blank rows = not tested)  ! Indicates pain with testing  LOWER EXTREMITY MMT:    MMT Right eval Left eval  Hip flexion 100 WFL  Hip extension [facility]  Hip abduction    Hip adduction    Hip internal rotation    Hip external rotation    Knee flexion 110 WFL  Knee extension 0 8  Ankle dorsiflexion 10 10  Ankle plantarflexion    Ankle inversion    Ankle eversion     (Blank rows = not tested)   ! Indicates pain with testing LUMBAR SPECIAL TESTS:   Prone instability test: Positive  FUNCTIONAL TESTS:  30 seconds chair stand test 10  GAIT: Distance walked: 15ft Assistive device utilized: Environmental consultant - 4 wheeled Level of assistance: Modified independence Comments: L WS, R trendelenburg, reduced RLE quad recruitment in stance  [facility] Adult PT Treatment:                                                DATE: 10/26/2023 Therapeutic Activity: NuStep 8' for activity tolerance Supine Resisted marching 2x12B, hold 1s, RTB Supine SLR  2x12, hold 5s, RLE STS staggered stance 2x12, RLE bias Bent knee fall out 2x12 B, 2s hold, GTB  [facility] Adult PT Treatment:                                                DATE: 10/18/2023  Therapeutic Exercise: Gastroc stretch on slant board 2x1' Runners step on 4 box 2x8 B, UE a. PRN Therapeutic Activity: NuStep 8' for activity tolerance Resisted marching 2x12B, hold 1s, RTB Slant  board heel raise  2x15, hold 3s TKE on RLE w/ball at wall 2x10, hold 8s  PATIENT EDUCATION:  Education details: Pt received education regarding HEP performance, ADL performance, functional activity tolerance, impairment education, appropriate performance of therapeutic activities. DKE use Person educated: Patient Education method: Explanation, Demonstration, Tactile cues, Verbal cues, and Handouts Education comprehension: verbalized understanding and returned demonstration  HOME EXERCISE PROGRAM: Access Code: [email or web address] URL: [email or web address] Date: 09/29/2023 Prepared by: Mabel Kiang  Exercises - Sit to Stand Without Arm Support  - 1 x daily - 4 x weekly - 2-3 sets - 15 reps - Sidelying Hip Abduction  - 1 x daily - 4 x weekly - 2-3 sets - 10 reps - 3s hold - Supine March  - 1 x daily - 7 x weekly - 2-3 sets - 10 reps - Active Straight Leg Raise with Quad Set  - 1 x daily - 4 x weekly - 2-3 sets - 15 reps - 3s  hold ---------------------------------------------------------------------------------------------  ASSESSMENT:  CLINICAL IMPRESSION:  Pt attended physical therapy session for continuation of treatment regarding R knee pain. Today's treatment focused on improvement of  Activity tolerance, R hip/knee motor recruitment, proximal hip strengthening, and transfer quality. Pt has had some attendance issues with aquatics which is related to medical transportation scheduling and mal organization. Pt showed great tolerance to administered treatment with no adverse effects by the end of session. Skilled intervention was utilized via activity modification for pt tolerance with task completion, functional progression/regression promoting best outcomes inline with current rehab goals, as well as minimal verbal/tactile cuing alongside no physical assistance for safe and appropriate performance of today's activities. Continue to progress as tolerated with current POC focus.  Eval impression (09/29/2023): Pt. attended today's physical therapy session for evaluation of gait and R knee pain. Pt has complaints of R knee pain and buckling causing. Pt has notable deficits and would benefit from therapeutic focus on knee AROM, strength, muscle contraction, muscle length, and gait quality.Treatment performed today focused on pt education detailed in the objective. Pt demonstrated great understanding of education provided. required moderate cues and supervisory assistance for appropriate performance with today's activities. Pt requires the intervention of skilled outpatient physical therapy to address the aforementioned deficits and progress towards a functional level in line with therapeutic goals.    OBJECTIVE IMPAIRMENTS: Abnormal gait, decreased mobility, difficulty walking, decreased ROM, decreased strength, improper body mechanics, postural dysfunction, and pain.   ACTIVITY LIMITATIONS: standing, squatting, sleeping,  stairs, dressing, self feeding, and locomotion level  PARTICIPATION LIMITATIONS: meal prep, cleaning, shopping, and community activity  PERSONAL FACTORS: Behavior pattern, Fitness, Time since onset of injury/illness/exacerbation, Transportation, and 3+ comorbidities: see pmh are also affecting patient's functional outcome.   REHAB POTENTIAL: Fair personal factors  CLINICAL DECISION MAKING: Stable/uncomplicated  EVALUATION COMPLEXITY: Low   GOALS: Goals reviewed with patient? YES  SHORT TERM GOALS: Target date: 10/20/2023  Pt will be independent with administered HEP to demonstrate the competency necessary for long term managemnet of symptoms at home. Baseline: Goal status: INITIAL   LONG TERM GOALS: Target date: 11/10/2023  Pt. Will achieve a LEFS score of 20/80 as to demonstrate improvement in self-perceived functional ability with daily activities.  Baseline:  Goal status: INITIAL  2.  Pt will improve Global hip strength to a 4+/5 to demonstrate improvement in strength for quality of motion and activity performance.  Baseline:  Goal status: INITIAL  3.   Pt will independently ambulate 10 minutes with LRAD and reduced R sided  trendelenburg to demonstrate improved weightbearing tolerance, BLE strength, and functional capacity for community ambulation.  Baseline:  Goal status: INITIAL  4.  Pt will improve 30s STS to 12 to demonstrate improved BLE strength, coordination, and functional transfer ability necessary for daily life Baseline: 8 Goal status: INITIAL ---------------------------------------------------------------------------------------------  PLAN:  PT FREQUENCY: 1-2x/week  PT DURATION: 6 weeks  PLANNED INTERVENTIONS: 97110-Therapeutic exercises, 97530- Therapeutic activity, [id number]- Neuromuscular re-education, 97535- Self Care, [id number]- Manual therapy, [phone]- Gait training, [phone]- Aquatic Therapy, Patient/Family education, Balance training, Stair training, Taping,  Joint mobilization, and Spinal mobilization.  PLAN FOR NEXT SESSION: Continue to progress as tolerated within current POC focus.   Mabel Kiang, PT, DPT 10/26/2023, 12:05 PM

## 2023-10-27 ENCOUNTER — Encounter (HOSPITAL_COMMUNITY): Payer: Self-pay | Admitting: Internal Medicine

## 2023-10-27 ENCOUNTER — Ambulatory Visit

## 2023-10-28 ENCOUNTER — Encounter (HOSPITAL_COMMUNITY): Payer: Self-pay | Admitting: Internal Medicine

## 2023-10-30 ENCOUNTER — Inpatient Hospital Stay (HOSPITAL_COMMUNITY)

## 2023-10-30 ENCOUNTER — Emergency Department (HOSPITAL_COMMUNITY)

## 2023-10-30 ENCOUNTER — Encounter (HOSPITAL_COMMUNITY): Payer: Self-pay | Admitting: Family Medicine

## 2023-10-30 ENCOUNTER — Inpatient Hospital Stay (HOSPITAL_COMMUNITY)
Admission: EM | Admit: 2023-10-30 | Discharge: 2023-11-02 | DRG: 389 | Disposition: A | Discharge status: Home / Self Care | Attending: Family Medicine | Admitting: Family Medicine

## 2023-10-30 ENCOUNTER — Other Ambulatory Visit: Payer: Self-pay

## 2023-10-30 DIAGNOSIS — M810 Age-related osteoporosis without current pathological fracture: Secondary | ICD-10-CM | POA: Diagnosis present

## 2023-10-30 DIAGNOSIS — Z043 Encounter for examination and observation following other accident: Secondary | ICD-10-CM | POA: Diagnosis not present

## 2023-10-30 DIAGNOSIS — Z825 Family history of asthma and other chronic lower respiratory diseases: Secondary | ICD-10-CM

## 2023-10-30 DIAGNOSIS — Z6841 Body Mass Index (BMI) 40.0 and over, adult: Secondary | ICD-10-CM

## 2023-10-30 DIAGNOSIS — Z8249 Family history of ischemic heart disease and other diseases of the circulatory system: Secondary | ICD-10-CM

## 2023-10-30 DIAGNOSIS — Z931 Gastrostomy status: Secondary | ICD-10-CM | POA: Diagnosis not present

## 2023-10-30 DIAGNOSIS — K5669 Other partial intestinal obstruction: Secondary | ICD-10-CM | POA: Diagnosis not present

## 2023-10-30 DIAGNOSIS — M329 Systemic lupus erythematosus, unspecified: Secondary | ICD-10-CM | POA: Diagnosis present

## 2023-10-30 DIAGNOSIS — R1084 Generalized abdominal pain: Secondary | ICD-10-CM | POA: Diagnosis not present

## 2023-10-30 DIAGNOSIS — K449 Diaphragmatic hernia without obstruction or gangrene: Secondary | ICD-10-CM | POA: Diagnosis present

## 2023-10-30 DIAGNOSIS — R1013 Epigastric pain: Secondary | ICD-10-CM | POA: Diagnosis not present

## 2023-10-30 DIAGNOSIS — E559 Vitamin D deficiency, unspecified: Secondary | ICD-10-CM | POA: Diagnosis present

## 2023-10-30 DIAGNOSIS — G43909 Migraine, unspecified, not intractable, without status migrainosus: Secondary | ICD-10-CM | POA: Diagnosis present

## 2023-10-30 DIAGNOSIS — E872 Acidosis, unspecified: Secondary | ICD-10-CM | POA: Diagnosis present

## 2023-10-30 DIAGNOSIS — D6861 Antiphospholipid syndrome: Secondary | ICD-10-CM | POA: Diagnosis present

## 2023-10-30 DIAGNOSIS — Z91013 Allergy to seafood: Secondary | ICD-10-CM | POA: Diagnosis not present

## 2023-10-30 DIAGNOSIS — Z7901 Long term (current) use of anticoagulants: Secondary | ICD-10-CM | POA: Diagnosis not present

## 2023-10-30 DIAGNOSIS — Z98891 History of uterine scar from previous surgery: Secondary | ICD-10-CM

## 2023-10-30 DIAGNOSIS — E669 Obesity, unspecified: Secondary | ICD-10-CM | POA: Diagnosis present

## 2023-10-30 DIAGNOSIS — Z808 Family history of malignant neoplasm of other organs or systems: Secondary | ICD-10-CM

## 2023-10-30 DIAGNOSIS — Z85828 Personal history of other malignant neoplasm of skin: Secondary | ICD-10-CM

## 2023-10-30 DIAGNOSIS — K219 Gastro-esophageal reflux disease without esophagitis: Secondary | ICD-10-CM | POA: Diagnosis present

## 2023-10-30 DIAGNOSIS — K56600 Partial intestinal obstruction, unspecified as to cause: Principal | ICD-10-CM | POA: Diagnosis present

## 2023-10-30 DIAGNOSIS — Z79899 Other long term (current) drug therapy: Secondary | ICD-10-CM

## 2023-10-30 DIAGNOSIS — Z86711 Personal history of pulmonary embolism: Secondary | ICD-10-CM

## 2023-10-30 DIAGNOSIS — Z96643 Presence of artificial hip joint, bilateral: Secondary | ICD-10-CM | POA: Diagnosis present

## 2023-10-30 DIAGNOSIS — Z4682 Encounter for fitting and adjustment of non-vascular catheter: Secondary | ICD-10-CM | POA: Diagnosis not present

## 2023-10-30 DIAGNOSIS — K56609 Unspecified intestinal obstruction, unspecified as to partial versus complete obstruction: Secondary | ICD-10-CM | POA: Diagnosis present

## 2023-10-30 DIAGNOSIS — Z7951 Long term (current) use of inhaled steroids: Secondary | ICD-10-CM | POA: Diagnosis not present

## 2023-10-30 DIAGNOSIS — F4310 Post-traumatic stress disorder, unspecified: Secondary | ICD-10-CM | POA: Diagnosis present

## 2023-10-30 DIAGNOSIS — Z789 Other specified health status: Secondary | ICD-10-CM

## 2023-10-30 DIAGNOSIS — K6389 Other specified diseases of intestine: Secondary | ICD-10-CM | POA: Diagnosis not present

## 2023-10-30 DIAGNOSIS — R112 Nausea with vomiting, unspecified: Secondary | ICD-10-CM | POA: Diagnosis not present

## 2023-10-30 DIAGNOSIS — R109 Unspecified abdominal pain: Secondary | ICD-10-CM | POA: Diagnosis not present

## 2023-10-30 DIAGNOSIS — J45909 Unspecified asthma, uncomplicated: Secondary | ICD-10-CM | POA: Diagnosis present

## 2023-10-30 DIAGNOSIS — J4550 Severe persistent asthma, uncomplicated: Secondary | ICD-10-CM

## 2023-10-30 DIAGNOSIS — E785 Hyperlipidemia, unspecified: Secondary | ICD-10-CM | POA: Diagnosis present

## 2023-10-30 DIAGNOSIS — Z8719 Personal history of other diseases of the digestive system: Secondary | ICD-10-CM

## 2023-10-30 DIAGNOSIS — Z9884 Bariatric surgery status: Secondary | ICD-10-CM

## 2023-10-30 LAB — CBC WITH DIFFERENTIAL/PLATELET
Abs Immature Granulocytes: 0.04 K/uL (ref 0.00–0.07)
Basophils Absolute: 0 K/uL (ref 0.0–0.1)
Basophils Relative: 0 %
Eosinophils Absolute: 0.1 K/uL (ref 0.0–0.5)
Eosinophils Relative: 1 %
HCT: 41.1 % (ref 36.0–46.0)
Hemoglobin: 13.5 g/dL (ref 12.0–15.0)
Immature Granulocytes: 1 %
Lymphocytes Relative: 22 %
Lymphs Abs: 1.7 K/uL (ref 0.7–4.0)
MCH: 28.7 pg (ref 26.0–34.0)
MCHC: 32.8 g/dL (ref 30.0–36.0)
MCV: 87.3 fL (ref 80.0–100.0)
Monocytes Absolute: 0.2 K/uL (ref 0.1–1.0)
Monocytes Relative: 3 %
Neutro Abs: 5.7 K/uL (ref 1.7–7.7)
Neutrophils Relative %: 73 %
Platelets: 271 K/uL (ref 150–400)
RBC: 4.71 MIL/uL (ref 3.87–5.11)
RDW: 13.2 % (ref 11.5–15.5)
WBC: 7.8 K/uL (ref 4.0–10.5)
nRBC: 0 % (ref 0.0–0.2)

## 2023-10-30 LAB — COMPREHENSIVE METABOLIC PANEL WITH GFR
ALT: 12 U/L (ref 0–44)
AST: 17 U/L (ref 15–41)
Albumin: 3.4 g/dL — ABNORMAL LOW (ref 3.5–5.0)
Alkaline Phosphatase: 50 U/L (ref 38–126)
Anion gap: 13 (ref 5–15)
BUN: 7 mg/dL (ref 6–20)
CO2: 24 mmol/L (ref 22–32)
Calcium: 8.8 mg/dL — ABNORMAL LOW (ref 8.9–10.3)
Chloride: 100 mmol/L (ref 98–111)
Creatinine, Ser: 0.59 mg/dL (ref 0.44–1.00)
GFR, Estimated: >60 mL/min (ref >60)
Glucose, Bld: 100 mg/dL — ABNORMAL HIGH (ref 70–99)
Potassium: 4.1 mmol/L (ref 3.5–5.1)
Sodium: 137 mmol/L (ref 135–145)
Total Bilirubin: 0.4 mg/dL (ref 0.0–1.2)
Total Protein: 6.9 g/dL (ref 6.5–8.1)

## 2023-10-30 LAB — LIPASE, BLOOD: Lipase: 41 U/L (ref 11–51)

## 2023-10-30 LAB — LACTIC ACID, PLASMA
Lactic Acid, Venous: 2.4 mmol/L (ref 0.5–1.9)
Lactic Acid, Venous: 2.3 mmol/L (ref 0.5–1.9)
Lactic Acid, Venous: 1.4 mmol/L (ref 0.5–1.9)

## 2023-10-30 LAB — TROPONIN I (HIGH SENSITIVITY)
Troponin I (High Sensitivity): <2 ng/L (ref <18)
Troponin I (High Sensitivity): <2 ng/L (ref <18)

## 2023-10-30 MED ORDER — FLUTICASONE FUROATE-VILANTEROL 200-25 MCG/ACT IN AEPB
1.0000 | INHALATION_SPRAY | Freq: Every day | RESPIRATORY_TRACT | Status: DC
  Administered 2023-10-30 – 2023-11-02 (×4): 1 via RESPIRATORY_TRACT
  Filled 2023-10-30: qty 28

## 2023-10-30 MED ORDER — ALBUTEROL SULFATE (2.5 MG/3ML) 0.083% IN NEBU
2.5000 mg | INHALATION_SOLUTION | Freq: Four times a day (QID) | RESPIRATORY_TRACT | Status: DC | PRN
  Administered 2023-10-31: 2.5 mg via RESPIRATORY_TRACT
  Filled 2023-10-30: qty 3

## 2023-10-30 MED ORDER — LACTATED RINGERS IV BOLUS
1000.0000 mL | Freq: Once | INTRAVENOUS | Status: AC
  Administered 2023-10-30: 1000 mL via INTRAVENOUS

## 2023-10-30 MED ORDER — PANTOPRAZOLE SODIUM 40 MG IV SOLR
40.0000 mg | Freq: Two times a day (BID) | INTRAVENOUS | Status: DC
  Administered 2023-10-30 – 2023-11-01 (×5): 40 mg via INTRAVENOUS
  Filled 2023-10-30 (×5): qty 10

## 2023-10-30 MED ORDER — ONDANSETRON HCL 4 MG/2ML IJ SOLN
4.0000 mg | Freq: Four times a day (QID) | INTRAMUSCULAR | Status: DC | PRN
  Administered 2023-10-30: 4 mg via INTRAVENOUS
  Filled 2023-10-30: qty 2

## 2023-10-30 MED ORDER — ONDANSETRON HCL 4 MG/2ML IJ SOLN
4.0000 mg | Freq: Once | INTRAMUSCULAR | Status: AC
  Administered 2023-10-30: 4 mg via INTRAVENOUS
  Filled 2023-10-30: qty 2

## 2023-10-30 MED ORDER — IOHEXOL 350 MG/ML SOLN
75.0000 mL | Freq: Once | INTRAVENOUS | Status: AC | PRN
Start: 2023-10-30 — End: 2023-10-30
  Administered 2023-10-30: 75 mL via INTRAVENOUS

## 2023-10-30 MED ORDER — DIATRIZOATE MEGLUMINE & SODIUM 66-10 % PO SOLN
90.0000 mL | Freq: Once | ORAL | Status: AC
  Administered 2023-10-30: 90 mL via NASOGASTRIC
  Filled 2023-10-30: qty 90

## 2023-10-30 MED ORDER — HYDROMORPHONE HCL 1 MG/ML IJ SOLN
0.5000 mg | INTRAMUSCULAR | Status: DC | PRN
  Administered 2023-10-30 – 2023-11-01 (×5): 0.5 mg via INTRAVENOUS
  Filled 2023-10-30 (×5): qty 0.5

## 2023-10-30 MED ORDER — FLUTICASONE PROPIONATE 50 MCG/ACT NA SUSP
2.0000 | Freq: Every day | NASAL | Status: DC
  Filled 2023-10-30: qty 16

## 2023-10-30 MED ORDER — FENTANYL CITRATE PF 50 MCG/ML IJ SOSY
50.0000 ug | PREFILLED_SYRINGE | Freq: Once | INTRAMUSCULAR | Status: AC
  Administered 2023-10-30: 50 ug via INTRAVENOUS
  Filled 2023-10-30: qty 1

## 2023-10-30 MED ORDER — FENTANYL CITRATE PF 50 MCG/ML IJ SOSY
25.0000 ug | PREFILLED_SYRINGE | Freq: Once | INTRAMUSCULAR | Status: AC
  Administered 2023-10-30: 25 ug via INTRAVENOUS
  Filled 2023-10-30: qty 1

## 2023-10-30 MED ORDER — LACTATED RINGERS IV SOLN: INTRAVENOUS | Status: AC

## 2023-10-30 MED ORDER — HYDROMORPHONE HCL 1 MG/ML IJ SOLN
0.5000 mg | Freq: Once | INTRAMUSCULAR | Status: AC
  Administered 2023-10-30: 0.5 mg via INTRAVENOUS
  Filled 2023-10-30: qty 1

## 2023-10-30 MED ORDER — HEPARIN (PORCINE) 25000 UT/250ML-% IV SOLN
1450.0000 [IU]/h | INTRAVENOUS | Status: DC
  Administered 2023-10-30: 900 [IU]/h via INTRAVENOUS
  Administered 2023-10-31: 1300 [IU]/h via INTRAVENOUS
  Filled 2023-10-30 (×2): qty 250

## 2023-10-30 NOTE — Hospital Course (Addendum)
 Melissa Gaines is a 53 y.o. female with history of antiphospholipid syndrome/PE, HLD, asthma, GERD who was admitted for small bowel obstruction.  Abdominal pain  SBO Patient presented to ED with vomiting, poor p.o. intake and abdominal pain.  Initial lab work unremarkable aside from elevated lactic acid.  CT abdomen with contrast obtained which showed partial small bowel obstruction.  NG tube placed, and general surgery consulted who recommended SBO protocol with Gastrografin . NGT clamped then removed on 10/31/23. Diet progressed to GI soft on 11/02/23, and patient tolerated well. Surgery and primary team were comfortable with discharge home on 8/27.    Antiphospholipid Syndrome  Hx of PE Patient on heparin  drip inpatient while NPO. Reporting she did not take Eliquis  outpatient for the last 18 years prior to admission and will not take it when she leaves. Patient was educated on the importance of anticoagulation and risk of recurrent PE or other VTE. Will encourage continued discussion with PCP regarding risks.   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

## 2023-10-30 NOTE — Progress Notes (Signed)
 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 Throat closes and short of breath.    Patient Measurements: Height: 4' 9 (144.8 cm) Weight: 85.3 kg (188 lb) IBW/kg (Calculated) : 38.6 HEPARIN  DW (KG): 59.4  Vital Signs: Temp: 97.8 F (36.6 C) (08/24 1344) Temp Source: Oral (08/24 1344) BP: 146/80 (08/24 1344) Pulse Rate: 58 (08/24 1344)  Labs: Recent Labs    10/30/23 0808 10/30/23 1036  HGB 13.5  --   HCT 41.1  --   PLT 271  --   CREATININE 0.59  --   TROPONINIHS <2 <2    Estimated Creatinine Clearance: 75.3 mL/min (by C-G formula based on SCr of 0.59 mg/dL).   Medical History: Past Medical History:  Diagnosis Date   Anemia    Anxiety    Arthritis    Asthma    Cancer (HCC)    skin cancer   Chronic pain    Complication of anesthesia    Asthma attack during colonoscopy and EGD   Congenital hip dislocation    Depression    GERD (gastroesophageal reflux disease)    Iron deficiency anemia 04/14/2018   Kidney mass 2017   Migraine    Osteoporosis    Pathological dislocation of shoulder joint, bilateral    congential   Pneumonia    as a child   PTSD (post-traumatic stress disorder)    Sleep apnea    Hx. of no CPAP   Systemic lupus erythematosus (HCC)    Tuberculosis    as a child was treated   Urticaria     Assessment: 89 YOF presenting with abdominal pain, concern for SBO, on eliquis  PTA for antiphospholipid syndrome. Transition to heparin  while NPO for SBO eval.   Goal of Therapy:  Heparin  level 0.3-0.7 units/ml aPTT 66-102 seconds Monitor platelets by anticoagulation protocol: Yes   Plan:  Heparin  gtt at 900 units/hr, no bolus- start 1800  F/u 6 hour aPTT/HL F/u SBO workup and ability to transition back to enteral  Massie Fila, PharmD Clinical Pharmacist  10/30/2023 2:15 PM

## 2023-10-30 NOTE — Assessment & Plan Note (Addendum)
 Stable. -Heparin  while n.p.o. -Continue home Eliquis  5 mg twice a day once able to PO

## 2023-10-30 NOTE — ED Notes (Signed)
 Ng tube is now clamped and needs to remain clamped for 1 hour. This RN just administered gastrografin 

## 2023-10-30 NOTE — ED Triage Notes (Signed)
 Pt bib [facility] from home for abdominal pain. Pt reports having n/v for last two days. Pt has extreme tenderness and pain on upper abdominal quadrant that radiates to left side. Pt reports no diahrhea,  20 G lac 4mg  of zofran  given

## 2023-10-30 NOTE — Assessment & Plan Note (Addendum)
 Asthma: Continue home inhalers GERD: On 40 mg twice daily Protonix  at home, start IV until able to p.o. Vitamin D  deficiency: Restart vitamin D  supplementation once able to p.o. Migraine: Restart p.o. migraine medication once able to p.o. SLE: Follows with [facility], does not appear to be taking active DMARDs. Prior history of Plaquenil /prednisone  use. Hyperlipidemia: Restart Zetia  and atorvastatin  once able to p.o. History of hematuria/renal cyst: Looks like patient was seen on 07/23/2020 by [facility] urology-Kate Debarah, was treated for trichomonas and supposed to follow-up but does not appear that she has done so.  She was supposed to have repeat labs, if migraine was present they would do hematuria workup.  Do not believe this is related to her current presentation, but will put on follow-up recommendations.

## 2023-10-30 NOTE — Plan of Care (Signed)
 FMTS Brief Progress Note  S: Pain is somewhat improved per patient. States she is able to fall asleep but experiences pain upon waking. No bowel movement since yesterday morning, states she may have passed some gas today. Denies N/V.  O: BP 116/68   Pulse (!) 59   Temp 98 F (36.7 C)   Resp 18   Ht 4' 9 (1.448 m)   Wt 85.3 kg   SpO2 (!) 89%   BMI 40.68 kg/m    General: Laying in bed, NAD.  Cardiovascular: RRR, S1/S2 normal. No murmurs, rubs, or gallops appreciated. Pulmonary: Normal work of breathing. CTAB with no wheezes or crackles present Abdomen: Bowel sounds present. Soft, TTP in the upper quadrants  A/P: Small bowl obstruction  Subjectively improving with some output observed. Benign exam and pain seems to be adequately controlled. Will reevaluate if patient clinically changes and consult surgery as needed. - Continue IV Dilaudid  0.5mg  q2hrs prn - Continue NG tube  Jerrie Gathers, DO 10/30/2023, 8:40 PM PGY-1, [facility] Family Medicine Night Resident  Please page [phone] with questions.

## 2023-10-30 NOTE — ED Provider Notes (Signed)
 [place] [facility] AT [facility] Provider Note   CSN: [id number] Arrival date & time: 10/30/23  9255     Patient presents with: Abdominal Pain, Nausea, and Emesis   Melissa Gaines is a 53 y.o. female.   53 year old presenting with abdominal pain.  Reports pain for the past 2 days, has been largely been in the upper quadrants.  Was on the left side, then on right side now epigastrium.  Nonradiating.  Last bowel movements this morning normal.  Nonbloody nonbilious emesis.     Abdominal Pain Associated symptoms: vomiting   Emesis Associated symptoms: abdominal pain        Prior to Admission medications   Medication Sig Start Date End Date Taking? Authorizing Provider  acetaminophen  (TYLENOL ) 500 MG tablet Take 500 mg by mouth every 6 (six) hours as needed for headache.   Yes [provider]  acetaminophen -codeine (TYLENOL  #3) 300-30 MG tablet Take 1 tablet by mouth 2 (two) times daily as needed for moderate pain (pain score 4-6).   Yes [provider]  albuterol  (VENTOLIN  HFA) 108 (90 Base) MCG/ACT inhaler Inhale 2 puffs into the lungs every 6 (six) hours as needed for wheezing or shortness of breath. 11/18/21  Yes Pray, Rollene BRAVO, MD  azelastine  (ASTELIN ) 0.1 % nasal spray Place 2 sprays into both nostrils 2 (two) times daily. Use in each nostril as directed 08/06/22  Yes Lorin Norris, MD  budesonide -formoterol  (SYMBICORT ) 160-4.5 MCG/ACT inhaler Inhale 2 puffs into the lungs daily. Patient taking differently: Inhale 2 puffs into the lungs daily as needed (for shortness of breath). 07/01/21  Yes Henriette Mora, MD  cetirizine  (ZYRTEC ) 10 MG tablet Take 1 tablet (10 mg total) by mouth daily. 09/25/21  Yes Lorin Norris, MD  cromolyn  (OPTICROM ) 4 % ophthalmic solution Place 1 drop into both eyes 4 (four) times daily. 04/29/23  Yes Lorin Norris, MD  cyanocobalamin  (,VITAMIN B-12,) 1000 MCG/ML injection Inject 1 mL  (1,000 mcg total) into the skin every 30 (thirty) days. 10/25/18  Yes Burton, Lacie K, NP  cycloSPORINE  (RESTASIS ) 0.05 % ophthalmic emulsion Place 1 drop into both eyes 2 (two) times daily. 12/04/19  Yes Henriette Mora, MD  diclofenac  Sodium (VOLTAREN  ARTHRITIS PAIN) 1 % GEL Apply 4 g topically 4 (four) times daily. Patient taking differently: Apply 4 g topically daily as needed (for pain). 02/25/23  Yes Christia Budds, MD  ELIQUIS  5 MG TABS tablet TAKE 1 TABLET BY MOUTH TWICE  DAILY NEEDS PCP APPOINTMENT  BEFORE MORE FILLS 03/14/23  Yes Christia Budds, MD  Erenumab -aooe (AIMOVIG ) 140 MG/ML SOAJ Inject 140 mg into the skin every 30 (thirty) days. 05/17/23  Yes Jaffe, Adam R, DO  ezetimibe  (ZETIA ) 10 MG tablet Take 1 tablet (10 mg total) by mouth daily. 09/15/23 12/14/23 Yes Alba Sharper, MD  famotidine  (PEPCID ) 20 MG tablet TAKE 1 TABLET BY MOUTH TWICE  DAILY 03/21/23  Yes Lorin Norris, MD  ferrous sulfate  325 (65 FE) MG tablet Take 1 tablet (325 mg total) by mouth 3 (three) times daily with meals. 12/03/20  Yes Burton, Lacie K, NP  FLUoxetine  (PROZAC ) 20 MG capsule Take 1 capsule (20 mg total) by mouth daily. 09/22/23 12/21/23 Yes Arfeen, Leni DASEN, MD  fluticasone  (FLONASE ) 50 MCG/ACT nasal spray Place 2 sprays into both nostrils daily. 07/31/21  Yes Henriette Mora, MD  gabapentin  (NEURONTIN ) 400 MG capsule Take 1 capsule (400 mg total) by mouth at bedtime. 09/22/23  Yes Arfeen, Leni DASEN, MD  levocetirizine (  XYZAL ) 5 MG tablet TAKE 1 TABLET BY MOUTH EVERY  EVENING 09/28/23  Yes Cheryl Reusing, FNP  pantoprazole  (PROTONIX ) 40 MG tablet Take 40 mg by mouth daily as needed (acid reflux). 10/04/23  Yes [provider]  traZODone  (DESYREL ) 100 MG tablet Take 1 tablet (100 mg total) by mouth at bedtime. 09/22/23  Yes Arfeen, Leni DASEN, MD  Ubrogepant  (UBRELVY ) 100 MG TABS Take 1 tablet (100 mg total) by mouth as needed (May repeat in 2 hours.  Maximum 2 tablets in 24 hours). 05/17/23  Yes Jaffe, Adam  R, DO  Vitamin D , Ergocalciferol , (DRISDOL ) 50000 units CAPS capsule Take 1 capsule (50,000 Units total) by mouth every 7 (seven) days. 12/16/16  Yes Yoo, Elsia J, DO  Vonoprazan Fumarate  (VOQUEZNA ) 10 MG TABS Take 10 mg by mouth daily. Lot: [id number], Exp 05-2024 Patient taking differently: Take 10 mg by mouth daily. 09/16/23  Yes Armbruster, Elspeth SQUIBB, MD  Zoster Vaccine Adjuvanted (SHINGRIX ) injection Administer Shingrix  vaccination now and repeat in two months 10/17/23  Yes Alba Sharper, MD  atorvastatin  (LIPITOR) 80 MG tablet Take 1 tablet (80 mg total) by mouth daily. 03/03/23   Joshua Domino, DO  dexamethasone  (DECADRON ) 1 MG tablet Take 1 mg by mouth once. Patient not taking: Reported on 10/30/2023 10/21/23   [provider]  EPINEPHrine  0.3 mg/0.3 mL IJ SOAJ injection INJECT INTRAMUSCULARLY 1 PEN AS  NEEDED FOR ALLERGIC RESPONSE AS  DIRECTED BY MD. [facility]  [facility] AFTER USE. 08/30/22   Cheryl Reusing, FNP  Na Sulfate-K Sulfate-Mg Sulfate concentrate (SUPREP BOWEL PREP KIT) 17.5-3.13-1.6 GM/177ML SOLN Take 1 kit (354 mLs total) by mouth as directed. For colonoscopy prep Patient not taking: Reported on 10/30/2023 07/26/23   Cara Elida HERO, NP  SYRINGE/NEEDLE, DISP, 1 ML 25G X 5/8 1 ML MISC Match with B12 10/25/18   Burton, Lacie K, NP    Allergies: Fish allergy     Review of Systems  Gastrointestinal:  Positive for abdominal pain and vomiting.    Updated Vital Signs BP (!) 146/80   Pulse (!) 58   Temp 97.8 F (36.6 C) (Oral)   Resp (!) 23   Ht 4' 9 (1.448 m)   Wt 85.3 kg   SpO2 97%   BMI 40.68 kg/m   Physical Exam Vitals and nursing note reviewed.  Constitutional:      General: She is not in acute distress.    Appearance: She is obese. She is not toxic-appearing.  HENT:     Head: Normocephalic.  Cardiovascular:     Rate and Rhythm: Normal rate and regular rhythm.  Pulmonary:     Effort: Pulmonary effort is normal.     Breath sounds: Normal breath  sounds.  Abdominal:     General: Abdomen is flat.     Palpations: Abdomen is soft.     Tenderness: There is abdominal tenderness in the epigastric area. There is no guarding or rebound.  Skin:    General: Skin is warm and dry.     Capillary Refill: Capillary refill takes less than 2 seconds.  Neurological:     General: No focal deficit present.     Mental Status: She is alert.  Psychiatric:        Mood and Affect: Mood normal.        Behavior: Behavior normal.     (all labs ordered are listed, but only abnormal results are displayed) Labs Reviewed  COMPREHENSIVE METABOLIC PANEL WITH GFR - Abnormal; Notable for the  following components:      Result Value   Glucose, Bld 100 (*)    Calcium  8.8 (*)    Albumin 3.4 (*)    All other components within normal limits  LACTIC ACID, PLASMA - Abnormal; Notable for the following components:   Lactic Acid, Venous 2.4 (*)    All other components within normal limits  LACTIC ACID, PLASMA - Abnormal; Notable for the following components:   Lactic Acid, Venous 2.3 (*)    All other components within normal limits  CBC WITH DIFFERENTIAL/PLATELET  LIPASE, BLOOD  LACTIC ACID, PLASMA  APTT  HEPARIN  LEVEL (UNFRACTIONATED)  TROPONIN I (HIGH SENSITIVITY)  TROPONIN I (HIGH SENSITIVITY)    EKG: EKG Interpretation Date/Time:  Sunday October 30 2023 08:16:29 EDT Ventricular Rate:  69 PR Interval:  167 QRS Duration:  89 QT Interval:  401 QTC Calculation: 430 R Axis:   3  Text Interpretation: Sinus rhythm Borderline low voltage, extremity leads Confirmed by Neysa Clap [phone]) on 10/30/2023 9:22:20 AM  Radiology: [facility] Abd Portable 1 View Result Date: 10/30/2023 EXAM: 1 VIEW XRAY OF THE ABDOMEN 10/30/2023 12:14:00 PM COMPARISON: Early film of the same day. CLINICAL HISTORY: Check NG tube placement. FINDINGS: BOWEL: A few distended small bowel loops in the left mid abdomen. SOFT TISSUES: No opaque urinary calculi. BONES: No acute osseous  abnormality. LINES AND TUBES: Gastric tube seen to the fundus of the decompressed stomach. IMPRESSION: 1. Gastric tube in appropriate position. 2. A few distended small bowel loops in the left mid abdomen. No bowel obstruction. Electronically signed by: Katheleen Faes MD 10/30/2023 12:51 PM EDT RP Workstation: [id number]   DG Abd Portable 1V-Small Bowel Protocol-Position Verification Result Date: 10/30/2023 EXAM: 1 VIEW XRAY OF THE ABDOMEN 10/30/2023 12:12:00 PM COMPARISON: CT from earlier the same day. CLINICAL HISTORY: Encounter for imaging study to confirm nasogastric (NG) tube placement. FINDINGS: BOWEL: A few distended mid abdominal small bowel loops. Nonobstructive bowel gas pattern. SOFT TISSUES: No opaque urinary calculi. BONES: No acute osseous abnormality. LINES AND TUBES: Nasogastric tube with tip near the gastroesophageal junction. IMPRESSION: 1. Decompressed stomach with tip of the nasogastric tube near the GE junction. 2. A few distended mid abdominal small bowel loops. No bowel obstruction. Electronically signed by: Katheleen Faes MD 10/30/2023 12:50 PM EDT RP Workstation: [id number]   CT ABDOMEN PELVIS W CONTRAST Result Date: 10/30/2023 EXAM: CT ABDOMEN AND PELVIS WITH CONTRAST 10/30/2023 09:57:33 AM TECHNIQUE: CT of the abdomen and pelvis was performed with the administration of intravenous contrast. Multiplanar reformatted images are provided for review. Automated exposure control, iterative reconstruction, and/or weight-based adjustment of the mA/kV was utilized to reduce the radiation dose to as low as reasonably achievable. COMPARISON: None available. CLINICAL HISTORY: Bowel obstruction suspected. FINDINGS: LOWER CHEST: Mild dependent atelectasis in the lung bases. LIVER: Normal size and contour. GALLBLADDER AND BILE DUCTS: No wall thickening. No cholelithiasis. No biliary ductal dilatation. SPLEEN: Normal size. No focal lesion. PANCREAS: No mass. No ductal dilatation. ADRENAL GLANDS: Normal  appearance. No mass. KIDNEYS, URETERS AND BLADDER: No stones in the kidneys or ureters. No hydronephrosis. No perinephric or periureteral stranding. Urinary bladder is unremarkable. GI AND BOWEL: Moderate hiatal hernia, post gastric surgery. Proximal small bowel decompressed. A few fluid distended mid abdominal small bowel loops with fecalization of a loop in the right central pelvis, beyond which there is abrupt transition to decompressed bowel. PERITONEUM AND RETROPERITONEUM: There is a trace adjacent amount of free fluid in the right pelvis without evident loculation or  peripheral enhancement. No free air. VASCULATURE: Aorta is normal in caliber. LYMPH NODES: No lymphadenopathy. REPRODUCTIVE ORGANS: No significant abnormality. BONES AND SOFT TISSUES: Bilateral hip arthroplasty. Mild lumbar dextroscoliosis. No acute osseous abnormality. No focal soft tissue abnormality. IMPRESSION: 1. Early partial mid Small bowel obstruction with transition point in the right central pelvis, no mass or evident etiology suggesting adhesion. 2. Trace free fluid in the right pelvis without evident loculation or peripheral enhancement. 3. Moderate hiatal hernia, post gastric surgery. Electronically signed by: Katheleen Faes MD 10/30/2023 10:37 AM EDT RP Workstation: [id number]   DG Chest Portable 1 View Result Date: 10/30/2023 CLINICAL DATA:  Fall. EXAM: PORTABLE CHEST 1 VIEW COMPARISON:  None Available. FINDINGS: Low volume lordotic film. The lungs are clear without focal pneumonia, edema, pneumothorax or pleural effusion. Cardiopericardial silhouette is at upper limits of normal for size. No acute bony abnormality. Oblique linear lucency through the proximal humerus is probably artifact IMPRESSION: 1. Low volume lordotic film without acute cardiopulmonary findings. 2. Oblique linear lucency through the proximal humerus is probably artifact. Correlation for point tenderness recommended. If there is concern for proximal humerus  fracture, dedicated left shoulder films recommended. Electronically Signed   By: Camellia Candle M.D.   On: 10/30/2023 08:21     Procedures   Medications Ordered in the ED  lactated ringers  infusion ( Intravenous New Bag/Given 10/30/23 1354)  albuterol  (PROVENTIL ) (2.5 MG/3ML) 0.083% nebulizer solution 2.5 mg (has no administration in time range)  fluticasone  furoate-vilanterol (BREO ELLIPTA ) 200-25 MCG/ACT 1 puff (has no administration in time range)  fluticasone  (FLONASE ) 50 MCG/ACT nasal spray 2 spray (has no administration in time range)  pantoprazole  (PROTONIX ) injection 40 mg (40 mg Intravenous Given 10/30/23 1444)  heparin  ADULT infusion 100 units/mL (25000 units/250mL) (has no administration in time range)  ondansetron  (ZOFRAN ) injection 4 mg (has no administration in time range)  fentaNYL  (SUBLIMAZE ) injection 25 mcg (25 mcg Intravenous Given 10/30/23 0811)  fentaNYL  (SUBLIMAZE ) injection 50 mcg (50 mcg Intravenous Given 10/30/23 0907)  iohexol  (OMNIPAQUE ) 350 MG/ML injection 75 mL (75 mLs Intravenous Contrast Given 10/30/23 0959)  lactated ringers  bolus 1,000 mL (1,000 mLs Intravenous New Bag/Given 10/30/23 1035)  diatrizoate  meglumine -sodium (GASTROGRAFIN ) 66-10 % solution 90 mL (90 mLs Per NG tube Given 10/30/23 1317)  ondansetron  (ZOFRAN ) injection 4 mg (4 mg Intravenous Given 10/30/23 1213)  HYDROmorphone  (DILAUDID ) injection 0.5 mg (0.5 mg Intravenous Given 10/30/23 1157)    Clinical Course as of 10/30/23 1456  Sun Oct 30, 2023  1022 CBC with Differential No leukocytosis to suggest infectious process [TY]  1022 Comprehensive metabolic panel(!) No significant metabolic derangements.  Hyperglycemic, but not DKA.  Normal kidney function.  No transaminitis or elevated bilirubin to suggest acute hepatobiliary disease [TY]  1023 Lipase: 41 Acute pancreatitis less likely [TY]  1023 Troponin I (High Sensitivity): <2 ACS less likely [TY]  1023 Lactic Acid, Venous(!!): 2.4 Receiving IV  fluids.  Secondary to nausea vomiting? [TY]  1130 CT ABDOMEN PELVIS W CONTRAST IMPRESSION: 1. Early partial mid Small bowel obstruction with transition point in the right central pelvis, no mass or evident etiology suggesting adhesion. 2. Trace free fluid in the right pelvis without evident loculation or peripheral enhancement. 3. Moderate hiatal hernia, post gastric surgery.  Electronically signed by: Dayne Hassell MD 10/30/2023 10:37 AM EDT RP Workstation: [id number]   [TY]  1206 Spoke with family medicine who will admit patient.  Requesting surgery consult. [TY]  1212 Spoke with surgery; will evaluate patient and give further recs [  TY]    Clinical Course User Index [TY] Neysa Caron PARAS, DO                                 Medical Decision Making Is a 53 year old female presenting emergency department for abdominal pain.  EMS reported stable vitals and getting Zofran  prior to arrival.  Does have a complicated past medical history to include lupus chronic pain, GERD and skin cancer.  Per chart review does have a history of laparoscopic gastric sleeve and prior C-sections.  Does not appear to be in acute distress, but does appear uncomfortable.  IV narcotics ordered for pain.  Will get screening labs and CT scan given her prior surgical history with concern for possible obstruction as she has been having persistent nausea vomiting.  See course for further MDM and final disposition  Amount and/or Complexity of Data Reviewed Independent Historian: EMS    Details: Gave Zofran  prior to arrival; patient with improvement. External Data Reviewed:     Details: Appears to be taking Eliquis , no recent CT scans of her abdomen Labs: ordered. Decision-making details documented in ED Course. Radiology: ordered and independent interpretation performed. Decision-making details documented in ED Course. ECG/medicine tests: ordered and independent interpretation performed. Decision-making details  documented in ED Course.  Risk Prescription drug management. Parenteral controlled substances. Decision regarding hospitalization. Diagnosis or treatment significantly limited by social determinants of health.        Final diagnoses:  Small bowel obstruction [facility])    ED Discharge Orders     None          Neysa Caron PARAS, DO 10/30/23 1456

## 2023-10-30 NOTE — H&P (Cosign Needed Addendum)
 Hospital Admission History and Physical Service Pager: [phone]  Patient name: Melissa Gaines Medical record number: [id number] Date of Birth: [birth year] Age: 53 y.o. Gender: female  Primary Care Provider: Alba Sharper, MD Consultants: General surgery Code Status: FULL Preferred Emergency Contact:  Contact Information     Name Relation Home Work Mobile   Ortiz-Rodriguez,Francheska Daughter [phone]  [phone]   Vivi Christopher Burrows [phone]     Margarette Linwood Ahumada   [phone]      Other Contacts   None on File     Chief Complaint: Abdominal pain  Assessment and Plan: Melissa Gaines is a 53 y.o. female presenting with abdominal pain and found to have early partial small bowel obstruction on CT imaging.  Of note, on 09/15/2023 she had EGD/colonoscopy for dysphagia/GERD/colon screening with GI showing tortuous distal esophagus (empiric dilation performed) and hiatal hernia - GI recommended consideration of hiatal hernia repair if she did not improve.  Colonoscopy showed multiple polyps that were resected, and a restricted sigmoid colon that required use of ultraslim pediatric colonoscope. At this time pain is most likely related to SBO, but will keep her hiatal hernia and tortuous esophagus in mind. Assessment & Plan Small bowel obstruction (HCC) Early partial SBO found on CT imaging. continuous nausea & vomiting. last BM this a.m. - Admit to MedSurg, Dr. Anders attending - General Surgery consulted, appreciate recommendations - NG-tube placed for bowel decompression - NPO - IVF 100 mL/h LR - IV zofran  4 mg as needed - Protonix  40 mg IV every 12 hours - Gastrografin  ordered - 0.5 q2h Dilaudid  as needed for pain Antiphospholipid antibody syndrome (HCC) Stable. -Heparin  while n.p.o. -Continue home Eliquis  5 mg twice a day once able to PO Chronic health problem Asthma: Continue home inhalers GERD: On 40 mg twice daily  Protonix  at home, start IV until able to p.o. Vitamin D  deficiency: Restart vitamin D  supplementation once able to p.o. Migraine: Restart p.o. migraine medication once able to p.o. SLE: Follows with [facility], does not appear to be taking active DMARDs. Prior history of Plaquenil /prednisone  use. Hyperlipidemia: Restart Zetia  and atorvastatin  once able to p.o. History of hematuria/renal cyst: Looks like patient was seen on 07/23/2020 by [facility] u[facility]-Kate Debarah, was treated for trichomonas and supposed to follow-up but does not appear that she has done so.  She was supposed to have repeat labs, if migraine was present they would do hematuria workup.  Do not believe this is related to her current presentation, but will put on follow-up recommendations.  FEN/GI: NPO VTE Prophylaxis: heparin   Disposition: Home pending clinical improvement  History of Present Illness:  Melissa Gaines is a 53 y.o. female presenting with abdominal pain for the past 2 days, with worsening pain yesterday She endorses nausea and several bouts of non-bilious vomiting. She endorses normal BMs, last BM was today. Pt has been having a hard time with eating, endorsing early satiety and only being able to eat small amounts at a time. Denies food getting stuck in her throat. She has been unable to eat/drink for the past 24h given her nausea. She continue to have bouts of emesis in the ED.  In the ED, patient was given IV Zofran  NG tube placed for decompression. CBC, CMP, lipase, and troponins ordered in the ED and all WNL. Lactic acid was elevated in the ED at 2.4, repeat lactic acid 4 hours later is 2.3.  CT abdomen was performed and showed early  partial obstruction and general surgery was consulted.  Patient received IV fentanyl  in the ED for pain, and transition to IV Dilaudid  as needed for pain by FMTS.  Review Of Systems: Per HPI with the following additions: denies hemoptysis or  hematochezia  Pertinent Past Medical History: Hiatal hernia Gastric sleeve GERD SLE Remainder reviewed in history tab.   Pertinent Past Surgical History: 2 cesarean sections Gastric bypass Remainder reviewed in history tab.  Pertinent Social History: Tobacco use: Denies Alcohol use: Denies Other Substance use: Denies Lives with her children  Pertinent Family History: none Remainder reviewed in history tab.   Important Outpatient Medications: Albuterol  2 puffs every 6 hours as needed Flonase  2 sprays daily Symbicort  2 puffs into the lungs daily Lipitor 80 mg daily Astelin  0.1% nasal spray 2 sprays 2 times daily Zyrtec  10 mg daily Cromolyn  4% 1 drop into eyes 4 times daily Cyclosporine  0.05% one drop into both eyes 2 times a day Dexamethasone  1 mg daily Diclofenac  1% gel 4 times daily Eliquis  5 mg twice a day Aimovig  140 mg once a month Zetia  10 mg daily Famotidine  20 mg twice daily Vitamin D  50,000 units every 7 days B12 1000mL injection every month Oral iron 325 mg 3 times daily Prozac  20 mg daily Gabapentin  400 mg at bedtime Xyzal  5 mg at night Pantoprazole  40 mg 2 times a day Trazodone  100 mg at bedtime Ubrelvy  100 mg 1 tablet as needed Voquenza 10 mg daily Remainder reviewed in medication history.   Objective: BP 124/79   Pulse 61   Temp 98 F (36.7 C) (Oral)   Resp (!) 23   Ht 4' 9 (1.448 m)   Wt 85.3 kg   SpO2 99%   BMI 40.68 kg/m  Exam: General: Laying in bed, uncomfortable, NAD, daughter at bedside Cardiovascular: RRR, no m/r/g Respiratory: Normal work of breathing on room air Gastrointestinal: Bowel sounds not present in LLQ, however normal clicks and gurgles in bilateral upper quadrants. NTTP, soft Neuro: A&ox3   Labs:  CBC BMET  Recent Labs  Lab 10/30/23 0808  WBC 7.8  HGB 13.5  HCT 41.1  PLT 271   Recent Labs  Lab 10/30/23 0808  NA 137  K 4.1  CL 100  CO2 24  BUN 7  CREATININE 0.59  GLUCOSE 100*  CALCIUM  8.8*     - EKG: My own interpretation: normal sinus rhythm  Imaging Studies Performed:  Chest x-ray: Radiologist impression: 1. Low volume lordotic film without acute cardiopulmonary findings. 2. Oblique linear lucency through the proximal humerus is probably artifact. Correlation for point tenderness recommended. If there is concern for proximal humerus fracture, dedicated left shoulder films recommended.  Abdominal x-ray: Radiologist impression: 1. Gastric tube in appropriate position. 2. A few distended small bowel loops in the left mid abdomen. No bowel obstruction.  CT abdomen pelvis with contrast: Radiologist impression: 1. Early partial mid Small bowel obstruction with transition point in the right central pelvis, no mass or evident etiology suggesting adhesion. 2. Trace free fluid in the right pelvis without evident loculation or peripheral enhancement. 3. Moderate hiatal hernia, post gastric surgery.  Abdominal x-ray-SBO protocol: Radiologist impression: 1. Decompressed stomach with tip of the nasogastric tube near the GE junction. 2. A few distended mid abdominal small bowel loops. No bowel obstruction.  Idelle Nakai, DO 10/30/2023, 12:59 PM PGY-1, [facility]  FPTS Intern pager: [phone], text pages welcome Secure chat group [facility] Teaching Service   Upper Level Addendum:  I have seen and evaluated this patient along with Dr. Idelle and reviewed the above note, making necessary revisions as appropriate. I agree with the medical decision making and physical exam as noted above. Gladis Church, DO PGY-3 [facility] Family Medicine Residency

## 2023-10-30 NOTE — Plan of Care (Signed)
 Lactic acid remains elevated.  Will give second LR bolus, 1 L.

## 2023-10-30 NOTE — Assessment & Plan Note (Addendum)
 Early partial SBO found on CT imaging. continuous nausea & vomiting. last BM this a.m. - Admit to MedSurg, Dr. Anders attending - General Surgery consulted, appreciate recommendations - NG-tube placed for bowel decompression - NPO - IVF 100 mL/h LR - IV zofran  4 mg as needed - Protonix  40 mg IV every 12 hours - Gastrografin  ordered - 0.5 q2h Dilaudid  as needed for pain

## 2023-10-30 NOTE — Consult Note (Signed)
 Reason for Consult:abdominal pain Referring Provider: Caron Neysa Howard Tameca Jerez is an 53 y.o. female.  HPI: 53 yo female with 3 days of abdominal pain. Pain is diffuse but felt like stabbing pain in the upper abdomen overnight. She has been having normal bowel movements and flatus. Last bowel movement was this morning.  Past Medical History:  Diagnosis Date   Anemia    Anxiety    Arthritis    Asthma    Cancer (HCC)    skin cancer   Chronic pain    Complication of anesthesia    Asthma attack during colonoscopy and EGD   Congenital hip dislocation    Depression    GERD (gastroesophageal reflux disease)    Iron deficiency anemia 04/14/2018   Kidney mass 2017   Migraine    Osteoporosis    Pathological dislocation of shoulder joint, bilateral    congential   Pneumonia    as a child   PTSD (post-traumatic stress disorder)    Sleep apnea    Hx. of no CPAP   Systemic lupus erythematosus (HCC)    Tuberculosis    as a child was treated   Urticaria     Past Surgical History:  Procedure Laterality Date   CESAREAN SECTION  2005   CESAREAN SECTION W/BTL  2010   COLONOSCOPY N/A 09/15/2023   Procedure: COLONOSCOPY;  Surgeon: Leigh Elspeth SQUIBB, MD;  Location: [facility];  Service: Gastroenterology;  Laterality: N/A;   ESOPHAGOGASTRODUODENOSCOPY N/A 09/15/2023   Procedure: EGD (ESOPHAGOGASTRODUODENOSCOPY);  Surgeon: Leigh Elspeth SQUIBB, MD;  Location: [facility];  Service: Gastroenterology;  Laterality: N/A;   HIP SURGERY     in childhood for congenital hip dislocation   LAPAROSCOPIC GASTRIC SLEEVE RESECTION  2014   POLYPECTOMY  09/15/2023   Procedure: POLYPECTOMY, INTESTINE;  Surgeon: Leigh Elspeth SQUIBB, MD;  Location: [facility];  Service: Gastroenterology;;   HARLEY DILATION N/A 09/15/2023   Procedure: EGD, WITH DILATION USING SAVARY-GILLIARD DILATOR OVER GUIDEWIRE;  Surgeon: Leigh Elspeth SQUIBB, MD;  Location: [facility];  Service:  Gastroenterology;  Laterality: N/A;   STAPEDECTOMY  11/01/2011   L postauricular stapedectomy with CO2 laser and insertion of 6 x 4.75 mm SMart Piston   TOTAL HIP ARTHROPLASTY     05/2000 R, 11/2000 L    Family History  Problem Relation Age of Onset   Asthma Mother    Liver cancer Mother    Heart Problems Mother        heart attack and heart failure   Kidney Stones Mother    Osteoporosis Mother    Stroke Mother    Depression Mother    Anxiety disorder Mother    Kidney Stones Father    Hernia Father    Kidney disease Father    Depression Sister    Anxiety disorder Sister    Asthma Sister    Lung cancer Maternal Grandmother    Rectal cancer Maternal Grandfather    Skin cancer Paternal Grandmother        mets to stomach   Mental illness Half-Sister    Cancer Other    ADD / ADHD Son    Bipolar disorder Daughter     Social History:  reports that she has never smoked. She has been exposed to tobacco smoke. She has never used smokeless tobacco. She reports that she does not currently use alcohol. She reports that she does not use drugs.  Allergies:  Allergies  Allergen Reactions   Fish Allergy   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 Throat closes and short of breath.    Medications: I have reviewed the patient's current medications.  Results for orders placed or performed during the hospital encounter of 10/30/23 (from the past 48 hours)  CBC with Differential     Status: None   Collection Time: 10/30/23  8:08 AM  Result Value Ref Range   WBC 7.8 4.0 - 10.5 K/uL   RBC 4.71 3.87 - 5.11 MIL/uL   Hemoglobin 13.5 12.0 - 15.0 g/dL   HCT 58.8 63.9 - 53.9 %   MCV 87.3 80.0 - 100.0 fL   MCH 28.7 26.0 - 34.0 pg   MCHC 32.8 30.0 - 36.0 g/dL   RDW 86.7 88.4 - 84.4 %   Platelets 271 150 - 400 K/uL   nRBC 0.0 0.0 - 0.2 %   Neutrophils Relative % 73 %   Neutro Abs 5.7 1.7 - 7.7 K/uL   Lymphocytes Relative 22 %   Lymphs Abs 1.7 0.7 - 4.0 K/uL   Monocytes Relative 3 %   Monocytes  Absolute 0.2 0.1 - 1.0 K/uL   Eosinophils Relative 1 %   Eosinophils Absolute 0.1 0.0 - 0.5 K/uL   Basophils Relative 0 %   Basophils Absolute 0.0 0.0 - 0.1 K/uL   Immature Granulocytes 1 %   Abs Immature Granulocytes 0.04 0.00 - 0.07 K/uL    Comment: Performed at [facility], [address], [place], [place] [address]  Comprehensive metabolic panel     Status: Abnormal   Collection Time: 10/30/23  8:08 AM  Result Value Ref Range   Sodium 137 135 - 145 mmol/L   Potassium 4.1 3.5 - 5.1 mmol/L   Chloride 100 98 - 111 mmol/L   CO2 24 22 - 32 mmol/L   Glucose, Bld 100 (H) 70 - 99 mg/dL    Comment: Glucose reference range applies only to samples taken after fasting for at least 8 hours.   BUN 7 6 - 20 mg/dL   Creatinine, Ser 9.40 0.44 - 1.00 mg/dL   Calcium  8.8 (L) 8.9 - 10.3 mg/dL   Total Protein 6.9 6.5 - 8.1 g/dL   Albumin 3.4 (L) 3.5 - 5.0 g/dL   AST 17 15 - 41 U/L   ALT 12 0 - 44 U/L   Alkaline Phosphatase 50 38 - 126 U/L   Total Bilirubin 0.4 0.0 - 1.2 mg/dL   GFR, Estimated >39 >39 mL/min    Comment: (NOTE) Calculated using the CKD-EPI Creatinine Equation (2021)    Anion gap 13 5 - 15    Comment: Performed at [facility], [address] [place], [place] [address]  Lipase, blood     Status: None   Collection Time: 10/30/23  8:08 AM  Result Value Ref Range   Lipase 41 11 - 51 U/L    Comment: Performed at [facility], [address] [place], [place] [address]  Troponin I (High Sensitivity)     Status: None   Collection Time: 10/30/23  8:08 AM  Result Value Ref Range   Troponin I (High Sensitivity) <2 <18 ng/L    Comment: (NOTE) Elevated high sensitivity troponin I (hsTnI) values and significant  changes across serial measurements may suggest ACS but many other  chronic and acute conditions are known to elevate hsTnI results.  Refer to the Links section for chest pain algorithms and additional  guidance. Performed at [facility], [address] [place], [place] [address]   Lactic acid, plasma     Status: Abnormal   Collection Time: 10/30/23  8:08 AM  Result Value Ref Range   Lactic Acid, Venous 2.4 (HH) 0.5 - 1.9 mmol/L    Comment: CRITICAL RESULT CALLED TO, READ BACK BY AND VERIFIED WITH CARICO.CHASITY RN @ [phone] 10/30/23 GORMAN ANTES Performed at [facility], [address] [place], [place] [address]   Troponin I (High Sensitivity)     Status: None   Collection Time: 10/30/23 10:36 AM  Result Value Ref Range   Troponin I (High Sensitivity) <2 <18 ng/L    Comment: (NOTE) Elevated high sensitivity troponin I (hsTnI) values and significant  changes across serial measurements may suggest ACS but many other  chronic and acute conditions are known to elevate hsTnI results.  Refer to the Links section for chest pain algorithms and additional  guidance. Performed at [facility], [address] [place], [place] [address]   Lactic acid, plasma     Status: Abnormal   Collection Time: 10/30/23 12:14 PM  Result Value Ref Range   Lactic Acid, Venous 2.3 (HH) 0.5 - 1.9 mmol/L    Comment: CRITICAL VALUE NOTED. VALUE IS CONSISTENT WITH PREVIOUSLY REPORTED/CALLED VALUE Performed at [facility], [address], [place], [place] [address]     PE Blood pressure 124/79, pulse 61, temperature 98.1 F (36.7 C), temperature source Oral, resp. rate (!) 23, height 4' 9 (1.448 m), weight 85.3 kg, SpO2 99%. Constitutional: NAD; conversant; no deformities Eyes: Moist conjunctiva; no lid lag; anicteric; PERRL Neck: Trachea midline; no thyromegaly Lungs: Normal respiratory effort; no tactile fremitus CV: RRR; no palpable thrills; no pitting edema GI: Abd soft, slight tenderness in the periumbilical area; no palpable hepatosplenomegaly MSK: Normal gait; no clubbing/cyanosis Psychiatric: Appropriate affect; alert and oriented x3 Lymphatic: No palpable cervical or axillary lymphadenopathy Skin: No major subcutaneous  nodules. Warm and dry   Assessment/Plan: 53 yo female with history of c section, tubal ligation, and sleeve gastrectomy. I reviewed CT scan images showing dilation of small intestine with fecalization just before a transition point in the pelvis abutting the uterus. I do not see a mesenteric twisting. I agree with plan for NG tube insertion and protocol for possible SBO vs pSBO.  I reviewed last 24 h vitals and pain scores, last 48 h intake and output, last 24 h labs and trends, and last 24 h imaging results.  This care required moderate level of medical decision making.   Herlene Righter Kawanna Christley 10/30/2023, 1:39 PM

## 2023-10-31 ENCOUNTER — Inpatient Hospital Stay (HOSPITAL_COMMUNITY)

## 2023-10-31 DIAGNOSIS — K56609 Unspecified intestinal obstruction, unspecified as to partial versus complete obstruction: Secondary | ICD-10-CM | POA: Diagnosis not present

## 2023-10-31 LAB — BASIC METABOLIC PANEL WITH GFR
Anion gap: 8 (ref 5–15)
Anion gap: 5 (ref 5–15)
BUN: <5 mg/dL — ABNORMAL LOW (ref 6–20)
BUN: <5 mg/dL — ABNORMAL LOW (ref 6–20)
CO2: 27 mmol/L (ref 22–32)
CO2: 28 mmol/L (ref 22–32)
Calcium: 8.6 mg/dL — ABNORMAL LOW (ref 8.9–10.3)
Calcium: 8.4 mg/dL — ABNORMAL LOW (ref 8.9–10.3)
Chloride: 102 mmol/L (ref 98–111)
Chloride: 104 mmol/L (ref 98–111)
Creatinine, Ser: 0.47 mg/dL (ref 0.44–1.00)
Creatinine, Ser: 0.42 mg/dL — ABNORMAL LOW (ref 0.44–1.00)
GFR, Estimated: >60 mL/min (ref >60)
GFR, Estimated: >60 mL/min (ref >60)
Glucose, Bld: 92 mg/dL (ref 70–99)
Glucose, Bld: 77 mg/dL (ref 70–99)
Potassium: 3.8 mmol/L (ref 3.5–5.1)
Potassium: 3.8 mmol/L (ref 3.5–5.1)
Sodium: 137 mmol/L (ref 135–145)
Sodium: 137 mmol/L (ref 135–145)

## 2023-10-31 LAB — HEPARIN LEVEL (UNFRACTIONATED)
Heparin Unfractionated: <0.1 [IU]/mL — ABNORMAL LOW (ref 0.30–0.70)
Heparin Unfractionated: 0.15 [IU]/mL — ABNORMAL LOW (ref 0.30–0.70)
Heparin Unfractionated: 0.24 [IU]/mL — ABNORMAL LOW (ref 0.30–0.70)

## 2023-10-31 LAB — CBC
HCT: 39.6 % (ref 36.0–46.0)
Hemoglobin: 12.9 g/dL (ref 12.0–15.0)
MCH: 28.3 pg (ref 26.0–34.0)
MCHC: 32.6 g/dL (ref 30.0–36.0)
MCV: 86.8 fL (ref 80.0–100.0)
Platelets: 284 K/uL (ref 150–400)
RBC: 4.56 MIL/uL (ref 3.87–5.11)
RDW: 13.4 % (ref 11.5–15.5)
WBC: 8.2 K/uL (ref 4.0–10.5)
nRBC: 0 % (ref 0.0–0.2)

## 2023-10-31 LAB — APTT
aPTT: 42 s — ABNORMAL HIGH (ref 24–36)
aPTT: 60 s — ABNORMAL HIGH (ref 24–36)

## 2023-10-31 MED ORDER — HEPARIN BOLUS VIA INFUSION
1500.0000 [IU] | Freq: Once | INTRAVENOUS | Status: AC
  Administered 2023-10-31: 1500 [IU] via INTRAVENOUS
  Filled 2023-10-31: qty 1500

## 2023-10-31 MED ORDER — HEPARIN BOLUS VIA INFUSION
1800.0000 [IU] | Freq: Once | INTRAVENOUS | Status: AC
  Administered 2023-10-31: 1800 [IU] via INTRAVENOUS
  Filled 2023-10-31: qty 1800

## 2023-10-31 MED ORDER — ACETAMINOPHEN 500 MG PO TABS
1000.0000 mg | ORAL_TABLET | Freq: Four times a day (QID) | ORAL | Status: DC | PRN
  Administered 2023-10-31: 1000 mg via ORAL
  Filled 2023-10-31: qty 2

## 2023-10-31 NOTE — Progress Notes (Signed)
 Progress Note     Subjective: Patient reports continued abdominal pain. She reports that pain has not worsened. She did have a bowel movement yesterday morning. No bowel movement today yet. Reports flatulence. Reports some nausea. Denies vomiting.   ROS  All negative with the exception of above.   Objective: Vital signs in last 24 hours: Temp:  [97.8 F (36.6 C)-98.1 F (36.7 C)] 98.1 F (36.7 C) (08/25 0823) Pulse Rate:  [58-68] 68 (08/25 0823) Resp:  [16-18] 18 (08/25 0823) BP: (96-146)/(62-81) 96/62 (08/25 0823) SpO2:  [89 %-97 %] 91 % (08/25 0844) Last BM Date : 10/29/23  Intake/Output from previous day: 08/24 0701 - 08/25 0700 In: 1139.5 [I.V.:241.6; IV Piggyback:897.9] Out: 430 [Emesis/NG output:430] Intake/Output this shift: No intake/output data recorded.  PE: General: Pleasant female who is laying in bed in NAD. ENT: NGT tube noted at nare connected to wall cannister.  Lungs: Respiratory effort nonlabored Abd: Soft with mild distention. Generalized tenderness to palpation that is more prominent in the epigastric region. No guarding or rebound tenderness.  Psych: A&Ox3 with an appropriate affect.    Lab Results:  Recent Labs    10/30/23 0808 10/31/23 0021  WBC 7.8 8.2  HGB 13.5 12.9  HCT 41.1 39.6  PLT 271 284   BMET Recent Labs    10/30/23 0808 10/31/23 0021  NA 137 137  K 4.1 3.8  CL 100 102  CO2 24 27  GLUCOSE 100* 92  BUN 7 <5*  CREATININE 0.59 0.47  CALCIUM  8.8* 8.6*   PT/INR No results for input(s): LABPROT, INR in the last 72 hours. CMP     Component Value Date/Time   NA 137 10/31/2023 0021   NA 142 07/01/2021 1457   K 3.8 10/31/2023 0021   CL 102 10/31/2023 0021   CO2 27 10/31/2023 0021   GLUCOSE 92 10/31/2023 0021   BUN <5 (L) 10/31/2023 0021   BUN 8 07/01/2021 1457   CREATININE 0.47 10/31/2023 0021   CREATININE 0.64 10/21/2023 0000   CALCIUM  8.6 (L) 10/31/2023 0021   PROT 6.9 10/30/2023 0808   PROT 7.8 07/01/2021  1457   ALBUMIN 3.4 (L) 10/30/2023 0808   ALBUMIN 4.5 07/01/2021 1457   AST 17 10/30/2023 0808   AST 16 05/18/2023 1016   ALT 12 10/30/2023 0808   ALT 15 05/18/2023 1016   ALKPHOS 50 10/30/2023 0808   BILITOT 0.4 10/30/2023 0808   BILITOT 0.4 05/18/2023 1016   GFRNONAA >60 10/31/2023 0021   GFRNONAA >60 05/18/2023 1016   GFRAA >60 10/26/2019 0932   Lipase     Component Value Date/Time   LIPASE 41 10/30/2023 0808       Studies/Results: DG Abd 1 View Result Date: 10/31/2023 CLINICAL DATA:  Check NGT insertion. EXAM: ABDOMEN - 1 VIEW COMPARISON:  Portable abdomen yesterday 9:16 p.m. FINDINGS: 7:14 a.m. NGT is at the upper margin of the film. 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 There is still dilatation of small bowel segments up to 3 cm, previously 3.6 cm. Enteric contrast has cleared out of the small bowel on the current exam with scattered contrast and gas within the nondilated colon through to the rectum. There is no supine evidence of free air. No other significant radiographic findings. The true pelvis was mostly excluded from the exam. IMPRESSION: 1. NGT is coiled within the gastric remnant. 2. Persistent dilatation of small bowel segments up to 3 cm, previously  3.6 cm. 3. Enteric contrast has cleared out of the small bowel since the prior study. Electronically Signed   By: Francis Quam M.D.   On: 10/31/2023 07:40   DG Abd Portable 1V-Small Bowel Obstruction Protocol-initial, 8 hr delay Result Date: 10/30/2023 CLINICAL DATA:  8 hour small-bowel follow-up film EXAM: PORTABLE ABDOMEN - 1 VIEW COMPARISON:  Film from earlier in the same day. FINDINGS: Gastric catheter remains in the stomach. Previously administered contrast lies partially within the mildly dilated small bowel although has reached the proximal ascending colon. The distal ileal loops appear within normal limits. IMPRESSION: Changes consistent with partial small  bowel obstruction. Some persistent small bowel dilatation is noted proximally similar to that seen on prior CT. Electronically Signed   By: Oneil Devonshire M.D.   On: 10/30/2023 21:26   DG Abd Portable 1 View Result Date: 10/30/2023 EXAM: 1 VIEW XRAY OF THE ABDOMEN 10/30/2023 12:14:00 PM COMPARISON: Early film of the same day. CLINICAL HISTORY: Check NG tube placement. FINDINGS: BOWEL: A few distended small bowel loops in the left mid abdomen. SOFT TISSUES: No opaque urinary calculi. BONES: No acute osseous abnormality. LINES AND TUBES: Gastric tube seen to the fundus of the decompressed stomach. IMPRESSION: 1. Gastric tube in appropriate position. 2. A few distended small bowel loops in the left mid abdomen. No bowel obstruction. Electronically signed by: Katheleen Faes MD 10/30/2023 12:51 PM EDT RP Workstation: [id number]   DG Abd Portable 1V-Small Bowel Protocol-Position Verification Result Date: 10/30/2023 EXAM: 1 VIEW XRAY OF THE ABDOMEN 10/30/2023 12:12:00 PM COMPARISON: CT from earlier the same day. CLINICAL HISTORY: Encounter for imaging study to confirm nasogastric (NG) tube placement. FINDINGS: BOWEL: A few distended mid abdominal small bowel loops. Nonobstructive bowel gas pattern. SOFT TISSUES: No opaque urinary calculi. BONES: No acute osseous abnormality. LINES AND TUBES: Nasogastric tube with tip near the gastroesophageal junction. IMPRESSION: 1. Decompressed stomach with tip of the nasogastric tube near the GE junction. 2. A few distended mid abdominal small bowel loops. No bowel obstruction. Electronically signed by: Katheleen Faes MD 10/30/2023 12:50 PM EDT RP Workstation: [id number]   CT ABDOMEN PELVIS W CONTRAST Result Date: 10/30/2023 EXAM: CT ABDOMEN AND PELVIS WITH CONTRAST 10/30/2023 09:57:33 AM TECHNIQUE: CT of the abdomen and pelvis was performed with the administration of intravenous contrast. Multiplanar reformatted images are provided for review. Automated exposure control, iterative  reconstruction, and/or weight-based adjustment of the mA/kV was utilized to reduce the radiation dose to as low as reasonably achievable. COMPARISON: None available. CLINICAL HISTORY: Bowel obstruction suspected. FINDINGS: LOWER CHEST: Mild dependent atelectasis in the lung bases. LIVER: Normal size and contour. GALLBLADDER AND BILE DUCTS: No wall thickening. No cholelithiasis. No biliary ductal dilatation. SPLEEN: Normal size. No focal lesion. PANCREAS: No mass. No ductal dilatation. ADRENAL GLANDS: Normal appearance. No mass. KIDNEYS, URETERS AND BLADDER: No stones in the kidneys or ureters. No hydronephrosis. No perinephric or periureteral stranding. Urinary bladder is unremarkable. GI AND BOWEL: Moderate hiatal hernia, post gastric surgery. Proximal small bowel decompressed. A few fluid distended mid abdominal small bowel loops with fecalization of a loop in the right central pelvis, beyond which there is abrupt transition to decompressed bowel. PERITONEUM AND RETROPERITONEUM: There is a trace adjacent amount of free fluid in the right pelvis without evident loculation or peripheral enhancement. No free air. VASCULATURE: Aorta is normal in caliber. LYMPH NODES: No lymphadenopathy. REPRODUCTIVE ORGANS: No significant abnormality. BONES AND SOFT TISSUES: Bilateral hip arthroplasty. Mild lumbar dextroscoliosis. No acute osseous abnormality. No focal  soft tissue abnormality. IMPRESSION: 1. Early partial mid Small bowel obstruction with transition point in the right central pelvis, no mass or evident etiology suggesting adhesion. 2. Trace free fluid in the right pelvis without evident loculation or peripheral enhancement. 3. Moderate hiatal hernia, post gastric surgery. Electronically signed by: Katheleen Faes MD 10/30/2023 10:37 AM EDT RP Workstation: [id number]   DG Chest Portable 1 View Result Date: 10/30/2023 CLINICAL DATA:  Fall. EXAM: PORTABLE CHEST 1 VIEW COMPARISON:  None Available. FINDINGS: Low volume  lordotic film. The lungs are clear without focal pneumonia, edema, pneumothorax or pleural effusion. Cardiopericardial silhouette is at upper limits of normal for size. No acute bony abnormality. Oblique linear lucency through the proximal humerus is probably artifact IMPRESSION: 1. Low volume lordotic film without acute cardiopulmonary findings. 2. Oblique linear lucency through the proximal humerus is probably artifact. Correlation for point tenderness recommended. If there is concern for proximal humerus fracture, dedicated left shoulder films recommended. Electronically Signed   By: Camellia Candle M.D.   On: 10/30/2023 08:21    Anti-infectives: Anti-infectives (From admission, onward)    None        Assessment/Plan SBO vs pSBO - CT 8/24 showed early partial mid SBO with transition point in the right central pelvis, no mass or evident etiology suggesting adhesion. Trace free fluid in the right pelvis without evident loculation or peripheral enhancement. Moderate hiatal hernia, post gastric surgery. - DG abd port SBO protocol 8/25 showed gastric catheter remains in the stomach. Previously administered contrast lies partially within the mildly dilated small bowel although has reached the proximal ascending colon -DG Abd from 8/25 showed NGT coiled within in gastric remnant. Persistent dilatation of small bowel segments up to 3 cm, previously 3.6 cm. Enteric contrast has cleared out of the small bowel since the prior study. -CBC WNL -Vitals stable -NGT output noted to be 300 mL -Continue NPO; Will attempt NGT clamp trial. Pending clamp trial will attempt to advance diet.   FEN: NPO; NGT clamp trial; IVF per hospital team VTE: Heparin  ID: None currently    LOS: 1 day   I reviewed specialist notes, hospitalist notes, last 24 h vitals and pain scores, last 48 h intake and output, last 24 h labs and trends, and last 24 h imaging results.  This care required moderate level of medical  decision making.    Marjorie Carlyon Favre, [facility] 10/31/2023, 11:10 AM Please see Amion for pager number during day hours 7:00am-4:30pm

## 2023-10-31 NOTE — Assessment & Plan Note (Signed)
 Asthma: Continue home inhalers GERD: On 40 mg twice daily Protonix  at home, start IV until able to p.o. Vitamin D  deficiency: Restart vitamin D  supplementation once able to p.o. Migraine: Restart p.o. migraine medication once able to p.o. SLE: Follows with [facility], does not appear to be taking active DMARDs. Prior history of Plaquenil /prednisone  use. Hyperlipidemia: Restart Zetia  and atorvastatin  once able to p.o. History of hematuria/renal cyst: Looks like patient was seen on 07/23/2020 by [facility]-Kate Debarah, was treated for trichomonas and supposed to follow-up but does not appear that she has done so.  She was supposed to have repeat labs, if migraine was present they would do hematuria workup.  Do not believe this is related to her current presentation, but will put on follow-up recommendations. Antiphospholipid syndrome: Heparin  while n.p.o. Resume home Eliquis  5 mg twice a day once able to PO

## 2023-10-31 NOTE — Progress Notes (Addendum)
 PHARMACY - ANTICOAGULATION CONSULT NOTE  Pharmacy Consult for heparin  Indication: Antiphospholipid antibody  Allergies  Allergen Reactions   Fish Allergy  Anaphylaxis, Shortness Of Breath and Swelling    Throat closes Specifically salmon and tilapia     Patient Measurements: Height: 4' 9 (144.8 cm) Weight: 85.3 kg (188 lb) IBW/kg (Calculated) : 38.6 HEPARIN  DW (KG): 59.4  Vital Signs: Temp: 98.1 F (36.7 C) (08/25 0823) Temp Source: Oral (08/25 0823) BP: 96/62 (08/25 0823) Pulse Rate: 68 (08/25 0823)  Labs: Recent Labs    10/30/23 0808 10/30/23 1036 10/31/23 0021 10/31/23 0923  HGB 13.5  --  12.9  --   HCT 41.1  --  39.6  --   PLT 271  --  284  --   APTT  --   --  42* 60*  HEPARINUNFRC  --   --  <0.10* 0.15*  CREATININE 0.59  --  0.47  --   TROPONINIHS <2 <2  --   --     Estimated Creatinine Clearance: 75.3 mL/min (by C-G formula based on SCr of 0.47 mg/dL).   Medical History: Past Medical History:  Diagnosis Date   Anemia    Anxiety    Arthritis    Asthma    Cancer (HCC)    skin cancer   Chronic pain    Complication of anesthesia    Asthma attack during colonoscopy and EGD   Congenital hip dislocation    Depression    GERD (gastroesophageal reflux disease)    Iron deficiency anemia 04/14/2018   Kidney mass 2017   Migraine    Osteoporosis    Pathological dislocation of shoulder joint, bilateral    congential   Pneumonia    as a child   PTSD (post-traumatic stress disorder)    Sleep apnea    Hx. of no CPAP   Systemic lupus erythematosus (HCC)    Tuberculosis    as a child was treated   Urticaria     Assessment: 45 YOF presenting with abdominal pain, concern for SBO, on Eliquis  PTA for antiphospholipid syndrome. Transition to heparin  while NPO for SBO eval.   Heparin  Level 0.15 and APTT 60 at 1100 units/hr, subtherapeutic. Hgb WNL, PLT WNL. RN reports Heparin  infusing without any issues or interruptions,  IV site looks good and no bleeding  noted.  Given first Heparin  Level was < 0.1 it appears the pt is noncompliant with Eliquis . Will not reorder APTT.  Goal of Therapy:  Heparin  level 0.3-0.7 units/ml aPTT 66-102 seconds Monitor platelets by anticoagulation protocol: Yes   Plan:  Bolus 1800 units x1 Increase heparin  infusion at 1300 units/hr  Check heparin  level in 6 hours and daily while on heparin   Continue to monitor H&H and platelets   Thank you for allowing pharmacy to be a part of this patient's care    Prentice DOROTHA Favors, PharmD PGY1 Health-System Pharmacy Administration and Leadership Resident [facility]  10/31/2023 11:24 AM

## 2023-10-31 NOTE — Plan of Care (Signed)
   Problem: Education: Goal: Knowledge of General Education information will improve Description Including pain rating scale, medication(s)/side effects and non-pharmacologic comfort measures Outcome: Progressing

## 2023-10-31 NOTE — Plan of Care (Signed)

## 2023-10-31 NOTE — Progress Notes (Signed)
 PHARMACY - ANTICOAGULATION CONSULT NOTE  Pharmacy Consult for heparin  Indication: Antiphospholipid antibody  Allergies  Allergen Reactions   Fish Allergy  Anaphylaxis, Shortness Of Breath and Swelling    Throat closes Specifically salmon and tilapia     Patient Measurements: Height: 4' 9 (144.8 cm) Weight: 85.3 kg (188 lb) IBW/kg (Calculated) : 38.6 HEPARIN  DW (KG): 59.4  Vital Signs: Temp: 98 F (36.7 C) (08/25 1938) Temp Source: Oral (08/25 1938) BP: 102/62 (08/25 1938) Pulse Rate: 70 (08/25 1938)  Labs: Recent Labs    10/30/23 0808 10/30/23 1036 10/31/23 0021 10/31/23 0923 10/31/23 1224 10/31/23 2122  HGB 13.5  --  12.9  --   --   --   HCT 41.1  --  39.6  --   --   --   PLT 271  --  284  --   --   --   APTT  --   --  42* 60*  --   --   HEPARINUNFRC  --   --  <0.10* 0.15*  --  0.24*  CREATININE 0.59  --  0.47  --  0.42*  --   TROPONINIHS <2 <2  --   --   --   --     Estimated Creatinine Clearance: 75.3 mL/min (A) (by C-G formula based on SCr of 0.42 mg/dL (L)).   Assessment: 89 YOF presenting with abdominal pain, concern for SBO, on Eliquis  PTA for antiphospholipid syndrome. Transition to heparin  while NPO for SBO eval.   Heparin  level subtherapeutic (0.24) on infusion at 1300 units/hr. No issues with line or bleeding reported per RN.  Goal of Therapy:  Heparin  level 0.3-0.7 units/ml Monitor platelets by anticoagulation protocol: Yes   Plan:  Increase heparin  infusion to 1450 units/hr  Will f/u 6 hr heparin  level  Thank you for allowing pharmacy to be a part of this patient's care    Vito Ralph, PharmD, BCPS Please see amion for complete clinical pharmacist phone list 10/31/2023 9:50 PM

## 2023-10-31 NOTE — Assessment & Plan Note (Addendum)
 Passing flatus. Abdominal exam nonconcerning.  - general surgery following, appreciate recs - NG tube to suction - NPO - continue IVF while NPO - Protonix  40 mg IV BID - Pain: dilaudid  0.5 mg q2h prn - Zofran  4 mg IV q6h PRN

## 2023-10-31 NOTE — Progress Notes (Addendum)
 Daily Progress Note Intern Pager: [phone]  Patient name: Melissa Gaines Medical record number: [id number] Date of birth: 1970-05-05 Age: 53 y.o. Gender: female  Primary Care Provider: Alba Sharper, MD Consultants: general surgery Code Status: full  Pt Overview and Major Events to Date:  8/24 - admitted  Assessment and Plan:  This is a 53 yo female female with abdominal pain found to have a partial SBO, with NG tube to suction, NPO, passing flatus and overall clinically stable. Pertinent PMH/PSH includes asthma, antiphospholipid syndrome/history of PE, GERD.  Assessment & Plan Small bowel obstruction (HCC) Passing flatus. Abdominal exam nonconcerning.  - general surgery following, appreciate recs - NG tube to suction - NPO - continue IVF while NPO - Protonix  40 mg IV BID - Pain: dilaudid  0.5 mg q2h prn - Zofran  4 mg IV q6h PRN Chronic health problem Asthma: Continue home inhalers GERD: On 40 mg twice daily Protonix  at home, start IV until able to p.o. Vitamin D  deficiency: Restart vitamin D  supplementation once able to p.o. Migraine: Restart p.o. migraine medication once able to p.o. SLE: Follows with [facility], does not appear to be taking active DMARDs. Prior history of Plaquenil /prednisone  use. Hyperlipidemia: Restart Zetia  and atorvastatin  once able to p.o. History of hematuria/renal cyst: Looks like patient was seen on 07/23/2020 by [facility]-Kate Debarah, was treated for trichomonas and supposed to follow-up but does not appear that she has done so.  She was supposed to have repeat labs, if migraine was present they would do hematuria workup.  Do not believe this is related to her current presentation, but will put on follow-up recommendations. Antiphospholipid syndrome: Heparin  while n.p.o. Resume home Eliquis  5 mg twice a day once able to PO  FEN/GI: NPO PPx: heparin  infusion Dispo:Home pending clinical  improvement .   Subjective:  Reports her abdominal pain is the same as yesterday, localized to epigastrium and LUQ. Also feels a globus sensation in her superior sternum. No heart burn. No chest pain or SOB. Was vomiting a lot but no vomiting now. Passing flatus. No left shoulder pain. 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   She was primarily concerned about her PT appt tomorrow, Labs on Wednesday, and other appt Friday. I advised her to call those clinics to cancel the tomorrow and Wednesday appt. She will reevaluate rescheduling Friday appt later this week pending clinical improvement.   Objective: Temp:  [97.8 F (36.6 C)-98.1 F (36.7 C)] 97.9 F (36.6 C) (08/24 2100) Pulse Rate:  [56-70] 66 (08/24 2100) Resp:  [13-25] 16 (08/24 2100) BP: (104-146)/(63-91) 132/81 (08/24 2100) SpO2:  [89 %-100 %] 92 % (08/24 2100) Weight:  [85.3 kg] 85.3 kg (08/24 0754)   Intake/Output Summary (Last 24 hours) at 10/31/2023 0756 Last data filed at 10/31/2023 0700 Gross per 24 hour  Intake 1139.5 ml  Output 430 ml  Net 709.5 ml   Physical Exam: General: lying in bed, NAD Chest: no subcutaneous crepitus, tender to palpation of costochondral joints Cardiovascular: RRR, no m/r/g Respiratory: CTAB Abdomen: soft, mod tenderness in the epigastrium and LUQ, no palpable masses, no guarding, no rebound. No distension Extremities: no edema, nonpainful ROM of the LUE. No pain with palpation of the left shoulder/humerus.  Laboratory: Most recent CBC Lab Results  Component Value Date   WBC 8.2 10/31/2023   HGB 12.9 10/31/2023   HCT 39.6 10/31/2023   MCV 86.8 10/31/2023   PLT 284 10/31/2023   Most recent  BMP    Latest Ref Rng & Units 10/31/2023   12:21 AM  BMP  Glucose 70 - 99 mg/dL 92   BUN 6 - 20 mg/dL <5   Creatinine 9.55 - 1.00 mg/dL 9.52   Sodium 864 - 854 mmol/L 137   Potassium 3.5 - 5.1 mmol/L 3.8   Chloride 98 - 111 mmol/L 102   CO2 22 - 32 mmol/L 27   Calcium   8.9 - 10.3 mg/dL 8.6    Lactic acid 1.4  Imaging/Diagnostic Tests: Abd xray 10/31/23 Rads impression: 1. NGT is coiled within the gastric remnant. 2. Persistent dilatation of small bowel segments up to 3 cm, previously 3.6 cm. 3. Enteric contrast has cleared out of the small bowel since the prior study.  Alena Morrison, [place], MD 10/31/2023, 7:51 AM  PGY-1, [facility] FPTS Intern pager: [phone], text pages welcome Secure chat group [facility] Teaching Service

## 2023-10-31 NOTE — Progress Notes (Signed)
 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 Throat closes and short of breath.    Patient Measurements: Height: 4' 9 (144.8 cm) Weight: 85.3 kg (188 lb) IBW/kg (Calculated) : 38.6 HEPARIN  DW (KG): 59.4  Vital Signs: Temp: 97.9 F (36.6 C) (08/24 2100) Temp Source: Oral (08/24 2100) BP: 132/81 (08/24 2100) Pulse Rate: 66 (08/24 2100)  Labs: Recent Labs    10/30/23 0808 10/30/23 1036 10/31/23 0021  HGB 13.5  --  12.9  HCT 41.1  --  39.6  PLT 271  --  284  APTT  --   --  42*  HEPARINUNFRC  --   --  <0.10*  CREATININE 0.59  --  0.47  TROPONINIHS <2 <2  --     Estimated Creatinine Clearance: 75.3 mL/min (by C-G formula based on SCr of 0.47 mg/dL).   Medical History: Past Medical History:  Diagnosis Date   Anemia    Anxiety    Arthritis    Asthma    Cancer (HCC)    skin cancer   Chronic pain    Complication of anesthesia    Asthma attack during colonoscopy and EGD   Congenital hip dislocation    Depression    GERD (gastroesophageal reflux disease)    Iron deficiency anemia 04/14/2018   Kidney mass 2017   Migraine    Osteoporosis    Pathological dislocation of shoulder joint, bilateral    congential   Pneumonia    as a child   PTSD (post-traumatic stress disorder)    Sleep apnea    Hx. of no CPAP   Systemic lupus erythematosus (HCC)    Tuberculosis    as a child was treated   Urticaria     Assessment: 24 YOF presenting with abdominal pain, concern for SBO, on Eliquis  PTA for antiphospholipid syndrome. Transition to heparin  while NPO for SBO eval.   8/25 2:00 AM:  initial 6 hour  PTT =42,  HL <0.1 on heparin  900 units/hr.  Subtherapeutic PTT and HL. CBC wnl stable Patient was supposedly taking Eliquis  PTA , but HL <0.1, thus appears may be noncompliant with Eliquis .  RN reports Heparin  infusing  without any issues or interruptions,  IV site looks good and no bleeding noted.  Goal of Therapy:  Heparin  level 0.3-0.7 units/ml aPTT 66-102 seconds Monitor platelets by anticoagulation protocol: Yes   Plan:  Give heparin  bolus 1500 units x1  Increase Heparin  gtt to 1100 units/hr F/u 6 hour aPTT/HL.  Probably ok to use HL's only as looks like noncompliant with Eliquis . Daily HL and CBC F/u SBO workup and ability to transition back to enteral   Levorn Gaskins, RPh Clinical Pharmacist 10/31/2023 1:45 AM

## 2023-11-01 ENCOUNTER — Other Ambulatory Visit: Payer: Self-pay

## 2023-11-01 ENCOUNTER — Encounter (HOSPITAL_COMMUNITY): Payer: Self-pay | Admitting: Internal Medicine

## 2023-11-01 ENCOUNTER — Ambulatory Visit: Admitting: Physical Therapy

## 2023-11-01 DIAGNOSIS — K56609 Unspecified intestinal obstruction, unspecified as to partial versus complete obstruction: Secondary | ICD-10-CM | POA: Diagnosis not present

## 2023-11-01 DIAGNOSIS — E785 Hyperlipidemia, unspecified: Secondary | ICD-10-CM

## 2023-11-01 LAB — BASIC METABOLIC PANEL WITH GFR
Anion gap: 8 (ref 5–15)
BUN: <5 mg/dL — ABNORMAL LOW (ref 6–20)
CO2: 27 mmol/L (ref 22–32)
Calcium: 8.5 mg/dL — ABNORMAL LOW (ref 8.9–10.3)
Chloride: 103 mmol/L (ref 98–111)
Creatinine, Ser: 0.57 mg/dL (ref 0.44–1.00)
GFR, Estimated: >60 mL/min (ref >60)
Glucose, Bld: 89 mg/dL (ref 70–99)
Potassium: 4 mmol/L (ref 3.5–5.1)
Sodium: 138 mmol/L (ref 135–145)

## 2023-11-01 LAB — CBC
HCT: 38.3 % (ref 36.0–46.0)
Hemoglobin: 12.6 g/dL (ref 12.0–15.0)
MCH: 28.6 pg (ref 26.0–34.0)
MCHC: 32.9 g/dL (ref 30.0–36.0)
MCV: 87 fL (ref 80.0–100.0)
Platelets: 217 K/uL (ref 150–400)
RBC: 4.4 MIL/uL (ref 3.87–5.11)
RDW: 13.5 % (ref 11.5–15.5)
WBC: 5.5 K/uL (ref 4.0–10.5)
nRBC: 0 % (ref 0.0–0.2)

## 2023-11-01 LAB — HEPARIN LEVEL (UNFRACTIONATED): Heparin Unfractionated: 0.42 [IU]/mL (ref 0.30–0.70)

## 2023-11-01 MED ORDER — PANTOPRAZOLE SODIUM 40 MG PO TBEC
40.0000 mg | DELAYED_RELEASE_TABLET | Freq: Two times a day (BID) | ORAL | Status: DC
  Administered 2023-11-01 – 2023-11-02 (×2): 40 mg via ORAL
  Filled 2023-11-01 (×2): qty 1

## 2023-11-01 MED ORDER — EZETIMIBE 10 MG PO TABS
10.0000 mg | ORAL_TABLET | Freq: Every day | ORAL | Status: DC
Start: 2023-11-01 — End: 2023-11-03
  Administered 2023-11-01 – 2023-11-02 (×2): 10 mg via ORAL
  Filled 2023-11-01 (×2): qty 1

## 2023-11-01 MED ORDER — ONDANSETRON 4 MG PO TBDP: 4.0000 mg | ORAL_TABLET | Freq: Three times a day (TID) | ORAL | Status: DC | PRN

## 2023-11-01 MED ORDER — PANTOPRAZOLE SODIUM 40 MG PO TBEC: 40.0000 mg | DELAYED_RELEASE_TABLET | Freq: Every day | ORAL | Status: DC | PRN

## 2023-11-01 MED ORDER — APIXABAN 5 MG PO TABS
5.0000 mg | ORAL_TABLET | Freq: Two times a day (BID) | ORAL | Status: DC
  Administered 2023-11-01 – 2023-11-02 (×3): 5 mg via ORAL
  Filled 2023-11-01 (×3): qty 1

## 2023-11-01 MED ORDER — ATORVASTATIN CALCIUM 80 MG PO TABS: 80.0000 mg | ORAL_TABLET | Freq: Every day | ORAL | 3 refills | Status: AC

## 2023-11-01 MED ORDER — TRAZODONE HCL 50 MG PO TABS
100.0000 mg | ORAL_TABLET | Freq: Every day | ORAL | Status: DC
  Administered 2023-11-01: 100 mg via ORAL
  Filled 2023-11-01: qty 2

## 2023-11-01 MED ORDER — HYDROMORPHONE HCL 2 MG PO TABS
1.0000 mg | ORAL_TABLET | Freq: Four times a day (QID) | ORAL | Status: DC | PRN
  Administered 2023-11-01: 1 mg via ORAL
  Filled 2023-11-01: qty 1

## 2023-11-01 NOTE — Plan of Care (Incomplete)
 Diet advanced to full liquid, patient stated that potato soup caused her stomach to hurt. Patient denies nausea, no vomiting noted. Pt preferred clear's jello juice and popsicles. MD updated. Pt up to bathroom, stand by assist, without a device. Passing gas, no BM. Plan of care continues.    Problem: Education: Goal: Knowledge of General Education information will improve Description: Including pain rating scale, medication(s)/side effects and non-pharmacologic comfort measures Outcome: Progressing   Problem: Health Behavior/Discharge Planning: Goal: Ability to manage health-related needs will improve Outcome: Progressing   Problem: Clinical Measurements: Goal: Ability to maintain clinical measurements within normal limits will improve Outcome: Progressing Goal: Will remain free from infection Outcome: Progressing Goal: Diagnostic test results will improve Outcome: Progressing Goal: Respiratory complications will improve Outcome: Progressing Goal: Cardiovascular complication will be avoided Outcome: Progressing   Problem: Activity: Goal: Risk for activity intolerance will decrease Outcome: Progressing   Problem: Nutrition: Goal: Adequate nutrition will be maintained Outcome: Progressing   Problem: Coping: Goal: Level of anxiety will decrease Outcome: Progressing   Problem: Elimination: Goal: Will not experience complications related to bowel motility Outcome: Progressing Goal: Will not experience complications related to urinary retention Outcome: Progressing   Problem: Pain Managment: Goal: General experience of comfort will improve and/or be controlled Outcome: Progressing   Problem: Safety: Goal: Ability to remain free from injury will improve Outcome: Progressing   Problem: Skin Integrity: Goal: Risk for impaired skin integrity will decrease Outcome: Progressing

## 2023-11-01 NOTE — Plan of Care (Signed)

## 2023-11-01 NOTE — Assessment & Plan Note (Addendum)
 Asthma: Continue home inhalers GERD: Patient reportedly not taking 40 mg twice daily Protonix  at home, is taking OP famotidine  20 mg daily Vitamin D  deficiency: Restart vitamin D  supplementation once able to p.o. Migraine: Patient reportedly not taking p.o. migraine medication  SLE: Follows with [facility], does not appear to be taking active DMARDs. Prior history of Plaquenil /prednisone  use. Hyperlipidemia: Patient taking Zetia  but not atorvastatin  at home History of hematuria/renal cyst: Looks like patient was seen on 07/23/2020 by [facility]-Kate Debarah, was treated for trichomonas and supposed to follow-up but does not appear that she has done so.  She was supposed to have repeat labs, if migraine was present they would do hematuria workup.  Do not believe this is related to her current presentation, but will put on follow-up recommendations. Antiphospholipid syndrome: Resume home Eliquis  5 mg twice a day, discontinue heparin 

## 2023-11-01 NOTE — Discharge Instructions (Signed)
 [employer] assistance programs Crisis assistance programs  -[facility] Arts development officer. If you are experiencing homelessness in [place], [place] , your first point of contact should be Pensions consultant. You can reach Coordinated Entry by calling (336) [phone] or by emailing [email or web address] [email or web address].  Community access points: [employer] [phone] [address], [place]) every Tuesday from 9am-10am. [facility] ([address] [place]. [address], [place]) every Wednesday from 8am-9am.   -[place] Coordinated Re-entry Daniel Mcalpine: Dial 211 and request. Offers referrals to homeless shelters in the area.    -The [employer] (902) 817-9092) offers several services to local families, as funding allows. The Emergency Assistance Program (EAP), which they administer, provides household goods, free food, clothing, and financial aid to people in need in the [place]  area. The EAP program does have some qualification, and counselors will interview clients for financial assistance by written referral only. Referrals need to be made by the Department of Social Services or by other EAP approved human services agencies or charities in the area.  -Open Door Ministries of [employer], which can be reached at (825)070-4784, offers emergency assistance programs for those in need of help, such as food, rent assistance, a soup kitchen, shelter, and clothing. They are based in [facility]  but provide a number of services to those that qualify for assistance.   [facility] Department of Social Services may be able to offer temporary financial assistance and cash grants for paying rent and utilities, Help may be provided for local county residents who may be experiencing personal crisis when other resources, including government programs, are not available. Call [phone]  -High [employer] is a [employer] agency, The organization can offer emergency assistance for paying rent, Caremark Rx, utilities, food, household products and furniture. They offer extensive emergency and transitional housing for families, children and single women, and also run a Boy's and [employer]. Thrift Shops, Secondary school teacher, and other aid offered too. [address], [place], [place]  [address], [phone]  -[place] Low Income Energy Assistance Program -- This is offered for [facility] families. The federal government created [employer] Program provides a one-time cash grant payment to help eligible low-income families pay their electric and heating bills. [address] [place], [place]  [address], [phone]  -[place] Emergency Assistance -- A program offers emergency utility and rent funds for greater [employer] area residents. The program can also provide counseling and referrals to charities and government programs. Also provides food and a free meal program that serves lunch Mondays - Saturdays and dinner seven days per week to individuals in the community. [address] [place], [place]  [address], [phone]  -[employer] - Offers affordable apartment and housing communities across      [place] and [place]. The low income and seniors can access public housing, rental assistance to qualified applicants, and apply for the section 8 rent subsidy program. Other programs include Chiropractor and Engineer, maintenance. [address], [place], [place]  [address], dial [phone].  -The [facility] provides transitional housing to veterans and the disabled. Clients will also access other services too, including assistance in applying for Disability, life skills classes, case management, and assistance in finding permanent housing. [address] [place], [place]  [place] [address], call [phone]  -[place] Transitional Housing through [facility] is for people who were just  evicted or that are formerly homeless. The non-profit will also help then gain self-sufficiency, find a home or apartment to live in, and also provides information on rent assistance when needed. Phone [phone]  -The [place] [facility] [facility] helps low income, elderly, or disabled residents in seven counties in the [place] ([place], [place], [place], [place], [place], [place], [place], and [place]) save energy and reduce their utility bills by improving energy efficiency. Phone ([phone].  -[employer] is located in the [place] [facility] in the [employer], [address], [place], [place] [address]. Parking is in the rear of the building. Phone: [phone]   General Email: info@gsohc .org  GHC provides free housing counseling assistance in locating affordable rental housing or housing with support services for families and individuals in crisis and the chronically homeless. We provide potential resources for other housing needs like utilities. Our trained counselors also work with clients on budgeting and financial literacy in effort to empower them to take control of their financial situations. [employer] collaborates with homeless service providers and other stakeholders as part of the [employer] COC (Continuum of Care). The (COC) is a regional/local planning body that coordinates housing and services funding for homeless families and individuals. The role of [facility] in the COC is through housing counseling to work with people we serve on diversion strategies for those that are at imminent risk of becoming homeless. We also work with the Coordinated Assessment/Entry Specialist who attempts to find temporary solutions and/or connects the people  to Housing First, Rapid Re-housing or transitional housing programs. Our Homelessness Prevention Housing Counselors meet with clients on business days (Monday-Fridays, except scheduled holidays) from 8:30 am to 4:30 pm.  Legal assistance for evictions, foreclosure, and more -If you need free legal advice on civil issues, such as foreclosures, evictions, Electronics engineer, government programs, domestic issues and more, Landscape architect of [facility]) is a Associate Professor firm that provides free legal services and counsel to lower income people, seniors, disabled, and others, The goal is to ensure everyone has access to justice and fair representation. Call them at [phone].  [facility] for Housing and Community Studies can provide info about obtaining legal assistance with evictions. Phone [phone].  Data processing manager  The [employer], [employer]. offers job and Dispensing optician. Resources are focused on helping students obtain the skills and experiences that are necessary to compete in today's challenging and tight job market. The non-profit faith-based community action agency offers internship trainings as well as classroom instruction. Classes are tailored to meet the needs of people in the [facility] region. [place], [place] [address], [phone]  Foreclosure prevention/Debt Services Family Services of the [employer] Credit Counseling Service inludes debt and foreclosure prevention programs for local families. This includes money management, financial advice, budget review and development of a written action plan with a Pensions consultant to help solve specific individual financial problems. In addition, housing and mortgage counselors can also provide pre- and post-purchase homeownership counseling, default resolution counseling (to prevent foreclosure) and reverse mortgage counseling. A Debt Management Program allows  people and families with a high level of credit card or medical debt to consolidate and repay consumer debt and loans to creditors and rebuild positive credit ratings and scores. Contact (336) D7650557.  Community clinics in [place] -Health Department [facility] Clinic: [address] [place], [place], [address]. [phone].  -Health Department [place] Clinic: [phone]  [address], [facility], [address]. [phone].  -[facility] offers medical care through a group of doctors, pharmacies and other healthcare related agencies that offer services for low income, uninsured adults in [place]. Also offers adult Dental care and assistance with applying for an [employer]. Call [phone].   [facility]. This center provides low-cost health care to those without health insurance. Services offered include an onsite pharmacy. Phone ([phone]. [address], [place].  -Medication Assistance Program serves as a link between pharmaceutical companies and patients to provide low cost or free prescription medications. This service is available for residents who meet certain income restrictions and have no insurance coverage. PLEASE CALL [phone] [place]) OR ([phone] ([place])  -One Step Further: Materials engineer, The [employer] Support & Nutrition Program, [employer]. Call (619)[phone]/ [phone].  Food pantry and assistance -Urban Ministry-Food Bank: [address], [place] [address]. Phone [phone]  -Blessed Table Food Pantry: [address] [place], [place] [address]. ([phone].  -Missionary Ministry: has the purpose of visiting the sick and shut-ins and provide for needs in the surrounding communities. Call [phone]. Email: [email or web address] .com This program provides: Food box for seniors, Financial assistance, Food to meet basic  nutritional needs.  -Meals on Wheels with Senior Resources: [facility] residents age 14 and over who are homebound and unable to obtain and prepare a nutritious meal for themselves are eligible for this service. There may be a waiting list in certain parts of [facility] if the route in that area is full. If you are in [facility] and [place] call ([phone] to register. For all other areas call [phone] to register.  -Greater Dietitian: https://findfood.BargainContractor.si  TRANSPORTATION: -[employer] Department of Health: Call [facility] and [employer] at [phone] for details. AttractionGuides.es  -Access [place]: Access [place] is the [employer] Agency's shared-ride transportation service for eligible riders who have a disability that prevents them from riding the fixed route bus. Call ([phone]. Access [place] riders must pay a fare of $1.50 per trip, or may purchase a 10-ride punch card for $14.00 ($1.40 per ride) or a 40-ride punch card for $48.00 ($1.20 per ride).  -The Shepherd's WHEELS rideshare transportation service is provided for senior citizens (60+) who live independently within [place] city limits and are unable to drive or have limited access to transportation. Call [phone] to schedule an appointment.  -[place] Transportation: For Medicare or Medicaid recipients call [phone]?S[facility]RA Ambulance, wheelchair fleeta, and ambulatory quotes available.   FLEEING VIOLENCE: -Family Services of the [place]- 24/7 Crisis line 254-800-9272) -[facility] Justice Centers: (336) 641-SAFE (325)[phone])  [place] 2-1-1 is another useful way to locate resources in the community. Visit ShedSizes.ch to find service information online. If you need additional assistance, 2-1-1 Referral Specialists are available 24 hours a day, every day by dialing  2-1-1 or [phone] from any phone. The call is free, confidential, and available in any language.  Affordable Housing Search http://www.nchousingsearch.[facility])   M-F 8a-3p [address]  [place], [place] [address] [phone] Services include: laundry, barbering, support groups, case management, phone & computer access, showers, AA/NA mtgs, mental health/substance abuse nurse, job skills class, disability information, VA assistance, spiritual classes, etc. Winter Shelter available when temperatures are less than 32 degrees.   HOMELESS SHELTERS [facility] Night [facility] at [facility]- Call [phone] ext. [phone]  or ext. 336. Located at [address], [place], [place] [address]  [facility] Mens Shelter- Call [phone]. Located at [address] [place] [address].  [facility]- [employer]. Call ([phone]. Office located at [address], [place] [address].  Pathways Family Housing through [place] [phone].  [facility] Family [facility]- Call [phone]. Located at [address] [place], [place], [place] [address].  Room at the Inn-For Pregnant mothers. Call [phone]. Located at [address] [place], [address].  [facility]-For men in [place]. Call [phone]. [facility]-Shelter in [place]. Call [phone].  Home of [employer] for [employer] 4022370510. Office located at [address] [place], [address].  [employer] be agreeable to help with chores. Call [phone] ext. [phone].  Men's: [address] , [place] [address]. Women's: [facility]  [address] [place], [place] [address]  Crisis Services [facility]rapeutic Alternatives Mobile Crisis Management- [phone]  [facility] [address] [place], [place] [address]. Phone: [phone] Ruthellen RUTHELLEN LUIS [employer]ISTRY Address: [address] [place], [place] [address] Phone Number: [phone] Hours of Operation: Residents of [place] can come to obtain food Monday through Friday from 8:30am until 3:30pm. Photo ID and Social Security cards required for all residents of a household. Can come six times a year  THE BLESSED TABLE Address: [address] [place], [place] [address] Phone Number: [phone] Hours of Operation: Operates Tuesday-Friday 10:00 a.m. to 1 p.m. Requirements: Referral from DSS needed. May come 6 times a year, 30 days apart. Photo ID and SS required for all residents of household.  [facility] Address: [address] [place], [place] [address] Phone Number: [phone] Hours of Operation: Food pantry is open on the last Saturday of each month from 10:00 am - 12:00 noon. No appointment needed. No qualifications.  [facility] Address: [address] [place], [place] [address] Phone Number: [phone]. [phone] Hours of Operation: Must make reservations to pick up food on Saturdays. Sign ups for Saturday pick up beginning at 8:30 a.m. on Monday morning.  [facility] Address: [address] [place], [place] [address] Phone Number: [phone] Hours of Operation: If you need food, bring proper identification such as a driver's license to receive a bag of food once a month. Requirements: Can come once every 30 days with referral DSS, Holiday representative, Mental health etc. Each referral good for six visits. Photo ID required. *1st visit no referral required.  [facility] Address: [address] [place], [place] [address] Phone Number: [phone]  [facility] Address: [address] [place], [place] [address] Phone Number: [phone] Hours of Operation:  You can register at [email or web address]/ for free groceries  FREE INDEED FOOD PANTRY Address: [address] [place], [place] [address] Phone Number: [phone] Hours of Operation: Drive through giveaway, first come first served. Every 3rd Saturday 11AM - 1PM  [facility] [address] Address: [address] [place] [address] Phone Number: [phone]   [place]  HAND TO HAND FOOD PANTRY Address: [address] [place], [place] [address] Phone Number: [phone] Hours of Operation: Once a month every 3rd Saturday  [facility] Address: [address] [place], [place] [address] Phone Number: [phone] Hours of Operation: Distribution happens from 9:00-10:00 a.m. every Saturday.     HELP[facility]S Address: [address] [place], [place] [address] Phone Number: [phone] Hours of Operation: ONCE a week for the community food distribution held every Tuesday, Wednesday and Thursday from 11 a.m. - 2:00 p.m. Food is available on a first come, first serve basis and varies week to week. No appointment necessary for drive thru pick up.  [facility] Address: [address] [place], [place] [address] Phone Number: [phone] Hours of Operation: Open every 3rd Thursday 9:30 a.m. - 11:00 a.m.  [facility] Address: [address] [place], [place] [address] Phone Number: [phone] Hours of Operation: Please call for hours, directions, and questions  GREATER [place] FOOD ALLIANCE Address: [address] [place], [place]  [address] Phone Number: [phone] Website: https://www.Hollyguns.co.za Food Finder app: https://findfood.ghpfa.org  [employer] Address: [address] [place], [place] [address] Phone Number: [phone] Hours of Operation: Contact Bree Harpe. Enrolled Substance Abuse Clients Only  [facility] Address: [address] [place], [address]  Phone Number: [phone] Hours of Operation: Contact Bartley Irving. Food pantry open the 3rd Saturday of each month from 9 a.m. -12 p.m.  only  [place] [facility] Address: [address] [place], [place] [address] Phone Number: [phone] Hours of Operation: Contact Geni Lee. Emergency food bank open on Saturdays by appointment only  [facility] Address: [address] [place], [place] [address] Phone Number: [phone] Hours of Operation: No specific contact person; Anyone can help  [facility] Address: [address] [place], [place] [address] Phone Number: [phone] Hours of Operation: Contact Medford Molt. Agency gives out a bag of food every Thursday from 2-4 p.m. only, and also provides a community meal every Thursday between 5-6 p.m. Other services provided include rent/mortgage and utility assistance, women's winter shelter, thrift store, and senior adult activities.  [facility] OF [place] Address: [address] [place], [place] [address] Phone Number: [phone] Hours of Operation: The Emergency Food Assistance Program provides individuals and families with a generous supply of food including meat, fresh vegetables, and nonperishable items. The food box contains five days' worth of food, and each family or individual can receive a box once per month. M, W, Th, Fr 11am-2pm, walk-ins welcome.  [facility] Address: [address] [place], [place] [address]  Phone Number: [phone] Hours of Operation: Contact Asia Cathlean. Tuesdays and Thursdays from 11am - 3pm by appointment only

## 2023-11-01 NOTE — Progress Notes (Addendum)
 Daily Progress Note Intern Pager: [phone]  Patient name: Melissa Gaines Medical record number: [id number] Date of birth: [birth year] Age: 53 y.o. Gender: female  Primary Care Provider: Alba Sharper, MD Consultants: general surgery Code Status: Full  Pt Overview and Major Events to Date:  8/24 - admitted 8/25 - NG clamped, CLD  Medical Decision Making:  53 yo female with abdominal pain found to have a partial SBO, now clinically improving and attempting PO. Pertinent PMH/PSH includes asthma, antiphospholipid syndrome/history of PE, GERD.  Assessment & Plan Small bowel obstruction (HCC) She is improving and passing flatus. Attempting PO today. Epigastric pain may be due to hiatal hernia. She did tolerate taking oral tylenol  - general surgery following, appreciate recs - NG tube removed - advanced to full liquid diet, no longer receiving IVF - She tolerated PO meds, with restart her home meds: ezetimibe , trazadone  - Transition Protonix  40 mg BID to PO - Pain: transition to  PO dilaudid  1 mg q6h prn, tylenol  1000 mg q5h prn - Transition to Zofran  4 mg PO q8h PRN Chronic health problem Asthma: Continue home inhalers GERD: Patient reportedly not taking 40 mg twice daily Protonix  at home, is taking OP famotidine  20 mg daily Vitamin D  deficiency: Restart vitamin D  supplementation once able to p.o. Migraine: Patient reportedly not taking p.o. migraine medication  SLE: Follows with [facility], does not appear to be taking active DMARDs. Prior history of Plaquenil /prednisone  use. Hyperlipidemia: Patient taking Zetia  but not atorvastatin  at home History of hematuria/renal cyst: Looks like patient was seen on 07/23/2020 by [facility]-Kate Debarah, was treated for trichomonas and supposed to follow-up but does not appear that she has done so.  She was supposed to have repeat labs, if migraine was present they would do hematuria workup.  Do  not believe this is related to her current presentation, but will put on follow-up recommendations. Antiphospholipid syndrome: Resume home Eliquis  5 mg twice a day, discontinue heparin   FEN/GI: FLD PPx: heparin  infusion Dispo:Home pending clinical improvement .   Subjective:  She reports persistent epigastric pain. No nausea/vomiting since yesterday. Passing flatus. No BM. No SOB.  Of note, she did not take eliquis  prior to admission. She was originally on eliquis  after a PE (patient reports it was not definitive that she had a PE). She [place] in [place] discontinued this medication, and she has not taken it for the last 18 years. The provider when she moved to [place] restarted the Eliquis , but she has not taken it.   Objective: Temp:  [97.5 F (36.4 C)-98.2 F (36.8 C)] 97.8 F (36.6 C) (08/26 0747) Pulse Rate:  [57-76] 59 (08/26 0747) Resp:  [16-18] 16 (08/26 0747) BP: (91-137)/(60-78) 137/78 (08/26 0747) SpO2:  [87 %-96 %] 94 % (08/26 0747) Physical Exam: General: well appearing woman lying in bed Cardiovascular: RRR Respiratory: CTAB, no increased WOB Abdomen: soft, moderate tenderness diffusely, intermittent guarding, no rebound  Laboratory: Most recent CBC Lab Results  Component Value Date   WBC 5.5 11/01/2023   HGB 12.6 11/01/2023   HCT 38.3 11/01/2023   MCV 87.0 11/01/2023   PLT 217 11/01/2023   Most recent BMP    Latest Ref Rng & Units 11/01/2023    6:28 AM  BMP  Glucose 70 - 99 mg/dL 89   BUN 6 - 20 mg/dL <5   Creatinine 9.55 - 1.00 mg/dL 9.42   Sodium 864 - 854 mmol/L 138   Potassium  3.5 - 5.1 mmol/L 4.0   Chloride 98 - 111 mmol/L 103   CO2 22 - 32 mmol/L 27   Calcium  8.9 - 10.3 mg/dL 8.5     Imaging/Diagnostic Tests: None  Alena Morrison, [place], MD 11/01/2023, 8:03 AM  PGY-1, [facility] FPTS Intern pager: [phone], text pages welcome Secure chat group [facility] Teaching Service

## 2023-11-01 NOTE — Progress Notes (Incomplete)
 PHARMACY - ANTICOAGULATION CONSULT NOTE  Pharmacy Consult for heparin  Indication: Antiphospholipid antibody  Allergies  Allergen Reactions   Fish Allergy  Anaphylaxis, Shortness Of Breath and Swelling    Throat closes Specifically salmon and tilapia     Patient Measurements: Height: 4' 9 (144.8 cm) Weight: 85.3 kg (188 lb) IBW/kg (Calculated) : 38.6 HEPARIN  DW (KG): 59.4  Vital Signs: Temp: 97.8 F (36.6 C) (08/26 0747) Temp Source: Oral (08/26 0747) BP: 137/78 (08/26 0747) Pulse Rate: 59 (08/26 0747)  Labs: Recent Labs    10/30/23 0808 10/30/23 0808 10/30/23 1036 10/31/23 0021 10/31/23 0923 10/31/23 1224 10/31/23 2122 11/01/23 0628  HGB 13.5  --   --  12.9  --   --   --  12.6  HCT 41.1  --   --  39.6  --   --   --  38.3  PLT 271  --   --  284  --   --   --  217  APTT  --   --   --  42* 60*  --   --   --   HEPARINUNFRC  --    < >  --  <0.10* 0.15*  --  0.24* 0.42  CREATININE 0.59  --   --  0.47  --  0.42*  --  0.57  TROPONINIHS <2  --  <2  --   --   --   --   --    < > = values in this interval not displayed.    Estimated Creatinine Clearance: 75.3 mL/min (by C-G formula based on SCr of 0.57 mg/dL).   Assessment: 9 YOF presenting with abdominal pain, concern for SBO, on Eliquis  PTA for antiphospholipid syndrome. Transition to heparin  while NPO for SBO eval.   Heparin  level therapeutic at 0.42 on infusion at 1450 units/hr. No issues with line or bleeding reported per RN.  Goal of Therapy:  Heparin  level 0.3-0.7 units/ml Monitor platelets by anticoagulation protocol: Yes   Plan:  *** heparin  infusion at *** units/hr  Check heparin  level in *** hours and daily while on heparin   Continue to monitor H&H and platelets   Thank you for allowing pharmacy to be a part of this patient's care    Prentice DOROTHA Favors, PharmD PGY1 Health-System Pharmacy Administration and Leadership Resident [facility]  11/01/2023 8:29 AM

## 2023-11-01 NOTE — TOC CM/SW Note (Addendum)
 Transition of Care [facility]) - Inpatient Brief Assessment   Patient Details  Name: Leonia Heatherly MRN: [id number] Date of Birth: [birth year]  Transition of Care [facility]) CM/SW Contact:    Lauraine FORBES Saa, LCSWA Phone Number: 11/01/2023, 4:42 PM   Clinical Narrative:  4:42 PM Per chart review, patient resides at home with child(ren). Patient has a PCP and insurance. Patient does not have SNF or HH history. Patient has DME (rolling walker, tub bench, BSC, nebulizer) history with [facility] and [facility]. Patient expressed interest in back rest of rolling walker. CSW relayed interest to [facility] and medical team. Patient's preferred pharmacy's are [facility] [address] and [facility] [place]. CSW provided SDOH (food and housing) transportation. Patient expressed interest in taxi voucher for discharge. Patient stated she uses Medicaid transportation to attend appointments. Patient stated that she receives fod stamps. TOC will continue to follow and be available to assist.  Transition of Care Asessment: Insurance and Status: Insurance coverage has been reviewed Patient has primary care physician: Yes Home environment has been reviewed: Private Residence Prior level of function:: N/A Prior/Current Home Services: No current home services Social Drivers of Health Review: SDOH reviewed interventions complete Readmission risk has been reviewed: Yes (Currently Green 13%) Transition of care needs: transition of care needs identified, TOC will continue to follow

## 2023-11-01 NOTE — Progress Notes (Signed)
 Progress Note     Subjective: Patient reports improvement in abdominal pain. Her last bowel movement was 8/24.Reports flatulence. Tolerating CLD. Denies nausea and vomiting.   ROS  All negative with the exception of above.  Objective: Vital signs in last 24 hours: Temp:  [97.5 F (36.4 C)-98.2 F (36.8 C)] 97.8 F (36.6 C) (08/26 0747) Pulse Rate:  [57-76] 59 (08/26 0747) Resp:  [16-18] 16 (08/26 0747) BP: (91-137)/(60-78) 137/78 (08/26 0747) SpO2:  [87 %-96 %] 94 % (08/26 0747) Last BM Date : 10/29/23  Intake/Output from previous day: No intake/output data recorded. Intake/Output this shift: No intake/output data recorded.  PE: General: Pleasant female who is laying in bed in NAD. Lungs: Respiratory effort nonlabored Abd: Soft with mild distention. Generalized tenderness to palpation that is more prominent in the epigastric region. No guarding or rebound tenderness.  Psych: A&Ox3 with an appropriate affect.    Lab Results:  Recent Labs    10/31/23 0021 11/01/23 0628  WBC 8.2 5.5  HGB 12.9 12.6  HCT 39.6 38.3  PLT 284 217   BMET Recent Labs    10/31/23 1224 11/01/23 0628  NA 137 138  K 3.8 4.0  CL 104 103  CO2 28 27  GLUCOSE 77 89  BUN <5* <5*  CREATININE 0.42* 0.57  CALCIUM  8.4* 8.5*   PT/INR No results for input(s): LABPROT, INR in the last 72 hours. CMP     Component Value Date/Time   NA 138 11/01/2023 0628   NA 142 07/01/2021 1457   K 4.0 11/01/2023 0628   CL 103 11/01/2023 0628   CO2 27 11/01/2023 0628   GLUCOSE 89 11/01/2023 0628   BUN <5 (L) 11/01/2023 0628   BUN 8 07/01/2021 1457   CREATININE 0.57 11/01/2023 0628   CREATININE 0.64 10/21/2023 0000   CALCIUM  8.5 (L) 11/01/2023 0628   PROT 6.9 10/30/2023 0808   PROT 7.8 07/01/2021 1457   ALBUMIN 3.4 (L) 10/30/2023 0808   ALBUMIN 4.5 07/01/2021 1457   AST 17 10/30/2023 0808   AST 16 05/18/2023 1016   ALT 12 10/30/2023 0808   ALT 15 05/18/2023 1016   ALKPHOS 50 10/30/2023  0808   BILITOT 0.4 10/30/2023 0808   BILITOT 0.4 05/18/2023 1016   GFRNONAA >60 11/01/2023 0628   GFRNONAA >60 05/18/2023 1016   GFRAA >60 10/26/2019 0932   Lipase     Component Value Date/Time   LIPASE 41 10/30/2023 0808       Studies/Results: DG Abd 1 View Result Date: 10/31/2023 CLINICAL DATA:  Check NGT insertion. EXAM: ABDOMEN - 1 VIEW COMPARISON:  Portable abdomen yesterday 9:16 p.m. FINDINGS: 7:14 a.m. NGT is at the upper margin of the film. 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 There is still dilatation of small bowel segments up to 3 cm, previously 3.6 cm. Enteric contrast has cleared out of the small bowel on the current exam with scattered contrast and gas within the nondilated colon through to the rectum. There is no supine evidence of free air. No other significant radiographic findings. The true pelvis was mostly excluded from the exam. IMPRESSION: 1. NGT is coiled within the gastric remnant. 2. Persistent dilatation of small bowel segments up to 3 cm, previously 3.6 cm. 3. Enteric contrast has cleared out of the small bowel since the prior study. Electronically Signed   By: Francis Quam M.D.   On: 10/31/2023 07:40   DG Abd Portable  1V-Small Bowel Obstruction Protocol-initial, 8 hr delay Result Date: 10/30/2023 CLINICAL DATA:  8 hour small-bowel follow-up film EXAM: PORTABLE ABDOMEN - 1 VIEW COMPARISON:  Film from earlier in the same day. FINDINGS: Gastric catheter remains in the stomach. Previously administered contrast lies partially within the mildly dilated small bowel although has reached the proximal ascending colon. The distal ileal loops appear within normal limits. IMPRESSION: Changes consistent with partial small bowel obstruction. Some persistent small bowel dilatation is noted proximally similar to that seen on prior CT. Electronically Signed   By: Oneil Devonshire M.D.   On: 10/30/2023 21:26   DG Abd  Portable 1 View Result Date: 10/30/2023 EXAM: 1 VIEW XRAY OF THE ABDOMEN 10/30/2023 12:14:00 PM COMPARISON: Early film of the same day. CLINICAL HISTORY: Check NG tube placement. FINDINGS: BOWEL: A few distended small bowel loops in the left mid abdomen. SOFT TISSUES: No opaque urinary calculi. BONES: No acute osseous abnormality. LINES AND TUBES: Gastric tube seen to the fundus of the decompressed stomach. IMPRESSION: 1. Gastric tube in appropriate position. 2. A few distended small bowel loops in the left mid abdomen. No bowel obstruction. Electronically signed by: Katheleen Faes MD 10/30/2023 12:51 PM EDT RP Workstation: [id number]   DG Abd Portable 1V-Small Bowel Protocol-Position Verification Result Date: 10/30/2023 EXAM: 1 VIEW XRAY OF THE ABDOMEN 10/30/2023 12:12:00 PM COMPARISON: CT from earlier the same day. CLINICAL HISTORY: Encounter for imaging study to confirm nasogastric (NG) tube placement. FINDINGS: BOWEL: A few distended mid abdominal small bowel loops. Nonobstructive bowel gas pattern. SOFT TISSUES: No opaque urinary calculi. BONES: No acute osseous abnormality. LINES AND TUBES: Nasogastric tube with tip near the gastroesophageal junction. IMPRESSION: 1. Decompressed stomach with tip of the nasogastric tube near the GE junction. 2. A few distended mid abdominal small bowel loops. No bowel obstruction. Electronically signed by: Katheleen Faes MD 10/30/2023 12:50 PM EDT RP Workstation: [id number]   CT ABDOMEN PELVIS W CONTRAST Result Date: 10/30/2023 EXAM: CT ABDOMEN AND PELVIS WITH CONTRAST 10/30/2023 09:57:33 AM TECHNIQUE: CT of the abdomen and pelvis was performed with the administration of intravenous contrast. Multiplanar reformatted images are provided for review. Automated exposure control, iterative reconstruction, and/or weight-based adjustment of the mA/kV was utilized to reduce the radiation dose to as low as reasonably achievable. COMPARISON: None available. CLINICAL HISTORY: Bowel  obstruction suspected. FINDINGS: LOWER CHEST: Mild dependent atelectasis in the lung bases. LIVER: Normal size and contour. GALLBLADDER AND BILE DUCTS: No wall thickening. No cholelithiasis. No biliary ductal dilatation. SPLEEN: Normal size. No focal lesion. PANCREAS: No mass. No ductal dilatation. ADRENAL GLANDS: Normal appearance. No mass. KIDNEYS, URETERS AND BLADDER: No stones in the kidneys or ureters. No hydronephrosis. No perinephric or periureteral stranding. Urinary bladder is unremarkable. GI AND BOWEL: Moderate hiatal hernia, post gastric surgery. Proximal small bowel decompressed. A few fluid distended mid abdominal small bowel loops with fecalization of a loop in the right central pelvis, beyond which there is abrupt transition to decompressed bowel. PERITONEUM AND RETROPERITONEUM: There is a trace adjacent amount of free fluid in the right pelvis without evident loculation or peripheral enhancement. No free air. VASCULATURE: Aorta is normal in caliber. LYMPH NODES: No lymphadenopathy. REPRODUCTIVE ORGANS: No significant abnormality. BONES AND SOFT TISSUES: Bilateral hip arthroplasty. Mild lumbar dextroscoliosis. No acute osseous abnormality. No focal soft tissue abnormality. IMPRESSION: 1. Early partial mid Small bowel obstruction with transition point in the right central pelvis, no mass or evident etiology suggesting adhesion. 2. Trace free fluid in the right pelvis without  evident loculation or peripheral enhancement. 3. Moderate hiatal hernia, post gastric surgery. Electronically signed by: Katheleen Faes MD 10/30/2023 10:37 AM EDT RP Workstation: [id number]    Anti-infectives: Anti-infectives (From admission, onward)    None        Assessment/Plan SBO vs pSBO - CT 8/24 showed early partial mid SBO with transition point in the right central pelvis, no mass or evident etiology suggesting adhesion. Trace free fluid in the right pelvis without evident loculation or peripheral enhancement.  Moderate hiatal hernia, post gastric surgery. - DG abd port SBO protocol 8/25 showed gastric catheter remains in the stomach. Previously administered contrast lies partially within the mildly dilated small bowel although has reached the proximal ascending colon -DG Abd from 8/25 showed NGT coiled within in gastric remnant. Persistent dilatation of small bowel segments up to 3 cm, previously 3.6 cm. Enteric contrast has cleared out of the small bowel since the prior study. -CBC WNL -Afebrile. Vitals stable. -NGT clamp trial successful and removed 8/25. Tolerating CLD; Has not had BM since 8/24. Reports flatulence. Improved pain. -Will advance to FLD     FEN: Advance to FLD; IVF per hospital team VTE: Heparin  infusions ID: None currently    LOS: 2 days   I reviewed hospitalist notes, last 24 h vitals and pain scores, last 48 h intake and output, last 24 h labs and trends, and last 24 h imaging results.  This care required moderate level of medical decision making.    Marjorie Carlyon Favre, [facility] 11/01/2023, 9:07 AM Please see Amion for pager number during day hours 7:00am-4:30pm

## 2023-11-01 NOTE — TOC CM/SW Note (Addendum)
 Transition of Care [facility]) - Inpatient Brief Assessment   Patient Details  Name: Melissa Gaines MRN: [id number] Date of Birth: 1970/07/02  Transition of Care [facility]) CM/SW Contact:    Tom-Johnson, Harvest Muskrat, RN Phone Number: 11/01/2023, 4:25 PM   Clinical Narrative:  Patient presents to the ED with worsening Abdominal pain. Found to have partial Small Bowel Obstruction.  General Sx consulted, NG tube was inserted, removed yesterday. Advancing diet to Full Liquid diet. On Eliquis  for hx of PE.  From home with her two children. Has five supportive siblings. Not employed, on disability. Has a cane, rollator.  PCP is Alba Sharper, MD and uses [facility] on [address] and [facility] delivery.   No ICM needs or recommendations noted at this time.  Patient not Medically ready for discharge.  CM will continue to follow as patient progresses with care towards discharge.   16:48- Notified by LCSW that patient requesting the back of her rollator repaired. CM notified Arthea with [employer] and [facility] will f/u tomorrow.  CM will continue to follow.           Transition of Care Asessment: Insurance and Status: Insurance coverage has been reviewed Patient has primary care physician: Yes Home environment has been reviewed: Yes Prior level of function:: Modified Independent Prior/Current Home Services: No current home services     Transition of care needs: no transition of care needs at this time

## 2023-11-01 NOTE — Assessment & Plan Note (Addendum)
 She is improving and passing flatus. Attempting PO today. Epigastric pain may be due to hiatal hernia. She did tolerate taking oral tylenol  - general surgery following, appreciate recs - NG tube removed - advanced to full liquid diet, no longer receiving IVF - She tolerated PO meds, with restart her home meds: ezetimibe , trazadone  - Transition Protonix  40 mg BID to PO - Pain: transition to  PO dilaudid  1 mg q6h prn, tylenol  1000 mg q5h prn - Transition to Zofran  4 mg PO q8h PRN

## 2023-11-02 ENCOUNTER — Inpatient Hospital Stay

## 2023-11-02 DIAGNOSIS — K56609 Unspecified intestinal obstruction, unspecified as to partial versus complete obstruction: Secondary | ICD-10-CM | POA: Diagnosis not present

## 2023-11-02 LAB — CBC
HCT: 38.2 % (ref 36.0–46.0)
Hemoglobin: 12.7 g/dL (ref 12.0–15.0)
MCH: 28.8 pg (ref 26.0–34.0)
MCHC: 33.2 g/dL (ref 30.0–36.0)
MCV: 86.6 fL (ref 80.0–100.0)
Platelets: 233 K/uL (ref 150–400)
RBC: 4.41 MIL/uL (ref 3.87–5.11)
RDW: 13.5 % (ref 11.5–15.5)
WBC: 5.4 K/uL (ref 4.0–10.5)
nRBC: 0 % (ref 0.0–0.2)

## 2023-11-02 NOTE — Progress Notes (Addendum)
     Daily Progress Note Intern Pager: [phone]  Patient name: Melissa Gaines Medical record number: [id number] Date of birth: [birth year] Age: 53 y.o. Gender: female  Primary Care Provider: Alba Sharper, MD Consultants: general surgery Code Status: full  Pt Overview and Major Events to Date:  8/24 - admitted 8/25 - NG clamped, CLD 8/26 -  FLD  Medical Decision Making:  52 yo female with improving partial SBO. Pertinent PMH/PSH includes asthma, antiphospholipid syndrome/history of PE, GERD.  Assessment & Plan Small bowel obstruction (HCC) Progressing well. Had a BM and flatus. Some pain with eating yesterday but no nausea or vomiting.  - general surgery following, appreciate recs - advance to soft diet today - hopeful for potential discharge if she tolerates her diet Chronic health problem Asthma: Continue home inhalers GERD: Patient reportedly not taking 40 mg twice daily Protonix  at home, is taking OP famotidine  20 mg daily Vitamin D  deficiency: Restart vitamin D  supplementation once able to p.o. Migraine: Patient reportedly not taking p.o. migraine medication  SLE: Follows with [facility], does not appear to be taking active DMARDs. Prior history of Plaquenil /prednisone  use. Hyperlipidemia: Patient taking Zetia  but not atorvastatin  at home History of hematuria/renal cyst: Looks like patient was seen on 07/23/2020 by [facility] urology-Kate Debarah, was treated for trichomonas and supposed to follow-up but does not appear that she has done so.  She was supposed to have repeat labs, if migraine was present they would do hematuria workup.  Do not believe this is related to her current presentation, but will put on follow-up recommendations. Antiphospholipid syndrome: home Eliquis  5 mg twice a day. Discontinue daily CBC as she is off heparin .   FEN/GI: GI soft PPx: eliquis  Dispo:Home pending clinical improvement .   Subjective:  Had a BM  and flatus. No nausea/vomiting. Some pain with eating but only a little.   Objective: Temp:  [97.8 F (36.6 C)-98.2 F (36.8 C)] 97.8 F (36.6 C) (08/27 0836) Pulse Rate:  [57-126] 74 (08/27 0836) Resp:  [18-20] 20 (08/27 0836) BP: (92-104)/(51-92) 102/92 (08/27 0836) SpO2:  [90 %-96 %] 92 % (08/27 0846) Physical Exam: General: alert and awake in bed Cardiovascular: RRR Respiratory: CTAB Abdomen: soft, mild tenderness in the epigastrium, no guarding or rebound Extremities: no edema  Laboratory: Most recent CBC Lab Results  Component Value Date   WBC 5.4 11/02/2023   HGB 12.7 11/02/2023   HCT 38.2 11/02/2023   MCV 86.6 11/02/2023   PLT 233 11/02/2023   Most recent BMP    Latest Ref Rng & Units 11/01/2023    6:28 AM  BMP  Glucose 70 - 99 mg/dL 89   BUN 6 - 20 mg/dL <5   Creatinine 9.55 - 1.00 mg/dL 9.42   Sodium 864 - 854 mmol/L 138   Potassium 3.5 - 5.1 mmol/L 4.0   Chloride 98 - 111 mmol/L 103   CO2 22 - 32 mmol/L 27   Calcium  8.9 - 10.3 mg/dL 8.5     Imaging/Diagnostic Tests: None   Alena Morrison, Elio, MD 11/02/2023, 9:47 AM  PGY-1, [facility] FPTS Intern pager: [phone], text pages welcome Secure chat group [facility] Teaching Service

## 2023-11-02 NOTE — Discharge Summary (Signed)
 Family Medicine Teaching [facility] Discharge Summary  Patient name: Melissa Gaines Medical record number: [id number] Date of birth: October 24, 1970 Age: 53 y.o. Gender: female Date of Admission: 10/30/2023  Date of Discharge: 11/02/23 Admitting Physician: Melissa ONEIDA Fairly, MD  Primary Care Provider: Alba Sharper, MD Consultants: general surgery  Indication for Hospitalization: small bowel obstruction  Discharge Diagnoses/Problem List:  Principal Problem for Admission: small bowel obstruction Other Problems addressed during stay:  Principal Problem:   Small bowel obstruction (HCC) Active Problems:   Antiphospholipid antibody syndrome [facility])   Asthma   Brief Hospital Course:  Melissa Gaines is a 53 y.o. female with history of antiphospholipid syndrome/PE, HLD, asthma, GERD who was admitted for small bowel obstruction.  Abdominal pain  SBO Patient presented to ED with vomiting, poor p.o. intake and abdominal pain.  Initial lab work unremarkable aside from elevated lactic acid.  CT abdomen with contrast obtained which showed partial small bowel obstruction.  NG tube placed, and general surgery consulted who recommended SBO protocol with Gastrografin . NGT clamped then removed on 10/31/23. Diet progressed to GI soft on 11/02/23, and patient tolerated well. Surgery and primary team were comfortable with discharge home on 8/27.    Antiphospholipid Syndrome  Hx of PE Patient on heparin  drip inpatient while NPO. Reporting she did not take Eliquis  outpatient for the last 18 years prior to admission and will not take it when she leaves. Patient was educated on the importance of anticoagulation and risk of recurrent PE or other VTE. Will encourage continued discussion with PCP regarding risks.   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    Results/Tests Pending at Time of Discharge:  Unresulted Labs (From admission, onward)    None        Disposition: home  Discharge Condition: stable  Discharge Exam:  Vitals:   11/02/23 0846 11/02/23 1513  BP:  (!) 96/56  Pulse:  69  Resp:  18  Temp:  98.1 F (36.7 C)  SpO2: 92% 95%   General: well appearing, NAD Cardiovascular: RRR, no m/r/g Respiratory: normal work of breathing on RA Abdomen: soft, non-tender, non-distended  Significant Procedures: NGT placed, on admission, removed 8/25  Significant Labs and Imaging:  Recent Labs  Lab 11/01/23 0628 11/02/23 0431  WBC 5.5 5.4  HGB 12.6 12.7  HCT 38.3 38.2  PLT 217 233   Recent Labs  Lab 11/01/23 0628  NA 138  K 4.0  CL 103  CO2 27  GLUCOSE 89  BUN <5*  CREATININE 0.57  CALCIUM  8.5*    CXR 10/30/23 IMPRESSION: 1. Low volume lordotic film without acute cardiopulmonary findings. 2. Oblique linear lucency through the proximal humerus is probably artifact. Correlation for point tenderness recommended. If there is concern for proximal humerus fracture, dedicated left shoulder films recommended.  CT A/P W Contrast 10/30/23 IMPRESSION: 1. Early partial mid Small bowel obstruction with transition point in the right central pelvis, no mass or evident etiology suggesting adhesion. 2. Trace free fluid in the right pelvis without evident loculation or peripheral enhancement. 3. Moderate hiatal hernia, post gastric surgery.  XR Abdomen 10/31/23 IMPRESSION:  1. NGT is coiled within the gastric remnant. 2. Persistent dilatation of small bowel  segments up to 3 cm, previously 3.6 cm. 3. Enteric contrast has cleared out of the small bowel since the prior study.   Discharge Medications:  Allergies as of 11/02/2023       Reactions   Fish Allergy  Anaphylaxis, Shortness Of Breath, Swelling   Throat closes Specifically salmon and tilapia        Medication List     STOP taking these medications    acetaminophen -codeine 300-30 MG tablet Commonly known as: TYLENOL  #3   cromolyn  4 % ophthalmic solution Commonly known as: OPTICROM    dexamethasone  1 MG tablet Commonly known as: DECADRON    FLUoxetine  20 MG capsule Commonly known as: PROZAC    gabapentin  400 MG capsule Commonly known as: NEURONTIN    levocetirizine 5 MG tablet Commonly known as: XYZAL    pantoprazole  40 MG tablet Commonly known as: PROTONIX    Restasis  0.05 % ophthalmic emulsion Generic drug: cycloSPORINE    Ubrelvy  100 MG Tabs Generic drug: Ubrogepant    Voquezna  10 MG Tabs Generic drug: Vonoprazan Fumarate        TAKE these medications    Aimovig  140 MG/ML Soaj Generic drug: Erenumab -aooe Inject 140 mg into the skin every 30 (thirty) days.   albuterol  108 (90 Base) MCG/ACT inhaler Commonly known as: VENTOLIN  HFA Inhale 2 puffs into the lungs every 6 (six) hours as needed for wheezing or shortness of breath.   atorvastatin  80 MG tablet Commonly known as: LIPITOR Take 1 tablet (80 mg total) by mouth daily.   azelastine  0.1 % nasal spray Commonly known as: ASTELIN  Place 2 sprays into both nostrils 2 (two) times daily. Use in each nostril as directed What changed:  when to take this reasons to take this additional instructions   diclofenac  Sodium 1 % Gel Commonly known as: Voltaren  Arthritis Pain Apply 4 g topically 4 (four) times daily. What changed:  when to take this reasons to take this   Eliquis  5 MG Tabs tablet Generic drug: apixaban  TAKE 1 TABLET BY MOUTH TWICE  DAILY NEEDS PCP APPOINTMENT  BEFORE MORE FILLS    EPINEPHrine  0.3 mg/0.3 mL Soaj injection Commonly known as: EPI-PEN INJECT INTRAMUSCULARLY 1 PEN AS  NEEDED FOR ALLERGIC RESPONSE AS  DIRECTED BY MD. [facility]  HELP AFTER USE.   ezetimibe  10 MG tablet Commonly known as: Zetia  Take 1 tablet (10 mg total) by mouth daily.   famotidine  20 MG tablet Commonly known as: PEPCID  TAKE 1 TABLET BY MOUTH TWICE  DAILY What changed: when to take this   ferrous sulfate  325 (65 FE) MG tablet Take 1 tablet (325 mg total) by mouth 3 (three) times daily with meals. What changed: when to take this   fluticasone  50 MCG/ACT nasal spray Commonly known as: FLONASE  Place 2 sprays into both nostrils daily. What changed:  when to take this reasons to take this   traZODone  100 MG tablet Commonly known as: DESYREL  Take 1 tablet (100 mg total) by mouth at bedtime.   VITAMIN D  PO Take 1 capsule by mouth every Thursday.        Discharge Instructions: Please refer to Patient Instructions section of EMR for full details.  Patient was counseled important signs and symptoms that should prompt return to medical care, changes in medications, dietary instructions, activity restrictions, and follow up appointments.   Follow-Up Appointments: 11/16/23 PCP Follow Up  Stoney Blizzard, DO 11/02/2023, 6:31 PM PGY-2, [facility] Family Medicine

## 2023-11-02 NOTE — Care Management Important Message (Signed)
 Important Message  Patient Details  Name: Melissa Gaines MRN: [id number] Date of Birth: [birth year]   Important Message Given:  Yes - Medicare IM     Claretta Deed 11/02/2023, 4:14 PM

## 2023-11-02 NOTE — TOC Progression Note (Signed)
 Transition of Care [facility]) - Progression Note    Patient Details  Name: Melissa Gaines MRN: [id number] Date of Birth: 1970-04-16  Transition of Care [facility]) CM/SW Contact  Rosaline JONELLE Joe, RN Phone Number: 11/02/2023, 10:49 AM  Clinical Narrative:    CM met with the patient to discuss patient's need for backrest on Rolator.  Patient states that she did not receive one when [employer] delivered to the patient in July.  I called [employer] and they state that each rolator comes with a built in backrest and are unable to order the backrest separately.  Patient was advised to order a backrest from [facility] since [employer] unable to order or assist.  No other IP Care management needs. Patient should discharge home with family when medically stable.                     Expected Discharge Plan and Services                                               Social Drivers of Health (SDOH) Interventions SDOH Screenings   Food Insecurity: Food Insecurity Present (10/30/2023)  Housing: High Risk (10/30/2023)  Transportation Needs: Unmet Transportation Needs (10/30/2023)  Utilities: Not At Risk (10/30/2023)  Alcohol Screen: Low Risk  (09/19/2023)  Depression (PHQ2-9): Low Risk  (10/17/2023)  Financial Resource Strain: Medium Risk (09/19/2023)  Physical Activity: Inactive (09/19/2023)  Social Connections: Socially Isolated (09/19/2023)  Stress: No Stress Concern Present (09/19/2023)  Tobacco Use: Low Risk  (10/26/2023)    Readmission Risk Interventions    11/02/2023   10:49 AM 11/01/2023    4:25 PM  Readmission Risk Prevention Plan  Post Dischage Appt Complete Complete  Medication Screening Complete Complete  Transportation Screening Complete Complete

## 2023-11-02 NOTE — Progress Notes (Signed)
 Progress Note     Subjective: Patient reports continued abdominal pain that is more prominent of epigastric region (known hiatal hernia). She had a bowel movement yesterday that was loose. Denies hematochezia and mucus in her stool. Reports flatulence. Tolerating FLD. Denies nausea and vomiting.   ROS  All negative with the exception of above.  Objective: Vital signs in last 24 hours: Temp:  [97.8 F (36.6 C)-98.2 F (36.8 C)] 97.8 F (36.6 C) (08/27 0836) Pulse Rate:  [57-126] 74 (08/27 0836) Resp:  [18-20] 20 (08/27 0836) BP: (92-104)/(51-92) 102/92 (08/27 0836) SpO2:  [90 %-96 %] 92 % (08/27 0846) Last BM Date : 10/29/23  Intake/Output from previous day: 08/26 0701 - 08/27 0700 In: 388 [P.O.:388] Out: -  Intake/Output this shift: No intake/output data recorded.  PE: General: Pleasant female who is laying in bed in NAD. Lungs: Respiratory effort nonlabored Abd: Soft with mild distention. Generalized tenderness to palpation that is more prominent in the epigastric region. No guarding or rebound tenderness.  Psych: A&Ox3 with an appropriate affect.    Lab Results:  Recent Labs    11/01/23 0628 11/02/23 0431  WBC 5.5 5.4  HGB 12.6 12.7  HCT 38.3 38.2  PLT 217 233   BMET Recent Labs    10/31/23 1224 11/01/23 0628  NA 137 138  K 3.8 4.0  CL 104 103  CO2 28 27  GLUCOSE 77 89  BUN <5* <5*  CREATININE 0.42* 0.57  CALCIUM  8.4* 8.5*   PT/INR No results for input(s): LABPROT, INR in the last 72 hours. CMP     Component Value Date/Time   NA 138 11/01/2023 0628   NA 142 07/01/2021 1457   K 4.0 11/01/2023 0628   CL 103 11/01/2023 0628   CO2 27 11/01/2023 0628   GLUCOSE 89 11/01/2023 0628   BUN <5 (L) 11/01/2023 0628   BUN 8 07/01/2021 1457   CREATININE 0.57 11/01/2023 0628   CREATININE 0.64 10/21/2023 0000   CALCIUM  8.5 (L) 11/01/2023 0628   PROT 6.9 10/30/2023 0808   PROT 7.8 07/01/2021 1457   ALBUMIN 3.4 (L) 10/30/2023 0808   ALBUMIN 4.5  07/01/2021 1457   AST 17 10/30/2023 0808   AST 16 05/18/2023 1016   ALT 12 10/30/2023 0808   ALT 15 05/18/2023 1016   ALKPHOS 50 10/30/2023 0808   BILITOT 0.4 10/30/2023 0808   BILITOT 0.4 05/18/2023 1016   GFRNONAA >60 11/01/2023 0628   GFRNONAA >60 05/18/2023 1016   GFRAA >60 10/26/2019 0932   Lipase     Component Value Date/Time   LIPASE 41 10/30/2023 0808       Studies/Results: No results found.  Anti-infectives: Anti-infectives (From admission, onward)    None        Assessment/Plan SBO vs pSBO - CT 8/24 showed early partial mid SBO with transition point in the right central pelvis, no mass or evident etiology suggesting adhesion. Trace free fluid in the right pelvis without evident loculation or peripheral enhancement. Moderate hiatal hernia, post gastric surgery. - DG abd port SBO protocol 8/25 showed gastric catheter remains in the stomach. Previously administered contrast lies partially within the mildly dilated small bowel although has reached the proximal ascending colon -DG Abd from 8/25 showed NGT coiled within in gastric remnant. Persistent dilatation of small bowel segments up to 3 cm, previously 3.6 cm. Enteric contrast has cleared out of the small bowel since the prior study. -CBC WNL -Afebrile. Vitals stable. -NGT clamp trial successful and  removed 8/25. Tolerating FLD. Reports bowel function and flatulence.  -Will advance to soft diet; Should patient tolerate diet without abdominal distention, n/v, increased pain, will discuss possible d/c. -Patient reports that she has a follow up arranged to further discuss her known hiatal hernia with GI (possible EGD planned). Will arrange for her to have follow up with our office to also discuss repair.     FEN: Soft; IVF per hospital team VTE: Eliquis  ID: None currently    LOS: 3 days   I reviewed nursing notes, specialist notes, hospitalist notes, last 24 h vitals and pain scores, last 48 h intake and  output, last 24 h labs and trends, and last 24 h imaging results.  This care required moderate level of medical decision making.    Melissa Gaines, [facility] Surgery 11/02/2023, 9:38 AM Please see Amion for pager number during day hours 7:00am-4:30pm

## 2023-11-02 NOTE — Assessment & Plan Note (Signed)
 Asthma: Continue home inhalers GERD: Patient reportedly not taking 40 mg twice daily Protonix  at home, is taking OP famotidine  20 mg daily Vitamin D  deficiency: Restart vitamin D  supplementation once able to p.o. Migraine: Patient reportedly not taking p.o. migraine medication  SLE: Follows with [facility] rheumatology, does not appear to be taking active DMARDs. Prior history of Plaquenil /prednisone  use. Hyperlipidemia: Patient taking Zetia  but not atorvastatin  at home History of hematuria/renal cyst: Looks like patient was seen on 07/23/2020 by [facility] urology-Kate Debarah, was treated for trichomonas and supposed to follow-up but does not appear that she has done so.  She was supposed to have repeat labs, if migraine was present they would do hematuria workup.  Do not believe this is related to her current presentation, but will put on follow-up recommendations. Antiphospholipid syndrome: home Eliquis  5 mg twice a day. Discontinue daily CBC as she is off heparin .

## 2023-11-02 NOTE — Assessment & Plan Note (Signed)
 Progressing well. Had a BM and flatus. Some pain with eating yesterday but no nausea or vomiting.  - general surgery following, appreciate recs - advance to soft diet today - hopeful for potential discharge if she tolerates her diet

## 2023-11-03 ENCOUNTER — Telehealth: Payer: Self-pay

## 2023-11-03 NOTE — Transitions of Care (Post Inpatient/ED Visit) (Addendum)
 11/03/2023  Name: Melissa Gaines MRN: [id number] DOB: 01/01/1971  Today's TOC FU Call Status: Today's TOC FU Call Status:: Successful TOC FU Call Completed TOC FU Call Complete Date: 11/03/23 Patient's Name and Date of Birth confirmed.  Transition Care Management Follow-up Telephone Call Date of Discharge: 11/02/23 Discharge Facility: [facility]) Type of Discharge: Inpatient Admission Primary Inpatient Discharge Diagnosis:: small bowel obstruction How have you been since you were released from the hospital?: Better Any questions or concerns?: No  Items Reviewed: Did you receive and understand the discharge instructions provided?: Yes Medications obtained,verified, and reconciled?: Yes (Medications Reviewed) Any new allergies since your discharge?: No Dietary orders reviewed?: Yes Type of Diet Ordered:: Heart Healthy Do you have support at home?: Yes People in Home [RPT]: friend(s) Name of Support/Comfort Primary Source: Has aide several days a week.  Unsure of  number of hours  Medications Reviewed Today: Medications Reviewed Today     Reviewed by Lindley Stachnik, RN (Case Manager) on 11/03/23 at 1532  Med List Status: <None>   Medication Order Taking? Sig Documenting Provider Last Dose Status Informant  albuterol  (VENTOLIN  HFA) 108 (90 Base) MCG/ACT inhaler [id number] Yes Inhale 2 puffs into the lungs every 6 (six) hours as needed for wheezing or shortness of breath. Donzetta Rollene BRAVO, MD  Active Self, Pharmacy Records  atorvastatin  (LIPITOR) 80 MG tablet [id number] Yes Take 1 tablet (80 mg total) by mouth daily. Alba Sharper, MD  Active   azelastine  (ASTELIN ) 0.1 % nasal spray [id number] Yes Place 2 sprays into both nostrils 2 (two) times daily. Use in each nostril as directed Lorin Norris, MD  Active Self, Pharmacy Records  diclofenac  Sodium (VOLTAREN  ARTHRITIS PAIN) 1 % GEL [id number]  Apply 4 g topically 4 (four) times daily.  Patient not taking:  Reported on 11/03/2023   Christia Budds, MD  Active Self, Pharmacy Records  ELIQUIS  5 MG TABS tablet [id number] Yes TAKE 1 TABLET BY MOUTH TWICE  DAILY NEEDS PCP APPOINTMENT  BEFORE MORE FILLS Christia Budds, MD  Active Self, Pharmacy Records           Med Note (COFFELL, JON HERO   Mon Oct 31, 2023 10:01 AM)    EPINEPHrine  0.3 mg/0.3 mL IJ SOAJ injection [id number] Yes INJECT INTRAMUSCULARLY 1 PEN AS  NEEDED FOR ALLERGIC RESPONSE AS  DIRECTED BY MD. [facility]  HELP AFTER USE. Cheryl Reusing, FNP  Active Self, Pharmacy Records           Med Note (SATTERFIELD, TEENA BRAVO Repress Oct 30, 2023  2:27 PM)    Erenumab -aooe (AIMOVIG ) 140 MG/ML EMMANUEL [id number] Yes Inject 140 mg into the skin every 30 (thirty) days. Skeet Juliene SAUNDERS, DO  Active Self, Pharmacy Records           Med Note (COFFELL, JON HERO   Mon Oct 31, 2023 10:13 AM) Last injection > 30 days, patient states she had several vaccines this month so she skipped this injection.  ezetimibe  (ZETIA ) 10 MG tablet [id number] Yes Take 1 tablet (10 mg total) by mouth daily. Alba Sharper, MD  Active Self, Pharmacy Records  famotidine  (PEPCID ) 20 MG tablet [id number] Yes TAKE 1 TABLET BY MOUTH TWICE  DAILY  Patient taking differently: Take 20 mg by mouth daily.   Lorin Norris, MD  Active Self, Pharmacy Records  ferrous sulfate  325 [phone] FE) MG tablet [id number] Yes Take 1 tablet (325 mg total) by mouth 3 (three) times daily with meals.  Patient taking differently:  Take 325 mg by mouth daily.   Burton, Lacie K, NP  Active Self, Pharmacy Records           Med Note (COFFELL, JON HERO   Mon Oct 31, 2023 10:10 AM)    fluticasone  (FLONASE ) 50 MCG/ACT nasal spray [id number] Yes Place 2 sprays into both nostrils daily.  Patient taking differently: Place 2 sprays into both nostrils daily as needed for allergies.   Henriette Mora, MD  Active Self, Pharmacy Records  traZODone  (DESYREL ) 100 MG tablet [id number] Yes Take 1 tablet (100 mg total) by mouth at  bedtime. Curry Leni DASEN, MD  Active Self, Pharmacy Records           Med Note (SATTERFIELD, TEENA FORBES Repress Oct 30, 2023  2:38 PM) Patient verified she is taking every night   VITAMIN D  PO [id number] Yes Take 1 capsule by mouth every Thursday. [provider]  Active Self, Pharmacy Records           Med Note (COFFELL, JON HERO   Mon Oct 31, 2023 10:14 AM) Patient states she takes an OTC vitamin D  taken once a week - unable to confirm if D2 or D3, unable to confirm strength.             Home Care and Equipment/Supplies: Were Home Health Services Ordered?: No Any new equipment or medical supplies ordered?: No  Functional Questionnaire: Do you need assistance with bathing/showering or dressing?: Yes (has aide) Do you need assistance with meal preparation?: No Do you need assistance with eating?: No Do you have difficulty maintaining continence: Yes (wears pad) Do you need assistance with getting out of bed/getting out of a chair/moving?: No Do you have difficulty managing or taking your medications?: No  Follow up appointments reviewed: PCP Follow-up appointment confirmed?: Yes Date of PCP follow-up appointment?: 11/16/23 Follow-up Provider: Dr. Alba Specialist [facility] Follow-up appointment confirmed?: Yes Date of Specialist follow-up appointment?: 11/04/23 Follow-Up Specialty Provider:: Dr. Luke Do you need transportation to your follow-up appointment?: No Do you understand care options if your condition(s) worsen?: Yes-patient verbalized understanding  SDOH Interventions Today    Flowsheet Row Most Recent Value  SDOH Interventions   Food Insecurity Interventions Inpatient TOC  [resources given inpatient- on AVS]  Housing Interventions Intervention Not Indicated  Transportation Interventions Payor Benefit  Utilities Interventions Intervention Not Indicated    Brilee Port J. Keenan Trefry RN, MSN [facility]  [facility] RN Care  Manager Direct Dial: [phone]  Fax: [phone] Website: [email or web address]

## 2023-11-03 NOTE — Patient Instructions (Signed)
 Visit Information  Thank you for taking time to visit with me today. Please don't hesitate to contact me if I can be of assistance to you before our next scheduled telephone appointment.  Our next appointment is by telephone on 11/09/23 at 2:00 pm  Following is a copy of your care plan:   Goals Addressed             This Visit's Progress    VBCI Transitions of Care (TOC) Care Plan       Problems:  Recent Hospitalization for treatment of Small Bowel Obstruction Knowledge Deficit Related to Small Bowel Obstruction  Goal:  Over the next 30 days, the patient will not experience hospital readmission  Interventions:  Transitions of Care: Doctor Visits  - discussed the importance of doctor visits Advised patient to call PCP for closer follow up appointment  Patient Self Care Activities:  Call pharmacy for medication refills 3-7 days in advance of running out of medications Call provider office for new concerns or questions  Notify RN Care Manager of TOC call rescheduling needs Participate in Transition of Care Program/Attend TOC scheduled calls Take medications as prescribed   Call your doctor now or seek immediate medical care if: You have a fever. You are vomiting. You have new or worse belly pain. You cannot pass stools or gas.  Plan:  Telephone follow up appointment with care management team member scheduled for:  11/09/23 At 2:00 pm        Patient verbalizes understanding of instructions and care plan provided today and agrees to view in MyChart. Active MyChart status and patient understanding of how to access instructions and care plan via MyChart confirmed with patient.     Follow up with provider re: Closer Hospital follow up appointment  Please call the care guide team at [phone] if you need to cancel or reschedule your appointment.   Please call the Suicide and Crisis Lifeline: 988 if you are experiencing a Mental Health or Behavioral Health Crisis or need  someone to talk to.  Willson Lipa J. Lujean Ebright RN, MSN [facility], [facility] RN Care Manager Direct Dial: [phone]  Fax: [phone] Website: [email or web address]

## 2023-11-03 NOTE — Progress Notes (Unsigned)
 Follow Up Note  RE: [name] MRN: [id number] DOB: September 16, 1970 Date of Office Visit: 11/04/2023  Referring provider: Christia Budds, MD Primary care provider: Alba Sharper, MD  Chief Complaint: No chief complaint on file.  History of Present Illness: I had the pleasure of seeing Melissa Gaines for a follow up visit at the [facility] on 11/04/2023. She is a 53 y.o. female, who is being followed for allergic rhinitis, food allergies, asthma, GERD. Her previous allergy  office visit was on 04/29/2023 with Dr. Lorin. Today is a regular follow up visit.  Discussed the use of AI scribe software for clinical note transcription with the patient, who gave verbal consent to proceed.  History of Present Illness            ***  Assessment and Plan: Melissa Gaines is a 53 y.o. female with: Perennial Rhinitis: improved  - Continue with: Xyzal  (levocetirizine) 5mg  tablet once daily and Flonase  (fluticasone ) two sprays per nostril daily,  Astelin  (azelastine ) 2 sprays per nostril 1-2 times daily as needed  Start  cromolyn  eye drops:1 drop per eye up to 4 times a day as needed  - You can use an extra dose of the antihistamine, if needed, for breakthrough symptoms.  - Consider nasal saline rinses 1-2 times daily to remove allergens from the nasal cavities as well as help with mucous clearance (this is especially helpful to do before the nasal sprays are given) -Continue allergy  injections and carry EpiPen  on allergy  injection today     Food allergy :  - please strictly avoid seafood given history of reactions.  - for SKIN only reaction, okay to take Benadryl 1 capsules every 6 hours - for SKIN + ANY additional symptoms, OR IF concern for LIFE THREATENING reaction = Epipen  Autoinjector EpiPen  0.3 mg. - If using Epinephrine  autoinjector, call 911 - A food allergy  action plan has been provided and discussed. - Medic Alert identification is  recommended.   Vertigo  - Will refer to ENT      Moderate Persistent Asthma: well  controlled - Breathing test today showed: looked great!   PLAN:  - Spacer use reviewed. - Daily controller medication(s): Symbicort  160/4.50mcg two puffs once daily - Prior to physical activity: albuterol  2 puffs 10-15 minutes before physical activity. - Rescue medications: albuterol  4 puffs every 4-6 hours as needed - Changes during respiratory infections or worsening symptoms: Increase Symbicort  to 3 puffs twice daily for TWO WEEKS. - Asthma control goals:  * Full participation in all desired activities (may need albuterol  before activity) * Albuterol  use two time or less a week on average (not counting use with activity) * Cough interfering with sleep two time or less a month * Oral steroids no more than once a year * No hospitalizations      GERD  - Continue pantoprazole  40mg  daily (take 30 minutes before you eat)  - Continue famotidine  20mg  at night  - Will refer to GI for persistent symptoms  Assessment and Plan              No follow-ups on file.  No orders of the defined types were placed in this encounter.  Lab Orders  No laboratory test(s) ordered today    Diagnostics: Spirometry:  Tracings reviewed. Her effort: {Blank single:19197::Good reproducible efforts.,It was hard to get consistent efforts and there is a question as to whether this reflects a maximal maneuver.,Poor effort, data can not be interpreted.} FVC: ***L FEV1: ***L, ***%  predicted FEV1/FVC ratio: ***% Interpretation: {Blank single:19197::Spirometry consistent with mild obstructive disease,Spirometry consistent with moderate obstructive disease,Spirometry consistent with severe obstructive disease,Spirometry consistent with possible restrictive disease,Spirometry consistent with mixed obstructive and restrictive disease,Spirometry uninterpretable due to technique,Spirometry consistent with  normal pattern,No overt abnormalities noted given today's efforts}.  Please see scanned spirometry results for details.  Skin Testing: {Blank single:19197::Select foods,Environmental allergy  panel,Environmental allergy  panel and select foods,Food allergy  panel,None,Deferred due to recent antihistamines use}. *** Results discussed with patient/family.   Medication List:  Current Outpatient Medications  Medication Sig Dispense Refill  . albuterol  (VENTOLIN  HFA) 108 (90 Base) MCG/ACT inhaler Inhale 2 puffs into the lungs every 6 (six) hours as needed for wheezing or shortness of breath. 8 g 2  . atorvastatin  (LIPITOR) 80 MG tablet Take 1 tablet (80 mg total) by mouth daily. 90 tablet 3  . azelastine  (ASTELIN ) 0.1 % nasal spray Place 2 sprays into both nostrils 2 (two) times daily. Use in each nostril as directed (Patient taking differently: Place 2 sprays into both nostrils daily as needed for allergies.) 30 mL 12  . diclofenac  Sodium (VOLTAREN  ARTHRITIS PAIN) 1 % GEL Apply 4 g topically 4 (four) times daily. (Patient taking differently: Apply 4 g topically daily as needed (for pain).) 350 g 0  . ELIQUIS  5 MG TABS tablet TAKE 1 TABLET BY MOUTH TWICE  DAILY NEEDS PCP APPOINTMENT  BEFORE MORE FILLS (Patient not taking: Reported on 10/31/2023) 60 tablet 11  . EPINEPHrine  0.3 mg/0.3 mL IJ SOAJ injection INJECT INTRAMUSCULARLY 1 PEN AS  NEEDED FOR ALLERGIC RESPONSE AS  DIRECTED BY MD. SEEK MEDICAL  HELP AFTER USE. 4 each 1  . Erenumab -aooe (AIMOVIG ) 140 MG/ML SOAJ Inject 140 mg into the skin every 30 (thirty) days. 1.12 mL 11  . ezetimibe  (ZETIA ) 10 MG tablet Take 1 tablet (10 mg total) by mouth daily. 90 tablet 0  . famotidine  (PEPCID ) 20 MG tablet TAKE 1 TABLET BY MOUTH TWICE  DAILY (Patient taking differently: Take 20 mg by mouth daily.) 180 tablet 3  . ferrous sulfate  325 (65 FE) MG tablet Take 1 tablet (325 mg total) by mouth 3 (three) times daily with meals. (Patient taking  differently: Take 325 mg by mouth daily.) 90 tablet 1  . fluticasone  (FLONASE ) 50 MCG/ACT nasal spray Place 2 sprays into both nostrils daily. (Patient taking differently: Place 2 sprays into both nostrils daily as needed for allergies.) 16 g 6  . traZODone  (DESYREL ) 100 MG tablet Take 1 tablet (100 mg total) by mouth at bedtime. 90 tablet 0  . VITAMIN D  PO Take 1 capsule by mouth every Thursday.     No current facility-administered medications for this visit.   Allergies: Allergies  Allergen Reactions  . Fish Allergy  Anaphylaxis, Shortness Of Breath and Swelling    Throat closes Specifically salmon and tilapia    I reviewed her past medical history, social history, family history, and environmental history and no significant changes have been reported from her previous visit.  Review of Systems  Constitutional:  Negative for appetite change, chills, fever and unexpected weight change.  HENT:  Negative for congestion and rhinorrhea.   Eyes:  Negative for itching.  Respiratory:  Negative for cough, chest tightness, shortness of breath and wheezing.   Cardiovascular:  Negative for chest pain.  Gastrointestinal:  Negative for abdominal pain.  Genitourinary:  Negative for difficulty urinating.  Skin:  Negative for rash.  Neurological:  Negative for headaches.    Objective: There were no vitals  taken for this visit. There is no height or weight on file to calculate BMI. Physical Exam Vitals and nursing note reviewed.  Constitutional:      Appearance: Normal appearance. She is well-developed.  HENT:     Head: Normocephalic and atraumatic.     Right Ear: Tympanic membrane and external ear normal.     Left Ear: Tympanic membrane and external ear normal.     Nose: Nose normal.     Mouth/Throat:     Mouth: Mucous membranes are moist.     Pharynx: Oropharynx is clear.  Eyes:     Conjunctiva/sclera: Conjunctivae normal.  Cardiovascular:     Rate and Rhythm: Normal rate and regular  rhythm.     Heart sounds: Normal heart sounds. No murmur heard.    No friction rub. No gallop.  Pulmonary:     Effort: Pulmonary effort is normal.     Breath sounds: Normal breath sounds. No wheezing, rhonchi or rales.  Musculoskeletal:     Cervical back: Neck supple.  Skin:    General: Skin is warm.     Findings: No rash.  Neurological:     Mental Status: She is alert and oriented to person, place, and time.  Psychiatric:        Behavior: Behavior normal.   Previous notes and tests were reviewed. The plan was reviewed with the patient/family, and all questions/concerned were addressed.  It was my pleasure to see Melissa Gaines today and participate in her care. Please feel free to contact me with any questions or concerns.  Sincerely,  Orlan Cramp, DO Allergy  & Immunology  [facility]  [place] office: [phone] [facility] office: [phone]

## 2023-11-04 ENCOUNTER — Encounter (HOSPITAL_COMMUNITY): Payer: Self-pay | Admitting: Internal Medicine

## 2023-11-04 ENCOUNTER — Ambulatory Visit (INDEPENDENT_AMBULATORY_CARE_PROVIDER_SITE_OTHER): Admitting: Allergy

## 2023-11-04 ENCOUNTER — Ambulatory Visit: Payer: 59 | Admitting: Internal Medicine

## 2023-11-04 ENCOUNTER — Encounter: Payer: Self-pay | Admitting: Allergy

## 2023-11-04 VITALS — BP 128/82 | HR 82 | Resp 16

## 2023-11-04 DIAGNOSIS — J4540 Moderate persistent asthma, uncomplicated: Secondary | ICD-10-CM

## 2023-11-04 DIAGNOSIS — T781XXD Other adverse food reactions, not elsewhere classified, subsequent encounter: Secondary | ICD-10-CM

## 2023-11-04 DIAGNOSIS — J3089 Other allergic rhinitis: Secondary | ICD-10-CM

## 2023-11-04 DIAGNOSIS — K219 Gastro-esophageal reflux disease without esophagitis: Secondary | ICD-10-CM | POA: Diagnosis not present

## 2023-11-04 DIAGNOSIS — J309 Allergic rhinitis, unspecified: Secondary | ICD-10-CM

## 2023-11-04 MED ORDER — LEVOCETIRIZINE DIHYDROCHLORIDE 5 MG PO TABS
5.0000 mg | ORAL_TABLET | Freq: Every evening | ORAL | 2 refills | Status: AC
Start: 2023-11-04 — End: ?

## 2023-11-04 MED ORDER — BUDESONIDE-FORMOTEROL FUMARATE 160-4.5 MCG/ACT IN AERO
2.0000 | INHALATION_SPRAY | Freq: Two times a day (BID) | RESPIRATORY_TRACT | 2 refills | Status: AC
Start: 2023-11-04 — End: ?

## 2023-11-04 MED ORDER — FLUTICASONE PROPIONATE 50 MCG/ACT NA SUSP: 1.0000 | Freq: Every day | NASAL | 2 refills | Status: AC | PRN

## 2023-11-04 MED ORDER — AZELASTINE HCL 0.1 % NA SOLN: 1.0000 | Freq: Two times a day (BID) | NASAL | 2 refills | Status: AC | PRN

## 2023-11-04 NOTE — Patient Instructions (Addendum)
 Bring your inhalers/medications at the next visit.  Environmental allergies Continue environmental control measures.  Xyzal  (levocetirizine) daily as needed.  Use azelastine  nasal spray 1-2 sprays per nostril twice a day as needed for runny nose/drainage. Use Flonase  (fluticasone ) nasal spray 1-2 sprays per nostril once a day as needed for nasal congestion.  Nasal saline spray (i.e., Simply Saline) or nasal saline lavage (i.e., NeilMed) is recommended as needed and prior to medicated nasal sprays. Use cromolyn  4% 1 drop in each eye up to four times a day as needed for itchy/watery eyes.  Continue allergy  injections - given today.   Food allergies Continue to avoid seafood. For mild symptoms you can take over the counter antihistamines (zyrtec  10mg  to 20mg ) and monitor symptoms closely.  If symptoms worsen or if you have severe symptoms including breathing issues, throat closure, significant swelling, whole body hives, severe diarrhea and vomiting, lightheadedness then use epinephrine  and seek immediate medical care afterwards. Emergency action plan in place.   Asthma Daily controller medication(s): Symbicort  160mcg 2 puffs twice a day with spacer and rinse mouth afterwards. May use albuterol  rescue inhaler 2 puffs or nebulizer every 4 to 6 hours as needed for shortness of breath, chest tightness, coughing, and wheezing. May use albuterol  rescue inhaler 2 puffs 5 to 15 minutes prior to strenuous physical activities. Monitor frequency of use - if you need to use it more than twice per week on a consistent basis let us  know.  Breathing control goals:  Full participation in all desired activities (may need albuterol  before activity) Albuterol  use two times or less a week on average (not counting use with activity) Cough interfering with sleep two times or less a month Oral steroids no more than once a year No hospitalizations   Reflux Continue lifestyle and dietary modifications. Continue  pantoprazole  40mg  twice a day - nothing to eat or drink for 20-30 minutes afterwards.  Continue famotidine  20mg  twice a day. Follow up with GI.   Return in about 6 months (around 05/05/2024). Or sooner if needed. With Dr. Lorin.

## 2023-11-09 ENCOUNTER — Other Ambulatory Visit: Payer: Self-pay

## 2023-11-09 NOTE — Patient Instructions (Signed)
 Visit Information  Thank you for taking time to visit with me today. Please don't hesitate to contact me if I can be of assistance to you before our next scheduled telephone appointment.  Our next appointment is by telephone on 11/17/23 at 2 pm  Following is a copy of your care plan:   Goals Addressed             This Visit's Progress    [facility]BCI Transitions of Care (TOC) Care Plan       Reviewed on call with patient 11/09/23:   Problems:  Recent Hospitalization for treatment of Small Bowel Obstruction Knowledge Deficit Related to Small Bowel Obstruction SBO was ruled out, patient feels pain is from documented hernia/tortuous esophagus.   Goal:  Over the next 30 days, the patient will not experience hospital readmission  Interventions:  Transitions of Care: Doctor Visits  - discussed the importance of doctor visits Advised patient to call PCP for closer follow up appointment PCP HFU on 9/10: patient needs 3 days notice for appointments due to medicaid transportation 11/10/23 post discharge follow up with surgeon:  Encouraged patient to speak up about ongoing pain level of 8 with ongoing abdominal pain.  Denies blood in stools, reports normal bowel movements.   Patient Self Care Activities:  Call pharmacy for medication refills 3-7 days in advance of running out of medications Call provider office for new concerns or questions  Notify RN Care Manager of TOC call rescheduling needs Participate in Transition of Care Program/Attend TOC scheduled calls Take medications as prescribed   Call your doctor now or seek immediate medical care if: You have a fever. You are vomiting. You have new or worse belly pain. You cannot pass stools or gas. Complete all follow up appointments with enough time to make transportation arrangements.  Declined BSW referral stating was given resources in hospital.   Plan:  Telephone follow up appointment with care management team member scheduled for:   11/17/23 at 2 pm due to PCP HFU on 11/16/23.         Patient verbalizes understanding of instructions and care plan provided today and agrees to view in MyChart. Active MyChart status and patient understanding of how to access instructions and care plan via MyChart confirmed with patient.     Telephone follow up appointment with care management team member scheduled for: 11/17/23 at 2 pm Next PCP appointment scheduled for: 11/16/23  Please call the care guide team at [phone] if you need to cancel or reschedule your appointment.   Please call the Suicide and Crisis Lifeline: 988 call 1-800-273-TALK (toll free, 24 hour hotline) call 911 if you are experiencing a Mental Health or Behavioral Health Crisis or need someone to talk to.   Bing Edison MSN, RN RN Case Sales executive Health  [facility]-Population Health Office Hours M-F [phone] Direct Dial: [phone] Main Phone [phone]  Fax: [phone] [email or web address]

## 2023-11-09 NOTE — Transitions of Care (Post Inpatient/ED Visit) (Signed)
 Transition of Care week 2  Visit Note  11/09/2023  Name: Melissa Gaines MRN: [id number]          DOB: 06-Sep-1970  Situation: Patient enrolled in [facility] 30-day program. Visit completed with patient by telephone.   Background:   Initial Transition Care Management Follow-up Telephone Call    Past Medical History:  Diagnosis Date   Anemia    Anxiety    Arthritis    Asthma    Cancer (HCC)    skin cancer   Chronic pain    Complication of anesthesia    Asthma attack during colonoscopy and EGD   Congenital hip dislocation    Depression    GERD (gastroesophageal reflux disease)    Iron deficiency anemia 04/14/2018   Kidney mass 2017   Migraine    Osteoporosis    Pathological dislocation of shoulder joint, bilateral    congential   Pneumonia    as a child   PTSD (post-traumatic stress disorder)    Sleep apnea    Hx. of no CPAP   Systemic lupus erythematosus (HCC)    Tuberculosis    as a child was treated   Urticaria     Assessment: Patient Reported Symptoms: Cognitive Cognitive Status: No symptoms reported, Alert and oriented to person, place, and time, Insightful and able to interpret abstract concepts, Normal speech and language skills      Neurological Neurological Review of Symptoms: No symptoms reported    HEENT HEENT Symptoms Reported: Change or loss of hearing HEENT Management Strategies: Routine screening HEENT Self-Management Outcome: 4 (good)    Cardiovascular Cardiovascular Symptoms Reported: No symptoms reported    Respiratory Respiratory Symptoms Reported: No symptoms reported    Endocrine Endocrine Symptoms Reported: No symptoms reported    Gastrointestinal Gastrointestinal Symptoms Reported: Abdominal pain or discomfort Additional Gastrointestinal Details: Denies blood in stools and normal bowel movements on this call. Gastrointestinal Management Strategies: Coping strategies, Adequate rest Gastrointestinal Self-Management Outcome: 2  (bad) Gastrointestinal Comment: Feels like pain is from hernia. SBO ruled out in recent hospitalization.    Genitourinary Genitourinary Symptoms Reported: Incontinence Genitourinary Management Strategies: Incontinence garment/pad  Integumentary Integumentary Symptoms Reported: No symptoms reported    Musculoskeletal Musculoskelatal Symptoms Reviewed: Weakness Additional Musculoskeletal Details: Patient reports dislocated hips with metal plates per patient Musculoskeletal Management Strategies: Routine screening Musculoskeletal Self-Management Outcome: 3 (uncertain) Falls in the past year?: Yes Patient at Risk for Falls Due to: History of fall(s), Impaired mobility  Psychosocial Psychosocial Symptoms Reported: No symptoms reported         There were no vitals filed for this visit. Pain level 8 in abdomen, see vitals note.   Medications Reviewed Today     Reviewed by Carolee Heron NOVAK, RN (Case Manager) on 11/09/23 at 1427  Med List Status: <None>   Medication Order Taking? Sig Documenting Provider Last Dose Status Informant  albuterol  (VENTOLIN  HFA) 108 (90 Base) MCG/ACT inhaler [id number] Yes Inhale 2 puffs into the lungs every 6 (six) hours as needed for wheezing or shortness of breath. Donzetta Rollene BRAVO, MD  Active Self, Pharmacy Records  atorvastatin  (LIPITOR) 80 MG tablet [id number] Yes Take 1 tablet (80 mg total) by mouth daily. Alba Sharper, MD  Active   azelastine  (ASTELIN ) 0.1 % nasal spray [id number] Yes Place 1-2 sprays into both nostrils 2 (two) times daily as needed (nasal drainage). Use in each nostril as directed Luke Orlan HERO, DO  Active   budesonide -formoterol  (SYMBICORT ) 160-4.5 MCG/ACT inhaler [id number] Yes Inhale 2  puffs into the lungs in the morning and at bedtime. with spacer and rinse mouth afterwards. Luke Orlan HERO, DO  Active   diclofenac  Sodium (VOLTAREN  ARTHRITIS PAIN) 1 % GEL [id number] Yes Apply 4 g topically 4 (four) times daily. Christia Budds, MD  Active Self,  Pharmacy Records  ELIQUIS  5 MG TABS tablet [id number] Yes TAKE 1 TABLET BY MOUTH TWICE  DAILY NEEDS PCP APPOINTMENT  BEFORE MORE FILLS Christia Budds, MD  Active Self, Pharmacy Records           Med Note (COFFELL, JON HERO   Mon Oct 31, 2023 10:01 AM)    EPINEPHrine  0.3 mg/0.3 mL IJ SOAJ injection [id number] Yes INJECT INTRAMUSCULARLY 1 PEN AS  NEEDED FOR ALLERGIC RESPONSE AS  DIRECTED BY MD. [facility] MEDICAL  HELP AFTER USE. Cheryl Reusing, FNP  Active Self, Pharmacy Records           Med Note (SATTERFIELD, TEENA FORBES Repress Oct 30, 2023  2:27 PM)    Erenumab -aooe (AIMOVIG ) 140 MG/ML EMMANUEL [id number] Yes Inject 140 mg into the skin every 30 (thirty) days. Skeet Juliene SAUNDERS, DO  Active Self, Pharmacy Records           Med Note (COFFELL, JON HERO   Mon Oct 31, 2023 10:13 AM) Last injection > 30 days, patient states she had several vaccines this month so she skipped this injection.  ezetimibe  (ZETIA ) 10 MG tablet [id number] Yes Take 1 tablet (10 mg total) by mouth daily. Alba Sharper, MD  Active Self, Pharmacy Records  famotidine  (PEPCID ) 20 MG tablet [id number] Yes TAKE 1 TABLET BY MOUTH TWICE  DAILY  Patient taking differently: Take 20 mg by mouth daily.   Lorin Norris, MD  Active Self, Pharmacy Records  ferrous sulfate  325 [phone] FE) MG tablet [id number] Yes Take 1 tablet (325 mg total) by mouth 3 (three) times daily with meals.  Patient taking differently: Take 325 mg by mouth daily.   Burton, Lacie K, NP  Active Self, Pharmacy Records           Med Note (COFFELL, JON HERO   Mon Oct 31, 2023 10:10 AM)    fluticasone  (FLONASE ) 50 MCG/ACT nasal spray [id number] Yes Place 1-2 sprays into both nostrils daily as needed (nasal congestion). Luke Orlan HERO, DO  Active   levocetirizine (XYZAL ) 5 MG tablet [id number] Yes Take 1 tablet (5 mg total) by mouth every evening. Luke Orlan HERO, DO  Active   traZODone  (DESYREL ) 100 MG tablet [id number] Yes Take 1 tablet (100 mg total) by mouth at bedtime. Curry Leni DASEN, MD   Active Self, Pharmacy Records           Med Note (SATTERFIELD, TEENA FORBES Repress Oct 30, 2023  2:38 PM) Patient verified she is taking every night   VITAMIN D  PO [id number] Yes Take 1 capsule by mouth every Thursday. [provider]  Active Self, Pharmacy Records           Med Note (COFFELL, JON HERO   Mon Oct 31, 2023 10:14 AM) Patient states she takes an OTC vitamin D  taken once a week - unable to confirm if D2 or D3, unable to confirm strength.             Recommendation:   PCP hospital follow up on 11/16/23.  Specialty provider follow-up 11/10/23 to discuss on going pain/abdomen Continue Current Plan of Care  Follow Up Plan:   Telephone follow-up in 1 week 11/17/23 at  2 pm   Bing Edison MSN, RN RN Case Manager [facility]  [facility]-Population Health Office Hours M-F [phone] Direct Dial: [phone] Main Phone [phone]  Fax: [phone] [email or web address]

## 2023-11-10 DIAGNOSIS — Z9884 Bariatric surgery status: Secondary | ICD-10-CM | POA: Diagnosis not present

## 2023-11-10 DIAGNOSIS — K449 Diaphragmatic hernia without obstruction or gangrene: Secondary | ICD-10-CM | POA: Diagnosis not present

## 2023-11-10 DIAGNOSIS — K219 Gastro-esophageal reflux disease without esophagitis: Secondary | ICD-10-CM | POA: Diagnosis not present

## 2023-11-11 ENCOUNTER — Ambulatory Visit
Admission: RE | Admit: 2023-11-11 | Discharge: 2023-11-11 | Disposition: A | Discharge status: Home / Self Care | Source: Ambulatory Visit | Attending: Internal Medicine | Admitting: Internal Medicine

## 2023-11-11 DIAGNOSIS — D352 Benign neoplasm of pituitary gland: Secondary | ICD-10-CM

## 2023-11-11 DIAGNOSIS — G319 Degenerative disease of nervous system, unspecified: Secondary | ICD-10-CM | POA: Diagnosis not present

## 2023-11-11 MED ORDER — GADOPICLENOL 0.5 MMOL/ML IV SOLN
9.0000 mL | Freq: Once | INTRAVENOUS | Status: AC | PRN
  Administered 2023-11-11: 9 mL via INTRAVENOUS

## 2023-11-14 ENCOUNTER — Telehealth: Payer: Self-pay

## 2023-11-14 ENCOUNTER — Emergency Department (HOSPITAL_COMMUNITY)
Admission: EM | Admit: 2023-11-14 | Discharge: 2023-11-14 | Disposition: A | Discharge status: Home / Self Care | Attending: Emergency Medicine | Admitting: Emergency Medicine

## 2023-11-14 ENCOUNTER — Other Ambulatory Visit (HOSPITAL_COMMUNITY): Payer: Self-pay

## 2023-11-14 ENCOUNTER — Other Ambulatory Visit: Payer: Self-pay

## 2023-11-14 ENCOUNTER — Encounter (HOSPITAL_COMMUNITY): Payer: Self-pay

## 2023-11-14 ENCOUNTER — Encounter (HOSPITAL_COMMUNITY): Payer: Self-pay | Admitting: Internal Medicine

## 2023-11-14 ENCOUNTER — Other Ambulatory Visit

## 2023-11-14 DIAGNOSIS — R519 Headache, unspecified: Secondary | ICD-10-CM | POA: Diagnosis not present

## 2023-11-14 DIAGNOSIS — D352 Benign neoplasm of pituitary gland: Secondary | ICD-10-CM | POA: Diagnosis not present

## 2023-11-14 DIAGNOSIS — F419 Anxiety disorder, unspecified: Secondary | ICD-10-CM | POA: Diagnosis not present

## 2023-11-14 DIAGNOSIS — K219 Gastro-esophageal reflux disease without esophagitis: Secondary | ICD-10-CM | POA: Diagnosis not present

## 2023-11-14 DIAGNOSIS — R5383 Other fatigue: Secondary | ICD-10-CM | POA: Insufficient documentation

## 2023-11-14 DIAGNOSIS — J45909 Unspecified asthma, uncomplicated: Secondary | ICD-10-CM | POA: Diagnosis not present

## 2023-11-14 DIAGNOSIS — M329 Systemic lupus erythematosus, unspecified: Secondary | ICD-10-CM | POA: Insufficient documentation

## 2023-11-14 DIAGNOSIS — G4730 Sleep apnea, unspecified: Secondary | ICD-10-CM | POA: Insufficient documentation

## 2023-11-14 DIAGNOSIS — R42 Dizziness and giddiness: Secondary | ICD-10-CM | POA: Insufficient documentation

## 2023-11-14 DIAGNOSIS — G8929 Other chronic pain: Secondary | ICD-10-CM | POA: Insufficient documentation

## 2023-11-14 DIAGNOSIS — F32A Depression, unspecified: Secondary | ICD-10-CM | POA: Diagnosis not present

## 2023-11-14 LAB — COMPREHENSIVE METABOLIC PANEL WITH GFR
ALT: 12 U/L (ref 0–44)
AST: 15 U/L (ref 15–41)
Albumin: 3.7 g/dL (ref 3.5–5.0)
Alkaline Phosphatase: 54 U/L (ref 38–126)
Anion gap: 13 (ref 5–15)
BUN: <5 mg/dL — ABNORMAL LOW (ref 6–20)
CO2: 24 mmol/L (ref 22–32)
Calcium: 8.9 mg/dL (ref 8.9–10.3)
Chloride: 101 mmol/L (ref 98–111)
Creatinine, Ser: 0.51 mg/dL (ref 0.44–1.00)
GFR, Estimated: >60 mL/min (ref >60)
Glucose, Bld: 84 mg/dL (ref 70–99)
Potassium: 3.3 mmol/L — ABNORMAL LOW (ref 3.5–5.1)
Sodium: 138 mmol/L (ref 135–145)
Total Bilirubin: 0.6 mg/dL (ref 0.0–1.2)
Total Protein: 7.4 g/dL (ref 6.5–8.1)

## 2023-11-14 LAB — URINALYSIS, ROUTINE W REFLEX MICROSCOPIC
Bilirubin Urine: NEGATIVE
Glucose, UA: NEGATIVE mg/dL
Hgb urine dipstick: NEGATIVE
Ketones, ur: NEGATIVE mg/dL
Leukocytes,Ua: NEGATIVE
Nitrite: NEGATIVE
Protein, ur: NEGATIVE mg/dL
Specific Gravity, Urine: 1.014 (ref 1.005–1.030)
pH: 6 (ref 5.0–8.0)

## 2023-11-14 LAB — RESP PANEL BY RT-PCR (RSV, FLU A&B, COVID)  RVPGX2
Influenza A by PCR: NEGATIVE
Influenza B by PCR: NEGATIVE
Resp Syncytial Virus by PCR: NEGATIVE
SARS Coronavirus 2 by RT PCR: NEGATIVE

## 2023-11-14 LAB — CBC
HCT: 45.3 % (ref 36.0–46.0)
Hemoglobin: 14.7 g/dL (ref 12.0–15.0)
MCH: 28.5 pg (ref 26.0–34.0)
MCHC: 32.5 g/dL (ref 30.0–36.0)
MCV: 87.8 fL (ref 80.0–100.0)
Platelets: 334 K/uL (ref 150–400)
RBC: 5.16 MIL/uL — ABNORMAL HIGH (ref 3.87–5.11)
RDW: 13.7 % (ref 11.5–15.5)
WBC: 7.1 K/uL (ref 4.0–10.5)
nRBC: 0 % (ref 0.0–0.2)

## 2023-11-14 LAB — CK: Total CK: 56 U/L (ref 38–234)

## 2023-11-14 MED ORDER — MECLIZINE HCL 25 MG PO TABS
25.0000 mg | ORAL_TABLET | Freq: Three times a day (TID) | ORAL | 0 refills | Status: DC | PRN
  Filled 2023-11-14: qty 30, 10d supply, fill #0

## 2023-11-14 MED ORDER — DIPHENHYDRAMINE HCL 25 MG PO CAPS
25.0000 mg | ORAL_CAPSULE | Freq: Once | ORAL | Status: AC
  Administered 2023-11-14: 25 mg via ORAL
  Filled 2023-11-14: qty 1

## 2023-11-14 MED ORDER — LACTATED RINGERS IV BOLUS
1000.0000 mL | Freq: Once | INTRAVENOUS | Status: AC
  Administered 2023-11-14: 1000 mL via INTRAVENOUS

## 2023-11-14 MED ORDER — METOCLOPRAMIDE HCL 5 MG/ML IJ SOLN
10.0000 mg | Freq: Once | INTRAMUSCULAR | Status: AC
  Administered 2023-11-14: 10 mg via INTRAVENOUS
  Filled 2023-11-14: qty 2

## 2023-11-14 NOTE — Discharge Instructions (Addendum)
 Please follow-up with your primary care doctor.  Return to the emergency room for any new or concerning symptoms. I have given you the information for the [facility] health wellness clinic which is a primary care office that you may follow-up in.

## 2023-11-14 NOTE — ED Provider Notes (Signed)
 [place] EMERGENCY DEPARTMENT AT [facility] Provider Note   CSN: [id number] Arrival date & time: 11/14/23  9167     Patient presents with: Dizziness and Headache   Melissa Gaines is a 53 y.o. female.  {Add pertinent medical, surgical, social history, OB history to HPI:32947}  Dizziness Associated symptoms: headaches   Headache Associated symptoms: dizziness    Patient is a 53 year old female with a past medical history significant for SLE, asthma, chronic pain, sleep apnea, anxiety, migraine, reflux, depression  She presents emergency room today with complaints of generalized fatigue, headaches, chills, intermittent vertigo  Patient states that she has had the symptoms since approximately 6 days ago.  Denies any fever but does endorse chills.  Denies any hemoptysis no missed doses of her medications.  She did recently start taking Nexium and Carafate for reflux and she is concerned that this is related to her symptoms.  She denies any chest pain or difficulty breathing.  No syncopal episodes, no black or tarry stool.     Prior to Admission medications   Medication Sig Start Date End Date Taking? Authorizing Provider  albuterol  (VENTOLIN  HFA) 108 (90 Base) MCG/ACT inhaler Inhale 2 puffs into the lungs every 6 (six) hours as needed for wheezing or shortness of breath. 11/18/21   Donzetta Rollene BRAVO, MD  atorvastatin  (LIPITOR) 80 MG tablet Take 1 tablet (80 mg total) by mouth daily. 11/01/23   Alba Sharper, MD  azelastine  (ASTELIN ) 0.1 % nasal spray Place 1-2 sprays into both nostrils 2 (two) times daily as needed (nasal drainage). Use in each nostril as directed 11/04/23   Luke Orlan HERO, DO  budesonide -formoterol  (SYMBICORT ) 160-4.5 MCG/ACT inhaler Inhale 2 puffs into the lungs in the morning and at bedtime. with spacer and rinse mouth afterwards. 11/04/23   Luke Orlan HERO, DO  diclofenac  Sodium (VOLTAREN  ARTHRITIS PAIN) 1 % GEL Apply 4 g topically 4 (four)  times daily. 02/25/23   Christia Budds, MD  ELIQUIS  5 MG TABS tablet TAKE 1 TABLET BY MOUTH TWICE  DAILY NEEDS PCP APPOINTMENT  BEFORE MORE FILLS 03/14/23   Christia Budds, MD  EPINEPHrine  0.3 mg/0.3 mL IJ SOAJ injection INJECT INTRAMUSCULARLY 1 PEN AS  NEEDED FOR ALLERGIC RESPONSE AS  DIRECTED BY MD. [facility]  HELP AFTER USE. 08/30/22   Cheryl Reusing, FNP  Erenumab -aooe (AIMOVIG ) 140 MG/ML SOAJ Inject 140 mg into the skin every 30 (thirty) days. 05/17/23   Skeet Juliene SAUNDERS, DO  ezetimibe  (ZETIA ) 10 MG tablet Take 1 tablet (10 mg total) by mouth daily. 09/15/23 12/14/23  Alba Sharper, MD  famotidine  (PEPCID ) 20 MG tablet TAKE 1 TABLET BY MOUTH TWICE  DAILY Patient taking differently: Take 20 mg by mouth daily. 03/21/23   Lorin Norris, MD  ferrous sulfate  325 (65 FE) MG tablet Take 1 tablet (325 mg total) by mouth 3 (three) times daily with meals. Patient taking differently: Take 325 mg by mouth daily. 12/03/20   Burton, Lacie K, NP  fluticasone  (FLONASE ) 50 MCG/ACT nasal spray Place 1-2 sprays into both nostrils daily as needed (nasal congestion). 11/04/23   Luke Orlan HERO, DO  levocetirizine (XYZAL ) 5 MG tablet Take 1 tablet (5 mg total) by mouth every evening. 11/04/23   Luke Orlan HERO, DO  traZODone  (DESYREL ) 100 MG tablet Take 1 tablet (100 mg total) by mouth at bedtime. 09/22/23   Curry Leni DASEN, MD  VITAMIN D  PO Take 1 capsule by mouth every Thursday.    [provider]  Allergies: Fish allergy     Review of Systems  Neurological:  Positive for dizziness and headaches.    Updated Vital Signs BP 129/64 (BP Location: Right Arm)   Pulse 67   Temp 98 F (36.7 C) (Oral)   Resp 15   Ht 4' 10 (1.473 m)   Wt 83.9 kg   SpO2 100%   BMI 38.67 kg/m   Physical Exam Vitals and nursing note reviewed.  Constitutional:      General: She is not in acute distress.    Appearance: She is not ill-appearing.  HENT:     Head: Normocephalic and atraumatic.     Nose: Nose normal.  Eyes:      General: No scleral icterus. Cardiovascular:     Rate and Rhythm: Normal rate and regular rhythm.     Pulses: Normal pulses.     Heart sounds: Normal heart sounds.  Pulmonary:     Effort: Pulmonary effort is normal. No respiratory distress.     Breath sounds: No wheezing.  Abdominal:     Palpations: Abdomen is soft.     Tenderness: There is no abdominal tenderness.  Musculoskeletal:     Cervical back: Normal range of motion.     Right lower leg: No edema.     Left lower leg: No edema.  Skin:    General: Skin is warm and dry.     Capillary Refill: Capillary refill takes less than 2 seconds.  Neurological:     Mental Status: She is alert. Mental status is at baseline.  Psychiatric:        Behavior: Behavior normal.     Comments: Anxious     (all labs ordered are listed, but only abnormal results are displayed) Labs Reviewed  CBC  COMPREHENSIVE METABOLIC PANEL WITH GFR  URINALYSIS, ROUTINE W REFLEX MICROSCOPIC  CK    EKG: None  Radiology: No results found.  {Document cardiac monitor, telemetry assessment procedure when appropriate:32947} Procedures   Medications Ordered in the ED  lactated ringers  bolus 1,000 mL (1,000 mLs Intravenous New Bag/Given 11/14/23 0942)  metoCLOPramide  (REGLAN ) injection 10 mg (10 mg Intravenous Given 11/14/23 0942)  diphenhydrAMINE  (BENADRYL ) capsule 25 mg (25 mg Oral Given 11/14/23 0943)      {Click here for ABCD2, HEART and other calculators REFRESH Note before signing:1}                              Medical Decision Making Amount and/or Complexity of Data Reviewed Labs: ordered.  Risk Prescription drug management.   This patient presents to the ED for concern of headache, this involves a number of treatment options, and is a complaint that carries with it a moderate to severe risk of complications and morbidity. A differential diagnosis was considered for the patient's symptoms which is discussed below:   Emergent considerations  for headache include subarachnoid hemorrhage, meningitis, temporal arteritis, glaucoma, cerebral ischemia, carotid/vertebral dissection, intracranial tumor, Venous sinus thrombosis, carbon monoxide poisoning, acute or chronic subdural hemorrhage.  Other considerations include: Migraine, Cluster headache, Hypertension, Caffeine, alcohol, or drug withdrawal, Pseudotumor cerebri, Arteriovenous malformation, Head injury, Neurocysticercosis, Post-lumbar puncture, Preeclampsia, Tension headache, Sinusitis, Cervical arthritis, Refractive error causing strain, Dental abscess, Otitis media, Temporomandibular joint syndrome, Depression, Somatoform disorder (eg, somatization) Trigeminal neuralgia, Glossopharyngeal neuralgia.    Co morbidities: Discussed in HPI   Brief History:  Patient is a 53 year old female with a past medical history significant for SLE, asthma, chronic pain,  sleep apnea, anxiety, migraine, reflux, depression  She presents emergency room today with complaints of generalized fatigue, headaches, chills, intermittent vertigo  Patient states that she has had the symptoms since approximately 6 days ago.  Denies any fever but does endorse chills.  Denies any hemoptysis no missed doses of her medications.  She did recently start taking Nexium and Carafate for reflux and she is concerned that this is related to her symptoms.  She denies any chest pain or difficulty breathing.  No syncopal episodes, no black or tarry stool.    EMR reviewed including pt PMHx, past surgical history and past visits to ER.   See HPI for more details   Lab Tests:   {Blank single:19197::I ordered and independently interpreted labs. Labs notable for,I personally reviewed all laboratory work and imaging. Metabolic panel without any acute abnormality specifically kidney function within normal limits and no significant electrolyte abnormalities. CBC without leukocytosis or significant anemia.}   Imaging  Studies:  {Blank single:19197::NAD. I personally reviewed all imaging studies and no acute abnormality found. I agree with radiology interpretation.,Abnormal findings. I personally reviewed all imaging studies. Imaging notable for,No imaging studies ordered for this patient}    Cardiac Monitoring:  {Blank single:19197::The patient was maintained on a cardiac monitor.  I personally viewed and interpreted the cardiac monitored which showed an underlying rhythm of:,NA} {Blank single:19197::EKG non-ischemic,NA}   Medicines ordered:  I ordered medication including ***  for *** Reevaluation of the patient after these medicines showed that the patient {resolved/improved/worsened:23923::improved} I have reviewed the patients home medicines and have made adjustments as needed   Critical Interventions:  ***   Consults/Attending Physician   {Blank single:19197::I requested consultation with ***,  and discussed lab and imaging findings as well as pertinent plan - they recommend: ***,I discussed this case with my attending physician who cosigned this note including patient's presenting symptoms, physical exam, and planned diagnostics and interventions. Attending physician stated agreement with plan or made changes to plan which were implemented.}   Reevaluation:  After the interventions noted above I re-evaluated patient and found that they have :{resolved/improved/worsened:23923::improved}   Social Determinants of Health:  {Blank single:19197::Given cab voucher,Social work/case management involved,The patient's social determinants of health were a factor in the care of this patient}    Problem List / ED Course:  ***   Dispostion:  After consideration of the diagnostic results and the patients response to treatment, I feel that the patent would benefit from ***       Final diagnoses:  None    ED Discharge Orders     None

## 2023-11-14 NOTE — Telephone Encounter (Signed)
 Patient LVM on nurse line requesting a call back.   She reports she has a migraine and reports feeling unwell. She is requesting an apt.   It appears the patient present to the ED for further evaluation.

## 2023-11-14 NOTE — ED Notes (Signed)
 Pt ambulated to restroom without assistance.

## 2023-11-14 NOTE — ED Triage Notes (Signed)
 C/o dizziness and generalized not feeling well since Wed.

## 2023-11-14 NOTE — ED Triage Notes (Signed)
 Pt c.o dizziness, headache and chills over the weekend, states she was recently started on a new medication for GERD (nexium and carafate) and thinks it could be related.

## 2023-11-16 ENCOUNTER — Other Ambulatory Visit: Payer: Self-pay | Admitting: Family Medicine

## 2023-11-16 ENCOUNTER — Other Ambulatory Visit (HOSPITAL_COMMUNITY): Payer: Self-pay

## 2023-11-16 ENCOUNTER — Encounter: Payer: Self-pay | Admitting: Family Medicine

## 2023-11-16 ENCOUNTER — Ambulatory Visit: Admitting: Family Medicine

## 2023-11-16 VITALS — BP 110/70 | HR 74 | Ht <= 58 in | Wt 185.0 lb

## 2023-11-16 DIAGNOSIS — K219 Gastro-esophageal reflux disease without esophagitis: Secondary | ICD-10-CM | POA: Diagnosis not present

## 2023-11-16 DIAGNOSIS — K56609 Unspecified intestinal obstruction, unspecified as to partial versus complete obstruction: Secondary | ICD-10-CM | POA: Diagnosis not present

## 2023-11-16 DIAGNOSIS — R42 Dizziness and giddiness: Secondary | ICD-10-CM | POA: Diagnosis not present

## 2023-11-16 DIAGNOSIS — D6861 Antiphospholipid syndrome: Secondary | ICD-10-CM | POA: Diagnosis not present

## 2023-11-16 DIAGNOSIS — K449 Diaphragmatic hernia without obstruction or gangrene: Secondary | ICD-10-CM

## 2023-11-16 MED ORDER — MECLIZINE HCL 25 MG PO TABS
25.0000 mg | ORAL_TABLET | Freq: Three times a day (TID) | ORAL | 0 refills | Status: AC | PRN
Start: 2023-11-16 — End: ?

## 2023-11-16 NOTE — Assessment & Plan Note (Addendum)
 Compliant with Eliquis , follow-up with Heme/Onc.

## 2023-11-16 NOTE — Patient Instructions (Addendum)
 It was great to see you! Thank you for allowing me to participate in your care!  Our plans for today:  - Please make sure to follow-up with your stomach doctor and surgeons in the end of October and November. - If you have any new or worsening abdominal pain or sudden nausea vomiting, please immediately seek medical attention as this could be a sign of another bowel obstruction. - I have resent your meclizine  to your [facility] pharmacy. - I have placed a referral for vestibular therapy for your dizziness, they should call you to schedule your appointment. - I will see you again in 3 months.   Please arrive 15 minutes PRIOR to your next scheduled appointment time! If you do not, this affects OTHER patients' care.  Take care and seek immediate care sooner if you develop any concerns.   Melissa Provencal, MD, PGY-3 [facility] 11:25 AM 11/16/2023  [facility]

## 2023-11-16 NOTE — Assessment & Plan Note (Signed)
 Pain controlled, but remains significant. Benign abdominal exam, no SBO symptoms since discharge.  Continue esomeprazole and Carafate.  Continue follow-up with GI and general surgery in late October/November.

## 2023-11-16 NOTE — Progress Notes (Unsigned)
 NEUROLOGY FOLLOW UP OFFICE NOTE  Melissa Gaines [id number]  Assessment/Plan:   Migraine without aura, without status migrainosus, not intractable Vestibular migraine - MRI of cervical spine unremarkable.  I think the current dizzy spell is because she didn't treat it with an abortive medication (Ubrelvy ) Left sided occipital neuralgia Pituitary microadenoma  Migraine prevention:  Aimovig  140mg   Migraine rescue:  Restart Ubrelvy  100mg  (unlikely cause of her nausea) and will prescribe Zofran -ODT 4mg .  May use meclizine  but sparingly.  To break current vestibular migraine spell, I offered prednisone  taper, although that may upset her stomach.  She declines at this time.  Thankfully, the dizziness has been dissipating.   Limit use of pain relievers to no more than 9 days out of the month to prevent risk of rebound or medication-overuse headache. Keep headache diary Follow up 6 months.   Subjective:  Melissa Gaines is a 53 year old woman with PTSD, anxiety, chronic dizziness, migraines, SLE and antiphospholipid antibody syndrome with history of PE on chronic anticoagulation who follows up for migraines.  MRI of cervical spine and brain personally reviewed.   UPDATE: Due to ongoing neck pain despite therapy, she had MRI of cervical spine on 05/30/2023 which revealed small right paracentral disc protrusions at C4-5 and C6-7 and additional mild noncompressive disc bulging at C3-4 and C5-6 without stenosis or impingement.  Overall, migraines have remained relatively controlled. Intensity:  usually mild, sometimes moderate Duration:  few hours untreated.  she has been experiencing more nausea.  She has a hernia and GERD.  Her surgeon took her off of Ubrelvy  due to the nausea.  She hasn't been taking anything for migraines. Frequency:  2-3 days a month.  Dizziness occurs intermittent, usually 2 a month but not with the headache.  She did have a headache with vertigo on  11/14/2023 for which she was seen in the ED.  She still has it.    Current NSAIDS:  Tylenol , naproxen  (most days a week) Current analgesics:  None Current triptans:  none Current ergotamine:  none Current anti-emetic:  none Current muscle relaxants:  none Current sleep aide:  none Current Antihypertensive medications:  none Current Antidepressant medications:  trazodone  100mg  at bedtime Current Anticonvulsant medications: none Current anti-CGRP:  Aimovig  140mg  Current Vitamins/Herbal/Supplements:  D, folic acid , B12 Current Antihistamines/Decongestants:  meclizine  25mg  PRN, Xyzal , Flonase , Astelin  Other therapy:  Icy-cold  Hormone/birth control:  none Other medications:  Eliquis    Caffeine:  No coffee.  Pepsi daily. Alcohol:  no Smoker:  no Diet:  4 glasses of water daily.  Skips meals Exercise:  no Depression:  yes; Anxiety:  yes Other pain:  Joint pain, back pain, lumbar radicular pain Sleep hygiene:  varies   HISTORY:  Onset:  Mild migraines in 1990s but worse since birth of son in 2010 Location:  Left temple radiating down to back of head Quality:  pounding, shocks Initial Intensity:  Severe.  She denies new headache, thunderclap headache Aura:  no Premonitory Phase:  no Postdrome:  no Associated symptoms:  Photophobia, phonophobia, dizziness, blurred vision, stuttering speech. If severe, she has memory deficits such as difficulty spelling her name or remembering her social security number.  She denies associated nausea, vomiting,  unilateral numbness or weakness. Initial Duration:  Varies.  1 hour to several hours.  Last month, she had one that lasted 5 days. Initial Frequency:  Daily Initial Frequency of abortive medication: ASA and Tylenol  daily Triggers:  bright sunlight Relieving factors:  Resting in  dark quiet room, cold packs Activity:  aggravates   She reports increased electric shock pain in the left temple radiating to back of head, lasting 1 to hours with  photophobia and phonophobia, occurring twice a month.  On 10/25/2022, she may have picked up something heavy but the next day developed left suboccipital/upper cervical region radiating to mid cervical/suboccipital region, sometimes radiating to right side.  If she turns her head from left side to right side, pain is aggravated.  Looking down makes her dizzy.  No pain down the arm but sometimes numbness in the fingers, however she sometimes has numbness in fingers mostly on the right while sleeping.  No weakness.  Treatments include PT, home stretches and prednisone  taper.     Past NSAIDS:  ibuprofen , ASA 325mg  Past analgesics:  Extra-strength Tylenol  Past abortive triptans:  rizatriptan , sumatriptan  tab Past abortive ergotamine:  none Past muscle relaxants:  none Past anti-emetic:  none Past antihypertensive medications:  metoprolol  Past antidepressant medications:  Paxil  Past anticonvulsant medications:  topiramate  100mg , gabapentin  Past anti-CGRP:  Ubrelvy  100mg  (discontinued due to potential cause for nausea) Past vitamins/Herbal/Supplements:  none Past antihistamines/decongestants:  none Other past therapies:  none   Family history of headache:  Mom   Of note, she was told she had a small tumor 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  However, she followed up with another neurologist in [place]  who performed a repeat brain MRI and was told that it was gone.  MRI of brain with and without contrast on 09/12/2020 showed subtle 4-5 mm nodularity at base of pituitary infundibulum possibly reflecting current or previous hypophysitis.  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  Otherwise, brain and pituitary gland unremarkable.  Pituitary microadenoma followed by endocrinology.  Repeat MRI of brain with and without contrast on 11/19/2021 showed stable small nodular enhancement of the pituitary infundibulum just below the hypothalamus.  Labs normal.  PAST  MEDICAL HISTORY: Past Medical History:  Diagnosis Date   Anemia    Anxiety    Arthritis    Asthma    Cancer (HCC)    skin cancer   Chronic pain    Complication of anesthesia    Asthma attack during colonoscopy and EGD   Congenital hip dislocation    Depression    GERD (gastroesophageal reflux disease)    Hx pulmonary embolism 07/08/2017   Iron deficiency anemia 04/14/2018   Kidney mass 2017   Migraine    Osteoporosis    Pathological dislocation of shoulder joint, bilateral    congential   Pneumonia    as a child   PTSD (post-traumatic stress disorder)    Sleep apnea    Hx. of no CPAP   Systemic lupus erythematosus (HCC)    Tuberculosis    as a child was treated   Urticaria     MEDICATIONS: Current Outpatient Medications on File Prior to Visit  Medication Sig Dispense Refill   albuterol  (VENTOLIN  HFA) 108 (90 Base) MCG/ACT inhaler Inhale 2 puffs into the lungs every 6 (six) hours as needed for wheezing or shortness of breath. 8 g 2   atorvastatin  (LIPITOR) 80 MG tablet Take 1 tablet (80 mg total) by mouth daily. 90 tablet 3   azelastine  (ASTELIN ) 0.1 % nasal spray Place 1-2 sprays into both nostrils 2 (two) times daily as needed (nasal drainage). Use in each nostril as directed 90 mL 2   budesonide -formoterol  (SYMBICORT ) 160-4.5 MCG/ACT inhaler Inhale 2 puffs  into the lungs in the morning and at bedtime. with spacer and rinse mouth afterwards. 3 each 2   diclofenac  Sodium (VOLTAREN  ARTHRITIS PAIN) 1 % GEL Apply 4 g topically 4 (four) times daily. 350 g 0   ELIQUIS  5 MG TABS tablet TAKE 1 TABLET BY MOUTH TWICE  DAILY NEEDS PCP APPOINTMENT  BEFORE MORE FILLS 60 tablet 11   EPINEPHrine  0.3 mg/0.3 mL IJ SOAJ injection INJECT INTRAMUSCULARLY 1 PEN AS  NEEDED FOR ALLERGIC RESPONSE AS  DIRECTED BY MD. SEEK MEDICAL  HELP AFTER USE. 4 each 1   Erenumab -aooe (AIMOVIG ) 140 MG/ML SOAJ Inject 140 mg into the skin every 30 (thirty) days. 1.12 mL 11   esomeprazole (NEXIUM) 40 MG capsule  Take 40 mg by mouth.     ezetimibe  (ZETIA ) 10 MG tablet TAKE 1 TABLET BY MOUTH DAILY 100 tablet 2   famotidine  (PEPCID ) 20 MG tablet TAKE 1 TABLET BY MOUTH TWICE  DAILY (Patient taking differently: Take 20 mg by mouth daily.) 180 tablet 3   ferrous sulfate  325 (65 FE) MG tablet Take 1 tablet (325 mg total) by mouth 3 (three) times daily with meals. (Patient taking differently: Take 325 mg by mouth daily.) 90 tablet 1   fluticasone  (FLONASE ) 50 MCG/ACT nasal spray Place 1-2 sprays into both nostrils daily as needed (nasal congestion). 48 g 2   levocetirizine (XYZAL ) 5 MG tablet Take 1 tablet (5 mg total) by mouth every evening. 90 tablet 2   meclizine  (ANTIVERT ) 25 MG tablet Take 1 tablet (25 mg total) by mouth 3 (three) times daily as needed for dizziness. 30 tablet 0   sucralfate (CARAFATE) 1 g tablet Take 1 g by mouth.     traZODone  (DESYREL ) 100 MG tablet Take 1 tablet (100 mg total) by mouth at bedtime. 90 tablet 0   VITAMIN D  PO Take 1 capsule by mouth every Thursday.     No current facility-administered medications on file prior to visit.    ALLERGIES: Allergies  Allergen Reactions   Fish Allergy  Anaphylaxis, Shortness Of Breath and Swelling    Throat closes Specifically salmon and tilapia     FAMILY HISTORY: Family History  Problem Relation Age of Onset   Asthma Mother    Liver cancer Mother    Heart Problems Mother        heart attack and heart failure   Kidney Stones Mother    Osteoporosis Mother    Stroke Mother    Depression Mother    Anxiety disorder Mother    Kidney Stones Father    Hernia Father    Kidney disease Father    Depression Sister    Anxiety disorder Sister    Asthma Sister    Lung cancer Maternal Grandmother    Rectal cancer Maternal Grandfather    Skin cancer Paternal Grandmother        mets to stomach   Mental illness Half-Sister    Cancer Other    ADD / ADHD Son    Bipolar disorder Daughter       Objective:  Blood pressure 120/79, pulse  63, height 4' 10 (1.473 m), weight 187 lb (84.8 kg), SpO2 95%.. General: No acute distress.  Patient appears well-groomed.        Juliene Dunnings, DO  CC: Ozell Provencal, MD

## 2023-11-16 NOTE — Progress Notes (Signed)
    SUBJECTIVE:   CHIEF COMPLAINT / HPI: hospital f/u  Follow-up recommendations 10/30/23 Evaluate patient's eliquis  adherence for antiphospholipid syndrome Taking Eliquis  twice per day Recommend follow-up with GI regarding hiatal hernia/torturous esophagus from prior EGD with them in July given continued dysphagia, has EGD scheduled for 01/03/24 with them. Still having stomach pain, hospitalized for bowel obstruction Is bearable, has not gotten worse, but says it is still a 10. Last bowel movement was yesterday, brown, normal size No vomiting, but nauseas Has appointment with GI October Reports esomeprazole and carafate have not been helping Brings in esomeprazole and carafate which she says she is taking Follow-up with urology outpatient if patient still having hematuria, and for her renal cyst. Normal UA in ED 09/08. Patient reports no history of gross hematuria.  Seen in ED 09/08 for dizziness Meclizine  sent in, but unable to get because sent to wrong pharmacy. Still having symptoms of room spinning. Worse with going sleep, moving head rapidly  PERTINENT  PMH / PSH: APLS, SLE, IDA, GERD  OBJECTIVE:   BP 110/70   Pulse 74   Ht 4' 10 (1.473 m)   Wt 185 lb (83.9 kg)   SpO2 98%   BMI 38.67 kg/m   General: NAD, well appearing Neuro: A&O, symmetric, no focal deficits, PERRL Cardiovascular: RRR, no murmurs,  Respiratory: normal WOB on RA, CTAB, no wheezes, ronchi or rales Abdomen: soft, NTTP, no rebound or guarding Extremities: Moving all 4 extremities equally, no peripheral edema VOMS testing positive Dix-Hallpike negative.   ASSESSMENT/PLAN:   Assessment & Plan Gastroesophageal reflux disease, unspecified whether esophagitis present Small bowel obstruction (HCC) Hiatal hernia Pain controlled, but remains significant. Benign abdominal exam, no SBO symptoms since discharge.  Continue esomeprazole and Carafate.  Continue follow-up with GI and general surgery in late  October/November. Antiphospholipid antibody syndrome (HCC) Compliant with Eliquis , follow-up with Heme/Onc. Dizziness Re-sent meclizine  to patient's [facility] pharmacy per request.  Return in about 3 months (around 02/15/2024).  Ozell Provencal, MD [facility]

## 2023-11-17 ENCOUNTER — Encounter: Payer: Self-pay | Admitting: Neurology

## 2023-11-17 ENCOUNTER — Other Ambulatory Visit: Payer: Self-pay

## 2023-11-17 ENCOUNTER — Ambulatory Visit (INDEPENDENT_AMBULATORY_CARE_PROVIDER_SITE_OTHER): Admitting: Neurology

## 2023-11-17 VITALS — BP 120/79 | HR 63 | Ht <= 58 in | Wt 187.0 lb

## 2023-11-17 DIAGNOSIS — G43809 Other migraine, not intractable, without status migrainosus: Secondary | ICD-10-CM | POA: Diagnosis not present

## 2023-11-17 DIAGNOSIS — G43009 Migraine without aura, not intractable, without status migrainosus: Secondary | ICD-10-CM

## 2023-11-17 MED ORDER — UBRELVY 100 MG PO TABS: 1.0000 | ORAL_TABLET | ORAL | 11 refills | Status: AC | PRN

## 2023-11-17 MED ORDER — ONDANSETRON 4 MG PO TBDP: 4.0000 mg | ORAL_TABLET | Freq: Three times a day (TID) | ORAL | 5 refills | Status: AC | PRN

## 2023-11-17 NOTE — Transitions of Care (Post Inpatient/ED Visit) (Signed)
 Transition of Care week 3  Visit Note  11/17/2023  Name: Melissa Gaines MRN: [id number]          DOB: 1971/01/07  Situation: Patient enrolled in [facility] 30-day program. Visit completed with patient by telephone.   Background:   Initial Transition Care Management Follow-up Telephone Call    Past Medical History:  Diagnosis Date   Anemia    Anxiety    Arthritis    Asthma    Cancer (HCC)    skin cancer   Chronic pain    Complication of anesthesia    Asthma attack during colonoscopy and EGD   Congenital hip dislocation    Depression    GERD (gastroesophageal reflux disease)    Hx pulmonary embolism 07/08/2017   Iron deficiency anemia 04/14/2018   Kidney mass 2017   Migraine    Osteoporosis    Pathological dislocation of shoulder joint, bilateral    congential   Pneumonia    as a child   PTSD (post-traumatic stress disorder)    Sleep apnea    Hx. of no CPAP   Systemic lupus erythematosus (HCC)    Tuberculosis    as a child was treated   Urticaria     Assessment: Patient Reported Symptoms: Cognitive Cognitive Status: No symptoms reported, Alert and oriented to person, place, and time, Insightful and able to interpret abstract concepts, Normal speech and language skills      Neurological Neurological Review of Symptoms: Dizziness Neurological Management Strategies: Medication therapy, Routine screening, Activity, Coping strategies Neurological Comment: Went to ED on 11/14/23, but no real diagnosis per patient.  HEENT HEENT Symptoms Reported: Change or loss of hearing HEENT Management Strategies: Medical device, Routine screening, Coping strategies HEENT Self-Management Outcome: 4 (good)    Cardiovascular Cardiovascular Symptoms Reported: No symptoms reported    Respiratory Respiratory Symptoms Reported: No symptoms reported    Endocrine Endocrine Symptoms Reported: No symptoms reported Is patient diabetic?: No    Gastrointestinal Gastrointestinal  Symptoms Reported: Abdominal pain or discomfort Gastrointestinal Management Strategies: Activity, Adequate rest, Coping strategies (Follow up appointments) Gastrointestinal Self-Management Outcome: 2 (bad)    Genitourinary Genitourinary Symptoms Reported: Incontinence Genitourinary Management Strategies: Incontinence garment/pad Genitourinary Self-Management Outcome: 4 (good)  Integumentary Integumentary Symptoms Reported: No symptoms reported    Musculoskeletal Musculoskelatal Symptoms Reviewed: Weakness Musculoskeletal Management Strategies: Medical device, Medication therapy, Routine screening, Coping strategies, Adequate rest, Activity Musculoskeletal Self-Management Outcome: 3 (uncertain) Falls in the past year?: Yes (No falls reported this week per patient stated.) Number of falls in past year: 1 or less Was there an injury with Fall?: No Fall Risk Category Calculator: 1 Patient Fall Risk Level: Low Fall Risk Patient at Risk for Falls Due to: Impaired mobility Fall risk Follow up: Falls prevention discussed  Psychosocial Additional Psychological Details: Some changes noted from last week in the depression score. Patient frustrated with no answers to her abdominal pain.   Major Change/Loss/Stressor/Fears (CP): Medical condition, self Do you feel physically threatened by others?: No   There were no vitals filed for this visit.  Medications Reviewed Today     Reviewed by Carolee Heron NOVAK, RN (Case Manager) on 11/17/23 at 1356  Med List Status: <None>   Medication Order Taking? Sig Documenting Provider Last Dose Status Informant  albuterol  (VENTOLIN  HFA) 108 (90 Base) MCG/ACT inhaler [id number] Yes Inhale 2 puffs into the lungs every 6 (six) hours as needed for wheezing or shortness of breath. Donzetta Rollene BRAVO, MD  Active Self, Pharmacy Records  atorvastatin  (LIPITOR) 80 MG tablet [id number] Yes Take 1 tablet (80 mg total) by mouth daily. Alba Sharper, MD  Active   azelastine   (ASTELIN ) 0.1 % nasal spray [id number] Yes Place 1-2 sprays into both nostrils 2 (two) times daily as needed (nasal drainage). Use in each nostril as directed Luke Orlan HERO, DO  Active   budesonide -formoterol  (SYMBICORT ) 160-4.5 MCG/ACT inhaler [id number] Yes Inhale 2 puffs into the lungs in the morning and at bedtime. with spacer and rinse mouth afterwards. Luke Orlan HERO, DO  Active   diclofenac  Sodium (VOLTAREN  ARTHRITIS PAIN) 1 % GEL [id number] Yes Apply 4 g topically 4 (four) times daily. Christia Budds, MD  Active Self, Pharmacy Records  ELIQUIS  5 MG TABS tablet [id number] Yes TAKE 1 TABLET BY MOUTH TWICE  DAILY NEEDS PCP APPOINTMENT  BEFORE MORE FILLS Christia Budds, MD  Active Self, Pharmacy Records           Med Note (COFFELL, JON HERO   Mon Oct 31, 2023 10:01 AM)    EPINEPHrine  0.3 mg/0.3 mL IJ SOAJ injection [id number] Yes INJECT INTRAMUSCULARLY 1 PEN AS  NEEDED FOR ALLERGIC RESPONSE AS  DIRECTED BY MD. [facility]  [facility] AFTER USE. Cheryl Reusing, FNP  Active Self, Pharmacy Records           Med Note (SATTERFIELD, TEENA FORBES Repress Oct 30, 2023  2:27 PM)    Erenumab -aooe (AIMOVIG ) 140 MG/ML EMMANUEL [id number] Yes Inject 140 mg into the skin every 30 (thirty) days. Skeet Juliene SAUNDERS, DO  Active Self, Pharmacy Records           Med Note (COFFELL, JON HERO   Mon Oct 31, 2023 10:13 AM) Last injection > 30 days, patient states she had several vaccines this month so she skipped this injection.  esomeprazole (NEXIUM) 40 MG capsule [id number] Yes Take 40 mg by mouth. [provider]  Active   ezetimibe  (ZETIA ) 10 MG tablet [id number] Yes TAKE 1 TABLET BY MOUTH DAILY Quillen, Michael, MD  Active   famotidine  (PEPCID ) 20 MG tablet [id number] Yes TAKE 1 TABLET BY MOUTH TWICE  DAILY Lorin Norris, MD  Active Self, Pharmacy Records  ferrous sulfate  325 (65 FE) MG tablet [id number] Yes Take 1 tablet (325 mg total) by mouth 3 (three) times daily with meals. Burton, Lacie K, NP  Active Self, Pharmacy Records            Med Note (COFFELL, JON HERO   Mon Oct 31, 2023 10:10 AM)    fluticasone  (FLONASE ) 50 MCG/ACT nasal spray [id number] Yes Place 1-2 sprays into both nostrils daily as needed (nasal congestion). Luke Orlan HERO, DO  Active   levocetirizine (XYZAL ) 5 MG tablet [id number] Yes Take 1 tablet (5 mg total) by mouth every evening. Luke Orlan HERO, DO  Active   meclizine  (ANTIVERT ) 25 MG tablet [id number] Yes Take 1 tablet (25 mg total) by mouth 3 (three) times daily as needed for dizziness. Alba Sharper, MD  Active   ondansetron  (ZOFRAN -ODT) 4 MG disintegrating tablet [id number] Yes Take 1 tablet (4 mg total) by mouth every 8 (eight) hours as needed. Skeet Juliene SAUNDERS, DO  Active   sucralfate (CARAFATE) 1 g tablet [id number] Yes Take 1 g by mouth. [provider]  Active   traZODone  (DESYREL ) 100 MG tablet [id number] Yes Take 1 tablet (100 mg total) by mouth at bedtime. Arfeen, Syed T, MD  Active Self, Pharmacy Records           Med Note (  SATTERFIELD, TEENA BRAVO   Sun Oct 30, 2023  2:38 PM) Patient verified she is taking every night   Ubrogepant  (UBRELVY ) 100 MG TABS [id number] Yes Take 1 tablet (100 mg total) by mouth as needed (May repeat in 2 hours.  Maximum 2 tablets in 24 hours). Skeet Juliene SAUNDERS, DO  Active   VITAMIN D  PO [id number] Yes Take 1 capsule by mouth every Thursday. [provider]  Active Self, Pharmacy Records           Med Note (COFFELL, JON HERO   Mon Oct 31, 2023 10:14 AM) Patient states she takes an OTC vitamin D  taken once a week - unable to confirm if D2 or D3, unable to confirm strength.              Recommendation:   Continue Current Plan of Care  Follow Up Plan:   Telephone follow-up in 1 week on 11/24/23 at 2 pm   Bing Edison MSN, RN RN Case Manager [facility]  [facility]-Population [facility] Office Hours M-F [phone] Direct Dial: [phone] Main Phone [phone]  Fax: [phone] [email or web address]

## 2023-11-17 NOTE — Patient Instructions (Signed)
 Continue Aimovig  every 4 weeks Take Ubrelvy  and ondansetron  for the migraine and dizzy spell.  May use meclizine  if needed for dizziness but use sparingly

## 2023-11-17 NOTE — Patient Instructions (Signed)
 Visit Information  Thank you for taking time to visit with me today. Please don't hesitate to contact me if I can be of assistance to you before our next scheduled telephone appointment.  Our next appointment is by telephone on 11/24/23 at 2pm  Following is a copy of your care plan:   Goals Addressed             This Visit's Progress    [facility]BCI Transitions of Care (TOC) Care Plan   On track    Reviewed on call with patient 11/17/23:   Problems:  Recent Hospitalization for treatment of Small Bowel Obstruction Knowledge Deficit Related to Small Bowel Obstruction SBO was ruled out, patient feels pain is from documented hernia/tortuous esophagus.   Goal:  Over the next 30 days, the patient will not experience hospital readmission  Interventions:  Transitions of Care: Doctor Visits  - discussed the importance of doctor visits Patient completed referral to GI consult for ongoing abdominal pain. New Meds given but patient stated made her dizzy and she had and ED visit on 11/14/23 with no admission.  Another follow up for surgical evaluation in November 2025 Patient still has stated pain in abdomen of 8/10 and that nothing relieves it.  Advised patient to call PCP for closer follow up appointment PCP HFU on 9/10: patient needs 3 days notice for appointments due to medicaid transportation Completed 11/10/23 post discharge follow up with surgeon: COMPLETED Encouraged patient to speak up about ongoing pain level of 8 with ongoing abdominal pain.  Denies blood in stools, reports normal bowel movements.   Patient Self Care Activities:  Attend all scheduled provider appointments Call pharmacy for medication refills 3-7 days in advance of running out of medications Call provider office for new concerns or questions  Notify RN Care Manager of TOC call rescheduling needs Participate in Transition of Care Program/Attend TOC scheduled calls Perform all self care activities independently  Take  medications as prescribed   Call your doctor now or seek immediate medical care if: You have a fever. You are vomiting. You have new or worse belly pain. You cannot pass stools or gas. Complete all follow up appointments with enough time to make transportation arrangements.  Declined BSW referral stating was given resources in hospital.  patient states there is nothing really available besides her payor benefits.   Plan:  Telephone follow up appointment with care management team member scheduled for:  11/24/23 at 2 pm          Patient verbalizes understanding of instructions and care plan provided today and agrees to view in MyChart. Active MyChart status and patient understanding of how to access instructions and care plan via MyChart confirmed with patient.     Telephone follow up appointment with care management team member scheduled for:  Please call the care guide team at [phone] if you need to cancel or reschedule your appointment.   Please call the Suicide and Crisis Lifeline: 988 call 1-800-273-TALK (toll free, 24 hour hotline) call 911 if you are experiencing a Mental Health or Behavioral Health Crisis or need someone to talk to.   Bing Edison MSN, RN RN Case Sales executive Health  [facility]BC[facility]-Population Health Office Hours M-F [phone] Direct Dial: [phone] Main Phone [phone]  Fax: [phone] [email or web address]

## 2023-11-18 ENCOUNTER — Other Ambulatory Visit: Payer: Self-pay

## 2023-11-18 DIAGNOSIS — D500 Iron deficiency anemia secondary to blood loss (chronic): Secondary | ICD-10-CM

## 2023-11-18 DIAGNOSIS — D508 Other iron deficiency anemias: Secondary | ICD-10-CM

## 2023-11-21 ENCOUNTER — Inpatient Hospital Stay: Attending: Nurse Practitioner

## 2023-11-21 ENCOUNTER — Inpatient Hospital Stay

## 2023-11-21 DIAGNOSIS — D500 Iron deficiency anemia secondary to blood loss (chronic): Secondary | ICD-10-CM | POA: Diagnosis not present

## 2023-11-21 DIAGNOSIS — E538 Deficiency of other specified B group vitamins: Secondary | ICD-10-CM | POA: Diagnosis not present

## 2023-11-21 DIAGNOSIS — D508 Other iron deficiency anemias: Secondary | ICD-10-CM

## 2023-11-21 LAB — CBC WITH DIFFERENTIAL (CANCER CENTER ONLY)
Abs Immature Granulocytes: 0.02 K/uL (ref 0.00–0.07)
Basophils Absolute: 0 K/uL (ref 0.0–0.1)
Basophils Relative: 1 %
Eosinophils Absolute: 0.2 K/uL (ref 0.0–0.5)
Eosinophils Relative: 3 %
HCT: 40.4 % (ref 36.0–46.0)
Hemoglobin: 13.8 g/dL (ref 12.0–15.0)
Immature Granulocytes: 0 %
Lymphocytes Relative: 40 %
Lymphs Abs: 2.6 K/uL (ref 0.7–4.0)
MCH: 28.8 pg (ref 26.0–34.0)
MCHC: 34.2 g/dL (ref 30.0–36.0)
MCV: 84.2 fL (ref 80.0–100.0)
Monocytes Absolute: 0.3 K/uL (ref 0.1–1.0)
Monocytes Relative: 5 %
Neutro Abs: 3.4 K/uL (ref 1.7–7.7)
Neutrophils Relative %: 51 %
Platelet Count: 293 K/uL (ref 150–400)
RBC: 4.8 MIL/uL (ref 3.87–5.11)
RDW: 14 % (ref 11.5–15.5)
WBC Count: 6.5 K/uL (ref 4.0–10.5)
nRBC: 0 % (ref 0.0–0.2)

## 2023-11-21 LAB — CMP (CANCER CENTER ONLY)
ALT: 10 U/L (ref 0–44)
AST: 9 U/L — ABNORMAL LOW (ref 15–41)
Albumin: 4 g/dL (ref 3.5–5.0)
Alkaline Phosphatase: 59 U/L (ref 38–126)
Anion gap: 7 (ref 5–15)
BUN: 5 mg/dL — ABNORMAL LOW (ref 6–20)
CO2: 25 mmol/L (ref 22–32)
Calcium: 8.9 mg/dL (ref 8.9–10.3)
Chloride: 105 mmol/L (ref 98–111)
Creatinine: 0.41 mg/dL — ABNORMAL LOW (ref 0.44–1.00)
GFR, Estimated: >60 mL/min (ref >60)
Glucose, Bld: 78 mg/dL (ref 70–99)
Potassium: 3.8 mmol/L (ref 3.5–5.1)
Sodium: 137 mmol/L (ref 135–145)
Total Bilirubin: 0.3 mg/dL (ref 0.0–1.2)
Total Protein: 7.1 g/dL (ref 6.5–8.1)

## 2023-11-21 LAB — IRON AND IRON BINDING CAPACITY (CC-WL,HP ONLY)
Iron: 70 ug/dL (ref 28–170)
Saturation Ratios: 19 % (ref 10.4–31.8)
TIBC: 371 ug/dL (ref 250–450)
UIBC: 301 ug/dL (ref 148–442)

## 2023-11-21 LAB — FERRITIN: Ferritin: 79 ng/mL (ref 11–307)

## 2023-11-21 MED ORDER — CYANOCOBALAMIN 1000 MCG/ML IJ SOLN
1000.0000 ug | INTRAMUSCULAR | Status: DC
  Administered 2023-11-21: 1000 ug via INTRAMUSCULAR
  Filled 2023-11-21: qty 1

## 2023-11-23 ENCOUNTER — Other Ambulatory Visit (HOSPITAL_COMMUNITY): Payer: Self-pay | Admitting: Psychiatry

## 2023-11-23 ENCOUNTER — Ambulatory Visit: Admitting: Physical Therapy

## 2023-11-23 DIAGNOSIS — F331 Major depressive disorder, recurrent, moderate: Secondary | ICD-10-CM

## 2023-11-23 DIAGNOSIS — F4310 Post-traumatic stress disorder, unspecified: Secondary | ICD-10-CM

## 2023-11-23 LAB — CORTISOL: Cortisol, Plasma: 20.6 ug/dL

## 2023-11-23 LAB — DEXAMETHASONE, BLOOD: Dexamethasone, Serum: 572 ng/dL

## 2023-11-23 NOTE — Therapy (Deleted)
 OUTPATIENT PHYSICAL THERAPY TREATMENT NOTE   Patient Name: Melissa Gaines MRN: [id number] DOB:12-Jul-1970, 53 y.o., female Today's Date: 11/23/2023  END OF SESSION:        Past Medical History:  Diagnosis Date   Anemia    Anxiety    Arthritis    Asthma    Cancer (HCC)    skin cancer   Chronic pain    Complication of anesthesia    Asthma attack during colonoscopy and EGD   Congenital hip dislocation    Depression    GERD (gastroesophageal reflux disease)    Hx pulmonary embolism 07/08/2017   Iron deficiency anemia 04/14/2018   Kidney mass 2017   Migraine    Osteoporosis    Pathological dislocation of shoulder joint, bilateral    congential   Pneumonia    as a child   PTSD (post-traumatic stress disorder)    Sleep apnea    Hx. of no CPAP   Systemic lupus erythematosus (HCC)    Tuberculosis    as a child was treated   Urticaria    Past Surgical History:  Procedure Laterality Date   CESAREAN SECTION  2005   CESAREAN SECTION W/BTL  2010   COLONOSCOPY N/A 09/15/2023   Procedure: COLONOSCOPY;  Surgeon: Leigh Elspeth SQUIBB, MD;  Location: [facility];  Service: Gastroenterology;  Laterality: N/A;   ESOPHAGOGASTRODUODENOSCOPY N/A 09/15/2023   Procedure: EGD (ESOPHAGOGASTRODUODENOSCOPY);  Surgeon: Leigh Elspeth SQUIBB, MD;  Location: [facility];  Service: Gastroenterology;  Laterality: N/A;   HIP SURGERY     in childhood for congenital hip dislocation   LAPAROSCOPIC GASTRIC SLEEVE RESECTION  2014   POLYPECTOMY  09/15/2023   Procedure: POLYPECTOMY, INTESTINE;  Surgeon: Leigh Elspeth SQUIBB, MD;  Location: [facility];  Service: Gastroenterology;;   HARLEY DILATION N/A 09/15/2023   Procedure: EGD, WITH DILATION USING SAVARY-GILLIARD DILATOR OVER GUIDEWIRE;  Surgeon: Leigh Elspeth SQUIBB, MD;  Location: [facility];  Service: Gastroenterology;  Laterality: N/A;   STAPEDECTOMY  11/01/2011   L postauricular stapedectomy with CO2 laser and insertion of  6 x 4.75 mm SMart Piston   TOTAL HIP ARTHROPLASTY     05/2000 R, 11/2000 L   Patient Active Problem List   Diagnosis Date Noted   Hiatal hernia 11/16/2023   Small bowel obstruction (HCC) 10/30/2023   Chronic health problem 10/30/2023   Dysphagia 09/15/2023   Benign neoplasm of colon 09/15/2023   B12 deficiency 05/11/2022   Hyperlipidemia 05/07/2022   Prediabetes 05/07/2022   Environmental and seasonal allergies 08/03/2021   History of colonoscopy 07/31/2021   Asthma 07/01/2021   Gastroesophageal reflux disease 08/19/2020   Tinnitus aurium, 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   Lumbar back pain 01/25/2020   Left hip pain 06/26/2019   Muscle strain of lower extremity, left, initial encounter 06/26/2019   Iron deficiency anemia 04/14/2018   Anemia 03/29/2018   Hematuria, gross 03/29/2018   Antiphospholipid antibody syndrome (HCC) 01/25/2018   Morbid obesity (HCC) 07/26/2017   Systemic lupus erythematosus (HCC) 07/08/2017   Chronic pain of right knee 05/23/2017   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/21/2016   Vitamin D  deficiency 12/16/2016   Migraine     PCP: Alba Sharper, MD  REFERRING PROVIDER: Madelon Donald HERO, DO  REFERRING DIAG: Functional gait abnormality [R26.89], Chronic pain of right knee [M25.561, G89.29], Lumbar back pain [M54.50]   Rationale for Evaluation and Treatment: Rehabilitation  THERAPY DIAG:  No diagnosis found.  PERTINENT HISTORY: Speech impediment, anemia, lupus, tinnitus, astham, prediabetes, vit D deficiency, knee surgery in early 2000s  WEIGHT BEARING RESTRICTIONS: No  FALLS:  Has patient fallen in last 6 months? Yes. Number of falls 5, knee gave out and she lost her balance, multiple times on the stairs   LIVING ENVIRONMENT: Lives with: lives with their family Lives in: House/apartment Stairs: Yes: External: 12 steps; recently moved to the first floor of complex Has following equipment at home: Single point cane and Walker - 4 wheeled  OCCUPATION: on  disability   PRECAUTIONS: None ---------------------------------------------------------------------------------------------  SUBJECTIVE:                                                                                                                                                                                           SUBJECTIVE STATEMENT:  No new c/o today.  Continues with R sided pain in low back/hip, 8/10   Eval statement 09/29/2023: Has been having pain for many years, pain has slowly gotten worse, recently in past 2 months has fallen 5 times because her R knee has buckled and she lost her balance. Started using a FWW to ambulate and has not had falls since, however has dropped the walker a few times, and does not always use it for short distances. Pain: 9/10 in R knee, feel like an electrocution or numbness, or locking feeling.  RED FLAGS: None    PLOF: Independent  PATIENT GOALS: stop the knee from giving away  NEXT MD VISIT: need to schedule ---------------------------------------------------------------------------------------------  OBJECTIVE:  Note: Objective measures were completed at Evaluation unless otherwise noted.  DIAGNOSTIC FINDINGS:  No pertinent imaging in chart  PATIENT SURVEYS:  LEFS  Extreme difficulty/unable (0), Quite a bit of difficulty (1), Moderate difficulty (2), Little difficulty (3), No difficulty (4) Survey date:  09/29/2023  Any of your usual work, housework or school activities   2. Usual hobbies, recreational or sporting activities   3. Getting into/out of the bath   4. Walking between rooms   5. Putting on socks/shoes   6. Squatting    7. Lifting an object, like a bag of groceries from the floor   8. Performing light activities around your home   9. Performing heavy activities around your home   10. Getting into/out of a car   11. Walking 2 blocks   12. Walking 1 mile   13. Going up/down 10 stairs (1 flight)   14. Standing for 1  hour   15.  sitting for 1 hour   16. Running on even ground   17. Running on uneven ground   18. Making sharp turns while running fast   19. Hopping    20. Rolling over in bed  Score total:  7/80     COGNITION: Overall cognitive status: Within functional limits for tasks assessed   PALPATION: Tenderness around R knee  SENSATION: Light touch: Impaired  around RLE  MUSCLE LENGTH: Hamstrings:B WFL  Thomas test: Right Limited R gastroc Limited  POSTURE: rounded shoulders and forward head   LUMBAR ROM:   AROM eval  Flexion   Extension   Right lateral flexion   Left lateral flexion   Right rotation   Left rotation    (Blank rows = not tested)  ! Indicates pain with testing  LOWER EXTREMITY ROM:     Active  Right eval Left eval  Hip flexion    Hip extension    Hip abduction    Hip adduction    Hip internal rotation    Hip external rotation    Knee flexion    Knee extension    Ankle dorsiflexion    Ankle plantarflexion    Ankle inversion    Ankle eversion     (Blank rows = not tested)  ! Indicates pain with testing  LOWER EXTREMITY MMT:    MMT Right eval Left eval  Hip flexion 100 WFL  Hip extension [facility]  Hip abduction    Hip adduction    Hip internal rotation    Hip external rotation    Knee flexion 110 WFL  Knee extension 0 8  Ankle dorsiflexion 10 10  Ankle plantarflexion    Ankle inversion    Ankle eversion     (Blank rows = not tested)   ! Indicates pain with testing LUMBAR SPECIAL TESTS:  Prone instability test: Positive  FUNCTIONAL TESTS:  30 seconds chair stand test 10  GAIT: Distance walked: 172ft Assistive device utilized: Environmental consultant - 4 wheeled Level of assistance: Modified independence Comments: L WS, R trendelenburg, reduced RLE quad recruitment in stance  [facility] Adult PT Treatment:                                                DATE: 10/26/2023 Therapeutic Activity: NuStep 8' for activity tolerance Supine Resisted  marching 2x12B, hold 1s, RTB Supine SLR  2x12, hold 5s, RLE STS staggered stance 2x12, RLE bias Bent knee fall out 2x12 B, 2s hold, GTB  [facility] Adult PT Treatment:                                                DATE: 10/18/2023  Therapeutic Exercise: Gastroc stretch on slant board 2x1' Runners step on 4 box 2x8 B, UE a. PRN Therapeutic Activity: NuStep 8' for activity tolerance Resisted marching 2x12B, hold 1s, RTB Slant board heel raise  2x15, hold 3s TKE on RLE w/ball at wall 2x10, hold 8s  PATIENT EDUCATION:  Education details: Pt received education regarding HEP performance, ADL performance, functional activity tolerance, impairment education, appropriate performance of therapeutic activities. DKE use Person educated: Patient Education method: Explanation, Demonstration, Tactile cues, Verbal cues, and Handouts Education comprehension: verbalized understanding and returned demonstration  HOME EXERCISE PROGRAM: Access Code: [email or web address] URL: [email or web address] Date: 09/29/2023 Prepared by: Mabel Kiang  Exercises - Sit to Stand Without Arm Support  - 1 x daily - 4 x weekly - 2-3 sets - 15 reps -  Sidelying Hip Abduction  - 1 x daily - 4 x weekly - 2-3 sets - 10 reps - 3s hold - Supine March  - 1 x daily - 7 x weekly - 2-3 sets - 10 reps - Active Straight Leg Raise with Quad Set  - 1 x daily - 4 x weekly - 2-3 sets - 15 reps - 3s hold ---------------------------------------------------------------------------------------------  ASSESSMENT:  CLINICAL IMPRESSION:  Pt attended physical therapy session for continuation of treatment regarding R knee pain. Today's treatment focused on improvement of  Activity tolerance, R hip/knee motor recruitment, proximal hip strengthening, and transfer quality. Pt has had some attendance issues with aquatics which is related to medical transportation scheduling and mal organization. Pt showed great tolerance to administered  treatment with no adverse effects by the end of session. Skilled intervention was utilized via activity modification for pt tolerance with task completion, functional progression/regression promoting best outcomes inline with current rehab goals, as well as minimal verbal/tactile cuing alongside no physical assistance for safe and appropriate performance of today's activities. Continue to progress as tolerated with current POC focus.  Eval impression (09/29/2023): Pt. attended today's physical therapy session for evaluation of gait and R knee pain. Pt has complaints of R knee pain and buckling causing. Pt has notable deficits and would benefit from therapeutic focus on knee AROM, strength, muscle contraction, muscle length, and gait quality.Treatment performed today focused on pt education detailed in the objective. Pt demonstrated great understanding of education provided. required moderate cues and supervisory assistance for appropriate performance with today's activities. Pt requires the intervention of skilled outpatient physical therapy to address the aforementioned deficits and progress towards a functional level in line with therapeutic goals.    OBJECTIVE IMPAIRMENTS: Abnormal gait, decreased mobility, difficulty walking, decreased ROM, decreased strength, improper body mechanics, postural dysfunction, and pain.   ACTIVITY LIMITATIONS: standing, squatting, sleeping, stairs, dressing, self feeding, and locomotion level  PARTICIPATION LIMITATIONS: meal prep, cleaning, shopping, and community activity  PERSONAL FACTORS: Behavior pattern, Fitness, Time since onset of injury/illness/exacerbation, Transportation, and 3+ comorbidities: see pmh are also affecting patient's functional outcome.   REHAB POTENTIAL: Fair personal factors  CLINICAL DECISION MAKING: Stable/uncomplicated  EVALUATION COMPLEXITY: Low   GOALS: Goals reviewed with patient? YES  SHORT TERM GOALS: Target date:  10/20/2023  Pt will be independent with administered HEP to demonstrate the competency necessary for long term managemnet of symptoms at home. Baseline: Goal status: INITIAL   LONG TERM GOALS: Target date: 11/10/2023  Pt. Will achieve a LEFS score of 20/80 as to demonstrate improvement in self-perceived functional ability with daily activities.  Baseline:  Goal status: INITIAL  2.  Pt will improve Global hip strength to a 4+/5 to demonstrate improvement in strength for quality of motion and activity performance.  Baseline:  Goal status: INITIAL  3.   Pt will independently ambulate 10 minutes with LRAD and reduced R sided trendelenburg to demonstrate improved weightbearing tolerance, BLE strength, and functional capacity for community ambulation.  Baseline:  Goal status: INITIAL  4.  Pt will improve 30s STS to 12 to demonstrate improved BLE strength, coordination, and functional transfer ability necessary for daily life Baseline: 8 Goal status: INITIAL ---------------------------------------------------------------------------------------------  PLAN:  PT FREQUENCY: 1-2x/week  PT DURATION: 6 weeks  PLANNED INTERVENTIONS: 97110-Therapeutic exercises, 97530- Therapeutic activity, [id number]- Neuromuscular re-education, 97535- Self Care, [id number]- Manual therapy, [phone]- Gait training, [phone]- Aquatic Therapy, Patient/Family education, Balance training, Stair training, Taping, Joint mobilization, and Spinal mobilization.  PLAN FOR NEXT SESSION: Continue  to progress as tolerated within current POC focus.  Tzion Wedel E Jabrea Kallstrom PT 11/23/2023, 9:06 AM

## 2023-11-24 ENCOUNTER — Telehealth: Payer: Self-pay

## 2023-11-24 ENCOUNTER — Ambulatory Visit (HOSPITAL_BASED_OUTPATIENT_CLINIC_OR_DEPARTMENT_OTHER): Admitting: Psychiatry

## 2023-11-24 ENCOUNTER — Encounter (HOSPITAL_COMMUNITY): Payer: Self-pay | Admitting: Psychiatry

## 2023-11-24 ENCOUNTER — Other Ambulatory Visit: Payer: Self-pay

## 2023-11-24 VITALS — BP 123/83 | HR 76 | Ht <= 58 in | Wt 189.0 lb

## 2023-11-24 DIAGNOSIS — F4310 Post-traumatic stress disorder, unspecified: Secondary | ICD-10-CM | POA: Diagnosis not present

## 2023-11-24 DIAGNOSIS — F331 Major depressive disorder, recurrent, moderate: Secondary | ICD-10-CM | POA: Diagnosis not present

## 2023-11-24 MED ORDER — GABAPENTIN 300 MG PO CAPS: 300.0000 mg | ORAL_CAPSULE | Freq: Every day | ORAL | 0 refills | Status: DC

## 2023-11-24 MED ORDER — TRAZODONE HCL 100 MG PO TABS: 100.0000 mg | ORAL_TABLET | Freq: Every day | ORAL | 0 refills | Status: DC

## 2023-11-24 NOTE — Patient Instructions (Signed)
 Visit Information  Thank you for taking time to visit with me today. Please don't hesitate to contact me if I can be of assistance to you before our next scheduled telephone appointment.  The next appointment is by telephone on 12/01/23 at 2 pm: Telephone follow up appointment with care management team member scheduled for:  12/01/23 at 2 pm with Shona Prow RN CM   Following is a copy of your care plan:   Goals Addressed             This Visit's Progress    VBCI Transitions of Care (TOC) Care Plan   On track    Reviewed on call with patient 11/24/23:   Problems:  Recent Hospitalization for treatment of Small Bowel Obstruction Knowledge Deficit Related to Small Bowel Obstruction SBO was ruled out, patient feels pain is from documented hernia/tortuous esophagus. Another GI consult in November 2025.  Chronic issues added 11/24/23:  Vestibular migraines: Completed neurologist follow up 11/17/23 [facility] on neurology notes of SLE, some cervical issues, antiphospholipid syndrome, PTSD (in treatment).   Goal:  Over the next 30 days, the patient will not experience hospital readmission  Interventions:  Transitions of Care: Doctor Visits  - discussed the importance of doctor visits and new consult and completed follow ups.  Neurology follow up on 11/17/23 completed, to keep a migraine log, Patient reports dizziness has improved.  Neurologist per patient, feels it is from migraines.  To start OP Neuro-Rehab for vestibular issues.  Patient completed referral to GI consult for ongoing abdominal pain. New Meds given but patient stated made her dizzy and she had and ED visit on 11/14/23 with no admission and no diagnosis.  Another follow up for surgical evaluation in November 2025 Patient still has stated pain in abdomen of 8/10 and that nothing relieves it, but patient states it is manageable on 11/24/23.  Advised patient to call PCP for closer follow up appointment PCP HFU on 9/10: patient needs 3  days notice for appointments due to medicaid transportation Completed 11/10/23 post discharge follow up with surgeon: COMPLETED Encouraged patient to speak up about ongoing pain level of 8 with ongoing abdominal pain.  Denies blood in stools, reports normal bowel movements.    Patient Self Care Activities:  Attend all scheduled provider appointments Call pharmacy for medication refills 3-7 days in advance of running out of medications Call provider office for new concerns or questions  Notify RN Care Manager of TOC call rescheduling needs Participate in Transition of Care Program/Attend TOC scheduled calls Perform all self care activities independently  Take medications as prescribed   Call your doctor now or seek immediate medical care if: You have a fever. You are vomiting. You have new or worse belly pain. You cannot pass stools or gas. Complete all follow up appointments with enough time to make transportation arrangements.  Declined BSW referral stating was given resources in hospital.  Patient states there is nothing really available besides her payor benefits.   Plan:  Telephone follow up appointment with care management team member scheduled for:  12/01/23 at 2 pm with Shona Prow RN CM         Patient verbalizes understanding of instructions and care plan provided today and agrees to view in MyChart. Active MyChart status and patient understanding of how to access instructions and care plan via MyChart confirmed with patient.     Telephone follow up appointment with care management team member scheduled for:  Telephone follow up appointment  with care management team member scheduled for:  12/01/23 at 2 pm with Shona Prow RN CM   Please call the care guide team at [phone] if you need to cancel or reschedule your appointment.   Please call the Suicide and Crisis Lifeline: 988 call 1-800-273-TALK (toll free, 24 hour hotline) call 911 if you are experiencing a  Mental Health or Behavioral Health Crisis or need someone to talk to.   Bing Edison MSN, RN RN Case Sales executive Health  VBCI-Population Health Office Hours M-F [phone] Direct Dial: [phone] Main Phone [phone]  Fax: [phone] [email or web address]
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
## 2023-11-24 NOTE — Transitions of Care (Post Inpatient/ED Visit) (Signed)
 Transition of Care week 3  Visit Note  11/24/2023  Name: Melissa Gaines MRN: [id number]          DOB: [birth year]  Situation: Patient enrolled in [facility] 30-day program. Visit completed with patient by telephone.   Background: Brief Hospital Course:  Melissa Gaines is a 53 y.o. female with history of antiphospholipid syndrome/PE, HLD, asthma, GERD who was admitted for small bowel obstruction.  Initial Transition Care Management Follow-up Telephone Call    Past Medical History:  Diagnosis Date   Anemia    Anxiety    Arthritis    Asthma    Cancer (HCC)    skin cancer   Chronic pain    Complication of anesthesia    Asthma attack during colonoscopy and EGD   Congenital hip dislocation    Depression    GERD (gastroesophageal reflux disease)    Hx pulmonary embolism 07/08/2017   Iron deficiency anemia 04/14/2018   Kidney mass 2017   Migraine    Osteoporosis    Pathological dislocation of shoulder joint, bilateral    congential   Pneumonia    as a child   PTSD (post-traumatic stress disorder)    Sleep apnea    Hx. of no CPAP   Systemic lupus erythematosus (HCC)    Tuberculosis    as a child was treated   Urticaria     Assessment: Patient Reported Symptoms: Cognitive Cognitive Status: No symptoms reported, Alert and oriented to person, place, and time, Insightful and able to interpret abstract concepts, Normal speech and language skills, Other: (Flat affect)      Neurological Neurological Review of Symptoms: Dizziness Neurological Management Strategies: Medication therapy, Coping strategies, Routine screening, Activity (OP PT at [facility] Neurological Self-Management Outcome: 3 (uncertain) Neurological Comment: Patient reports dizziness is improved. Completed appt. with neurologist on 11/17/23 for vestibular migraines and will start OP PT at [facility]  HEENT HEENT Symptoms Reported: Change or loss of hearing HEENT Management  Strategies: Coping strategies, Medical device, Medication therapy, Routine screening HEENT Self-Management Outcome: 4 (good) HEENT Comment: Hearing aids, vestibular migraines.    Cardiovascular Cardiovascular Symptoms Reported: No symptoms reported    Respiratory Respiratory Symptoms Reported: No symptoms reported    Endocrine Endocrine Symptoms Reported: No symptoms reported Is patient diabetic?: No    Gastrointestinal Gastrointestinal Symptoms Reported: Abdominal pain or discomfort Additional Gastrointestinal Details: Now chronic and ongoing. Has GI consult in November and am just dealing it as manageable. Gastrointestinal Management Strategies: Activity, Adequate rest, Medication therapy Gastrointestinal Self-Management Outcome: 2 (bad) Gastrointestinal Comment: Feels like pain is from hernia. SBO ruled out in recent hospitalization.    Genitourinary Genitourinary Symptoms Reported: Incontinence Additional Genitourinary Details: Chronic/Ongoing condition:Incontinence can occur in patients with Systemic Lupus Erythematosus (SLE) and/or Antiphospholipid Antibody Syndrome (APS). (SLE was only first noted in neurologist note on 11/17/23). Genitourinary Management Strategies: Incontinence garment/pad Genitourinary Self-Management Outcome: 4 (good)  Integumentary Integumentary Symptoms Reported: No symptoms reported    Musculoskeletal Musculoskelatal Symptoms Reviewed: Weakness Additional Musculoskeletal Details: Patient reports dislocated hips with metal plates per patient. History of SLE and Antiphopholipd syndrome Musculoskeletal Management Strategies: Activity, Coping strategies, Medical device, Medication therapy, Routine screening Musculoskeletal Self-Management Outcome: 3 (uncertain) Falls in the past year?: Yes (No falls reported this week per patient.) Number of falls in past year: 1 or less Was there an injury with Fall?: No Fall Risk Category Calculator: 1 Patient Fall Risk  Level: Low Fall Risk Patient at Risk for Falls Due to: Impaired mobility Fall  risk Follow up: Falls prevention discussed  Psychosocial Additional Psychological Details: Patient had a completed appointment with psychiatrist, she did not comment on this so did not break the glass. History of depression noted on PMH. Behavioral Management Strategies: Counseling, Medication therapy, Support system Major Change/Loss/Stressor/Fears (CP): Medical condition, self Techniques to Cope with Loss/Stress/Change: Counseling Do you feel physically threatened by others?: No   There were no vitals filed for this visit.  Medications Reviewed Today     Reviewed by Carolee Heron NOVAK, RN (Case Manager) on 11/24/23 at 1429  Med List Status: <None>   Medication Order Taking? Sig Documenting Provider Last Dose Status Informant  albuterol  (VENTOLIN  HFA) 108 (90 Base) MCG/ACT inhaler [id number] Yes Inhale 2 puffs into the lungs every 6 (six) hours as needed for wheezing or shortness of breath. Donzetta Rollene BRAVO, MD  Active Self, Pharmacy Records  atorvastatin  (LIPITOR) 80 MG tablet [id number] Yes Take 1 tablet (80 mg total) by mouth daily. Alba Sharper, MD  Active   azelastine  (ASTELIN ) 0.1 % nasal spray [id number] Yes Place 1-2 sprays into both nostrils 2 (two) times daily as needed (nasal drainage). Use in each nostril as directed Luke Orlan HERO, DO  Active   budesonide -formoterol  (SYMBICORT ) 160-4.5 MCG/ACT inhaler [id number] Yes Inhale 2 puffs into the lungs in the morning and at bedtime. with spacer and rinse mouth afterwards. Luke Orlan HERO, DO  Active   diclofenac  Sodium (VOLTAREN  ARTHRITIS PAIN) 1 % GEL [id number] Yes Apply 4 g topically 4 (four) times daily. Christia Budds, MD  Active Self, Pharmacy Records  ELIQUIS  5 MG TABS tablet [id number] Yes TAKE 1 TABLET BY MOUTH TWICE  DAILY NEEDS PCP APPOINTMENT  BEFORE MORE FILLS Christia Budds, MD  Active Self, Pharmacy Records           Med Note (COFFELL, JON HERO   Mon  Oct 31, 2023 10:01 AM)    EPINEPHrine  0.3 mg/0.3 mL IJ SOAJ injection [id number] Yes INJECT INTRAMUSCULARLY 1 PEN AS  NEEDED FOR ALLERGIC RESPONSE AS  DIRECTED BY MD. [facility]  [facility] AFTER USE. Cheryl Reusing, FNP  Active Self, Pharmacy Records           Med Note (SATTERFIELD, TEENA BRAVO Repress Oct 30, 2023  2:27 PM)    Erenumab -aooe (AIMOVIG ) 140 MG/ML EMMANUEL [id number] Yes Inject 140 mg into the skin every 30 (thirty) days. Skeet Juliene SAUNDERS, DO  Active Self, Pharmacy Records           Med Note (COFFELL, JON HERO   Mon Oct 31, 2023 10:13 AM) Last injection > 30 days, patient states she had several vaccines this month so she skipped this injection.  esomeprazole (NEXIUM) 40 MG capsule [id number] Yes Take 40 mg by mouth. [provider]  Active   ezetimibe  (ZETIA ) 10 MG tablet [id number] Yes TAKE 1 TABLET BY MOUTH DAILY Quillen, Michael, MD  Active   famotidine  (PEPCID ) 20 MG tablet [id number] Yes TAKE 1 TABLET BY MOUTH TWICE  DAILY Lorin Norris, MD  Active Self, Pharmacy Records  ferrous sulfate  325 (65 FE) MG tablet [id number] Yes Take 1 tablet (325 mg total) by mouth 3 (three) times daily with meals. Burton, Lacie K, NP  Active Self, Pharmacy Records           Med Note (COFFELL, JON HERO   Mon Oct 31, 2023 10:10 AM)    fluticasone  (FLONASE ) 50 MCG/ACT nasal spray [id number] Yes Place 1-2 sprays into both nostrils daily as needed (nasal congestion). Luke,  Orlan HERO, DO  Active   gabapentin  (NEURONTIN ) 300 MG capsule [id number] Yes Take 1 capsule (300 mg total) by mouth at bedtime. Arfeen, Syed T, MD  Active   levocetirizine (XYZAL ) 5 MG tablet [id number] Yes Take 1 tablet (5 mg total) by mouth every evening. Luke Orlan HERO, DO  Active   meclizine  (ANTIVERT ) 25 MG tablet [id number] Yes Take 1 tablet (25 mg total) by mouth 3 (three) times daily as needed for dizziness. Alba Sharper, MD  Active   ondansetron  (ZOFRAN -ODT) 4 MG disintegrating tablet [id number] Yes Take 1 tablet (4 mg total) by mouth every 8  (eight) hours as needed. Skeet Juliene SAUNDERS, DO  Active   pantoprazole  (PROTONIX ) 40 MG tablet [id number] Yes  [provider]  Active   RESTASIS  0.05 % ophthalmic emulsion [id number] Yes Apply 1 drop to eye. [provider]  Active   sucralfate (CARAFATE) 1 g tablet [id number] Yes Take 1 g by mouth. [provider]  Active   traZODone  (DESYREL ) 100 MG tablet [id number] Yes Take 1 tablet (100 mg total) by mouth at bedtime. Arfeen, Syed T, MD  Active   Ubrogepant  (UBRELVY ) 100 MG TABS [id number] Yes Take 1 tablet (100 mg total) by mouth as needed (May repeat in 2 hours.  Maximum 2 tablets in 24 hours). Skeet Juliene SAUNDERS, DO  Active   VITAMIN D  PO [id number] Yes Take 1 capsule by mouth every Thursday. [provider]  Active Self, Pharmacy Records           Med Note (COFFELL, JON HERO   Mon Oct 31, 2023 10:14 AM) Patient states she takes an OTC vitamin D  taken once a week - unable to confirm if D2 or D3, unable to confirm strength.             Goals Addressed             This Visit's Progress    VBCI Transitions of Care (TOC) Care Plan   On track    Reviewed on call with patient 11/24/23:   Problems:  Recent Hospitalization for treatment of Small Bowel Obstruction Knowledge Deficit Related to Small Bowel Obstruction SBO was ruled out, patient feels pain is from documented hernia/tortuous esophagus. Another GI consult in November 2025.  Chronic issues added 11/24/23:  Vestibular migraines: Completed neurologist follow up 11/17/23 PMH on neurology notes of SLE, some cervical issues, antiphospholipid syndrome, PTSD (in treatment).   Goal:  Over the next 30 days, the patient will not experience hospital readmission  Interventions:  Transitions of Care: Doctor Visits  - discussed the importance of doctor visits and new consult and completed follow ups.  Neurology follow up on 11/17/23 completed, to keep a migraine log, Patient reports dizziness has improved.   Neurologist per patient, feels it is from migraines.  To start OP Neuro-Rehab for vestibular issues.  Patient completed referral to GI consult for ongoing abdominal pain. New Meds given but patient stated made her dizzy and she had and ED visit on 11/14/23 with no admission and no diagnosis.  Another follow up for surgical evaluation in November 2025 Patient still has stated pain in abdomen of 8/10 and that nothing relieves it, but patient states it is manageable on 11/24/23.  Advised patient to call PCP for closer follow up appointment PCP HFU on 9/10:  Patient needs 3 days notice for appointments due to medicaid transportation Completed 11/10/23 post discharge follow up with surgeon: COMPLETED Encouraged patient to speak up about ongoing pain  level of 8 with ongoing abdominal pain.  Denies blood in stools, reports normal bowel movements.    Patient Self Care Activities:  Attend all scheduled provider appointments Call pharmacy for medication refills 3-7 days in advance of running out of medications Call provider office for new concerns or questions  Notify RN Care Manager of TOC call rescheduling needs Participate in Transition of Care Program/Attend TOC scheduled calls Perform all self care activities independently  Take medications as prescribed   Call your doctor now or seek immediate medical care if: You have a fever. You are vomiting. You have new or worse belly pain. You cannot pass stools or gas. Complete all follow up appointments with enough time to make transportation arrangements.  Declined BSW referral stating was given resources in hospital.  Patient states there is nothing really available besides her payor benefits.   Plan:  Telephone follow up appointment with care management team member scheduled for:  12/01/23 at 2 pm with Shona Prow RN CM         11/24/23: Patient with flat affect during call and seems resigned to current situation. Declines ambulatory  referrals but is in treatment for PTSD. Ongoing abdominal pain with another GI consult in November 2025. Call was short but completed with short yes/no answers per patient.   Recommendation:   Continue Current Plan of Care  Follow Up Plan:   Telephone follow-up in 1 weekTelephone follow up appointment with care management team member scheduled for:  12/01/23 at 2 pm with Shona Prow RN CM    Bing Edison MSN, RN RN Case Manager [facility]  [facility]-Population Health Office Hours M-F [phone] Direct Dial: [phone] Main Phone [phone]  Fax: [phone] [email or web address]

## 2023-11-29 ENCOUNTER — Other Ambulatory Visit: Payer: Self-pay

## 2023-11-29 DIAGNOSIS — D6861 Antiphospholipid syndrome: Secondary | ICD-10-CM

## 2023-11-29 MED ORDER — APIXABAN 5 MG PO TABS: 5.0000 mg | ORAL_TABLET | Freq: Two times a day (BID) | ORAL | 2 refills | Status: DC

## 2023-12-01 ENCOUNTER — Telehealth

## 2023-12-01 ENCOUNTER — Other Ambulatory Visit: Payer: Self-pay

## 2023-12-01 NOTE — Transitions of Care (Post Inpatient/ED Visit) (Signed)
  Transition of Care [facility] program closure  Visit Note  12/01/2023  Name: Melissa Gaines MRN: [id number]          DOB: 1970-03-12  Situation: Patient enrolled in [facility] 30-day program. Visit completed with patient by telephone.   Background: Admit/Discharge Date  8/24 - 8/27 Jolynn Pack Primary Diagnosis: Small bowel obstruction  Initial Transition Care Management Follow-up Telephone Call    Past Medical History:  Diagnosis Date   Anemia    Anxiety    Arthritis    Asthma    Cancer (HCC)    skin cancer   Chronic pain    Complication of anesthesia    Asthma attack during colonoscopy and EGD   Congenital hip dislocation    Depression    GERD (gastroesophageal reflux disease)    Hx pulmonary embolism 07/08/2017   Iron deficiency anemia 04/14/2018   Kidney mass 2017   Migraine    Osteoporosis    Pathological dislocation of shoulder joint, bilateral    congential   Pneumonia    as a child   PTSD (post-traumatic stress disorder)    Sleep apnea    Hx. of no CPAP   Systemic lupus erythematosus (HCC)    Tuberculosis    as a child was treated   Urticaria     Assessment:Unable to complete assessments Patient Reported Symptoms:  There were no vitals filed for this visit. Only partially reviewed medications. Patient stated she did not feel we needed to continue medication review and she was ready for [facility] program closure  Medications Reviewed Today   Medications were not reviewed in this encounter     Recommendation:   Continue to follow up with providers  Follow Up Plan:   Closing From:  Transitions of Care Program  Shona Prow RN, CCM [facility]  VBCI-Population Health RN Care Manager [phone]

## 2023-12-01 NOTE — Telephone Encounter (Signed)
 Discussed lab results with the patient on 12/01/2023 at 1300    Dexamethasone  suppression test with abnormal with serum cortisol >20   Will proceed with 24-hour urine collection for urinary cortisol   Instructions provided for to the patient as well as the address  Patient is scheduled for lab appointment to pick up urine containers on October 2   Patient advised to keep urine called during collection   Abby Redgie Butts, MD  [facility] Endocrinology  [facility] [address] [place], [place] [address] Phone: [phone] FAX: [phone]

## 2023-12-02 ENCOUNTER — Telehealth: Payer: Self-pay | Admitting: *Deleted

## 2023-12-02 NOTE — Telephone Encounter (Signed)
 Copied from CRM [id number]. Topic: Appointments - Appointment Info/Confirmation >> Dec 02, 2023  8:38 AM Miquel SAILOR wrote: Patient/patient representative is calling for information regarding an appointment.   Laverne from [employer]/[phone] confirming time 10/02 at 11 am No further questions

## 2023-12-07 ENCOUNTER — Ambulatory Visit: Attending: Family Medicine | Admitting: Physical Therapy

## 2023-12-07 ENCOUNTER — Encounter: Payer: Self-pay | Admitting: Physical Therapy

## 2023-12-07 DIAGNOSIS — R42 Dizziness and giddiness: Secondary | ICD-10-CM | POA: Insufficient documentation

## 2023-12-07 DIAGNOSIS — R293 Abnormal posture: Secondary | ICD-10-CM | POA: Diagnosis present

## 2023-12-07 DIAGNOSIS — G8929 Other chronic pain: Secondary | ICD-10-CM | POA: Diagnosis present

## 2023-12-07 DIAGNOSIS — M25561 Pain in right knee: Secondary | ICD-10-CM | POA: Insufficient documentation

## 2023-12-07 DIAGNOSIS — R2681 Unsteadiness on feet: Secondary | ICD-10-CM | POA: Diagnosis present

## 2023-12-07 DIAGNOSIS — M6281 Muscle weakness (generalized): Secondary | ICD-10-CM | POA: Insufficient documentation

## 2023-12-07 DIAGNOSIS — R2689 Other abnormalities of gait and mobility: Secondary | ICD-10-CM | POA: Insufficient documentation

## 2023-12-07 NOTE — Therapy (Signed)
 OUTPATIENT PHYSICAL THERAPY PROGRESS NOTE   Patient Name: Melissa Gaines MRN: [id number] DOB:1971/02/18, 53 y.o., female Today's Date: 12/07/2023  END OF SESSION:  PT End of Session - 12/07/23 0857     Visit Number 6    Number of Visits 13    Date for Recertification  11/10/23    PT Start Time 0830    PT Stop Time 0900    PT Time Calculation (min) 30 min    Activity Tolerance Patient tolerated treatment well    Behavior During Therapy [facility] for tasks assessed/performed               Past Medical History:  Diagnosis Date   Anemia    Anxiety    Arthritis    Asthma    Cancer (HCC)    skin cancer   Chronic pain    Complication of anesthesia    Asthma attack during colonoscopy and EGD   Congenital hip dislocation    Depression    GERD (gastroesophageal reflux disease)    Hx pulmonary embolism 07/08/2017   Iron deficiency anemia 04/14/2018   Kidney mass 2017   Migraine    Osteoporosis    Pathological dislocation of shoulder joint, bilateral    congential   Pneumonia    as a child   PTSD (post-traumatic stress disorder)    Sleep apnea    Hx. of no CPAP   Systemic lupus erythematosus (HCC)    Tuberculosis    as a child was treated   Urticaria    Past Surgical History:  Procedure Laterality Date   CESAREAN SECTION  2005   CESAREAN SECTION W/BTL  2010   COLONOSCOPY N/A 09/15/2023   Procedure: COLONOSCOPY;  Surgeon: Leigh Elspeth SQUIBB, MD;  Location: [facility];  Service: Gastroenterology;  Laterality: N/A;   ESOPHAGOGASTRODUODENOSCOPY N/A 09/15/2023   Procedure: EGD (ESOPHAGOGASTRODUODENOSCOPY);  Surgeon: Leigh Elspeth SQUIBB, MD;  Location: [facility];  Service: Gastroenterology;  Laterality: N/A;   HIP SURGERY     in childhood for congenital hip dislocation   LAPAROSCOPIC GASTRIC SLEEVE RESECTION  2014   POLYPECTOMY  09/15/2023   Procedure: POLYPECTOMY, INTESTINE;  Surgeon: Leigh Elspeth SQUIBB, MD;  Location: [facility];  Service:  Gastroenterology;;   HARLEY DILATION N/A 09/15/2023   Procedure: EGD, WITH DILATION USING SAVARY-GILLIARD DILATOR OVER GUIDEWIRE;  Surgeon: Leigh Elspeth SQUIBB, MD;  Location: [facility];  Service: Gastroenterology;  Laterality: N/A;   STAPEDECTOMY  11/01/2011   L postauricular stapedectomy with CO2 laser and insertion of 6 x 4.75 mm SMart Piston   TOTAL HIP ARTHROPLASTY     05/2000 R, 11/2000 L   Patient Active Problem List   Diagnosis Date Noted   Hiatal hernia 11/16/2023   Small bowel obstruction (HCC) 10/30/2023   Chronic health problem 10/30/2023   Dysphagia 09/15/2023   Benign neoplasm of colon 09/15/2023   B12 deficiency 05/11/2022   Hyperlipidemia 05/07/2022   Prediabetes 05/07/2022   Environmental and seasonal allergies 08/03/2021   History of colonoscopy 07/31/2021   Asthma 07/01/2021   Gastroesophageal reflux disease 08/19/2020   Tinnitus aurium, 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   Lumbar back pain 01/25/2020   Left hip pain 06/26/2019   Muscle strain of lower extremity, left, initial encounter 06/26/2019   Iron deficiency anemia 04/14/2018   Anemia 03/29/2018   Hematuria, gross 03/29/2018   Antiphospholipid antibody syndrome 01/25/2018   Morbid obesity (HCC) 07/26/2017   Systemic lupus erythematosus (HCC) 07/08/2017   Chronic pain of right knee 05/23/2017  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/21/2016   Vitamin D  deficiency 12/16/2016   Migraine     PCP: Alba Sharper, MD  REFERRING PROVIDER: Madelon Donald HERO, DO  REFERRING DIAG: Functional gait abnormality [R26.89], Chronic pain of right knee [M25.561, G89.29], Lumbar back pain [M54.50]   Rationale for Evaluation and Treatment: Rehabilitation  THERAPY DIAG:  Chronic pain of right knee  Other abnormalities of gait and mobility  Muscle weakness (generalized)  PERTINENT HISTORY: Speech impediment, anemia, lupus, tinnitus, asthma, prediabetes, vit D deficiency, knee surgery in early 2000s  WEIGHT BEARING RESTRICTIONS:  No  FALLS:  Has patient fallen in last 6 months? Yes. Number of falls 5, knee gave out and she lost her balance, multiple times on the stairs   LIVING ENVIRONMENT: Lives with: lives with their family Lives in: House/apartment Stairs: Yes: External: 12 steps; recently moved to the first floor of complex Has following equipment at home: Single point cane and Walker - 4 wheeled  OCCUPATION: on disability   PRECAUTIONS: None ---------------------------------------------------------------------------------------------  SUBJECTIVE:                                                                                                                                                                                           SUBJECTIVE STATEMENT:  Pt attended mentioning that her original aches and pains in the knee and back are overall manageable. Occasionally she has to lay/sit down to rest, but no pain is keeping her down, she's trying to walk more without AD. Newest c/c is dizziness, headaches, balance deficits and vertigo symptoms leading to LOB.   Eval statement 09/29/2023: Has been having pain for many years, pain has slowly gotten worse, recently in past 2 months has fallen 5 times because her R knee has buckled and she lost her balance. Started using a FWW to ambulate and has not had falls since, however has dropped the walker a few times, and does not always use it for short distances. Pain: 9/10 in R knee, feel like an electrocution or numbness, or locking feeling.  RED FLAGS: None    PLOF: Independent  PATIENT GOALS: stop the knee from giving away  NEXT MD VISIT: need to schedule ---------------------------------------------------------------------------------------------  OBJECTIVE:  Note: Objective measures were completed at Evaluation unless otherwise noted.  DIAGNOSTIC FINDINGS:  No pertinent imaging in chart  PATIENT SURVEYS:  LEFS  Extreme difficulty/unable (0), Quite a  bit of difficulty (1), Moderate difficulty (2), Little difficulty (3), No difficulty (4) Survey date:  09/29/2023 12/07/2023  Any of your usual work, housework or school activities    2. Usual hobbies, recreational or sporting activities    3. Getting into/out  of the bath    4. Walking between rooms    5. Putting on socks/shoes    6. Squatting     7. Lifting an object, like a bag of groceries from the floor    8. Performing light activities around your home    9. Performing heavy activities around your home    10. Getting into/out of a car    11. Walking 2 blocks    12. Walking 1 mile    13. Going up/down 10 stairs (1 flight)    14. Standing for 1 hour    15.  sitting for 1 hour    16. Running on even ground    17. Running on uneven ground    18. Making sharp turns while running fast    19. Hopping     20. Rolling over in bed    Score total:  7/80 19/80     COGNITION: Overall cognitive status: Within functional limits for tasks assessed   PALPATION: Tenderness around R knee  SENSATION: Light touch: Impaired  around RLE  MUSCLE LENGTH: Hamstrings:B WFL  Thomas test: Right Limited R gastroc Limited  POSTURE: rounded shoulders and forward head   LUMBAR ROM:   AROM eval  Flexion   Extension   Right lateral flexion   Left lateral flexion   Right rotation   Left rotation    (Blank rows = not tested)  ! Indicates pain with testing  LOWER EXTREMITY ROM:     Active  Right eval Left eval  Hip flexion    Hip extension    Hip abduction    Hip adduction    Hip internal rotation    Hip external rotation    Knee flexion    Knee extension    Ankle dorsiflexion    Ankle plantarflexion    Ankle inversion    Ankle eversion     (Blank rows = not tested)  ! Indicates pain with testing  LOWER EXTREMITY MMT:    MMT Right eval Left eval  Hip flexion 100 WFL  Hip extension [facility]  Hip abduction    Hip adduction    Hip internal rotation    Hip external  rotation    Knee flexion 110 WFL  Knee extension 0 8  Ankle dorsiflexion 10 10  Ankle plantarflexion    Ankle inversion    Ankle eversion     (Blank rows = not tested)   ! Indicates pain with testing LUMBAR SPECIAL TESTS:  Prone instability test: Positive  FUNCTIONAL TESTS:  30 seconds chair stand test 10  GAIT: Distance walked: 165ft Assistive device utilized: Walker - 4 wheeled Level of assistance: Modified independence Comments: L WS, R trendelenburg, reduced RLE quad recruitment in stance  [facility] Adult PT Treatment:                                                DATE: 12/07/2023  Therapeutic Activity: Objective measures Goal assessment, functional testing  Self Care: POC discussion Pt education   [facility] Adult PT Treatment:                                                DATE: 10/26/2023 Therapeutic  Activity: NuStep 8' for activity tolerance Supine Resisted marching 2x12B, hold 1s, RTB Supine SLR  2x12, hold 5s, RLE STS staggered stance 2x12, RLE bias Bent knee fall out 2x12 B, 2s hold, GTB  [facility] Adult PT Treatment:                                                DATE: 10/18/2023  Therapeutic Exercise: Gastroc stretch on slant board 2x1' Runners step on 4 box 2x8 B, UE a. PRN Therapeutic Activity: NuStep 8' for activity tolerance Resisted marching 2x12B, hold 1s, RTB Slant board heel raise  2x15, hold 3s TKE on RLE w/ball at wall 2x10, hold 8s  PATIENT EDUCATION:  Education details: Pt received education regarding HEP performance, ADL performance, functional activity tolerance, impairment education, appropriate performance of therapeutic activities. DKE use Person educated: Patient Education method: Explanation, Demonstration, Tactile cues, Verbal cues, and Handouts Education comprehension: verbalized understanding and returned demonstration  HOME EXERCISE PROGRAM: Access Code: [email or web address] URL: [email or web address] Date:  09/29/2023 Prepared by: Mabel Kiang  Exercises - Sit to Stand Without Arm Support  - 1 x daily - 4 x weekly - 2-3 sets - 15 reps - Sidelying Hip Abduction  - 1 x daily - 4 x weekly - 2-3 sets - 10 reps - 3s hold - Supine March  - 1 x daily - 7 x weekly - 2-3 sets - 10 reps - Active Straight Leg Raise with Quad Set  - 1 x daily - 4 x weekly - 2-3 sets - 15 reps - 3s hold ---------------------------------------------------------------------------------------------  ASSESSMENT:  CLINICAL IMPRESSION:  Pt attended physical therapy session for re-evaluation of R knee and LBP. Pt has met  3 goals and continues to work towards 2 others. Difficulties continue with functional movement patterns and self percieved function. Pt ascertains these deficits to be more related to onset of new c/c being vertigo and severe headaches more than any aches or pains. With pts recent ED visits, pt received a new referral for treatment of vertigo/dizziness, and pt reports having balance deficits alongside this. As pt's current POC is related to MSK deficits and primary c/c is vestibular/ neurological based, it is recommended pt be d/c from OPPT and scheduled using new referral with neuro clinic for best pt outcomes. Pt required minimal cuing as well as stand by assistance for safe and appropriate performance of today's activities.   Eval impression (09/29/2023): Pt. attended today's physical therapy session for evaluation of gait and R knee pain. Pt has complaints of R knee pain and buckling causing. Pt has notable deficits and would benefit from therapeutic focus on knee AROM, strength, muscle contraction, muscle length, and gait quality.Treatment performed today focused on pt education detailed in the objective. Pt demonstrated great understanding of education provided. required moderate cues and supervisory assistance for appropriate performance with today's activities. Pt requires the intervention of skilled outpatient  physical therapy to address the aforementioned deficits and progress towards a functional level in line with therapeutic goals.    OBJECTIVE IMPAIRMENTS: Abnormal gait, decreased mobility, difficulty walking, decreased ROM, decreased strength, improper body mechanics, postural dysfunction, and pain.   ACTIVITY LIMITATIONS: standing, squatting, sleeping, stairs, dressing, self feeding, and locomotion level  PARTICIPATION LIMITATIONS: meal prep, cleaning, shopping, and community activity  PERSONAL FACTORS: Behavior pattern, Fitness, Time since onset of injury/illness/exacerbation, Transportation, and 3+ comorbidities: see pmh  are also affecting patient's functional outcome.   REHAB POTENTIAL: Fair personal factors  CLINICAL DECISION MAKING: Stable/uncomplicated  EVALUATION COMPLEXITY: Low   GOALS: Goals reviewed with patient? YES  SHORT TERM GOALS: Target date: 10/20/2023  Pt will be independent with administered HEP to demonstrate the competency necessary for long term managemnet of symptoms at home. Baseline: Goal status: MET 12/07/2023   LONG TERM GOALS: Target date: 11/10/2023  Pt. Will achieve a LEFS score of 20/80 as to demonstrate improvement in self-perceived functional ability with daily activities.  Baseline: 7/80 Goal status: ONGOING 12/07/2023  2.  Pt will improve Global hip strength to a 4+/5 to demonstrate improvement in strength for quality of motion and activity performance.  Baseline:  Goal status: MET 12/07/2023  3.   Pt will independently ambulate 10 minutes with LRAD and reduced R sided trendelenburg to demonstrate improved weightbearing tolerance, BLE strength, and functional capacity for community ambulation.  Baseline:  Goal status: MET 12/07/2023  4.  Pt will improve 30s STS to 12 to demonstrate improved BLE strength, coordination, and functional transfer ability necessary for daily life Baseline: 8 12/07/2023: 5 (limited d/t dizziness) Goal status:  ONGOING ---------------------------------------------------------------------------------------------  PLAN:  PT FREQUENCY: 1-2x/week  PT DURATION: 6 weeks  PLANNED INTERVENTIONS: 97110-Therapeutic exercises, 97530- Therapeutic activity, [id number]- Neuromuscular re-education, 97535- Self Care, [id number]- Manual therapy, [phone]- Gait training, [phone]- Aquatic Therapy, Patient/Family education, Balance training, Stair training, Taping, Joint mobilization, and Spinal mobilization.  PLAN FOR NEXT SESSION: Continue to progress as tolerated within current POC focus.   PHYSICAL THERAPY DISCHARGE SUMMARY  Visits from Start of Care: 6  Current functional level related to goals / functional outcomes: See assessment   Remaining deficits: See assessment   Education / Equipment: See assessment   Patient agrees to discharge. Patient goals were partially met. Patient is being discharged due to change in status and c/c.    Mabel Kiang, PT, DPT 12/07/2023, 9:00 AM

## 2023-12-08 ENCOUNTER — Other Ambulatory Visit

## 2023-12-12 ENCOUNTER — Other Ambulatory Visit

## 2023-12-12 DIAGNOSIS — D352 Benign neoplasm of pituitary gland: Secondary | ICD-10-CM | POA: Diagnosis not present

## 2023-12-14 ENCOUNTER — Encounter: Payer: Self-pay | Admitting: Physical Therapy

## 2023-12-14 ENCOUNTER — Ambulatory Visit: Admitting: Physical Therapy

## 2023-12-14 VITALS — BP 106/67 | HR 78

## 2023-12-14 DIAGNOSIS — R42 Dizziness and giddiness: Secondary | ICD-10-CM

## 2023-12-14 DIAGNOSIS — R2681 Unsteadiness on feet: Secondary | ICD-10-CM

## 2023-12-14 DIAGNOSIS — M25561 Pain in right knee: Secondary | ICD-10-CM | POA: Diagnosis not present

## 2023-12-14 NOTE — Therapy (Signed)
 OUTPATIENT PHYSICAL THERAPY VESTIBULAR EVALUATION     Patient Name: Melissa Gaines MRN: [id number] DOB:24-Sep-1970, 53 y.o., female Today's Date: 12/14/2023  END OF SESSION:  PT End of Session - 12/14/23 0849     Visit Number 1    Number of Visits 9    Date for Recertification  01/13/24    Authorization Type [employer]    PT Start Time [phone]    PT Stop Time 0928    PT Time Calculation (min) 41 min    Activity Tolerance Patient tolerated treatment well    Behavior During Therapy [facility] for tasks assessed/performed          Past Medical History:  Diagnosis Date   Anemia    Anxiety    Arthritis    Asthma    Cancer (HCC)    skin cancer   Chronic pain    Complication of anesthesia    Asthma attack during colonoscopy and EGD   Congenital hip dislocation    Depression    GERD (gastroesophageal reflux disease)    Hx pulmonary embolism 07/08/2017   Iron deficiency anemia 04/14/2018   Kidney mass 2017   Migraine    Osteoporosis    Pathological dislocation of shoulder joint, bilateral    congential   Pneumonia    as a child   PTSD (post-traumatic stress disorder)    Sleep apnea    Hx. of no CPAP   Systemic lupus erythematosus (HCC)    Tuberculosis    as a child was treated   Urticaria    Past Surgical History:  Procedure Laterality Date   CESAREAN SECTION  2005   CESAREAN SECTION W/BTL  2010   COLONOSCOPY N/A 09/15/2023   Procedure: COLONOSCOPY;  Surgeon: Leigh Elspeth SQUIBB, MD;  Location: [facility];  Service: Gastroenterology;  Laterality: N/A;   ESOPHAGOGASTRODUODENOSCOPY N/A 09/15/2023   Procedure: EGD (ESOPHAGOGASTRODUODENOSCOPY);  Surgeon: Leigh Elspeth SQUIBB, MD;  Location: [facility];  Service: Gastroenterology;  Laterality: N/A;   HIP SURGERY     in childhood for congenital hip dislocation   LAPAROSCOPIC GASTRIC SLEEVE RESECTION  2014   POLYPECTOMY  09/15/2023   Procedure: POLYPECTOMY, INTESTINE;  Surgeon: Leigh Elspeth SQUIBB, MD;  Location: [facility];  Service: Gastroenterology;;   HARLEY DILATION N/A 09/15/2023   Procedure: EGD, WITH DILATION USING SAVARY-GILLIARD DILATOR OVER GUIDEWIRE;  Surgeon: Leigh Elspeth SQUIBB, MD;  Location: [facility];  Service: Gastroenterology;  Laterality: N/A;   STAPEDECTOMY  11/01/2011   L postauricular stapedectomy with CO2 laser and insertion of 6 x 4.75 mm SMart Piston   TOTAL HIP ARTHROPLASTY     05/2000 R, 11/2000 L   Patient Active Problem List   Diagnosis Date Noted   Hiatal hernia 11/16/2023   Small bowel obstruction (HCC) 10/30/2023   Chronic health problem 10/30/2023   Dysphagia 09/15/2023   Benign neoplasm of colon 09/15/2023   B12 deficiency 05/11/2022   Hyperlipidemia 05/07/2022   Prediabetes 05/07/2022   Environmental and seasonal allergies 08/03/2021   History of colonoscopy 07/31/2021   Asthma 07/01/2021   Gastroesophageal reflux disease 08/19/2020   Tinnitus aurium, 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   Lumbar back pain 01/25/2020   Left hip pain 06/26/2019   Muscle strain of lower extremity, left, initial encounter 06/26/2019   Iron deficiency anemia 04/14/2018   Anemia 03/29/2018   Hematuria, gross 03/29/2018   Antiphospholipid antibody syndrome 01/25/2018   Morbid obesity (HCC) 07/26/2017   Systemic lupus erythematosus (HCC) 07/08/2017   Chronic pain of  right knee 05/23/2017   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/21/2016   Vitamin D  deficiency 12/16/2016   Migraine     PCP: McDiarmid, Krystal BIRCH, MD REFERRING PROVIDER: McDiarmid, Krystal BIRCH, MD  REFERRING DIAG: R42 (ICD-10-CM) - Dizziness  THERAPY DIAG:  Dizziness and giddiness  Unsteadiness on feet  ONSET DATE: 11/16/2023  Rationale for Evaluation and Treatment: Rehabilitation  SUBJECTIVE:   SUBJECTIVE STATEMENT: Mostly has dizziness at night time when going to sleep. When she goes to sleep the room starts to move. Mostly vertigo comes with her allergies. Sometimes bright light will trigger her vertigo and that  also triggers her migraines. Is currently back on her migraine medication - reports that helps her with the room spinning vertigo. Has headaches, but they are not as strong as she used to get them. Dizziness has been going on for 2-3 months. Was previously seen at [address] for her knee and since then she has not had any falls. Has a rollator with a seat, uses it when has to be out for longer times when there are no chairs.   Pt accompanied by: self  PERTINENT HISTORY: PMH: Left sided occipital neuralgia, vestibular migraine, anxiety, anemia, osteoporosis, PTSD, chronic dizziness, SLE and antiphospholipid antibody syndrome   Per PCP: Her surgeon took her off of Ubrelvy  due to the nausea.  She hasn't been taking anything for migraines. Frequency:  2-3 days a month. Dizziness occurs intermittent, usually 2 a month but not with the headache.  She did have a headache with vertigo on 11/14/2023 for which she was seen in the ED.  She still has it.    PAIN:  Are you having pain? No and Sinuses are bothering her right now. Takes allergy  medication and that helps sometimes   PRECAUTIONS: Fall   FALLS: Has patient fallen in last 6 months? Yes. Number of falls 3, reports hurt her L knee but then got better with therapy   LIVING ENVIRONMENT: Lives with: lives with her teenage children  Lives in: House/apartment Stairs: Yes: External: 5 or 6 steps; bilateral but cannot reach both Has following equipment at home: Single point cane, Walker - 4 wheeled, and has a cane, but not feel as supportive and feels like she might fall   PLOF: Independent with household mobility without device, Independent with community mobility with device, and Needs assistance with homemaking Can't bend down to put socks on, pt reports due to dislocated hips and dizziness  Has a home aide that helps with cleaning, cooking, getting dressed from the waist down, aide comes daily   PATIENT GOALS: Wants to improve her balance, have  more control of her legs, doesn't want to fall   OBJECTIVE:  Note: Objective measures were completed at Evaluation unless otherwise noted.  DIAGNOSTIC FINDINGS: MRI of cervical spine on 05/30/2023 which revealed small right paracentral disc protrusions at C4-5 and C6-7 and additional mild noncompressive disc bulging at C3-4 and C5-6 without stenosis or impingement.   MRI brain 11/21/23: IMPRESSION: Stable 4 x 5 mm focus of nodular enhancing tissue within the infundibulum with a diminutive infundibulum beyond that. 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 Stability since 2022 ensures a benign nature. Craniopharyngioma is essentially excluded. Infundibular metastasis is excluded.  COGNITION: Overall cognitive status: Within functional limits for tasks assessed    Cervical ROM:   pt very guarded with cervical movements    GAIT: Gait pattern: decreased stance time- Right, decreased stride length, antalgic, trendelenburg, lateral lean- Right,  and trunk flexed Distance walked: Clinic distances  Assistive device utilized: None Level of assistance: Modified independence   VESTIBULAR ASSESSMENT:  GENERAL OBSERVATION: Ambulates in with no AD, antalgic gait pattern with trunk lean to R   SYMPTOM BEHAVIOR:  Subjective history: See above   Non-Vestibular symptoms: changes in vision, headaches, migraine symptoms, and pt reporting has blurred vision and has seen an eye doctor and reports she has cataracts, does not wear glasses as she feels it makes it worse   Type of dizziness: Imbalance (Disequilibrium) and Spinning/Vertigo  Frequency: Comes and goes, feels like episodes are worse when her allergies are worse   Duration: ~5 minutes if she has episodes   Aggravating factors: Induced by motion: turning head quickly and sitting up and down, being in a car for too long and head will start to spin   Relieving factors: dark room, closing  eyes, and putting cold water on her face   Progression of symptoms: better  OCULOMOTOR EXAM:  Ocular Alignment: normal  Ocular ROM: No Limitations  Spontaneous Nystagmus: absent  Gaze-Induced Nystagmus: absent  Smooth Pursuits: intact and reports this bothered her eyes, more blinking, some jumping going through midline   Saccades: extra eye movements and ~2 saccadic beats in horizontal direction, incr dizziness when going back to the center, ~2 saccadic beats in vertical direction, did not cause dizziness  Notes brighter lights in tx room was triggering for a headache, so turned down one of the bright lights    VESTIBULAR - OCULAR REFLEX:   Slow VOR: pt very guarded, does not let PT move her head even slowly, PT tried multiple times, when asked to move her head pt can move it [facility] and reports it makes her dizzy   VOR Cancellation: unable to test due to guarding   Head-Impulse Test: unable to test due to guarding   MOTION SENSITIVITY:  Motion Sensitivity Quotient Intensity: 0 = none, 1 = Lightheaded, 2 = Mild, 3 = Moderate, 4 = Severe, 5 = Vomiting  Intensity  1. Sitting to supine   2. Supine to L side   3. Supine to R side   4. Supine to sitting   5. L Hallpike-Dix   6. Up from L    7. R Hallpike-Dix   8. Up from R    9. Sitting, head tipped to L knee 3-4  10. Head up from L knee 0  11. Sitting, head tipped to R knee 3-4  12. Head up from R knee 0  13. Sitting head turns x5 1-2 (reports if she had to do more head turns, would be unable to do it)  14.Sitting head nods x5 2-3 (felt like it was going to get worse the more she did)  15. In stance, 180 turn to L  5 (not vomiting)  16. In stance, 180 turn to R Pt sat down immediately after turning to the L and unable to try going the other direction  TREATMENT DATE: 12/14/23  Self-Care:  Clinical findings,  POC, following up with eye doctor in regards to blurred vision, PT's role with dizziness in regards to vestibular migraine    PATIENT EDUCATION: Education details: See self-care  Person educated: Patient Education method: Explanation Education comprehension: verbalized understanding and needs further education  HOME EXERCISE PROGRAM: Will provide at future session   GOALS: Goals reviewed with patient? Yes  SHORT TERM GOALS: ALL STGS = LTGS   LONG TERM GOALS: Target date: 01/11/2024  Pt will be independent with final HEP in regards to balance/vestibular deficits.  Baseline:  Goal status: INITIAL  2.  Pt will report items on MSQ as 2/5 or less in order to demo decr motion sensitivity.  Baseline: see chart on 10/8 Goal status: INITIAL  3.  mCTSIB to be assessed with LTG written.  Baseline:  Goal status: INITIAL  4.  DHI to be assessed with LTG written.  Baseline:  Goal status: INITIAL   ASSESSMENT:  CLINICAL IMPRESSION: Patient is a 53 year old female referred to Neuro OPPT for dizziness.   Pt's PMH is significant for: Left sided occipital neuralgia, vestibular migraine, anxiety, anemia, osteoporosis, PTSD, chronic dizziness, SLE and antiphospholipid antibody syndrome. The following deficits were present during the exam: gait abnormalities, impaired balance, dizziness with smooth pursuits, saccades, motion sensitivity based on MSQ, light sensitivity, postural abnormalities, decr activity tolerance. Will finish further vestibular/balance assessment at next session. Pt would benefit from skilled PT to address these impairments and functional limitations to maximize functional mobility independence and decr dizziness.    OBJECTIVE IMPAIRMENTS: Abnormal gait, decreased activity tolerance, decreased balance, decreased endurance, decreased mobility, difficulty walking, decreased ROM, dizziness, postural dysfunction, and pain.   ACTIVITY LIMITATIONS: lifting, bending, squatting,  stairs, dressing, hygiene/grooming, locomotion level, and caring for others  PARTICIPATION LIMITATIONS: meal prep, cleaning, laundry, driving, and community activity  PERSONAL FACTORS: Behavior pattern, Past/current experiences, Time since onset of injury/illness/exacerbation, and 3+ comorbidities: Left sided occipital neuralgia, vestibular migraine, anxiety, anemia, osteoporosis, PTSD, chronic dizziness, SLE and antiphospholipid antibody syndrome  are also affecting patient's functional outcome.   REHAB POTENTIAL: Good  CLINICAL DECISION MAKING: Evolving/moderate complexity  EVALUATION COMPLEXITY: Moderate   PLAN:  PT FREQUENCY: 2x/week  PT DURATION: 4 weeks  PLANNED INTERVENTIONS: 97164- PT Re-evaluation, 97110-Therapeutic exercises, 97530- Therapeutic activity, 97112- Neuromuscular re-education, 97535- Self Care, [id number]- Manual therapy, [phone]- Gait training, [phone]- Canalith repositioning, Patient/Family education, Balance training, Stair training, Vestibular training, and DME instructions  PLAN FOR NEXT SESSION: finish MSQ testing/look at positional testing, do mCTSIB and have pt fill out DHI and write updated goals Initiate HEP based on findings, and for head movements/slow VOR, saccades    [place] LOISE Senate, PT, DPT 12/14/2023, 11:46 AM

## 2023-12-16 ENCOUNTER — Ambulatory Visit (INDEPENDENT_AMBULATORY_CARE_PROVIDER_SITE_OTHER)

## 2023-12-16 DIAGNOSIS — J309 Allergic rhinitis, unspecified: Secondary | ICD-10-CM

## 2023-12-16 LAB — CORTISOL, URINE, 24 HOUR
24 Hour urine volume (VMAHVA): 1250 mL
CREATININE, URINE: 1.28 g/(24.h) (ref 0.50–2.15)
Cortisol (Ur), Free: 46.3 ug/(24.h) (ref 4.0–50.0)

## 2023-12-19 ENCOUNTER — Ambulatory Visit

## 2023-12-19 VITALS — BP 95/73 | HR 71

## 2023-12-19 DIAGNOSIS — R2681 Unsteadiness on feet: Secondary | ICD-10-CM

## 2023-12-19 DIAGNOSIS — R2689 Other abnormalities of gait and mobility: Secondary | ICD-10-CM

## 2023-12-19 DIAGNOSIS — R42 Dizziness and giddiness: Secondary | ICD-10-CM

## 2023-12-19 DIAGNOSIS — R293 Abnormal posture: Secondary | ICD-10-CM

## 2023-12-19 DIAGNOSIS — M25561 Pain in right knee: Secondary | ICD-10-CM | POA: Diagnosis not present

## 2023-12-19 NOTE — Therapy (Signed)
 OUTPATIENT PHYSICAL THERAPY VESTIBULAR TREATMENT     Patient Name: Melissa Gaines MRN: [id number] DOB:[birth year], 53 y.o., female Today's Date: 12/19/2023  END OF SESSION:  PT End of Session - 12/19/23 0912     Visit Number 2    Number of Visits 9    Date for Recertification  01/13/24    Authorization Type [employer]    PT Start Time [phone]    PT Stop Time 1006    PT Time Calculation (min) 41 min    Equipment Utilized During Treatment Gait belt    Activity Tolerance Patient tolerated treatment well;No increased pain    Behavior During Therapy WFL for tasks assessed/performed           Past Medical History:  Diagnosis Date   Anemia    Anxiety    Arthritis    Asthma    Cancer (HCC)    skin cancer   Chronic pain    Complication of anesthesia    Asthma attack during colonoscopy and EGD   Congenital hip dislocation    Depression    GERD (gastroesophageal reflux disease)    Hx pulmonary embolism 07/08/2017   Iron deficiency anemia 04/14/2018   Kidney mass 2017   Migraine    Osteoporosis    Pathological dislocation of shoulder joint, bilateral    congential   Pneumonia    as a child   PTSD (post-traumatic stress disorder)    Sleep apnea    Hx. of no CPAP   Systemic lupus erythematosus (HCC)    Tuberculosis    as a child was treated   Urticaria    Past Surgical History:  Procedure Laterality Date   CESAREAN SECTION  2005   CESAREAN SECTION W/BTL  2010   COLONOSCOPY N/A 09/15/2023   Procedure: COLONOSCOPY;  Surgeon: Leigh Elspeth SQUIBB, MD;  Location: [facility];  Service: Gastroenterology;  Laterality: N/A;   ESOPHAGOGASTRODUODENOSCOPY N/A 09/15/2023   Procedure: EGD (ESOPHAGOGASTRODUODENOSCOPY);  Surgeon: Leigh Elspeth SQUIBB, MD;  Location: [facility];  Service: Gastroenterology;  Laterality: N/A;   HIP SURGERY     in childhood for congenital hip dislocation   LAPAROSCOPIC GASTRIC SLEEVE RESECTION  2014   POLYPECTOMY   09/15/2023   Procedure: POLYPECTOMY, INTESTINE;  Surgeon: Leigh Elspeth SQUIBB, MD;  Location: [facility];  Service: Gastroenterology;;   HARLEY DILATION N/A 09/15/2023   Procedure: EGD, WITH DILATION USING SAVARY-GILLIARD DILATOR OVER GUIDEWIRE;  Surgeon: Leigh Elspeth SQUIBB, MD;  Location: [facility];  Service: Gastroenterology;  Laterality: N/A;   STAPEDECTOMY  11/01/2011   L postauricular stapedectomy with CO2 laser and insertion of 6 x 4.75 mm SMart Piston   TOTAL HIP ARTHROPLASTY     05/2000 R, 11/2000 L   Patient Active Problem List   Diagnosis Date Noted   Hiatal hernia 11/16/2023   Small bowel obstruction (HCC) 10/30/2023   Chronic health problem 10/30/2023   Dysphagia 09/15/2023   Benign neoplasm of colon 09/15/2023   B12 deficiency 05/11/2022   Hyperlipidemia 05/07/2022   Prediabetes 05/07/2022   Environmental and seasonal allergies 08/03/2021   History of colonoscopy 07/31/2021   Asthma 07/01/2021   Gastroesophageal reflux disease 08/19/2020   Tinnitus aurium, 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   Lumbar back pain 01/25/2020   Left hip pain 06/26/2019   Muscle strain of lower extremity, left, initial encounter 06/26/2019   Iron deficiency anemia 04/14/2018   Anemia 03/29/2018   Hematuria, gross 03/29/2018   Antiphospholipid antibody syndrome 01/25/2018   Morbid obesity (HCC) 07/26/2017  Systemic lupus erythematosus (HCC) 07/08/2017   Chronic pain of right knee 05/23/2017   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/21/2016   Vitamin D  deficiency 12/16/2016   Migraine     PCP: McDiarmid, Krystal BIRCH, MD REFERRING PROVIDER: McDiarmid, Krystal BIRCH, MD  REFERRING DIAG: R42 (ICD-10-CM) - Dizziness  THERAPY DIAG:  Dizziness and giddiness  Unsteadiness on feet  Other abnormalities of gait and mobility  Abnormal posture  ONSET DATE: 11/16/2023  Rationale for Evaluation and Treatment: Rehabilitation  SUBJECTIVE:   SUBJECTIVE STATEMENT: Patient states she feels the migraine medication is helping her to  not get too dizzy. Patient states their weekend was good and that they have mild dizziness, about a 2-3/5 but they can do the stuff they need to do these days at home. Patient states they are having a decent day today.  Pt accompanied by: self  PERTINENT HISTORY: PMH: Left sided occipital neuralgia, vestibular migraine, anxiety, anemia, osteoporosis, PTSD, chronic dizziness, SLE and antiphospholipid antibody syndrome   Per PCP: Her surgeon took her off of Ubrelvy  due to the nausea.  She hasn't been taking anything for migraines. Frequency:  2-3 days a month. Dizziness occurs intermittent, usually 2 a month but not with the headache.  She did have a headache with vertigo on 11/14/2023 for which she was seen in the ED.  She still has it.    PAIN:  Are you having pain? No and Sinuses are bothering her right now. Takes allergy  medication and that helps sometimes   PRECAUTIONS: Fall   FALLS: Has patient fallen in last 6 months? Yes. Number of falls 3, reports hurt her L knee but then got better with therapy   LIVING ENVIRONMENT: Lives with: lives with her teenage children  Lives in: House/apartment Stairs: Yes: External: 5 or 6 steps; bilateral but cannot reach both Has following equipment at home: Single point cane, Walker - 4 wheeled, and has a cane, but not feel as supportive and feels like she might fall   PLOF: Independent with household mobility without device, Independent with community mobility with device, and Needs assistance with homemaking Can't bend down to put socks on, pt reports due to dislocated hips and dizziness  Has a home aide that helps with cleaning, cooking, getting dressed from the waist down, aide comes daily   PATIENT GOALS: Wants to improve her balance, have more control of her legs, doesn't want to fall   OBJECTIVE:  Note: Objective measures were completed at Evaluation unless otherwise noted.  DIAGNOSTIC FINDINGS: MRI of cervical spine on 05/30/2023 which  revealed small right paracentral disc protrusions at C4-5 and C6-7 and additional mild noncompressive disc bulging at C3-4 and C5-6 without stenosis or impingement.   MRI brain 11/21/23: IMPRESSION: Stable 4 x 5 mm focus of nodular enhancing tissue within the infundibulum with a diminutive infundibulum beyond that. 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 Stability since 2022 ensures a benign nature. Craniopharyngioma is essentially excluded. Infundibular metastasis is excluded.  COGNITION: Overall cognitive status: Within functional limits for tasks assessed    Cervical ROM:   pt very guarded with cervical movements    GAIT: Gait pattern: decreased stance time- Right, decreased stride length, antalgic, trendelenburg, lateral lean- Right, and trunk flexed Distance walked: Clinic distances  Assistive device utilized: None Level of assistance: Modified independence   VESTIBULAR ASSESSMENT:  GENERAL OBSERVATION: Ambulates in with no AD, antalgic gait pattern with trunk lean to R   SYMPTOM BEHAVIOR:  Subjective history: See  above   Non-Vestibular symptoms: changes in vision, headaches, migraine symptoms, and pt reporting has blurred vision and has seen an eye doctor and reports she has cataracts, does not wear glasses as she feels it makes it worse   Type of dizziness: Imbalance (Disequilibrium) and Spinning/Vertigo  Frequency: Comes and goes, feels like episodes are worse when her allergies are worse   Duration: ~5 minutes if she has episodes   Aggravating factors: Induced by motion: turning head quickly and sitting up and down, being in a car for too long and head will start to spin   Relieving factors: dark room, closing eyes, and putting cold water on her face   Progression of symptoms: better  OCULOMOTOR EXAM:  Ocular Alignment: normal  Ocular ROM: No Limitations  Spontaneous Nystagmus: absent  Gaze-Induced  Nystagmus: absent  Smooth Pursuits: intact and reports this bothered her eyes, more blinking, some jumping going through midline   Saccades: extra eye movements and ~2 saccadic beats in horizontal direction, incr dizziness when going back to the center, ~2 saccadic beats in vertical direction, did not cause dizziness  Notes brighter lights in tx room was triggering for a headache, so turned down one of the bright lights    VESTIBULAR - OCULAR REFLEX:   Slow VOR: pt very guarded, does not let PT move her head even slowly, PT tried multiple times, when asked to move her head pt can move it [facility] and reports it makes her dizzy   VOR Cancellation: unable to test due to guarding   Head-Impulse Test: unable to test due to guarding   MOTION SENSITIVITY:  Motion Sensitivity Quotient Intensity: 0 = none, 1 = Lightheaded, 2 = Mild, 3 = Moderate, 4 = Severe, 5 = Vomiting  Intensity  1. Sitting to supine 0  2. Supine to L side 0  3. Supine to R side 0  4. Supine to sitting 1  5. L Sidelying (modified dix) 0  6. Up from L  1  7. R Sidelying (modified dix) 0  8. Up from R  1  9. Sitting, head tipped to L knee 3-4, 12/19/23: 0  10. Head up from L knee 0  11. Sitting, head tipped to R knee 3-4, 12/19/23: 0  12. Head up from R knee 0  13. Sitting head turns x5 1-2 (reports if she had to do more head turns, would be unable to do it)  14.Sitting head nods x5 2-3 (felt like it was going to get worse the more she did)  15. In stance, 180 turn to L  5 (not vomiting), 12/19/23: 3  16. In stance, 180 turn to R Pt sat down immediately after turning to the L and unable to try going the other direction, 12/19/23: 3                                                                              M-CTSIB  Condition 1: Firm Surface, EO 30 Sec, Normal Sway  Condition 2: Firm Surface, EC 30 Sec, Normal Sway, felt unsteady   Condition 3: Foam Surface, EO 30 Sec, Normal Sway, felt unsteady  Condition 4: Foam  Surface, EC Attempt 1: 5 Sec,  Attempt 2: 29 seconds, Moderate-severe sway, felt very unsteady                                                        TREATMENT DATE: 12/19/23  Self-Care:  Patient education on hydration, encourage to stay hydrated due to decreased blood pressure in clinic today. Education on moving slowly between transitions to reduce feeling of light headedness and to decrease risk of falls.  NMR: mCTSIB, see assessment above MSQ, habituation to movements including  Bending over to pick up cup x 3 (lateral and front) Positional testing including supine to sitting, sidelying, rolling DHI: 52 moderate handicap  PATIENT EDUCATION: Education details: See self-care above and education on how and when to complete HEP Person educated: Patient Education method: Explanation Education comprehension: verbalized understanding and needs further education  HOME EXERCISE PROGRAM: Bending / Picking Up Objects   Sitting, slowly bend head down and pick up object on the floor. Return to upright position. Hold position until symptoms subside. Repeat __10__ times per session. Do __3__ sessions per day.   Head Motion: Side to Side   Sitting, tilt head down 15-30, slowly move head to right with eyes open. Hold position until symptoms subside. Then, move head slowly to opposite side. Hold position until symptoms subside. Repeat __10__ times per session. Do __3__ sessions per day.    GOALS: Goals reviewed with patient? Yes  SHORT TERM GOALS: ALL STGS = LTGS   LONG TERM GOALS: Target date: 01/11/2024  Pt will be independent with final HEP in regards to balance/vestibular deficits.  Baseline:  Goal status: INITIAL  2.  Pt will report items on MSQ as 2/5 or less in order to demo decr motion sensitivity.  Baseline: see chart on 10/8 Goal status: INITIAL  3.  mCTSIB will improve from 17 seconds foam EC to 30 seconds foam EC in order to improve balance and decrease risk for  falls. Baseline: see chart of 12/19/23 Goal status: REVISED  4.  Patient will improve DHI score to </= 36 to demonstrate reduced symptoms  Baseline: 52 Goal status: REVISED   ASSESSMENT:  CLINICAL IMPRESSION: Patient tolerated continued assessment well today with minimal increase in symptoms. Canal testing today was negative in all positions including negative in B horizontal roll and B side lying. MSQ was completed today, demonstrating motion sensitivity, including light headedness in return to sitting from supine, and return to sitting from R and L sidelying. Education was provided to patient on hydration and segmenting/moving slowly between transitions to decrease feelings of light headedness and risk of falls. DHI showed a moderate handicap on the upper end, with a score of 52. mCTSIB was challenging especially with vision and somatosensory systems eliminated. Patient continues to benefit from skilled physical therapy in order to address goals created for patient, decrease dizziness and decrease risk of falls.   OBJECTIVE IMPAIRMENTS: Abnormal gait, decreased activity tolerance, decreased balance, decreased endurance, decreased mobility, difficulty walking, decreased ROM, dizziness, postural dysfunction, and pain.   ACTIVITY LIMITATIONS: lifting, bending, squatting, stairs, dressing, hygiene/grooming, locomotion level, and caring for others  PARTICIPATION LIMITATIONS: meal prep, cleaning, laundry, driving, and community activity  PERSONAL FACTORS: Behavior pattern, Past/current experiences, Time since onset of injury/illness/exacerbation, and 3+ comorbidities: Left sided occipital neuralgia, vestibular migraine, anxiety, anemia, osteoporosis, PTSD, chronic dizziness, SLE and antiphospholipid  antibody syndrome  are also affecting patient's functional outcome.   REHAB POTENTIAL: Good  CLINICAL DECISION MAKING: Evolving/moderate complexity  EVALUATION COMPLEXITY: Moderate   PLAN:  PT  FREQUENCY: 2x/week  PT DURATION: 4 weeks  PLANNED INTERVENTIONS: 97164- PT Re-evaluation, 97110-Therapeutic exercises, 97530- Therapeutic activity, 97112- Neuromuscular re-education, 97535- Self Care, [id number]- Manual therapy, [phone]- Gait training, [phone]- Canalith repositioning, Patient/Family education, Balance training, Stair training, Vestibular training, and DME instructions  PLAN FOR NEXT SESSION: Possibly test VOR and VOR cancellation if neck motion is tolerated, continue habituation with lightheaded present during return from movement. Review HEP and add accordingly (sit to side lying, cervical flexion and extension habituation). Ask about eye doctor appointment and blurry vision.  Emmalene Sherry, Student-PT 12/19/2023, 10:13 AM

## 2023-12-21 ENCOUNTER — Inpatient Hospital Stay: Attending: Nurse Practitioner

## 2023-12-21 DIAGNOSIS — J302 Other seasonal allergic rhinitis: Secondary | ICD-10-CM | POA: Diagnosis not present

## 2023-12-21 DIAGNOSIS — E538 Deficiency of other specified B group vitamins: Secondary | ICD-10-CM | POA: Insufficient documentation

## 2023-12-21 DIAGNOSIS — D508 Other iron deficiency anemias: Secondary | ICD-10-CM

## 2023-12-21 MED ORDER — CYANOCOBALAMIN 1000 MCG/ML IJ SOLN
1000.0000 ug | INTRAMUSCULAR | Status: DC
  Administered 2023-12-21: 1000 ug via INTRAMUSCULAR
  Filled 2023-12-21: qty 1

## 2023-12-21 NOTE — Progress Notes (Signed)
 VIAL 12-21-23

## 2023-12-22 ENCOUNTER — Ambulatory Visit: Admitting: Physical Therapy

## 2023-12-22 VITALS — BP 96/72 | HR 72

## 2023-12-22 DIAGNOSIS — M25561 Pain in right knee: Secondary | ICD-10-CM | POA: Diagnosis not present

## 2023-12-22 DIAGNOSIS — R42 Dizziness and giddiness: Secondary | ICD-10-CM

## 2023-12-22 DIAGNOSIS — R2681 Unsteadiness on feet: Secondary | ICD-10-CM

## 2023-12-22 DIAGNOSIS — R2689 Other abnormalities of gait and mobility: Secondary | ICD-10-CM

## 2023-12-22 DIAGNOSIS — R293 Abnormal posture: Secondary | ICD-10-CM

## 2023-12-22 NOTE — Therapy (Signed)
 OUTPATIENT PHYSICAL THERAPY VESTIBULAR TREATMENT     Patient Name: Melissa Gaines MRN: [id number] DOB:26-Nov-1970, 53 y.o., female Today's Date: 12/22/2023  END OF SESSION:  PT End of Session - 12/22/23 1056     Visit Number 3    Number of Visits 9    Date for Recertification  01/13/24    Authorization Type [employer]    PT Start Time [phone]    PT Stop Time 1015    PT Time Calculation (min) 43 min    Equipment Utilized During Treatment Gait belt    Activity Tolerance Patient tolerated treatment well;No increased pain    Behavior During Therapy WFL for tasks assessed/performed            Past Medical History:  Diagnosis Date   Anemia    Anxiety    Arthritis    Asthma    Cancer (HCC)    skin cancer   Chronic pain    Complication of anesthesia    Asthma attack during colonoscopy and EGD   Congenital hip dislocation    Depression    GERD (gastroesophageal reflux disease)    Hx pulmonary embolism 07/08/2017   Iron deficiency anemia 04/14/2018   Kidney mass 2017   Migraine    Osteoporosis    Pathological dislocation of shoulder joint, bilateral    congential   Pneumonia    as a child   PTSD (post-traumatic stress disorder)    Sleep apnea    Hx. of no CPAP   Systemic lupus erythematosus (HCC)    Tuberculosis    as a child was treated   Urticaria    Past Surgical History:  Procedure Laterality Date   CESAREAN SECTION  2005   CESAREAN SECTION W/BTL  2010   COLONOSCOPY N/A 09/15/2023   Procedure: COLONOSCOPY;  Surgeon: Leigh Elspeth SQUIBB, MD;  Location: [facility];  Service: Gastroenterology;  Laterality: N/A;   ESOPHAGOGASTRODUODENOSCOPY N/A 09/15/2023   Procedure: EGD (ESOPHAGOGASTRODUODENOSCOPY);  Surgeon: Leigh Elspeth SQUIBB, MD;  Location: [facility];  Service: Gastroenterology;  Laterality: N/A;   HIP SURGERY     in childhood for congenital hip dislocation   LAPAROSCOPIC GASTRIC SLEEVE RESECTION  2014   POLYPECTOMY   09/15/2023   Procedure: POLYPECTOMY, INTESTINE;  Surgeon: Leigh Elspeth SQUIBB, MD;  Location: [facility];  Service: Gastroenterology;;   HARLEY DILATION N/A 09/15/2023   Procedure: EGD, WITH DILATION USING SAVARY-GILLIARD DILATOR OVER GUIDEWIRE;  Surgeon: Leigh Elspeth SQUIBB, MD;  Location: [facility];  Service: Gastroenterology;  Laterality: N/A;   STAPEDECTOMY  11/01/2011   L postauricular stapedectomy with CO2 laser and insertion of 6 x 4.75 mm SMart Piston   TOTAL HIP ARTHROPLASTY     05/2000 R, 11/2000 L   Patient Active Problem List   Diagnosis Date Noted   Hiatal hernia 11/16/2023   Small bowel obstruction (HCC) 10/30/2023   Chronic health problem 10/30/2023   Dysphagia 09/15/2023   Benign neoplasm of colon 09/15/2023   B12 deficiency 05/11/2022   Hyperlipidemia 05/07/2022   Prediabetes 05/07/2022   Environmental and seasonal allergies 08/03/2021   History of colonoscopy 07/31/2021   Asthma 07/01/2021   Gastroesophageal reflux disease 08/19/2020   Tinnitus aurium, 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   Lumbar back pain 01/25/2020   Left hip pain 06/26/2019   Muscle strain of lower extremity, left, initial encounter 06/26/2019   Iron deficiency anemia 04/14/2018   Anemia 03/29/2018   Hematuria, gross 03/29/2018   Antiphospholipid antibody syndrome 01/25/2018   Morbid obesity (HCC)  07/26/2017   Systemic lupus erythematosus (HCC) 07/08/2017   Chronic pain of right knee 05/23/2017   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/21/2016   Vitamin D  deficiency 12/16/2016   Migraine     PCP: McDiarmid, Krystal BIRCH, MD REFERRING PROVIDER: McDiarmid, Krystal BIRCH, MD  REFERRING DIAG: R42 (ICD-10-CM) - Dizziness  THERAPY DIAG:  Dizziness and giddiness  Unsteadiness on feet  Other abnormalities of gait and mobility  Abnormal posture  ONSET DATE: 11/16/2023  Rationale for Evaluation and Treatment: Rehabilitation  SUBJECTIVE:   SUBJECTIVE STATEMENT: Patient states feeling good today, that she feels about the same  and isn't having much dizziness. She states her neck is popping more when she moves it around and she thinks that maybe her neck isn't used to moving and it's moving more now so she thinks that's why it is popping.  Pt accompanied by: self  PERTINENT HISTORY: PMH: Left sided occipital neuralgia, vestibular migraine, anxiety, anemia, osteoporosis, PTSD, chronic dizziness, SLE and antiphospholipid antibody syndrome   Per PCP: Her surgeon took her off of Ubrelvy  due to the nausea.  She hasn't been taking anything for migraines. Frequency:  2-3 days a month. Dizziness occurs intermittent, usually 2 a month but not with the headache.  She did have a headache with vertigo on 11/14/2023 for which she was seen in the ED.  She still has it.    PAIN:  Are you having pain? No and Sinuses are bothering her right now. Takes allergy  medication and that helps sometimes   PRECAUTIONS: Fall   FALLS: Has patient fallen in last 6 months? Yes. Number of falls 3, reports hurt her L knee but then got better with therapy   LIVING ENVIRONMENT: Lives with: lives with her teenage children  Lives in: House/apartment Stairs: Yes: External: 5 or 6 steps; bilateral but cannot reach both Has following equipment at home: Single point cane, Walker - 4 wheeled, and has a cane, but not feel as supportive and feels like she might fall   PLOF: Independent with household mobility without device, Independent with community mobility with device, and Needs assistance with homemaking Can't bend down to put socks on, pt reports due to dislocated hips and dizziness  Has a home aide that helps with cleaning, cooking, getting dressed from the waist down, aide comes daily   PATIENT GOALS: Wants to improve her balance, have more control of her legs, doesn't want to fall   OBJECTIVE:  Note: Objective measures were completed at Evaluation unless otherwise noted.  DIAGNOSTIC FINDINGS: MRI of cervical spine on 05/30/2023 which revealed  small right paracentral disc protrusions at C4-5 and C6-7 and additional mild noncompressive disc bulging at C3-4 and C5-6 without stenosis or impingement.   MRI brain 11/21/23: IMPRESSION: Stable 4 x 5 mm focus of nodular enhancing tissue within the infundibulum with a diminutive infundibulum beyond that. 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 Stability since 2022 ensures a benign nature. Craniopharyngioma is essentially excluded. Infundibular metastasis is excluded.  COGNITION: Overall cognitive status: Within functional limits for tasks assessed    Cervical ROM:   pt very guarded with cervical movements    GAIT: Gait pattern: decreased stance time- Right, decreased stride length, antalgic, trendelenburg, lateral lean- Right, and trunk flexed Distance walked: Clinic distances  Assistive device utilized: None Level of assistance: Modified independence   VESTIBULAR ASSESSMENT:  GENERAL OBSERVATION: Ambulates in with no AD, antalgic gait pattern with trunk lean to R   SYMPTOM BEHAVIOR:  Subjective  history: See above   Non-Vestibular symptoms: changes in vision, headaches, migraine symptoms, and pt reporting has blurred vision and has seen an eye doctor and reports she has cataracts, does not wear glasses as she feels it makes it worse   Type of dizziness: Imbalance (Disequilibrium) and Spinning/Vertigo  Frequency: Comes and goes, feels like episodes are worse when her allergies are worse   Duration: ~5 minutes if she has episodes   Aggravating factors: Induced by motion: turning head quickly and sitting up and down, being in a car for too long and head will start to spin   Relieving factors: dark room, closing eyes, and putting cold water on her face   Progression of symptoms: better  OCULOMOTOR EXAM:  Ocular Alignment: normal  Ocular ROM: No Limitations  Spontaneous Nystagmus: absent  Gaze-Induced Nystagmus:  absent  Smooth Pursuits: intact and reports this bothered her eyes, more blinking, some jumping going through midline   Saccades: extra eye movements and ~2 saccadic beats in horizontal direction, incr dizziness when going back to the center, ~2 saccadic beats in vertical direction, did not cause dizziness  Notes brighter lights in tx room was triggering for a headache, so turned down one of the bright lights    VESTIBULAR - OCULAR REFLEX:   Slow VOR: pt very guarded, does not let PT move her head even slowly, PT tried multiple times, when asked to move her head pt can move it [facility] and reports it makes her dizzy   VOR Cancellation: unable to test due to guarding   Head-Impulse Test: unable to test due to guarding   MOTION SENSITIVITY:  Motion Sensitivity Quotient Intensity: 0 = none, 1 = Lightheaded, 2 = Mild, 3 = Moderate, 4 = Severe, 5 = Vomiting  Intensity  1. Sitting to supine 0  2. Supine to L side 0  3. Supine to R side 0  4. Supine to sitting 1  5. L Sidelying (modified dix) 0  6. Up from L  1  7. R Sidelying (modified dix) 0  8. Up from R  1  9. Sitting, head tipped to L knee 3-4, 12/19/23: 0  10. Head up from L knee 0  11. Sitting, head tipped to R knee 3-4, 12/19/23: 0  12. Head up from R knee 0  13. Sitting head turns x5 1-2 (reports if she had to do more head turns, would be unable to do it)  14.Sitting head nods x5 2-3 (felt like it was going to get worse the more she did)  15. In stance, 180 turn to L  5 (not vomiting), 12/19/23: 3  16. In stance, 180 turn to R Pt sat down immediately after turning to the L and unable to try going the other direction, 12/19/23: 3                                                                              M-CTSIB  Condition 1: Firm Surface, EO 30 Sec, Normal Sway  Condition 2: Firm Surface, EC 30 Sec, Normal Sway, felt unsteady   Condition 3: Foam Surface, EO 30 Sec, Normal Sway, felt unsteady  Condition 4: Foam Surface, EC  Attempt 1:  5 Sec, Attempt 2: 29 seconds, Moderate-severe sway, felt very unsteady                                                        TREATMENT DATE: 12/22/23  NMR, balance improvement, vestibular adaptation, with and without vision: Cervical range of motion: flexion, extension, rotation, x 10 total reps each Gaze Adaptation: - Viewing horizontal, sitting, 2 sets x 30 seconds, 1/5 dizziness - Viewing horizontal, standing, 1 set x 30 seconds, 2/5 dizziness - Viewing vertical, sitting, 3 sets x 30 seconds each, 2/5 dizziness *All plain background - [place]omberg on foam eyes closed, 3 sets x 30 seconds, minimal dizziness reported - [place]omberg on foams eyes open with head turns, 3 sets x 30 seconds, minimal dizziness reported  Self-Care:  - Patient education, encourage to stay hydrated due to continued decreased blood pressure in clinic today. Education on HEP including horizontal and vertical gaze adaptation and picking up cup from ground.  Today's Vitals   12/22/23 0940  BP: 96/72  Pulse: 72   There is no height or weight on file to calculate BMI.    PATIENT EDUCATION: Education details: See self-care above and education on how and when to complete HEP Person educated: Patient Education method: Explanation Education comprehension: verbalized understanding and needs further education  HOME EXERCISE PROGRAM: Bending / Picking Up Objects   Sitting, slowly bend head down and pick up object on the floor. Return to upright position. Hold position until symptoms subside. Repeat __10__ times per session. Do __3__ sessions per day.   Head Motion: Side to Side   Sitting, tilt head down 15-30, slowly move head to right with eyes open. Hold position until symptoms subside. Then, move head slowly to opposite side. Hold position until symptoms subside. Repeat __10__ times per session. Do __3__ sessions per day.   Gaze Stabilization: Sitting   Keeping eyes on target on wall a few feet  away, tilt head down 15-30 and move head side to side for 30 seconds. Repeat while moving head up and down for 30 seconds. Do 2 session per day for 3 rounds.  GOALS: Goals reviewed with patient? Yes  SHORT TERM GOALS: ALL STGS = LTGS   LONG TERM GOALS: Target date: 01/11/2024  Pt will be independent with final HEP in regards to balance/vestibular deficits.  Baseline:  Goal status: INITIAL  2.  Pt will report items on MSQ as 2/5 or less in order to demo decr motion sensitivity.  Baseline: see chart on 10/8 Goal status: INITIAL  3.  mCTSIB will improve from 17 seconds foam EC to 30 seconds foam EC in order to improve balance and decrease risk for falls. Baseline: see chart of 12/19/23 Goal status: REVISED  4.  Patient will improve DHI score to </= 36 to demonstrate reduced symptoms  Baseline: 52 Goal status: REVISED   ASSESSMENT:  CLINICAL IMPRESSION: Patient ambulated into clinic without AD and antalgic gait. Session was well tolerated today with minimal to mild dizziness present throughout session. BP continues to be low and pt was educated on hydration. Pt denies any symptoms of feeling light headed or faint and states that her low BP is normal as she doesn't eat breakfast usually. Gaze stabilization exercises were included today to improve dizziness with head turns. Balance exercise was continued on  a complaint surface with eyes open and closed. Patient continues to benefit from skilled physical therapy in order to address goals created for patient, decrease dizziness and decrease risk of falls.   OBJECTIVE IMPAIRMENTS: Abnormal gait, decreased activity tolerance, decreased balance, decreased endurance, decreased mobility, difficulty walking, decreased ROM, dizziness, postural dysfunction, and pain.   ACTIVITY LIMITATIONS: lifting, bending, squatting, stairs, dressing, hygiene/grooming, locomotion level, and caring for others  PARTICIPATION LIMITATIONS: meal prep, cleaning,  laundry, driving, and community activity  PERSONAL FACTORS: Behavior pattern, Past/current experiences, Time since onset of injury/illness/exacerbation, and 3+ comorbidities: Left sided occipital neuralgia, vestibular migraine, anxiety, anemia, osteoporosis, PTSD, chronic dizziness, SLE and antiphospholipid antibody syndrome  are also affecting patient's functional outcome.   REHAB POTENTIAL: Good  CLINICAL DECISION MAKING: Evolving/moderate complexity  EVALUATION COMPLEXITY: Moderate   PLAN:  PT FREQUENCY: 2x/week  PT DURATION: 4 weeks  PLANNED INTERVENTIONS: 97164- PT Re-evaluation, 97110-Therapeutic exercises, 97530- Therapeutic activity, 97112- Neuromuscular re-education, 97535- Self Care, [id number]- Manual therapy, [phone]- Gait training, [phone]- Canalith repositioning, Patient/Family education, Balance training, Stair training, Vestibular training, and DME instructions  PLAN FOR NEXT SESSION: Continue habituation with lightheaded present during return from movement. Add to HEP accordingly (I.e. sit to side lying).  Emmalene Sherry, Student-PT 12/22/2023, 1:09 PM

## 2023-12-22 NOTE — Patient Instructions (Signed)
 Gaze Stabilization: Sitting    Keeping eyes on target on wall a few feet away, tilt head down 15-30 and move head side to side for 30 seconds. Repeat while moving head up and down for 30 seconds. Do 2 session per day for 3 rounds.  Copyright  [employer]. All rights reserved.

## 2023-12-26 ENCOUNTER — Ambulatory Visit

## 2023-12-26 VITALS — BP 111/69 | HR 60

## 2023-12-26 DIAGNOSIS — M25561 Pain in right knee: Secondary | ICD-10-CM | POA: Diagnosis not present

## 2023-12-26 DIAGNOSIS — R2681 Unsteadiness on feet: Secondary | ICD-10-CM

## 2023-12-26 DIAGNOSIS — R2689 Other abnormalities of gait and mobility: Secondary | ICD-10-CM

## 2023-12-26 DIAGNOSIS — R42 Dizziness and giddiness: Secondary | ICD-10-CM

## 2023-12-26 NOTE — Therapy (Addendum)
 OUTPATIENT PHYSICAL THERAPY VESTIBULAR TREATMENT     Patient Name: Melissa Gaines MRN: [id number] DOB:January 19, 1971, 53 y.o., female Today's Date: 12/26/2023  END OF SESSION:  PT End of Session - 12/26/23 1020     Visit Number 4    Number of Visits 9    Date for Recertification  01/13/24    Authorization Type [employer]    PT Start Time [phone]    PT Stop Time 1015    PT Time Calculation (min) 44 min    Equipment Utilized During Treatment Gait belt    Activity Tolerance Patient tolerated treatment well;No increased pain    Behavior During Therapy WFL for tasks assessed/performed             Past Medical History:  Diagnosis Date   Anemia    Anxiety    Arthritis    Asthma    Cancer (HCC)    skin cancer   Chronic pain    Complication of anesthesia    Asthma attack during colonoscopy and EGD   Congenital hip dislocation    Depression    GERD (gastroesophageal reflux disease)    Hx pulmonary embolism 07/08/2017   Iron deficiency anemia 04/14/2018   Kidney mass 2017   Migraine    Osteoporosis    Pathological dislocation of shoulder joint, bilateral    congential   Pneumonia    as a child   PTSD (post-traumatic stress disorder)    Sleep apnea    Hx. of no CPAP   Systemic lupus erythematosus (HCC)    Tuberculosis    as a child was treated   Urticaria    Past Surgical History:  Procedure Laterality Date   CESAREAN SECTION  2005   CESAREAN SECTION W/BTL  2010   COLONOSCOPY N/A 09/15/2023   Procedure: COLONOSCOPY;  Surgeon: Leigh Elspeth SQUIBB, MD;  Location: [facility];  Service: Gastroenterology;  Laterality: N/A;   ESOPHAGOGASTRODUODENOSCOPY N/A 09/15/2023   Procedure: EGD (ESOPHAGOGASTRODUODENOSCOPY);  Surgeon: Leigh Elspeth SQUIBB, MD;  Location: [facility];  Service: Gastroenterology;  Laterality: N/A;   HIP SURGERY     in childhood for congenital hip dislocation   LAPAROSCOPIC GASTRIC SLEEVE RESECTION  2014    POLYPECTOMY  09/15/2023   Procedure: POLYPECTOMY, INTESTINE;  Surgeon: Leigh Elspeth SQUIBB, MD;  Location: [facility];  Service: Gastroenterology;;   HARLEY DILATION N/A 09/15/2023   Procedure: EGD, WITH DILATION USING SAVARY-GILLIARD DILATOR OVER GUIDEWIRE;  Surgeon: Leigh Elspeth SQUIBB, MD;  Location: [facility];  Service: Gastroenterology;  Laterality: N/A;   STAPEDECTOMY  11/01/2011   L postauricular stapedectomy with CO2 laser and insertion of 6 x 4.75 mm SMart Piston   TOTAL HIP ARTHROPLASTY     05/2000 R, 11/2000 L   Patient Active Problem List   Diagnosis Date Noted   Hiatal hernia 11/16/2023   Small bowel obstruction (HCC) 10/30/2023   Chronic health problem 10/30/2023   Dysphagia 09/15/2023   Benign neoplasm of colon 09/15/2023   B12 deficiency 05/11/2022   Hyperlipidemia 05/07/2022   Prediabetes 05/07/2022   Environmental and seasonal allergies 08/03/2021   History of colonoscopy 07/31/2021   Asthma 07/01/2021   Gastroesophageal reflux disease 08/19/2020   Tinnitus aurium, 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   Lumbar back pain 01/25/2020   Left hip pain 06/26/2019   Muscle strain of lower extremity, left, initial encounter 06/26/2019   Iron deficiency anemia 04/14/2018   Anemia 03/29/2018   Hematuria, gross 03/29/2018   Antiphospholipid antibody syndrome 01/25/2018   Morbid obesity (  HCC) 07/26/2017   Systemic lupus erythematosus (HCC) 07/08/2017   Chronic pain of right knee 05/23/2017   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/21/2016   Vitamin D  deficiency 12/16/2016   Migraine     PCP: McDiarmid, Krystal BIRCH, MD REFERRING PROVIDER: McDiarmid, Krystal BIRCH, MD  REFERRING DIAG: R42 (ICD-10-CM) - Dizziness  THERAPY DIAG:  Dizziness and giddiness  Unsteadiness on feet  Other abnormalities of gait and mobility  ONSET DATE: 11/16/2023  Rationale for Evaluation and Treatment: Rehabilitation  SUBJECTIVE:   SUBJECTIVE STATEMENT: 12/26/2023: Patient states they have an electric shock in legs and usually  has it and isn't new but it moves around the body. Patient states current dizziness is 1/5. Patient still has blurry vision and denies falls.  Pt accompanied by: self  PERTINENT HISTORY: PMH: Left sided occipital neuralgia, vestibular migraine, anxiety, anemia, osteoporosis, PTSD, chronic dizziness, SLE and antiphospholipid antibody syndrome   Per PCP: Her surgeon took her off of Ubrelvy  due to the nausea.  She hasn't been taking anything for migraines. Frequency:  2-3 days a month. Dizziness occurs intermittent, usually 2 a month but not with the headache.  She did have a headache with vertigo on 11/14/2023 for which she was seen in the ED.  She still has it.    PAIN:  Are you having pain? No and Sinuses are bothering her right now. Takes allergy  medication and that helps sometimes   PRECAUTIONS: Fall   FALLS: Has patient fallen in last 6 months? Yes. Number of falls 3, reports hurt her L knee but then got better with therapy   LIVING ENVIRONMENT: Lives with: lives with her teenage children  Lives in: House/apartment Stairs: Yes: External: 5 or 6 steps; bilateral but cannot reach both Has following equipment at home: Single point cane, Walker - 4 wheeled, and has a cane, but not feel as supportive and feels like she might fall   PLOF: Independent with household mobility without device, Independent with community mobility with device, and Needs assistance with homemaking Can't bend down to put socks on, pt reports due to dislocated hips and dizziness  Has a home aide that helps with cleaning, cooking, getting dressed from the waist down, aide comes daily   PATIENT GOALS: Wants to improve her balance, have more control of her legs, doesn't want to fall   OBJECTIVE:  Note: Objective measures were completed at Evaluation unless otherwise noted.  DIAGNOSTIC FINDINGS: MRI of cervical spine on 05/30/2023 which revealed small right paracentral disc protrusions at C4-5 and C6-7 and additional  mild noncompressive disc bulging at C3-4 and C5-6 without stenosis or impingement.   MRI brain 11/21/23: IMPRESSION: Stable 4 x 5 mm focus of nodular enhancing tissue within the infundibulum with a diminutive infundibulum beyond that. 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 Stability since 2022 ensures a benign nature. Craniopharyngioma is essentially excluded. Infundibular metastasis is excluded.  COGNITION: Overall cognitive status: Within functional limits for tasks assessed    Cervical ROM:   pt very guarded with cervical movements    GAIT: Gait pattern: decreased stance time- Right, decreased stride length, antalgic, trendelenburg, lateral lean- Right, and trunk flexed Distance walked: Clinic distances  Assistive device utilized: None Level of assistance: Modified independence   VESTIBULAR ASSESSMENT:  GENERAL OBSERVATION: Ambulates in with no AD, antalgic gait pattern with trunk lean to R   SYMPTOM BEHAVIOR:  Subjective history: See above   Non-Vestibular symptoms: changes in vision, headaches, migraine symptoms, and pt reporting has  blurred vision and has seen an eye doctor and reports she has cataracts, does not wear glasses as she feels it makes it worse   Type of dizziness: Imbalance (Disequilibrium) and Spinning/Vertigo  Frequency: Comes and goes, feels like episodes are worse when her allergies are worse   Duration: ~5 minutes if she has episodes   Aggravating factors: Induced by motion: turning head quickly and sitting up and down, being in a car for too long and head will start to spin   Relieving factors: dark room, closing eyes, and putting cold water on her face   Progression of symptoms: better  OCULOMOTOR EXAM:  Ocular Alignment: normal  Ocular ROM: No Limitations  Spontaneous Nystagmus: absent  Gaze-Induced Nystagmus: absent  Smooth Pursuits: intact and reports this bothered her eyes, more  blinking, some jumping going through midline   Saccades: extra eye movements and ~2 saccadic beats in horizontal direction, incr dizziness when going back to the center, ~2 saccadic beats in vertical direction, did not cause dizziness  Notes brighter lights in tx room was triggering for a headache, so turned down one of the bright lights    VESTIBULAR - OCULAR REFLEX:   Slow VOR: pt very guarded, does not let PT move her head even slowly, PT tried multiple times, when asked to move her head pt can move it [facility] and reports it makes her dizzy   VOR Cancellation: unable to test due to guarding   Head-Impulse Test: unable to test due to guarding   MOTION SENSITIVITY:  Motion Sensitivity Quotient Intensity: 0 = none, 1 = Lightheaded, 2 = Mild, 3 = Moderate, 4 = Severe, 5 = Vomiting  Intensity  1. Sitting to supine 0  2. Supine to L side 0  3. Supine to R side 0  4. Supine to sitting 1  5. L Sidelying (modified dix) 0  6. Up from L  1  7. R Sidelying (modified dix) 0  8. Up from R  1  9. Sitting, head tipped to L knee 3-4, 12/19/23: 0  10. Head up from L knee 0  11. Sitting, head tipped to R knee 3-4, 12/19/23: 0  12. Head up from R knee 0  13. Sitting head turns x5 1-2 (reports if she had to do more head turns, would be unable to do it)  14.Sitting head nods x5 2-3 (felt like it was going to get worse the more she did)  15. In stance, 180 turn to L  5 (not vomiting), 12/19/23: 3  16. In stance, 180 turn to R Pt sat down immediately after turning to the L and unable to try going the other direction, 12/19/23: 3                                                                              M-CTSIB  Condition 1: Firm Surface, EO 30 Sec, Normal Sway  Condition 2: Firm Surface, EC 30 Sec, Normal Sway, felt unsteady   Condition 3: Foam Surface, EO 30 Sec, Normal Sway, felt unsteady  Condition 4: Foam Surface, EC Attempt 1: 5 Sec, Attempt 2: 29 seconds, Moderate-severe sway, felt very  unsteady  TREATMENT DATE: 12/26/23  NMR, balance improvement, vestibular adaptation, with and without vision, motion adaptation:  Gaze Adaptation: - Rhomberg on foam EO and EC x 30 seconds each, SBA - Rhomberg on foam eyes open and closed x 30 seconds each, no to minimal dizziness reported, SBA - Rhomberg on foam eyes open with vertical head turns, 2 sets x 20 total turns mod dizziness reported, SBA - Rhomberg on foam eyes open with horizontal head turns, 2 sets x 20 total turns minimal dizziness, SBA - Card pick up in standing, in ascending and descending order x 12 cards each round, 1-1.5/5 dizziness reported, CGA - Spot it card (baseball) pick up, find the matches with 4 cards x 6 rounds, 3/5 dizziness reported, CGA, min assist with fatigue *CGA needed during standing and bending over tasks  Self-Care:  - Continued education and review on HEP including horizontal and vertical gaze adaptation and picking up cup from ground.  Today's Vitals   12/26/23 0940 12/26/23 0942  BP: 111/69   Pulse: (!) 46 60    There is no height or weight on file to calculate BMI.    PATIENT EDUCATION: Education details: See self-care above and education on how and when to complete HEP Person educated: Patient Education method: Explanation Education comprehension: verbalized understanding and needs further education  HOME EXERCISE PROGRAM: Bending / Picking Up Objects   Sitting, slowly bend head down and pick up object on the floor. Return to upright position. Hold position until symptoms subside. Repeat __10__ times per session. Do __3__ sessions per day.   Head Motion: Side to Side   Sitting, tilt head down 15-30, slowly move head to right with eyes open. Hold position until symptoms subside. Then, move head slowly to opposite side. Hold position until symptoms subside. Repeat __10__ times per session. Do __3__ sessions per day.   Gaze  Stabilization: Sitting   Keeping eyes on target on wall a few feet away, tilt head down 15-30 and move head side to side for 30 seconds. Repeat while moving head up and down for 30 seconds. Do 2 session per day for 3 rounds.  GOALS: Goals reviewed with patient? Yes  SHORT TERM GOALS: ALL STGS = LTGS   LONG TERM GOALS: Target date: 01/11/2024  Pt will be independent with final HEP in regards to balance/vestibular deficits.  Baseline:  Goal status: INITIAL  2.  Pt will report items on MSQ as 2/5 or less in order to demo decr motion sensitivity.  Baseline: see chart on 10/8 Goal status: INITIAL  3.  mCTSIB will improve from 17 seconds foam EC to 30 seconds foam EC in order to improve balance and decrease risk for falls. Baseline: see chart of 12/19/23 Goal status: REVISED  4.  Patient will improve DHI score to </= 36 to demonstrate reduced symptoms  Baseline: 52 Goal status: REVISED   ASSESSMENT:  CLINICAL IMPRESSION: Patient ambulated into clinic without AD and antalgic gait. Patient was brought by transportation service today. Session today continued with balance training with head movements and compliant surface for vestibular adaptation. More motion training including bending in standing was included today for decreasing motion sensitivity. All movements well tolerated with more dizziness present during spot it task of matching and bending over to pick up card with CGA to min assist needed with fatigue. Patient continues to benefit from skilled physical therapy in order to address goals created for patient, decrease dizziness and decrease risk of falls.   OBJECTIVE IMPAIRMENTS: Abnormal  gait, decreased activity tolerance, decreased balance, decreased endurance, decreased mobility, difficulty walking, decreased ROM, dizziness, postural dysfunction, and pain.   ACTIVITY LIMITATIONS: lifting, bending, squatting, stairs, dressing, hygiene/grooming, locomotion level, and caring for  others  PARTICIPATION LIMITATIONS: meal prep, cleaning, laundry, driving, and community activity  PERSONAL FACTORS: Behavior pattern, Past/current experiences, Time since onset of injury/illness/exacerbation, and 3+ comorbidities: Left sided occipital neuralgia, vestibular migraine, anxiety, anemia, osteoporosis, PTSD, chronic dizziness, SLE and antiphospholipid antibody syndrome  are also affecting patient's functional outcome.   REHAB POTENTIAL: Good  CLINICAL DECISION MAKING: Evolving/moderate complexity  EVALUATION COMPLEXITY: Moderate   PLAN:  PT FREQUENCY: 2x/week  PT DURATION: 4 weeks  PLANNED INTERVENTIONS: 97164- PT Re-evaluation, 97110-Therapeutic exercises, 97530- Therapeutic activity, 97112- Neuromuscular re-education, 97535- Self Care, [id number]- Manual therapy, [phone]- Gait training, [phone]- Canalith repositioning, Patient/Family education, Balance training, Stair training, Vestibular training, and DME instructions  PLAN FOR NEXT SESSION: Continue habituation with lightheaded present during return from movement. Continue to advance bending tasks including spot it cards but can change background or add balance component depending on symptoms. Introduce brandt daroff.  Emmalene Sherry, Student-PT 12/26/2023, 10:28 AM

## 2023-12-29 ENCOUNTER — Ambulatory Visit: Admitting: Physical Therapy

## 2023-12-29 ENCOUNTER — Encounter: Payer: Self-pay | Admitting: Physical Therapy

## 2023-12-29 ENCOUNTER — Encounter (HOSPITAL_COMMUNITY): Payer: Self-pay | Admitting: Internal Medicine

## 2023-12-29 VITALS — BP 88/59 | HR 81

## 2023-12-29 DIAGNOSIS — M25561 Pain in right knee: Secondary | ICD-10-CM | POA: Diagnosis not present

## 2023-12-29 DIAGNOSIS — R2681 Unsteadiness on feet: Secondary | ICD-10-CM

## 2023-12-29 DIAGNOSIS — R2689 Other abnormalities of gait and mobility: Secondary | ICD-10-CM

## 2023-12-29 DIAGNOSIS — R42 Dizziness and giddiness: Secondary | ICD-10-CM

## 2023-12-29 NOTE — Therapy (Addendum)
 OUTPATIENT PHYSICAL THERAPY VESTIBULAR TREATMENT     Patient Name: Melissa Gaines MRN: [id number] DOB:[birth year], 53 y.o., female Today's Date: 12/29/2023  END OF SESSION:  PT End of Session - 12/29/23 0907     Visit Number 5    Number of Visits 9    Date for Recertification  01/13/24    Authorization Type [employer]    PT Start Time [phone]    PT Stop Time [phone]    PT Time Calculation (min) 43 min    Equipment Utilized During Treatment Gait belt    Activity Tolerance Patient tolerated treatment well   initial low BP at start of session   Behavior During Therapy [facility] for tasks assessed/performed             Past Medical History:  Diagnosis Date   Anemia    Anxiety    Arthritis    Asthma    Cancer (HCC)    skin cancer   Chronic pain    Complication of anesthesia    Asthma attack during colonoscopy and EGD   Congenital hip dislocation    Depression    GERD (gastroesophageal reflux disease)    Hx pulmonary embolism 07/08/2017   Iron deficiency anemia 04/14/2018   Kidney mass 2017   Migraine    Osteoporosis    Pathological dislocation of shoulder joint, bilateral    congential   Pneumonia    as a child   PTSD (post-traumatic stress disorder)    Sleep apnea    Hx. of no CPAP   Systemic lupus erythematosus (HCC)    Tuberculosis    as a child was treated   Urticaria    Past Surgical History:  Procedure Laterality Date   CESAREAN SECTION  2005   CESAREAN SECTION W/BTL  2010   COLONOSCOPY N/A 09/15/2023   Procedure: COLONOSCOPY;  Surgeon: Leigh Elspeth SQUIBB, MD;  Location: [facility];  Service: Gastroenterology;  Laterality: N/A;   ESOPHAGOGASTRODUODENOSCOPY N/A 09/15/2023   Procedure: EGD (ESOPHAGOGASTRODUODENOSCOPY);  Surgeon: Leigh Elspeth SQUIBB, MD;  Location: [facility];  Service: Gastroenterology;  Laterality: N/A;   HIP SURGERY     in childhood for congenital hip dislocation   LAPAROSCOPIC GASTRIC SLEEVE RESECTION   2014   POLYPECTOMY  09/15/2023   Procedure: POLYPECTOMY, INTESTINE;  Surgeon: Leigh Elspeth SQUIBB, MD;  Location: [facility];  Service: Gastroenterology;;   HARLEY DILATION N/A 09/15/2023   Procedure: EGD, WITH DILATION USING SAVARY-GILLIARD DILATOR OVER GUIDEWIRE;  Surgeon: Leigh Elspeth SQUIBB, MD;  Location: [facility];  Service: Gastroenterology;  Laterality: N/A;   STAPEDECTOMY  11/01/2011   L postauricular stapedectomy with CO2 laser and insertion of 6 x 4.75 mm SMart Piston   TOTAL HIP ARTHROPLASTY     05/2000 R, 11/2000 L   Patient Active Problem List   Diagnosis Date Noted   Hiatal hernia 11/16/2023   Small bowel obstruction (HCC) 10/30/2023   Chronic health problem 10/30/2023   Dysphagia 09/15/2023   Benign neoplasm of colon 09/15/2023   B12 deficiency 05/11/2022   Hyperlipidemia 05/07/2022   Prediabetes 05/07/2022   Environmental and seasonal allergies 08/03/2021   History of colonoscopy 07/31/2021   Asthma 07/01/2021   Gastroesophageal reflux disease 08/19/2020   Tinnitus aurium, 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   Lumbar back pain 01/25/2020   Left hip pain 06/26/2019   Muscle strain of lower extremity, left, initial encounter 06/26/2019   Iron deficiency anemia 04/14/2018   Anemia 03/29/2018   Hematuria, gross 03/29/2018   Antiphospholipid antibody  syndrome 01/25/2018   Morbid obesity (HCC) 07/26/2017   Systemic lupus erythematosus (HCC) 07/08/2017   Chronic pain of right knee 05/23/2017   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/21/2016   Vitamin D  deficiency 12/16/2016   Migraine     PCP: McDiarmid, Krystal BIRCH, MD REFERRING PROVIDER: McDiarmid, Krystal BIRCH, MD  REFERRING DIAG: R42 (ICD-10-CM) - Dizziness  THERAPY DIAG:  Dizziness and giddiness  Unsteadiness on feet  Other abnormalities of gait and mobility  ONSET DATE: 11/16/2023  Rationale for Evaluation and Treatment: Rehabilitation  SUBJECTIVE:   SUBJECTIVE STATEMENT:  Dizziness is not bad. Only getting dizziness when being a  passenger in the car. Pt has no questions about her exercises. Pt reports her knee is acting up today.   Pt accompanied by: self  PERTINENT HISTORY: PMH: Left sided occipital neuralgia, vestibular migraine, anxiety, anemia, osteoporosis, PTSD, chronic dizziness, SLE and antiphospholipid antibody syndrome   Per PCP: Her surgeon took her off of Ubrelvy  due to the nausea.  She hasn't been taking anything for migraines. Frequency:  2-3 days a month. Dizziness occurs intermittent, usually 2 a month but not with the headache.  She did have a headache with vertigo on 11/14/2023 for which she was seen in the ED.  She still has it.    PAIN:  Are you having pain? No and Sinuses are bothering her right now. Takes allergy  medication and that helps sometimes   PRECAUTIONS: Fall   FALLS: Has patient fallen in last 6 months? Yes. Number of falls 3, reports hurt her L knee but then got better with therapy   LIVING ENVIRONMENT: Lives with: lives with her teenage children  Lives in: House/apartment Stairs: Yes: External: 5 or 6 steps; bilateral but cannot reach both Has following equipment at home: Single point cane, Walker - 4 wheeled, and has a cane, but not feel as supportive and feels like she might fall   PLOF: Independent with household mobility without device, Independent with community mobility with device, and Needs assistance with homemaking Can't bend down to put socks on, pt reports due to dislocated hips and dizziness  Has a home aide that helps with cleaning, cooking, getting dressed from the waist down, aide comes daily   PATIENT GOALS: Wants to improve her balance, have more control of her legs, doesn't want to fall   OBJECTIVE:  Note: Objective measures were completed at Evaluation unless otherwise noted.  DIAGNOSTIC FINDINGS: MRI of cervical spine on 05/30/2023 which revealed small right paracentral disc protrusions at C4-5 and C6-7 and additional mild noncompressive disc bulging at C3-4  and C5-6 without stenosis or impingement.   MRI brain 11/21/23: IMPRESSION: Stable 4 x 5 mm focus of nodular enhancing tissue within the infundibulum with a diminutive infundibulum beyond that. 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 Stability since 2022 ensures a benign nature. Craniopharyngioma is essentially excluded. Infundibular metastasis is excluded.  COGNITION: Overall cognitive status: Within functional limits for tasks assessed    Cervical ROM:   pt very guarded with cervical movements    GAIT: Gait pattern: decreased stance time- Right, decreased stride length, antalgic, trendelenburg, lateral lean- Right, and trunk flexed Distance walked: Clinic distances  Assistive device utilized: None Level of assistance: Modified independence   VESTIBULAR ASSESSMENT:  GENERAL OBSERVATION: Ambulates in with no AD, antalgic gait pattern with trunk lean to R   SYMPTOM BEHAVIOR:  Subjective history: See above   Non-Vestibular symptoms: changes in vision, headaches, migraine symptoms, and pt reporting has  blurred vision and has seen an eye doctor and reports she has cataracts, does not wear glasses as she feels it makes it worse   Type of dizziness: Imbalance (Disequilibrium) and Spinning/Vertigo  Frequency: Comes and goes, feels like episodes are worse when her allergies are worse   Duration: ~5 minutes if she has episodes   Aggravating factors: Induced by motion: turning head quickly and sitting up and down, being in a car for too long and head will start to spin   Relieving factors: dark room, closing eyes, and putting cold water on her face   Progression of symptoms: better  OCULOMOTOR EXAM:  Ocular Alignment: normal  Ocular ROM: No Limitations  Spontaneous Nystagmus: absent  Gaze-Induced Nystagmus: absent  Smooth Pursuits: intact and reports this bothered her eyes, more blinking, some jumping going through midline    Saccades: extra eye movements and ~2 saccadic beats in horizontal direction, incr dizziness when going back to the center, ~2 saccadic beats in vertical direction, did not cause dizziness  Notes brighter lights in tx room was triggering for a headache, so turned down one of the bright lights    VESTIBULAR - OCULAR REFLEX:   Slow VOR: pt very guarded, does not let PT move her head even slowly, PT tried multiple times, when asked to move her head pt can move it [facility] and reports it makes her dizzy   VOR Cancellation: unable to test due to guarding   Head-Impulse Test: unable to test due to guarding   MOTION SENSITIVITY:  Motion Sensitivity Quotient Intensity: 0 = none, 1 = Lightheaded, 2 = Mild, 3 = Moderate, 4 = Severe, 5 = Vomiting  Intensity  1. Sitting to supine 0  2. Supine to L side 0  3. Supine to R side 0  4. Supine to sitting 1  5. L Sidelying (modified dix) 0  6. Up from L  1  7. R Sidelying (modified dix) 0  8. Up from R  1  9. Sitting, head tipped to L knee 3-4, 12/19/23: 0  10. Head up from L knee 0  11. Sitting, head tipped to R knee 3-4, 12/19/23: 0  12. Head up from R knee 0  13. Sitting head turns x5 1-2 (reports if she had to do more head turns, would be unable to do it)  14.Sitting head nods x5 2-3 (felt like it was going to get worse the more she did)  15. In stance, 180 turn to L  5 (not vomiting), 12/19/23: 3  16. In stance, 180 turn to R Pt sat down immediately after turning to the L and unable to try going the other direction, 12/19/23: 3                                                                              M-CTSIB  Condition 1: Firm Surface, EO 30 Sec, Normal Sway  Condition 2: Firm Surface, EC 30 Sec, Normal Sway, felt unsteady   Condition 3: Foam Surface, EO 30 Sec, Normal Sway, felt unsteady  Condition 4: Foam Surface, EC Attempt 1: 5 Sec, Attempt 2: 29 seconds, Moderate-severe sway, felt very unsteady  TREATMENT DATE: 12/29/23  Self-Care:  Vitals:   12/29/23 0914 12/29/23 0918  BP: (!) 88/55 (!) 88/59  Pulse: 70 81   Assessed in seated/standing. Pt reporting eyes felt a little more foggy in standing, but did not report any dizziness/lightheadedness  Pt reports she drank a little water this morning and did not eat breakfast. Water provided during session.   Assessed BP again in standing at 9:36 after seated activity: 101/74  NMR Gaze Adaptation: x1 Viewing Horizontal: Position: Seated with busy background, Time: 30 seconds, Reps: 3, and Comment: pt reports starting to have a headache with this (pt reports this normally comes before the dizziness), tried a 2nd rep with trying to gentle incr speed and pt reporting that it made her headache worse. Reports if she doesn't focus on the headache then it will decr   x1 Viewing Vertical:  Position:  Seated with busy background, Time: 30 seconds, Reps: 2, and Comment: pt reporting that head hurts a little more up and down compared to horizontal direction, feels more tense in her neck   Seated VOR Cancellation with busy background: 2 x 10 reps Pt with mild dizziness afterwards, felt like eyes were starting to get cloudy   On air ex for incr vestibular input:  2 x 5 reps sit <> stands with EC, pt reporting feeling off balance with this.  With feet hip width 2 x 30 seconds EC, pt reporting feeling nauseous and dizzy with pt needing a seated rest break afterwards  Standing with feet hip width: EO 2 x 10 reps, pt reports feeling like she is on a rollercoaster  Intermittent breaks needed to allow for sx to subside.   PATIENT EDUCATION: Education details: See self-care above, continue HEP and addition of seated VOR cancellation and purpose of these exercises  Person educated: Patient Education method: Explanation, Demonstration, Verbal cues, and Handouts Education comprehension: verbalized understanding, returned demonstration, and  needs further education  HOME EXERCISE PROGRAM: Bending / Picking Up Objects   Sitting, slowly bend head down and pick up object on the floor. Return to upright position. Hold position until symptoms subside. Repeat __10__ times per session. Do __3__ sessions per day.   Head Motion: Side to Side   Sitting, tilt head down 15-30, slowly move head to right with eyes open. Hold position until symptoms subside. Then, move head slowly to opposite side. Hold position until symptoms subside. Repeat __10__ times per session. Do __3__ sessions per day.   Gaze Stabilization: Sitting   Keeping eyes on target on wall a few feet away, tilt head down 15-30 and move head side to side for 30 seconds. Repeat while moving head up and down for 30 seconds. Do 2 session per day for 3 rounds.  Access Code: [email or web address] URL: [email or web address] Date: 12/29/2023 Prepared by: Sheffield Senate  Exercises - Seated VOR Cancellation  - 1-2 x daily - 5 x weekly - 2 sets - 10 reps  GOALS: Goals reviewed with patient? Yes  SHORT TERM GOALS: ALL STGS = LTGS   LONG TERM GOALS: Target date: 01/11/2024  Pt will be independent with final HEP in regards to balance/vestibular deficits.  Baseline:  Goal status: INITIAL  2.  Pt will report items on MSQ as 2/5 or less in order to demo decr motion sensitivity.  Baseline: see chart on 10/8 Goal status: INITIAL  3.  mCTSIB will improve from 17 seconds foam EC to 30 seconds foam EC in order to improve balance and decrease risk  for falls. Baseline: see chart of 12/19/23 Goal status: REVISED  4.  Patient will improve DHI score to </= 36 to demonstrate reduced symptoms  Baseline: 52 Goal status: REVISED   ASSESSMENT:  CLINICAL IMPRESSION: Assessed BP in sitting/standing at start of session with pt with low BP (see above), but pt not reporting any symptoms. Provided pt with water as she did not have much this morning and initially started session in  sitting working on VOR x1 and VOR cancellation with a busy background. Pt reporting feeling mild dizziness with VOR cancellation tasks - added to pt's HEP. Pt's BP did improve and was more WNL to safely participate in standing tasks, so remainder of session focused on balance for vestibular input on air ex. Pt did report some nausea, but able to tolerate well. Will continue per POC.    OBJECTIVE IMPAIRMENTS: Abnormal gait, decreased activity tolerance, decreased balance, decreased endurance, decreased mobility, difficulty walking, decreased ROM, dizziness, postural dysfunction, and pain.   ACTIVITY LIMITATIONS: lifting, bending, squatting, stairs, dressing, hygiene/grooming, locomotion level, and caring for others  PARTICIPATION LIMITATIONS: meal prep, cleaning, laundry, driving, and community activity  PERSONAL FACTORS: Behavior pattern, Past/current experiences, Time since onset of injury/illness/exacerbation, and 3+ comorbidities: Left sided occipital neuralgia, vestibular migraine, anxiety, anemia, osteoporosis, PTSD, chronic dizziness, SLE and antiphospholipid antibody syndrome  are also affecting patient's functional outcome.   REHAB POTENTIAL: Good  CLINICAL DECISION MAKING: Evolving/moderate complexity  EVALUATION COMPLEXITY: Moderate   PLAN:  PT FREQUENCY: 2x/week  PT DURATION: 4 weeks  PLANNED INTERVENTIONS: [id number]- PT Re-evaluation, 97110-Therapeutic exercises, 97530- Therapeutic activity, [id number]- Neuromuscular re-education, 97535- Self Care, [id number]- Manual therapy, [phone]- Gait training, [phone]- Canalith repositioning, Patient/Family education, Balance training, Stair training, Vestibular training, and DME instructions  PLAN FOR NEXT SESSION: ASSESS BP!  Continue to advance bending tasks including spot it cards but can change background or add balance component depending on symptoms. Introduce brandt daroff. Progress VOR to standing, work on vestibular input for balance   Sheffield LOISE Senate, PT, DPT 12/29/2023, 10:05 AM

## 2024-01-02 ENCOUNTER — Ambulatory Visit: Admitting: Physical Therapy

## 2024-01-02 ENCOUNTER — Encounter: Payer: Self-pay | Admitting: Physical Therapy

## 2024-01-02 ENCOUNTER — Encounter (HOSPITAL_COMMUNITY): Payer: Self-pay | Admitting: Internal Medicine

## 2024-01-02 VITALS — BP 108/79 | HR 82

## 2024-01-02 DIAGNOSIS — M25561 Pain in right knee: Secondary | ICD-10-CM | POA: Diagnosis not present

## 2024-01-02 DIAGNOSIS — R2689 Other abnormalities of gait and mobility: Secondary | ICD-10-CM

## 2024-01-02 DIAGNOSIS — R2681 Unsteadiness on feet: Secondary | ICD-10-CM

## 2024-01-02 DIAGNOSIS — R42 Dizziness and giddiness: Secondary | ICD-10-CM

## 2024-01-02 NOTE — Therapy (Signed)
 OUTPATIENT PHYSICAL THERAPY VESTIBULAR TREATMENT     Patient Name: Melissa Gaines MRN: [id number] DOB:08-Sep-1970, 53 y.o., female Today's Date: 01/02/2024  END OF SESSION:  PT End of Session - 01/02/24 0933     Visit Number 6    Number of Visits 9    Date for Recertification  01/13/24    Authorization Type [employer]ARE    PT Start Time [id number]    PT Stop Time 1010    PT Time Calculation (min) 38 min    Equipment Utilized During Treatment Gait belt    Activity Tolerance Patient tolerated treatment well    Behavior During Therapy WFL for tasks assessed/performed             Past Medical History:  Diagnosis Date   Anemia    Anxiety    Arthritis    Asthma    Cancer (HCC)    skin cancer   Chronic pain    Complication of anesthesia    Asthma attack during colonoscopy and EGD   Congenital hip dislocation    Depression    GERD (gastroesophageal reflux disease)    Hx pulmonary embolism 07/08/2017   Iron deficiency anemia 04/14/2018   Kidney mass 2017   Migraine    Osteoporosis    Pathological dislocation of shoulder joint, bilateral    congential   Pneumonia    as a child   PTSD (post-traumatic stress disorder)    Sleep apnea    Hx. of no CPAP   Systemic lupus erythematosus (HCC)    Tuberculosis    as a child was treated   Urticaria    Past Surgical History:  Procedure Laterality Date   CESAREAN SECTION  2005   CESAREAN SECTION W/BTL  2010   COLONOSCOPY N/A 09/15/2023   Procedure: COLONOSCOPY;  Surgeon: Leigh Elspeth SQUIBB, MD;  Location: [facility];  Service: Gastroenterology;  Laterality: N/A;   ESOPHAGOGASTRODUODENOSCOPY N/A 09/15/2023   Procedure: EGD (ESOPHAGOGASTRODUODENOSCOPY);  Surgeon: Leigh Elspeth SQUIBB, MD;  Location: [facility];  Service: Gastroenterology;  Laterality: N/A;   HIP SURGERY     in childhood for congenital hip dislocation   LAPAROSCOPIC GASTRIC SLEEVE RESECTION  2014   POLYPECTOMY  09/15/2023    Procedure: POLYPECTOMY, INTESTINE;  Surgeon: Leigh Elspeth SQUIBB, MD;  Location: [facility];  Service: Gastroenterology;;   HARLEY DILATION N/A 09/15/2023   Procedure: EGD, WITH DILATION USING SAVARY-GILLIARD DILATOR OVER GUIDEWIRE;  Surgeon: Leigh Elspeth SQUIBB, MD;  Location: [facility];  Service: Gastroenterology;  Laterality: N/A;   STAPEDECTOMY  11/01/2011   L postauricular stapedectomy with CO2 laser and insertion of 6 x 4.75 mm SMart Piston   TOTAL HIP ARTHROPLASTY     05/2000 R, 11/2000 L   Patient Active Problem List   Diagnosis Date Noted   Hiatal hernia 11/16/2023   Small bowel obstruction (HCC) 10/30/2023   Chronic health problem 10/30/2023   Dysphagia 09/15/2023   Benign neoplasm of colon 09/15/2023   B12 deficiency 05/11/2022   Hyperlipidemia 05/07/2022   Prediabetes 05/07/2022   Environmental and seasonal allergies 08/03/2021   History of colonoscopy 07/31/2021   Asthma 07/01/2021   Gastroesophageal reflux disease 08/19/2020   Tinnitus aurium, 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   Lumbar back pain 01/25/2020   Left hip pain 06/26/2019   Muscle strain of lower extremity, left, initial encounter 06/26/2019   Iron deficiency anemia 04/14/2018   Anemia 03/29/2018   Hematuria, gross 03/29/2018   Antiphospholipid antibody syndrome 01/25/2018   Morbid obesity (HCC) 07/26/2017  Systemic lupus erythematosus (HCC) 07/08/2017   Chronic pain of right knee 05/23/2017   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/21/2016   Vitamin D  deficiency 12/16/2016   Migraine     PCP: McDiarmid, Krystal BIRCH, MD REFERRING PROVIDER: McDiarmid, Krystal BIRCH, MD  REFERRING DIAG: R42 (ICD-10-CM) - Dizziness  THERAPY DIAG:  Dizziness and giddiness  Unsteadiness on feet  Other abnormalities of gait and mobility  ONSET DATE: 11/16/2023  Rationale for Evaluation and Treatment: Rehabilitation  SUBJECTIVE:   SUBJECTIVE STATEMENT:  Nothing new. Reports has not really noticed a change in her dizziness since starting therapy. Gets  more dizziness when traveling in the car.   Pt accompanied by: self  PERTINENT HISTORY: PMH: Left sided occipital neuralgia, vestibular migraine, anxiety, anemia, osteoporosis, PTSD, chronic dizziness, SLE and antiphospholipid antibody syndrome   Per PCP: Her surgeon took her off of Ubrelvy  due to the nausea.  She hasn't been taking anything for migraines. Frequency:  2-3 days a month. Dizziness occurs intermittent, usually 2 a month but not with the headache.  She did have a headache with vertigo on 11/14/2023 for which she was seen in the ED.  She still has it.    PAIN:  Are you having pain? No and Sinuses are bothering her right now. Takes allergy  medication and that helps sometimes   PRECAUTIONS: Fall   FALLS: Has patient fallen in last 6 months? Yes. Number of falls 3, reports hurt her L knee but then got better with therapy   LIVING ENVIRONMENT: Lives with: lives with her teenage children  Lives in: House/apartment Stairs: Yes: External: 5 or 6 steps; bilateral but cannot reach both Has following equipment at home: Single point cane, Walker - 4 wheeled, and has a cane, but not feel as supportive and feels like she might fall   PLOF: Independent with household mobility without device, Independent with community mobility with device, and Needs assistance with homemaking Can't bend down to put socks on, pt reports due to dislocated hips and dizziness  Has a home aide that helps with cleaning, cooking, getting dressed from the waist down, aide comes daily   PATIENT GOALS: Wants to improve her balance, have more control of her legs, doesn't want to fall   OBJECTIVE:  Note: Objective measures were completed at Evaluation unless otherwise noted.  DIAGNOSTIC FINDINGS: MRI of cervical spine on 05/30/2023 which revealed small right paracentral disc protrusions at C4-5 and C6-7 and additional mild noncompressive disc bulging at C3-4 and C5-6 without stenosis or impingement.   MRI brain  11/21/23: IMPRESSION: Stable 4 x 5 mm focus of nodular enhancing tissue within the infundibulum with a diminutive infundibulum beyond that. 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 Stability since 2022 ensures a benign nature. Craniopharyngioma is essentially excluded. Infundibular metastasis is excluded.  COGNITION: Overall cognitive status: Within functional limits for tasks assessed    Cervical ROM:   pt very guarded with cervical movements    GAIT: Gait pattern: decreased stance time- Right, decreased stride length, antalgic, trendelenburg, lateral lean- Right, and trunk flexed Distance walked: Clinic distances  Assistive device utilized: None Level of assistance: Modified independence   VESTIBULAR ASSESSMENT:  GENERAL OBSERVATION: Ambulates in with no AD, antalgic gait pattern with trunk lean to R   SYMPTOM BEHAVIOR:  Subjective history: See above   Non-Vestibular symptoms: changes in vision, headaches, migraine symptoms, and pt reporting has blurred vision and has seen an eye doctor and reports she has cataracts, does not wear  glasses as she feels it makes it worse   Type of dizziness: Imbalance (Disequilibrium) and Spinning/Vertigo  Frequency: Comes and goes, feels like episodes are worse when her allergies are worse   Duration: ~5 minutes if she has episodes   Aggravating factors: Induced by motion: turning head quickly and sitting up and down, being in a car for too long and head will start to spin   Relieving factors: dark room, closing eyes, and putting cold water on her face   Progression of symptoms: better  OCULOMOTOR EXAM:  Ocular Alignment: normal  Ocular ROM: No Limitations  Spontaneous Nystagmus: absent  Gaze-Induced Nystagmus: absent  Smooth Pursuits: intact and reports this bothered her eyes, more blinking, some jumping going through midline   Saccades: extra eye movements and ~2 saccadic beats  in horizontal direction, incr dizziness when going back to the center, ~2 saccadic beats in vertical direction, did not cause dizziness  Notes brighter lights in tx room was triggering for a headache, so turned down one of the bright lights    VESTIBULAR - OCULAR REFLEX:   Slow VOR: pt very guarded, does not let PT move her head even slowly, PT tried multiple times, when asked to move her head pt can move it [facility] and reports it makes her dizzy   VOR Cancellation: unable to test due to guarding   Head-Impulse Test: unable to test due to guarding   MOTION SENSITIVITY:  Motion Sensitivity Quotient Intensity: 0 = none, 1 = Lightheaded, 2 = Mild, 3 = Moderate, 4 = Severe, 5 = Vomiting  Intensity  1. Sitting to supine 0  2. Supine to L side 0  3. Supine to R side 0  4. Supine to sitting 1  5. L Sidelying (modified dix) 0  6. Up from L  1  7. R Sidelying (modified dix) 0  8. Up from R  1  9. Sitting, head tipped to L knee 3-4, 12/19/23: 0  10. Head up from L knee 0  11. Sitting, head tipped to R knee 3-4, 12/19/23: 0  12. Head up from R knee 0  13. Sitting head turns x5 1-2 (reports if she had to do more head turns, would be unable to do it)  14.Sitting head nods x5 2-3 (felt like it was going to get worse the more she did)  15. In stance, 180 turn to L  5 (not vomiting), 12/19/23: 3  16. In stance, 180 turn to R Pt sat down immediately after turning to the L and unable to try going the other direction, 12/19/23: 3                                                                              M-CTSIB  Condition 1: Firm Surface, EO 30 Sec, Normal Sway  Condition 2: Firm Surface, EC 30 Sec, Normal Sway, felt unsteady   Condition 3: Foam Surface, EO 30 Sec, Normal Sway, felt unsteady  Condition 4: Foam Surface, EC Attempt 1: 5 Sec, Attempt 2: 29 seconds, Moderate-severe sway, felt very unsteady  TREATMENT DATE: 01/02/24  Vitals:    01/02/24 0937  BP: 108/79  Pulse: 82    Assessed in seated   NMR Gaze Adaptation: x1 Viewing Horizontal: Position: Standing, Time: 30 seconds, Reps: 2, and Comment: pt reporting a little dizziness  x1 Viewing Vertical:  Position: Standing, Time: 30 seconds, Reps: 2, and Comment: pt reporting a weird feeling afterwards   Sitting on green physioball for improved otolith function: Bouncing for 15 seconds and adding in head turns 2 x 5 reps (moderate dizziness), and adding in head nods, 2 x 5 reps (moderate dizziness)  With Shiela Chart standing on air ex: In horizontal direction: 2 sets of 5 lines, beginning with wider BOS> feet hip width, pt reporting mild dizziness  In vertical direction: 2 sets of 5 lines, beginning with feet hip width, pt reporting mild dizziness, easier to lose focus with this compared to horizontal and had to find her spot a few times, pt with improvements the 2nd time   Alternating forward stepping with head turns to R/L 2 sets of 10 reps, trying to perform without UE support, pt reporting this made her head felt worse   On air ex: 2 x 5 reps sit <> stands with EC   Intermittent breaks needed to allow for sx to subside.   PATIENT EDUCATION: Education details: progressing VOR x1 to standing for HEP  Person educated: Patient Education method: Explanation, Demonstration, and Verbal cues Education comprehension: verbalized understanding, returned demonstration, and needs further education  HOME EXERCISE PROGRAM: Bending / Picking Up Objects   Sitting, slowly bend head down and pick up object on the floor. Return to upright position. Hold position until symptoms subside. Repeat __10__ times per session. Do __3__ sessions per day.   Head Motion: Side to Side   Sitting, tilt head down 15-30, slowly move head to right with eyes open. Hold position until symptoms subside. Then, move head slowly to opposite side. Hold position until symptoms subside. Repeat __10__  times per session. Do __3__ sessions per day.   Standing VOR x 1 in Horizontal and Vertical Direction 30 seconds  Access Code: [email or web address] URL: [email or web address] Date: 12/29/2023 Prepared by: Sheffield Senate  Exercises - Seated VOR Cancellation  - 1-2 x daily - 5 x weekly - 2 sets - 10 reps   GOALS: Goals reviewed with patient? Yes  SHORT TERM GOALS: ALL STGS = LTGS   LONG TERM GOALS: Target date: 01/11/2024  Pt will be independent with final HEP in regards to balance/vestibular deficits.  Baseline:  Goal status: INITIAL  2.  Pt will report items on MSQ as 2/5 or less in order to demo decr motion sensitivity.  Baseline: see chart on 10/8 Goal status: INITIAL  3.  mCTSIB will improve from 17 seconds foam EC to 30 seconds foam EC in order to improve balance and decrease risk for falls. Baseline: see chart of 12/19/23 Goal status: REVISED  4.  Patient will improve DHI score to </= 36 to demonstrate reduced symptoms  Baseline: 52 Goal status: REVISED   ASSESSMENT:  CLINICAL IMPRESSION: Assessed BP and WNL for therapy. Progressed VOR x1 to standing on HEP with pt reporting mild dizziness. Worked on standing saccade tasks with Shiela Chart and pt reporting mild dizziness and did initially have more difficulty in the vertical direction. Pt needing intermittent rest breaks to allow sx to subside. Pt most challenged by alternating stepping with head turns R/L as this made her head hurt after. Will continue per POC.  OBJECTIVE IMPAIRMENTS: Abnormal gait, decreased activity tolerance, decreased balance, decreased endurance, decreased mobility, difficulty walking, decreased ROM, dizziness, postural dysfunction, and pain.   ACTIVITY LIMITATIONS: lifting, bending, squatting, stairs, dressing, hygiene/grooming, locomotion level, and caring for others  PARTICIPATION LIMITATIONS: meal prep, cleaning, laundry, driving, and community activity  PERSONAL FACTORS: Behavior  pattern, Past/current experiences, Time since onset of injury/illness/exacerbation, and 3+ comorbidities: Left sided occipital neuralgia, vestibular migraine, anxiety, anemia, osteoporosis, PTSD, chronic dizziness, SLE and antiphospholipid antibody syndrome  are also affecting patient's functional outcome.   REHAB POTENTIAL: Good  CLINICAL DECISION MAKING: Evolving/moderate complexity  EVALUATION COMPLEXITY: Moderate   PLAN:  PT FREQUENCY: 2x/week  PT DURATION: 4 weeks  PLANNED INTERVENTIONS: [id number]- PT Re-evaluation, 97110-Therapeutic exercises, 97530- Therapeutic activity, [id number]- Neuromuscular re-education, 97535- Self Care, [id number]- Manual therapy, [phone]- Gait training, [phone]- Canalith repositioning, Patient/Family education, Balance training, Stair training, Vestibular training, and DME instructions  PLAN FOR NEXT SESSION: ASSESS BP!   Continue to advance bending tasks including spot it cards but can change background or add balance component depending on symptoms. Introduce brandt daroff. Progress VOR tasks to busy background in standing, work on vestibular input for balance with head motions, work on saccades   Sheffield LOISE Senate, PT, DPT 01/02/2024, 10:13 AM

## 2024-01-03 ENCOUNTER — Ambulatory Visit: Admitting: Nurse Practitioner

## 2024-01-05 ENCOUNTER — Ambulatory Visit

## 2024-01-05 VITALS — BP 101/73 | HR 73

## 2024-01-05 DIAGNOSIS — R2681 Unsteadiness on feet: Secondary | ICD-10-CM

## 2024-01-05 DIAGNOSIS — M25561 Pain in right knee: Secondary | ICD-10-CM | POA: Diagnosis not present

## 2024-01-05 DIAGNOSIS — R42 Dizziness and giddiness: Secondary | ICD-10-CM

## 2024-01-05 DIAGNOSIS — R2689 Other abnormalities of gait and mobility: Secondary | ICD-10-CM

## 2024-01-05 NOTE — Therapy (Signed)
 OUTPATIENT PHYSICAL THERAPY VESTIBULAR TREATMENT     Patient Name: Melissa Gaines MRN: [id number] DOB:12-Dec-1970, 53 y.o., female Today's Date: 01/05/2024  END OF SESSION:  PT End of Session - 01/05/24 1029     Visit Number 7    Number of Visits 9    Date for Recertification  01/13/24    Authorization Type [employer]    PT Start Time [phone]    PT Stop Time 1017    PT Time Calculation (min) 46 min    Equipment Utilized During Treatment Gait belt    Activity Tolerance Patient tolerated treatment well    Behavior During Therapy WFL for tasks assessed/performed              Past Medical History:  Diagnosis Date   Anemia    Anxiety    Arthritis    Asthma    Cancer (HCC)    skin cancer   Chronic pain    Complication of anesthesia    Asthma attack during colonoscopy and EGD   Congenital hip dislocation    Depression    GERD (gastroesophageal reflux disease)    Hx pulmonary embolism 07/08/2017   Iron deficiency anemia 04/14/2018   Kidney mass 2017   Migraine    Osteoporosis    Pathological dislocation of shoulder joint, bilateral    congential   Pneumonia    as a child   PTSD (post-traumatic stress disorder)    Sleep apnea    Hx. of no CPAP   Systemic lupus erythematosus (HCC)    Tuberculosis    as a child was treated   Urticaria    Past Surgical History:  Procedure Laterality Date   CESAREAN SECTION  2005   CESAREAN SECTION W/BTL  2010   COLONOSCOPY N/A 09/15/2023   Procedure: COLONOSCOPY;  Surgeon: Leigh Elspeth SQUIBB, MD;  Location: [facility];  Service: Gastroenterology;  Laterality: N/A;   ESOPHAGOGASTRODUODENOSCOPY N/A 09/15/2023   Procedure: EGD (ESOPHAGOGASTRODUODENOSCOPY);  Surgeon: Leigh Elspeth SQUIBB, MD;  Location: [facility];  Service: Gastroenterology;  Laterality: N/A;   HIP SURGERY     in childhood for congenital hip dislocation   LAPAROSCOPIC GASTRIC SLEEVE RESECTION  2014   POLYPECTOMY  09/15/2023    Procedure: POLYPECTOMY, INTESTINE;  Surgeon: Leigh Elspeth SQUIBB, MD;  Location: [facility];  Service: Gastroenterology;;   HARLEY DILATION N/A 09/15/2023   Procedure: EGD, WITH DILATION USING SAVARY-GILLIARD DILATOR OVER GUIDEWIRE;  Surgeon: Leigh Elspeth SQUIBB, MD;  Location: [facility];  Service: Gastroenterology;  Laterality: N/A;   STAPEDECTOMY  11/01/2011   L postauricular stapedectomy with CO2 laser and insertion of 6 x 4.75 mm SMart Piston   TOTAL HIP ARTHROPLASTY     05/2000 R, 11/2000 L   Patient Active Problem List   Diagnosis Date Noted   Hiatal hernia 11/16/2023   Small bowel obstruction (HCC) 10/30/2023   Chronic health problem 10/30/2023   Dysphagia 09/15/2023   Benign neoplasm of colon 09/15/2023   B12 deficiency 05/11/2022   Hyperlipidemia 05/07/2022   Prediabetes 05/07/2022   Environmental and seasonal allergies 08/03/2021   History of colonoscopy 07/31/2021   Asthma 07/01/2021   Gastroesophageal reflux disease 08/19/2020   Tinnitus aurium, 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   Lumbar back pain 01/25/2020   Left hip pain 06/26/2019   Muscle strain of lower extremity, left, initial encounter 06/26/2019   Iron deficiency anemia 04/14/2018   Anemia 03/29/2018   Hematuria, gross 03/29/2018   Antiphospholipid antibody syndrome 01/25/2018   Morbid obesity (HCC)  07/26/2017   Systemic lupus erythematosus (HCC) 07/08/2017   Chronic pain of right knee 05/23/2017   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/21/2016   Vitamin D  deficiency 12/16/2016   Migraine     PCP: McDiarmid, Krystal BIRCH, MD REFERRING PROVIDER: McDiarmid, Krystal BIRCH, MD  REFERRING DIAG: R42 (ICD-10-CM) - Dizziness  THERAPY DIAG:  Dizziness and giddiness  Unsteadiness on feet  Other abnormalities of gait and mobility  ONSET DATE: 11/16/2023  Rationale for Evaluation and Treatment: Rehabilitation  SUBJECTIVE:   SUBJECTIVE STATEMENT: Feeling a little dizzy from the ride over here with transportation, they had a new driver and drove  all around. Didn't wake up with dizziness today and hasn't had dizziness in morning for a few days but thinks that the ride over made her dizzy. Patient states headaches are undercontrol currently.   Pt accompanied by: self  PERTINENT HISTORY: PMH: Left sided occipital neuralgia, vestibular migraine, anxiety, anemia, osteoporosis, PTSD, chronic dizziness, SLE and antiphospholipid antibody syndrome   Per PCP: Her surgeon took her off of Ubrelvy  due to the nausea.  She hasn't been taking anything for migraines. Frequency:  2-3 days a month. Dizziness occurs intermittent, usually 2 a month but not with the headache.  She did have a headache with vertigo on 11/14/2023 for which she was seen in the ED.  She still has it.    PAIN:  Are you having pain? No and Sinuses are bothering her right now. Takes allergy  medication and that helps sometimes   PRECAUTIONS: Fall   FALLS: Has patient fallen in last 6 months? Yes. Number of falls 3, reports hurt her L knee but then got better with therapy   LIVING ENVIRONMENT: Lives with: lives with her teenage children  Lives in: House/apartment Stairs: Yes: External: 5 or 6 steps; bilateral but cannot reach both Has following equipment at home: Single point cane, Walker - 4 wheeled, and has a cane, but not feel as supportive and feels like she might fall   PLOF: Independent with household mobility without device, Independent with community mobility with device, and Needs assistance with homemaking Can't bend down to put socks on, pt reports due to dislocated hips and dizziness  Has a home aide that helps with cleaning, cooking, getting dressed from the waist down, aide comes daily   PATIENT GOALS: Wants to improve her balance, have more control of her legs, doesn't want to fall   OBJECTIVE:  Note: Objective measures were completed at Evaluation unless otherwise noted.  DIAGNOSTIC FINDINGS: MRI of cervical spine on 05/30/2023 which revealed small right  paracentral disc protrusions at C4-5 and C6-7 and additional mild noncompressive disc bulging at C3-4 and C5-6 without stenosis or impingement.   MRI brain 11/21/23: IMPRESSION: Stable 4 x 5 mm focus of nodular enhancing tissue within the infundibulum with a diminutive infundibulum beyond that. 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 Stability since 2022 ensures a benign nature. Craniopharyngioma is essentially excluded. Infundibular metastasis is excluded.  COGNITION: Overall cognitive status: Within functional limits for tasks assessed    Cervical ROM:   pt very guarded with cervical movements    GAIT: Gait pattern: decreased stance time- Right, decreased stride length, antalgic, trendelenburg, lateral lean- Right, and trunk flexed Distance walked: Clinic distances  Assistive device utilized: None Level of assistance: Modified independence   VESTIBULAR ASSESSMENT:  GENERAL OBSERVATION: Ambulates in with no AD, antalgic gait pattern with trunk lean to R   SYMPTOM BEHAVIOR:  Subjective history: See above  Non-Vestibular symptoms: changes in vision, headaches, migraine symptoms, and pt reporting has blurred vision and has seen an eye doctor and reports she has cataracts, does not wear glasses as she feels it makes it worse   Type of dizziness: Imbalance (Disequilibrium) and Spinning/Vertigo  Frequency: Comes and goes, feels like episodes are worse when her allergies are worse   Duration: ~5 minutes if she has episodes   Aggravating factors: Induced by motion: turning head quickly and sitting up and down, being in a car for too long and head will start to spin   Relieving factors: dark room, closing eyes, and putting cold water on her face   Progression of symptoms: better  OCULOMOTOR EXAM:  Ocular Alignment: normal  Ocular ROM: No Limitations  Spontaneous Nystagmus: absent  Gaze-Induced Nystagmus: absent  Smooth  Pursuits: intact and reports this bothered her eyes, more blinking, some jumping going through midline   Saccades: extra eye movements and ~2 saccadic beats in horizontal direction, incr dizziness when going back to the center, ~2 saccadic beats in vertical direction, did not cause dizziness  Notes brighter lights in tx room was triggering for a headache, so turned down one of the bright lights    VESTIBULAR - OCULAR REFLEX:   Slow VOR: pt very guarded, does not let PT move her head even slowly, PT tried multiple times, when asked to move her head pt can move it [facility] and reports it makes her dizzy   VOR Cancellation: unable to test due to guarding   Head-Impulse Test: unable to test due to guarding   MOTION SENSITIVITY:  Motion Sensitivity Quotient Intensity: 0 = none, 1 = Lightheaded, 2 = Mild, 3 = Moderate, 4 = Severe, 5 = Vomiting  Intensity  1. Sitting to supine 0  2. Supine to L side 0  3. Supine to R side 0  4. Supine to sitting 1  5. L Sidelying (modified dix) 0  6. Up from L  1  7. R Sidelying (modified dix) 0  8. Up from R  1  9. Sitting, head tipped to L knee 3-4, 12/19/23: 0  10. Head up from L knee 0  11. Sitting, head tipped to R knee 3-4, 12/19/23: 0  12. Head up from R knee 0  13. Sitting head turns x5 1-2 (reports if she had to do more head turns, would be unable to do it)  14.Sitting head nods x5 2-3 (felt like it was going to get worse the more she did)  15. In stance, 180 turn to L  5 (not vomiting), 12/19/23: 3  16. In stance, 180 turn to R Pt sat down immediately after turning to the L and unable to try going the other direction, 12/19/23: 3                                                                              M-CTSIB  Condition 1: Firm Surface, EO 30 Sec, Normal Sway  Condition 2: Firm Surface, EC 30 Sec, Normal Sway, felt unsteady   Condition 3: Foam Surface, EO 30 Sec, Normal Sway, felt unsteady  Condition 4: Foam Surface, EC Attempt 1: 5 Sec,  Attempt 2: 29  seconds, Moderate-severe sway, felt very unsteady                                                        TREATMENT DATE: 01/02/24  Vitals:   01/05/24 0936  BP: 101/73  Pulse: 73     Assessed in seated   NMR: Gaze Adaptation: x1 Viewing Horizontal, Position: Standing, 3 sets x 30 seconds Comment: pt reporting a little dizziness  x1 Viewing Vertical, Position: Standing, 2 sets x 30 seconds Comment: pt reporting a little more dizziness and increased nausea after, more compared to horizontal  Sit to stand on air ex, 1 set x 5 reps EO, 2 sets x 5 reps EC with 3-5 second stand at top for vestibular input Sx with increased nausea with EC, sx improved with rest  With [facility] Chart standing on air ex, saccadic eye movement with vestibular input: Horizontal head movement: 2 sets of 5 lines, narrow BOS, pt reporting mild dizziness  Vertical head movement: 1 set of 5 lines, narrow BOS, increased dizziness with vertical more than horizontal with fogginess noted with increased focus  Intermittent breaks needed to allow for sx to subside with cold water provided to help decrease sx.   PATIENT EDUCATION: Education details: continue HEP Person educated: Patient Education method: Programmer, Multimedia, Demonstration, and Verbal cues Education comprehension: verbalized understanding, returned demonstration, and needs further education  HOME EXERCISE PROGRAM: Bending / Picking Up Objects   Sitting, slowly bend head down and pick up object on the floor. Return to upright position. Hold position until symptoms subside. Repeat __10__ times per session. Do __3__ sessions per day.   Head Motion: Side to Side   Sitting, tilt head down 15-30, slowly move head to right with eyes open. Hold position until symptoms subside. Then, move head slowly to opposite side. Hold position until symptoms subside. Repeat __10__ times per session. Do __3__ sessions per day.   Standing VOR x 1 in Horizontal and  Vertical Direction 30 seconds  Access Code: [email or web address] URL: [email or web address] Date: 12/29/2023 Prepared by: Sheffield Senate  Exercises - Seated VOR Cancellation  - 1-2 x daily - 5 x weekly - 2 sets - 10 reps   GOALS: Goals reviewed with patient? Yes  SHORT TERM GOALS: ALL STGS = LTGS   LONG TERM GOALS: Target date: 01/11/2024  Pt will be independent with final HEP in regards to balance/vestibular deficits.  Baseline:  Goal status: MET  2.  Pt will report items on MSQ as 2/5 or less in order to demo decr motion sensitivity.  Baseline: see chart on 10/8 Goal status: INITIAL  3.  mCTSIB will improve from 17 seconds foam EC to 30 seconds foam EC in order to improve balance and decrease risk for falls. Baseline: see chart of 12/19/23 Goal status: REVISED  4.  Patient will improve DHI score to </= 36 to demonstrate reduced symptoms  Baseline: 52 Goal status: REVISED   ASSESSMENT:  CLINICAL IMPRESSION: Session started with higher levels of dizziness due to the ride over and transportation had a new driver. Assessed BP, was improved today and still WNL for treatment. Treatment today consisted of continuing VOR in standing with noted increase in symptoms with vertical more than horizontal head movement. Sit to stand with vestibular input was also continued with increased time in standing with  EC. Saccadic eye movement was also addressed through use of Hart chart and was well tolerated with noted increased of symptoms with vertical head movement more than horizontal. Will continue progressing treatment as tolerated. Session was well tolerated with mild increase in symptoms which improved with rest and cold water. Will continue per POC.    OBJECTIVE IMPAIRMENTS: Abnormal gait, decreased activity tolerance, decreased balance, decreased endurance, decreased mobility, difficulty walking, decreased ROM, dizziness, postural dysfunction, and pain.   ACTIVITY LIMITATIONS:  lifting, bending, squatting, stairs, dressing, hygiene/grooming, locomotion level, and caring for others  PARTICIPATION LIMITATIONS: meal prep, cleaning, laundry, driving, and community activity  PERSONAL FACTORS: Behavior pattern, Past/current experiences, Time since onset of injury/illness/exacerbation, and 3+ comorbidities: Left sided occipital neuralgia, vestibular migraine, anxiety, anemia, osteoporosis, PTSD, chronic dizziness, SLE and antiphospholipid antibody syndrome  are also affecting patient's functional outcome.   REHAB POTENTIAL: Good  CLINICAL DECISION MAKING: Evolving/moderate complexity  EVALUATION COMPLEXITY: Moderate   PLAN:  PT FREQUENCY: 2x/week  PT DURATION: 4 weeks  PLANNED INTERVENTIONS: [id number]- PT Re-evaluation, 97110-Therapeutic exercises, 97530- Therapeutic activity, [id number]- Neuromuscular re-education, 97535- Self Care, [id number]- Manual therapy, [phone]- Gait training, [phone]- Canalith repositioning, Patient/Family education, Balance training, Stair training, Vestibular training, and DME instructions  PLAN FOR NEXT SESSION: ASSESS BP!   Continue to advance bending tasks including spot it cards but can change background or add balance component depending on symptoms. Introduce brandt daroff. Progress VOR tasks to busy background in standing, work on vestibular input for balance with head motions, work on saccades  Continue bending habituation tasks, visual input driving sim with compliant surface.  Emmalene Sherry, Student-PT, DPT 01/05/2024, 10:57 AM

## 2024-01-06 ENCOUNTER — Other Ambulatory Visit: Payer: Self-pay | Admitting: Medical Genetics

## 2024-01-06 DIAGNOSIS — Z006 Encounter for examination for normal comparison and control in clinical research program: Secondary | ICD-10-CM

## 2024-01-09 ENCOUNTER — Ambulatory Visit: Attending: Family Medicine | Admitting: Physical Therapy

## 2024-01-09 ENCOUNTER — Encounter: Payer: Self-pay | Admitting: Physical Therapy

## 2024-01-09 VITALS — BP 96/72 | HR 68

## 2024-01-09 DIAGNOSIS — R42 Dizziness and giddiness: Secondary | ICD-10-CM | POA: Diagnosis present

## 2024-01-09 DIAGNOSIS — R2689 Other abnormalities of gait and mobility: Secondary | ICD-10-CM | POA: Insufficient documentation

## 2024-01-09 DIAGNOSIS — R2681 Unsteadiness on feet: Secondary | ICD-10-CM | POA: Insufficient documentation

## 2024-01-09 DIAGNOSIS — R293 Abnormal posture: Secondary | ICD-10-CM | POA: Insufficient documentation

## 2024-01-09 NOTE — Therapy (Signed)
 OUTPATIENT PHYSICAL THERAPY VESTIBULAR TREATMENT     Patient Name: Melissa Gaines MRN: [id number] DOB:10-09-1970, 53 y.o., female Today's Date: 01/09/2024  END OF SESSION:  PT End of Session - 01/09/24 0933     Visit Number 8    Number of Visits 9    Date for Recertification  01/13/24    Authorization Type [employer]    PT Start Time [phone]    PT Stop Time 1012    PT Time Calculation (min) 41 min    Equipment Utilized During Treatment Gait belt    Activity Tolerance Patient tolerated treatment well    Behavior During Therapy WFL for tasks assessed/performed              Past Medical History:  Diagnosis Date   Anemia    Anxiety    Arthritis    Asthma    Cancer (HCC)    skin cancer   Chronic pain    Complication of anesthesia    Asthma attack during colonoscopy and EGD   Congenital hip dislocation    Depression    GERD (gastroesophageal reflux disease)    Hx pulmonary embolism 07/08/2017   Iron deficiency anemia 04/14/2018   Kidney mass 2017   Migraine    Osteoporosis    Pathological dislocation of shoulder joint, bilateral    congential   Pneumonia    as a child   PTSD (post-traumatic stress disorder)    Sleep apnea    Hx. of no CPAP   Systemic lupus erythematosus (HCC)    Tuberculosis    as a child was treated   Urticaria    Past Surgical History:  Procedure Laterality Date   CESAREAN SECTION  2005   CESAREAN SECTION W/BTL  2010   COLONOSCOPY N/A 09/15/2023   Procedure: COLONOSCOPY;  Surgeon: Leigh Elspeth SQUIBB, MD;  Location: [facility];  Service: Gastroenterology;  Laterality: N/A;   ESOPHAGOGASTRODUODENOSCOPY N/A 09/15/2023   Procedure: EGD (ESOPHAGOGASTRODUODENOSCOPY);  Surgeon: Leigh Elspeth SQUIBB, MD;  Location: [facility];  Service: Gastroenterology;  Laterality: N/A;   HIP SURGERY     in childhood for congenital hip dislocation   LAPAROSCOPIC GASTRIC SLEEVE RESECTION  2014   POLYPECTOMY  09/15/2023    Procedure: POLYPECTOMY, INTESTINE;  Surgeon: Leigh Elspeth SQUIBB, MD;  Location: [facility];  Service: Gastroenterology;;   HARLEY DILATION N/A 09/15/2023   Procedure: EGD, WITH DILATION USING SAVARY-GILLIARD DILATOR OVER GUIDEWIRE;  Surgeon: Leigh Elspeth SQUIBB, MD;  Location: [facility];  Service: Gastroenterology;  Laterality: N/A;   STAPEDECTOMY  11/01/2011   L postauricular stapedectomy with CO2 laser and insertion of 6 x 4.75 mm SMart Piston   TOTAL HIP ARTHROPLASTY     05/2000 R, 11/2000 L   Patient Active Problem List   Diagnosis Date Noted   Hiatal hernia 11/16/2023   Small bowel obstruction (HCC) 10/30/2023   Chronic health problem 10/30/2023   Dysphagia 09/15/2023   Benign neoplasm of colon 09/15/2023   B12 deficiency 05/11/2022   Hyperlipidemia 05/07/2022   Prediabetes 05/07/2022   Environmental and seasonal allergies 08/03/2021   History of colonoscopy 07/31/2021   Asthma 07/01/2021   Gastroesophageal reflux disease 08/19/2020   Tinnitus aurium, 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   Lumbar back pain 01/25/2020   Left hip pain 06/26/2019   Muscle strain of lower extremity, left, initial encounter 06/26/2019   Iron deficiency anemia 04/14/2018   Anemia 03/29/2018   Hematuria, gross 03/29/2018   Antiphospholipid antibody syndrome 01/25/2018   Morbid obesity (HCC)  07/26/2017   Systemic lupus erythematosus (HCC) 07/08/2017   Chronic pain of right knee 05/23/2017   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/21/2016   Vitamin D  deficiency 12/16/2016   Migraine     PCP: McDiarmid, Krystal BIRCH, MD REFERRING PROVIDER: McDiarmid, Krystal BIRCH, MD  REFERRING DIAG: R42 (ICD-10-CM) - Dizziness  THERAPY DIAG:  Dizziness and giddiness  Unsteadiness on feet  Other abnormalities of gait and mobility  ONSET DATE: 11/16/2023  Rationale for Evaluation and Treatment: Rehabilitation  SUBJECTIVE:   SUBJECTIVE STATEMENT: Pt reporting that she feels the same and that the dizziness has not changed. Balance feels the same.  Reports has always done the exercises already at home.   Pt accompanied by: self  PERTINENT HISTORY: PMH: Left sided occipital neuralgia, vestibular migraine, anxiety, anemia, osteoporosis, PTSD, chronic dizziness, SLE and antiphospholipid antibody syndrome   Per PCP: Her surgeon took her off of Ubrelvy  due to the nausea.  She hasn't been taking anything for migraines. Frequency:  2-3 days a month. Dizziness occurs intermittent, usually 2 a month but not with the headache.  She did have a headache with vertigo on 11/14/2023 for which she was seen in the ED.  She still has it.    PAIN:  Are you having pain? No and Sinuses are bothering her right now. Takes allergy  medication and that helps sometimes   PRECAUTIONS: Fall   FALLS: Has patient fallen in last 6 months? Yes. Number of falls 3, reports hurt her L knee but then got better with therapy   LIVING ENVIRONMENT: Lives with: lives with her teenage children  Lives in: House/apartment Stairs: Yes: External: 5 or 6 steps; bilateral but cannot reach both Has following equipment at home: Single point cane, Walker - 4 wheeled, and has a cane, but not feel as supportive and feels like she might fall   PLOF: Independent with household mobility without device, Independent with community mobility with device, and Needs assistance with homemaking Can't bend down to put socks on, pt reports due to dislocated hips and dizziness  Has a home aide that helps with cleaning, cooking, getting dressed from the waist down, aide comes daily   PATIENT GOALS: Wants to improve her balance, have more control of her legs, doesn't want to fall   OBJECTIVE:  Note: Objective measures were completed at Evaluation unless otherwise noted.  DIAGNOSTIC FINDINGS: MRI of cervical spine on 05/30/2023 which revealed small right paracentral disc protrusions at C4-5 and C6-7 and additional mild noncompressive disc bulging at C3-4 and C5-6 without stenosis or impingement.    MRI brain 11/21/23: IMPRESSION: Stable 4 x 5 mm focus of nodular enhancing tissue within the infundibulum with a diminutive infundibulum beyond that. 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 Stability since 2022 ensures a benign nature. Craniopharyngioma is essentially excluded. Infundibular metastasis is excluded.  COGNITION: Overall cognitive status: Within functional limits for tasks assessed    Cervical ROM:   pt very guarded with cervical movements    GAIT: Gait pattern: decreased stance time- Right, decreased stride length, antalgic, trendelenburg, lateral lean- Right, and trunk flexed Distance walked: Clinic distances  Assistive device utilized: None Level of assistance: Modified independence   VESTIBULAR ASSESSMENT:  GENERAL OBSERVATION: Ambulates in with no AD, antalgic gait pattern with trunk lean to R   SYMPTOM BEHAVIOR:  Subjective history: See above   Non-Vestibular symptoms: changes in vision, headaches, migraine symptoms, and pt reporting has blurred vision and has seen an eye doctor and reports  she has cataracts, does not wear glasses as she feels it makes it worse   Type of dizziness: Imbalance (Disequilibrium) and Spinning/Vertigo  Frequency: Comes and goes, feels like episodes are worse when her allergies are worse   Duration: ~5 minutes if she has episodes   Aggravating factors: Induced by motion: turning head quickly and sitting up and down, being in a car for too long and head will start to spin   Relieving factors: dark room, closing eyes, and putting cold water on her face   Progression of symptoms: better  OCULOMOTOR EXAM:  Ocular Alignment: normal  Ocular ROM: No Limitations  Spontaneous Nystagmus: absent  Gaze-Induced Nystagmus: absent  Smooth Pursuits: intact and reports this bothered her eyes, more blinking, some jumping going through midline   Saccades: extra eye movements and ~2  saccadic beats in horizontal direction, incr dizziness when going back to the center, ~2 saccadic beats in vertical direction, did not cause dizziness  Notes brighter lights in tx room was triggering for a headache, so turned down one of the bright lights    VESTIBULAR - OCULAR REFLEX:   Slow VOR: pt very guarded, does not let PT move her head even slowly, PT tried multiple times, when asked to move her head pt can move it [facility] and reports it makes her dizzy   VOR Cancellation: unable to test due to guarding   Head-Impulse Test: unable to test due to guarding   MOTION SENSITIVITY:  Motion Sensitivity Quotient Intensity: 0 = none, 1 = Lightheaded, 2 = Mild, 3 = Moderate, 4 = Severe, 5 = Vomiting  Intensity  1. Sitting to supine 0  2. Supine to L side 0  3. Supine to R side 0  4. Supine to sitting 1  5. L Sidelying (modified dix) 0  6. Up from L  1  7. R Sidelying (modified dix) 0  8. Up from R  1  9. Sitting, head tipped to L knee 3-4, 12/19/23: 0  10. Head up from L knee 0  11. Sitting, head tipped to R knee 3-4, 12/19/23: 0  12. Head up from R knee 0  13. Sitting head turns x5 1-2 (reports if she had to do more head turns, would be unable to do it)  14.Sitting head nods x5 2-3 (felt like it was going to get worse the more she did)  15. In stance, 180 turn to L  5 (not vomiting), 12/19/23: 3  16. In stance, 180 turn to R Pt sat down immediately after turning to the L and unable to try going the other direction, 12/19/23: 3                                                                              M-CTSIB  Condition 1: Firm Surface, EO 30 Sec, Normal Sway  Condition 2: Firm Surface, EC 30 Sec, Normal Sway, felt unsteady   Condition 3: Foam Surface, EO 30 Sec, Normal Sway, felt unsteady  Condition 4: Foam Surface, EC Attempt 1: 5 Sec, Attempt 2: 29 seconds, Moderate-severe sway, felt very unsteady  TREATMENT DATE:  01/09/24  Therapeutic Activity:  Vitals:   01/09/24 0939  BP: 96/72  Pulse: 68   Assessed in seated, pt denies lightheadedness  Discussed purpose of vestibular PT and purpose of bringing on dizziness with exercises in order to retrain brain/vestibular system. Educated on POC going forwards - pt has one more visit and pt reports she has not made improvements with PT, and discussed plan to D/C at next session with pt in agreement   NMR: Performed 3 reps of 1 minute of optokinetic driving videos on windy roads due to pt reporting continued sx being in the car with transportation, pt reporting no symptoms with this. Discussed can try this at home to work on sx when pt is in the car  On air ex in corner: Holding ball and tossing in vertical direction 10 reps (no sx), tossing in diagonal to R/L 2 x 10 reps (mild sx) Holding ball and making circles for gaze stabilization, 10 reps, pt reporting no sx, just it bothered her shoulders  EC with wide BOS > feet more narrow 2 x 30 seconds, mild dizziness EC with wide BOS 2 x 5 reps head turns, 2 x 5 reps head nods , pt reporting head feeling like its gonna start spinning when she opens her eyes, reproduces the sx she has when being in a car   With 4 blaze pods on the mirror in superior and inferior directions to work on visual scanning, head movements, habituation to bending, performed on random hit setting with pt standing on air ex  Performed 2 sets of 1 minute: 35 hits, 35 hits  Mild dizziness and feeling imbalanced with brief standing rest break in between   Intermittent breaks needed to allow for sx to subside with cold water provided to help decrease sx.   PATIENT EDUCATION: Education details: purpose of vestibular PT and bringing on dizziness with exercises, added standing balance with EC to HEP for vestibular input  Person educated: Patient Education method: Explanation, Demonstration, Verbal cues, and Handouts Education comprehension:  verbalized understanding, returned demonstration, and needs further education  HOME EXERCISE PROGRAM: Bending / Picking Up Objects   Sitting, slowly bend head down and pick up object on the floor. Return to upright position. Hold position until symptoms subside. Repeat __10__ times per session. Do __3__ sessions per day.   Head Motion: Side to Side   Sitting, tilt head down 15-30, slowly move head to right with eyes open. Hold position until symptoms subside. Then, move head slowly to opposite side. Hold position until symptoms subside. Repeat __10__ times per session. Do __3__ sessions per day.   Standing VOR x 1 in Horizontal and Vertical Direction 30 seconds  Access Code: [email or web address] URL: [email or web address] Date: 01/09/2024 Prepared by: Sheffield Senate  Exercises - Seated VOR Cancellation  - 1-2 x daily - 5 x weekly - 2 sets - 10 reps - Standing Balance with Eyes Closed on Foam  - 1 x daily - 5 x weekly - 3 sets - 30 hold - Wide Stance with Head Rotation on Foam Pad  - 1-2 x daily - 5 x weekly - 2 sets - 5 reps  GOALS: Goals reviewed with patient? Yes  SHORT TERM GOALS: ALL STGS = LTGS   LONG TERM GOALS: Target date: 01/11/2024  Pt will be independent with final HEP in regards to balance/vestibular deficits.  Baseline:  Goal status: MET  2.  Pt will report items on MSQ as 2/5 or less in  order to demo decr motion sensitivity.  Baseline: see chart on 10/8 Goal status: INITIAL  3.  mCTSIB will improve from 17 seconds foam EC to 30 seconds foam EC in order to improve balance and decrease risk for falls. Baseline: see chart of 12/19/23 Goal status: REVISED  4.  Patient will improve DHI score to </= 36 to demonstrate reduced symptoms  Baseline: 52 Goal status: REVISED   ASSESSMENT:  CLINICAL IMPRESSION: Pt reporting not really feeling a change in her dizziness sx since starting PT and has already been familiar with exercises given as HEP. Pt has one  more appt scheduled and discussed D/C with pt in agreement with this plan. Tried optokinetic video exercises to simulate being in the car as this is when pt has more dizziness. No symptoms noted with this in sitting today. Worked on standing balance tasks with vestibular input with EC as this made pt feel dizzy and reproduced her sx of being in a car. Added these to pt's HEP and educated on purpose of bringing on sx and purpose of vestibular PT. Will continue per POC.   OBJECTIVE IMPAIRMENTS: Abnormal gait, decreased activity tolerance, decreased balance, decreased endurance, decreased mobility, difficulty walking, decreased ROM, dizziness, postural dysfunction, and pain.   ACTIVITY LIMITATIONS: lifting, bending, squatting, stairs, dressing, hygiene/grooming, locomotion level, and caring for others  PARTICIPATION LIMITATIONS: meal prep, cleaning, laundry, driving, and community activity  PERSONAL FACTORS: Behavior pattern, Past/current experiences, Time since onset of injury/illness/exacerbation, and 3+ comorbidities: Left sided occipital neuralgia, vestibular migraine, anxiety, anemia, osteoporosis, PTSD, chronic dizziness, SLE and antiphospholipid antibody syndrome  are also affecting patient's functional outcome.   REHAB POTENTIAL: Good  CLINICAL DECISION MAKING: Evolving/moderate complexity  EVALUATION COMPLEXITY: Moderate   PLAN:  PT FREQUENCY: 2x/week  PT DURATION: 4 weeks  PLANNED INTERVENTIONS: [id number]- PT Re-evaluation, 97110-Therapeutic exercises, 97530- Therapeutic activity, [id number]- Neuromuscular re-education, 97535- Self Care, [id number]- Manual therapy, [phone]- Gait training, [phone]- Canalith repositioning, Patient/Family education, Balance training, Stair training, Vestibular training, and DME instructions  PLAN FOR NEXT SESSION: ASSESS BP!   Check goals, plan for D/C   Alejandra Hunt N Michale Emmerich, PT, DPT 01/09/2024, 10:16 AM

## 2024-01-12 ENCOUNTER — Ambulatory Visit: Admitting: Physical Therapy

## 2024-01-12 VITALS — BP 105/78 | HR 79

## 2024-01-12 DIAGNOSIS — R42 Dizziness and giddiness: Secondary | ICD-10-CM

## 2024-01-12 DIAGNOSIS — R293 Abnormal posture: Secondary | ICD-10-CM

## 2024-01-12 DIAGNOSIS — R2681 Unsteadiness on feet: Secondary | ICD-10-CM

## 2024-01-12 NOTE — Therapy (Cosign Needed)
 OUTPATIENT PHYSICAL THERAPY VESTIBULAR DISHCARGE     Patient Name: Melissa Gaines MRN: [id number] DOB:19-Apr-1970, 53 y.o., female Today's Date: 01/13/2024  PHYSICAL THERAPY DISCHARGE SUMMARY  Visits from Start of Care: 9  Current functional level related to goals / functional outcomes: Independent, see clinical impression statement   Remaining deficits: Unsteady gait, dizziness (unchanged by PT), unsteady turning around in standing    Education / Equipment: Continue HEP and reviewed HEP,    Patient agrees to discharge. Patient goals were partially met for mCTSIB and partially met for MSQ and DHI with DHI improving significantly . Patient is being discharged due to reaching max potential and patient requesting due to having better balance but not less dizziness..    END OF SESSION:  PT End of Session - 01/12/24 1017     Visit Number 9    Number of Visits 9    Date for Recertification  01/13/24    Authorization Type [employer]    PT Start Time 0932    PT Stop Time 1014    PT Time Calculation (min) 42 min    Equipment Utilized During Treatment Gait belt    Activity Tolerance Patient tolerated treatment well    Behavior During Therapy WFL for tasks assessed/performed               Past Medical History:  Diagnosis Date   Anemia    Anxiety    Arthritis    Asthma    Cancer (HCC)    skin cancer   Chronic pain    Complication of anesthesia    Asthma attack during colonoscopy and EGD   Congenital hip dislocation    Depression    GERD (gastroesophageal reflux disease)    Hx pulmonary embolism 07/08/2017   Iron deficiency anemia 04/14/2018   Kidney mass 2017   Migraine    Osteoporosis    Pathological dislocation of shoulder joint, bilateral    congential   Pneumonia    as a child   PTSD (post-traumatic stress disorder)    Sleep apnea    Hx. of no CPAP   Systemic lupus erythematosus (HCC)    Tuberculosis    as a child was  treated   Urticaria    Past Surgical History:  Procedure Laterality Date   CESAREAN SECTION  2005   CESAREAN SECTION W/BTL  2010   COLONOSCOPY N/A 09/15/2023   Procedure: COLONOSCOPY;  Surgeon: Leigh Elspeth SQUIBB, MD;  Location: [facility];  Service: Gastroenterology;  Laterality: N/A;   ESOPHAGOGASTRODUODENOSCOPY N/A 09/15/2023   Procedure: EGD (ESOPHAGOGASTRODUODENOSCOPY);  Surgeon: Leigh Elspeth SQUIBB, MD;  Location: [facility];  Service: Gastroenterology;  Laterality: N/A;   HIP SURGERY     in childhood for congenital hip dislocation   LAPAROSCOPIC GASTRIC SLEEVE RESECTION  2014   POLYPECTOMY  09/15/2023   Procedure: POLYPECTOMY, INTESTINE;  Surgeon: Leigh Elspeth SQUIBB, MD;  Location: [facility];  Service: Gastroenterology;;   HARLEY DILATION N/A 09/15/2023   Procedure: EGD, WITH DILATION USING SAVARY-GILLIARD DILATOR OVER GUIDEWIRE;  Surgeon: Leigh Elspeth SQUIBB, MD;  Location: [facility];  Service: Gastroenterology;  Laterality: N/A;   STAPEDECTOMY  11/01/2011   L postauricular stapedectomy with CO2 laser and insertion of 6 x 4.75 mm SMart Piston   TOTAL HIP ARTHROPLASTY     05/2000 R, 11/2000 L   Patient Active Problem List   Diagnosis Date Noted   Hiatal hernia 11/16/2023   Small bowel obstruction (HCC) 10/30/2023   Chronic health problem  10/30/2023   Dysphagia 09/15/2023   Benign neoplasm of colon 09/15/2023   B12 deficiency 05/11/2022   Hyperlipidemia 05/07/2022   Prediabetes 05/07/2022   Environmental and seasonal allergies 08/03/2021   History of colonoscopy 07/31/2021   Asthma 07/01/2021   Gastroesophageal reflux disease 08/19/2020   Tinnitus aurium, 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   Lumbar back pain 01/25/2020   Left hip pain 06/26/2019   Muscle strain of lower extremity, left, initial encounter 06/26/2019   Iron deficiency anemia 04/14/2018   Anemia 03/29/2018   Hematuria, gross 03/29/2018   Antiphospholipid antibody syndrome 01/25/2018   Morbid obesity (HCC)  07/26/2017   Systemic lupus erythematosus (HCC) 07/08/2017   Chronic pain of right knee 05/23/2017   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/21/2016   Vitamin D  deficiency 12/16/2016   Migraine     PCP: McDiarmid, Krystal BIRCH, MD REFERRING PROVIDER: McDiarmid, Krystal BIRCH, MD  REFERRING DIAG: R42 (ICD-10-CM) - Dizziness  THERAPY DIAG:  Dizziness and giddiness  Unsteadiness on feet  Abnormal posture  ONSET DATE: 11/16/2023  Rationale for Evaluation and Treatment: Rehabilitation  SUBJECTIVE:   SUBJECTIVE STATEMENT: Patient ambulates into clinic without assistive device. Patient states that the car ride over here made her dizzy because the driver goes super fast. She currently has mild dizziness of 1-2/5 and a headache of about 1-2/5. She continues to loose her balance at home and has to catch herself.  Pt accompanied by: self  PERTINENT HISTORY: PMH: Left sided occipital neuralgia, vestibular migraine, anxiety, anemia, osteoporosis, PTSD, chronic dizziness, SLE and antiphospholipid antibody syndrome   Per PCP: Her surgeon took her off of Ubrelvy  due to the nausea.  She hasn't been taking anything for migraines. Frequency:  2-3 days a month. Dizziness occurs intermittent, usually 2 a month but not with the headache.  She did have a headache with vertigo on 11/14/2023 for which she was seen in the ED.  She still has it.    PAIN:  Are you having pain? No and Sinuses are bothering her right now. Takes allergy  medication and that helps sometimes   PRECAUTIONS: Fall   FALLS: Has patient fallen in last 6 months? Yes. Number of falls 3, reports hurt her L knee but then got better with therapy   LIVING ENVIRONMENT: Lives with: lives with her teenage children  Lives in: House/apartment Stairs: Yes: External: 5 or 6 steps; bilateral but cannot reach both Has following equipment at home: Single point cane, Walker - 4 wheeled, and has a cane, but not feel as supportive and feels like she might fall   PLOF:  Independent with household mobility without device, Independent with community mobility with device, and Needs assistance with homemaking Can't bend down to put socks on, pt reports due to dislocated hips and dizziness  Has a home aide that helps with cleaning, cooking, getting dressed from the waist down, aide comes daily   PATIENT GOALS: Wants to improve her balance, have more control of her legs, doesn't want to fall   OBJECTIVE:  Note: Objective measures were completed at Evaluation unless otherwise noted.  DIAGNOSTIC FINDINGS: MRI of cervical spine on 05/30/2023 which revealed small right paracentral disc protrusions at C4-5 and C6-7 and additional mild noncompressive disc bulging at C3-4 and C5-6 without stenosis or impingement.   MRI brain 11/21/23: IMPRESSION: Stable 4 x 5 mm focus of nodular enhancing tissue within the infundibulum with a diminutive infundibulum beyond that. 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 Stability since 2022 ensures a benign  nature. Craniopharyngioma is essentially excluded. Infundibular metastasis is excluded.  COGNITION: Overall cognitive status: Within functional limits for tasks assessed    Cervical ROM:   pt very guarded with cervical movements    GAIT: Gait pattern: decreased stance time- Right, decreased stride length, antalgic, trendelenburg, lateral lean- Right, and trunk flexed Distance walked: Clinic distances  Assistive device utilized: None Level of assistance: Modified independence   VESTIBULAR ASSESSMENT:  GENERAL OBSERVATION: Ambulates in with no AD, antalgic gait pattern with trunk lean to R   SYMPTOM BEHAVIOR:  Subjective history: See above   Non-Vestibular symptoms: changes in vision, headaches, migraine symptoms, and pt reporting has blurred vision and has seen an eye doctor and reports she has cataracts, does not wear glasses as she feels it makes it worse   Type  of dizziness: Imbalance (Disequilibrium) and Spinning/Vertigo  Frequency: Comes and goes, feels like episodes are worse when her allergies are worse   Duration: ~5 minutes if she has episodes   Aggravating factors: Induced by motion: turning head quickly and sitting up and down, being in a car for too long and head will start to spin   Relieving factors: dark room, closing eyes, and putting cold water on her face   Progression of symptoms: better  OCULOMOTOR EXAM:  Ocular Alignment: normal  Ocular ROM: No Limitations  Spontaneous Nystagmus: absent  Gaze-Induced Nystagmus: absent  Smooth Pursuits: intact and reports this bothered her eyes, more blinking, some jumping going through midline   Saccades: extra eye movements and ~2 saccadic beats in horizontal direction, incr dizziness when going back to the center, ~2 saccadic beats in vertical direction, did not cause dizziness  Notes brighter lights in tx room was triggering for a headache, so turned down one of the bright lights    VESTIBULAR - OCULAR REFLEX:   Slow VOR: pt very guarded, does not let PT move her head even slowly, PT tried multiple times, when asked to move her head pt can move it [facility] and reports it makes her dizzy   VOR Cancellation: unable to test due to guarding   Head-Impulse Test: unable to test due to guarding   MOTION SENSITIVITY:  Motion Sensitivity Quotient Intensity: 0 = none, 1 = Lightheaded, 2 = Mild, 3 = Moderate, 4 = Severe, 5 = Vomiting  Intensity Intensity, 01/12/2024  1. Sitting to supine 0   2. Supine to L side 0   3. Supine to R side 0   4. Supine to sitting 1 1  5. L Sidelying (modified dix) 0   6. Up from L  1 1-2  7. R Sidelying (modified dix) 0   8. Up from R  1 1-2  9. Sitting, head tipped to L knee 3-4, 12/19/23: 0 0  10. Head up from L knee 0   11. Sitting, head tipped to R knee 3-4, 12/19/23: 0 0  12. Head up from R knee 0   13. Sitting head turns x5 1-2 (reports if she had to do more  head turns, would be unable to do it) 2  14.Sitting head nods x5 2-3 (felt like it was going to get worse the more she did) 2  15. In stance, 180 turn to L  5 (not vomiting), 12/19/23: 3 3.5  16. In stance, 180 turn to R Pt sat down immediately after turning to the L and unable to try going the other direction, 12/19/23: 3 3.5  M-CTSIB  Condition 1: Firm Surface, EO 30 Sec, Normal Sway  Condition 2: Firm Surface, EC 30 Sec, Normal Sway, felt unsteady   Condition 3: Foam Surface, EO 30 Sec, Normal Sway, felt unsteady  Condition 4: Foam Surface, EC Attempt 1: 5 Sec, Attempt 2: 29 seconds, Moderate-severe sway, felt very unsteady   01/12/2024: 30 seconds, minimal sway                                                       TREATMENT DATE: 01/09/24  Therapeutic Activity:  Vitals:   01/12/24 0939  BP: 105/78  Pulse: 79    Assessed in seated, pt denies lightheadedness  Discussed purpose of vestibular PT and purpose of bringing on dizziness with exercises in order to retrain brain/vestibular system. Educated on POC going forwards - pt has one more visit and pt reports she has not made improvements with PT, and discussed plan to D/C at next session with pt in agreement   NMR: MSQ Assessment:  Intensity, 01/12/2024  1. Sitting to supine   2. Supine to L side   3. Supine to R side   4. Supine to sitting 1  5. L Sidelying (modified dix)   6. Up from L  1-2  7. R Sidelying (modified dix)   8. Up from R  1-2  9. Sitting, head tipped to L knee 0  10. Head up from L knee   11. Sitting, head tipped to R knee 0  12. Head up from R knee   13. Sitting head turns x5 2  14.Sitting head nods x5 2  15. In stance, 180 turn to L  3.5  16. In stance, 180 turn to R 3.5  *If blank above, this means it was not tested because it was 0/5 at evaluation  DHI questionnaire: 40, moderate handicap (12 point improvement from  evaluation) mCTSIB: Met 30 seconds on all conditions *4th condition: 30 seconds completed with minimal sway  On air ex in corner, all CGA but no correction needed: EO narrow BOS horizontal head turns x 30 seconds, 1/5 dizziness EO narrow BOS vertical head turns x 30 seconds, 1/5 dizziness EC narrow BOS horizontal head turns x 30 seconds, 1-2/5 dizziness EC narrow BOS vertical head turns x 30 seconds, 2-3/5 dizziness, most symptomatic and needing rest/water after Holding ball and making circles for gaze stabilization, 2 sets x 10 reps, bothered her shoulders  Holding ball and tossing in vertical direction 10 reps, 1-2/5 dizziness  Intermittent breaks needed to allow for sx to subside with cold water provided to help decrease sx.   PATIENT EDUCATION: Education details: continue HEP, reviewed HEP, provided food insecurity resources Person educated: Patient Education method: Programmer, Multimedia, Demonstration, Verbal cues, and Handouts Education comprehension: verbalized understanding, returned demonstration, and needs further education  HOME EXERCISE PROGRAM: Bending / Picking Up Objects   Sitting, slowly bend head down and pick up object on the floor. Return to upright position. Hold position until symptoms subside. Repeat __10__ times per session. Do __3__ sessions per day.   Head Motion: Side to Side   Sitting, tilt head down 15-30, slowly move head to right with eyes open. Hold position until symptoms subside. Then, move head slowly to opposite side. Hold position until symptoms subside. Repeat __10__ times per session. Do __3__ sessions per  day.   Standing VOR x 1 in Horizontal and Vertical Direction 30 seconds  Access Code: [email or web address] URL: [email or web address] Date: 01/09/2024 Prepared by: Sheffield Senate  Exercises - Seated VOR Cancellation  - 1-2 x daily - 5 x weekly - 2 sets - 10 reps - Standing Balance with Eyes Closed on Foam  - 1 x daily - 5 x weekly - 3 sets  - 30 hold - Wide Stance with Head Rotation on Foam Pad  - 1-2 x daily - 5 x weekly - 2 sets - 5 reps  GOALS: Goals reviewed with patient? Yes  SHORT TERM GOALS: ALL STGS = LTGS   LONG TERM GOALS: Target date: 01/11/2024  Pt will be independent with final HEP in regards to balance/vestibular deficits.  Baseline:  Goal status: MET  2.  Pt will report items on MSQ as 2/5 or less in order to demo decr motion sensitivity.  Baseline: see chart on 10/8 Goal status: PARTIALLY MET  3.  mCTSIB will improve from 17 seconds foam EC to 30 seconds foam EC in order to improve balance and decrease risk for falls. Baseline: see chart of 12/19/23 Goal status: MET  4.  Patient will improve DHI score to </= 36 to demonstrate reduced symptoms  Baseline: 52 Goal status: PARTIALLY MET   ASSESSMENT:  CLINICAL IMPRESSION: Today's session reassessed goals, reviewed HEP and discussed discharge today as dizziness has little improvement. All assessments including mCTSIB and DHI showed improvement. MSQ improved in one area including head nods, as this presents as more symptomatic for patient at evaluation. With improved mCTSIB, patient has improved balance and reduced risk for falls in conditions in which different systems are compromised, such as ambulating in low light environments. Turning 180 degrees in both directions continues to promote dizziness for the patient. Patient verbally agrees to discharge today and is aware she can return per physician referral if desired and if balance deficits arise.   OBJECTIVE IMPAIRMENTS: Abnormal gait, decreased activity tolerance, decreased balance, decreased endurance, decreased mobility, difficulty walking, decreased ROM, dizziness, postural dysfunction, and pain.   ACTIVITY LIMITATIONS: lifting, bending, squatting, stairs, dressing, hygiene/grooming, locomotion level, and caring for others  PARTICIPATION LIMITATIONS: meal prep, cleaning, laundry, driving, and  community activity  PERSONAL FACTORS: Behavior pattern, Past/current experiences, Time since onset of injury/illness/exacerbation, and 3+ comorbidities: Left sided occipital neuralgia, vestibular migraine, anxiety, anemia, osteoporosis, PTSD, chronic dizziness, SLE and antiphospholipid antibody syndrome  are also affecting patient's functional outcome.   REHAB POTENTIAL: Good  CLINICAL DECISION MAKING: Evolving/moderate complexity  EVALUATION COMPLEXITY: Moderate   PLAN:  PT FREQUENCY: 2x/week  PT DURATION: 4 weeks  PLANNED INTERVENTIONS: 97164- PT Re-evaluation, 97110-Therapeutic exercises, 97530- Therapeutic activity, 97112- Neuromuscular re-education, 97535- Self Care, [id number]- Manual therapy, [phone]- Gait training, [phone]- Canalith repositioning, Patient/Family education, Balance training, Stair training, Vestibular training, and DME instructions  PLAN FOR NEXT SESSION:   Discharged  Emmalene Sherry, Student-PT 01/13/2024, 7:49 AM

## 2024-01-16 ENCOUNTER — Ambulatory Visit

## 2024-01-16 DIAGNOSIS — J309 Allergic rhinitis, unspecified: Secondary | ICD-10-CM | POA: Diagnosis not present

## 2024-01-18 ENCOUNTER — Inpatient Hospital Stay

## 2024-01-18 ENCOUNTER — Inpatient Hospital Stay: Attending: Nurse Practitioner

## 2024-01-18 ENCOUNTER — Encounter (HOSPITAL_COMMUNITY): Payer: Self-pay | Admitting: Internal Medicine

## 2024-01-18 DIAGNOSIS — D508 Other iron deficiency anemias: Secondary | ICD-10-CM

## 2024-01-18 MED ORDER — CYANOCOBALAMIN 1000 MCG/ML IJ SOLN
1000.0000 ug | Freq: Once | INTRAMUSCULAR | Status: AC
  Administered 2024-01-18: 1000 ug via INTRAMUSCULAR
  Filled 2024-01-18: qty 1

## 2024-02-04 ENCOUNTER — Other Ambulatory Visit: Payer: Self-pay | Admitting: Internal Medicine

## 2024-02-04 ENCOUNTER — Other Ambulatory Visit: Payer: Self-pay | Admitting: Family Medicine

## 2024-02-04 DIAGNOSIS — D6861 Antiphospholipid syndrome: Secondary | ICD-10-CM

## 2024-02-13 ENCOUNTER — Ambulatory Visit

## 2024-02-13 DIAGNOSIS — J309 Allergic rhinitis, unspecified: Secondary | ICD-10-CM

## 2024-02-13 DIAGNOSIS — J302 Other seasonal allergic rhinitis: Secondary | ICD-10-CM | POA: Diagnosis not present

## 2024-02-14 ENCOUNTER — Other Ambulatory Visit (HOSPITAL_COMMUNITY): Payer: Self-pay | Admitting: General Surgery

## 2024-02-14 DIAGNOSIS — K219 Gastro-esophageal reflux disease without esophagitis: Secondary | ICD-10-CM

## 2024-02-14 DIAGNOSIS — Z9884 Bariatric surgery status: Secondary | ICD-10-CM

## 2024-02-14 LAB — GENECONNECT MOLECULAR SCREEN: Genetic Analysis Overall Interpretation: NEGATIVE

## 2024-02-15 ENCOUNTER — Inpatient Hospital Stay: Attending: Nurse Practitioner

## 2024-02-15 DIAGNOSIS — E538 Deficiency of other specified B group vitamins: Secondary | ICD-10-CM | POA: Insufficient documentation

## 2024-02-15 DIAGNOSIS — D508 Other iron deficiency anemias: Secondary | ICD-10-CM

## 2024-02-15 MED ORDER — CYANOCOBALAMIN 1000 MCG/ML IJ SOLN
1000.0000 ug | Freq: Once | INTRAMUSCULAR | Status: AC
  Administered 2024-02-15: 1000 ug via INTRAMUSCULAR
  Filled 2024-02-15: qty 1

## 2024-02-20 ENCOUNTER — Inpatient Hospital Stay (HOSPITAL_COMMUNITY): Admission: RE | Admit: 2024-02-20 | Discharge: 2024-02-20 | Attending: General Surgery

## 2024-02-20 ENCOUNTER — Other Ambulatory Visit (HOSPITAL_COMMUNITY): Payer: Self-pay | Admitting: Psychiatry

## 2024-02-20 DIAGNOSIS — F4310 Post-traumatic stress disorder, unspecified: Secondary | ICD-10-CM

## 2024-02-20 DIAGNOSIS — K449 Diaphragmatic hernia without obstruction or gangrene: Secondary | ICD-10-CM | POA: Insufficient documentation

## 2024-02-20 DIAGNOSIS — Z9884 Bariatric surgery status: Secondary | ICD-10-CM | POA: Insufficient documentation

## 2024-02-20 DIAGNOSIS — F331 Major depressive disorder, recurrent, moderate: Secondary | ICD-10-CM

## 2024-02-20 DIAGNOSIS — K219 Gastro-esophageal reflux disease without esophagitis: Secondary | ICD-10-CM

## 2024-02-21 ENCOUNTER — Ambulatory Visit: Admitting: Nurse Practitioner

## 2024-02-21 ENCOUNTER — Encounter: Payer: Self-pay | Admitting: Nurse Practitioner

## 2024-02-21 VITALS — BP 100/70 | HR 80 | Ht <= 58 in | Wt 193.0 lb

## 2024-02-21 DIAGNOSIS — K449 Diaphragmatic hernia without obstruction or gangrene: Secondary | ICD-10-CM

## 2024-02-21 DIAGNOSIS — K219 Gastro-esophageal reflux disease without esophagitis: Secondary | ICD-10-CM

## 2024-02-21 DIAGNOSIS — K5900 Constipation, unspecified: Secondary | ICD-10-CM

## 2024-02-21 MED ORDER — POLYETHYLENE GLYCOL 3350 17 GM/SCOOP PO POWD: 17.0000 g | Freq: Every day | ORAL | 2 refills | Status: AC

## 2024-02-21 NOTE — Progress Notes (Signed)
 Agree with assessment and plan as outlined.

## 2024-02-21 NOTE — Progress Notes (Signed)
 02/21/2024 Melissa Gaines [id number] 03-30-1970   Chief Complaint: Abdominal pain, food gets stuck in esophagus  History of Present Illness: Melissa Gaines is a 53 year old female with a past medical history of anxiety, asthma, osteoporosis, systemic lupus erythematosus, PE 2010 on Eliquis , chronic pain, IDA and GERD. S/P gastric bypass surgery 2014 performed in [place] .  She is known by Dr. Leigh.  Discussed the use of AI scribe software for clinical note transcription with the patient, who gave verbal consent to proceed.  History of Present Illness  Melissa Gaines is a 53 year old female who presents with persistent stomach pain and food getting stuck despite taking as esomeprazole and famotidine .  She underwent an EGD and colonoscopy by Dr. Leigh 09/15/2023.  The EGD identified a 4 cm hiatal hernia, tortuous distal esophagus which was empirically dilated, evidence of a sleeve gastrectomy with healthy appearing mucosa and a normal duodenum.  It was suspected that her upper GI symptoms were secondary to a hiatal hernia and tortuous distal esophagus.  She was referred to general surgery.  Colonoscopy identified 2 tubular adenomatous polyps removed from the ascending and transverse colon.  A repeat colonoscopy was recommended, will likely require 2-day bowel prep.  She experiences ongoing stomach pain and episodes where food feels stuck, leading to vomiting once or twice a month. The vomiting occurs when food texture causes it to feel stuck and not go down properly. The pain and food sticking have not improved with medication.  She has been experiencing acid reflux, particularly at night, which she manages by chewing ice. Ice cream and milk can help with heartburn but may upset her stomach due to possible lactose intolerance. She has not tried Gaviscon but has used Tums, which provides some relief.  She is currently taking  esomeprazole daily and famotidine  twice daily. She completed a trial of Voquenza after an upper endoscopy, but it did not result in any improvement.  She was evaluated by general surgeon Dr. Stevie 11/10/2023 with recent office follow-up 01/18/2024.  Hiatal hernia repair and gastric bypass version was recommended, surgery has not yet been scheduled.  Her bowel movements have become less frequent and smaller in volume, occurring once or twice a day but not in the usual amount. She is unsure if she is currently taking Miralax  but mentions using fiber supplements. No blood in her bowel movements.  GI PROCEDURES:  EGD 09/15/2023: - Esophagogastric landmarks identified.  - 4 cm hiatal hernia.  - Tortuous distal esophagus.  - Normal esophagus otherwise - empirically dilated to 17mm.  - A sleeve gastrectomy was found, characterized by healthy appearing mucosa.  - Normal examined duodenum. Suspect symptoms are coming from hiatal hernia, tortous distal esophagus with angulated turn entering hernia sac. Empiric dilation performed with Savary to see if that will help at all. Otherwise for reflux could try Voquezna  10mg  / day trial, samples at office, to see if that helps in the interim. If symptoms persist, consideration for hiatal hernia repair but BMI would need to be lower to be a candidate for surgery (BMI < 35). Weight loss would help symptoms.  Colonoscopy 09/15/2023:  - One 3 mm polyp in the ascending colon, removed with a cold snare. Resected and retrieved.  - Two 3 to 4 mm polyps in the transverse colon, removed with a cold snare. Resected and retrieved.  - Restricted sigmoid colon requiring use of ultraslim pediatric colonoscope.  - Stool in the entire examined colon  leading to extensive lavage.  - The examination was otherwise normal. - 5 year recall colonoscopy A. COLON, TRANSVERSE, ASCENDING, POLYPECTOMY:  - Fragments of tubular adenomas.   Past Medical History:  Diagnosis Date   Anemia     Anxiety    Arthritis    Asthma    Cancer (HCC)    skin cancer   Chronic pain    Complication of anesthesia    Asthma attack during colonoscopy and EGD   Congenital hip dislocation    Depression    GERD (gastroesophageal reflux disease)    Hx pulmonary embolism 07/08/2017   Iron deficiency anemia 04/14/2018   Kidney mass 2017   Migraine    Osteoporosis    Pathological dislocation of shoulder joint, bilateral    congential   Pneumonia    as a child   PTSD (post-traumatic stress disorder)    Sleep apnea    Hx. of no CPAP   Systemic lupus erythematosus (HCC)    Tuberculosis    as a child was treated   Urticaria    Past Surgical History:  Procedure Laterality Date   CESAREAN SECTION  2005   CESAREAN SECTION W/BTL  2010   COLONOSCOPY N/A 09/15/2023   Procedure: COLONOSCOPY;  Surgeon: Leigh Elspeth SQUIBB, MD;  Location: [facility];  Service: Gastroenterology;  Laterality: N/A;   ESOPHAGOGASTRODUODENOSCOPY N/A 09/15/2023   Procedure: EGD (ESOPHAGOGASTRODUODENOSCOPY);  Surgeon: Leigh Elspeth SQUIBB, MD;  Location: [facility];  Service: Gastroenterology;  Laterality: N/A;   HIP SURGERY     in childhood for congenital hip dislocation   LAPAROSCOPIC GASTRIC SLEEVE RESECTION  2014   POLYPECTOMY  09/15/2023   Procedure: POLYPECTOMY, INTESTINE;  Surgeon: Leigh Elspeth SQUIBB, MD;  Location: [facility];  Service: Gastroenterology;;   HARLEY DILATION N/A 09/15/2023   Procedure: EGD, WITH DILATION USING SAVARY-GILLIARD DILATOR OVER GUIDEWIRE;  Surgeon: Leigh Elspeth SQUIBB, MD;  Location: [facility];  Service: Gastroenterology;  Laterality: N/A;   STAPEDECTOMY  11/01/2011   L postauricular stapedectomy with CO2 laser and insertion of 6 x 4.75 mm SMart Piston   TOTAL HIP ARTHROPLASTY     05/2000 R, 11/2000 L   Medications Ordered Prior to Encounter[1] Allergies[2]  Current Medications, Allergies, Past Medical History, Past Surgical History, Family History and Social History were  reviewed in [employer] record.  Review of Systems:   Constitutional: Negative for fever, sweats, chills or weight loss.  Respiratory: Negative for shortness of breath.   Cardiovascular: Negative for chest pain, palpitations and leg swelling.  Gastrointestinal: See HPI.  Musculoskeletal: Negative for back pain or muscle aches.  Neurological: Negative for dizziness, headaches or paresthesias.   Physical Exam: BP 100/70 (BP Location: Left Arm, Patient Position: Sitting, Cuff Size: Large)   Pulse 80   Ht 4' 9.5 (1.461 m)   Wt 193 lb (87.5 kg)   BMI 41.04 kg/m  Wt Readings from Last 3 Encounters:  02/21/24 193 lb (87.5 kg)  11/17/23 187 lb (84.8 kg)  11/16/23 185 lb (83.9 kg)    General: in no acute distress. Head: Normocephalic and atraumatic. Eyes: No scleral icterus. Conjunctiva pink . Ears: Normal auditory acuity. Mouth: Dentition intact. No ulcers or lesions.  Lungs: Clear throughout to auscultation. Heart: Regular rate and rhythm, no murmur. Abdomen: Soft, nontender and nondistended. No masses or hepatomegaly. Normal bowel sounds x 4 quadrants.  Rectal: Deferred.  Musculoskeletal: Symmetrical with no gross deformities. Extremities: No edema. Neurological: Alert oriented x 4. No focal deficits.  Psychological:  Alert and cooperative. Normal mood and affect  Assessment and Recommendations:  53 year old female with a history of GERD, hiatal hernia and past gastrectomy with ongoing upper abdominal pain and intermittent dysphagia, nausea and vomiting.  EGD 09/15/2023 showed a torturous esophagus and a 4 cm hiatal hernia with evidence of intact sleeve gastrectomy.  Upper GI series 12/15 showed mild reflux and gastric band present with moderate hiatal hernia.  Evaluated by general surgery, planning on future hiatal hernia repair and gastric bypass conversion surgery date has not yet been scheduled. - Proceed with hiatal hernia and gastric bypass conversion  surgery with Dr. Stevie date not yet scheduled - Patient to follow-up in our office few months postsurgery to further determine what ongoing GI symptoms she has at that juncture - Continue as emeprazole 40 mg daily and famotidine  20 mg twice daily.  Add Gaviscon 1 tablespoon nightly, may take additional 2 doses as needed as well - Eat 3-4 small snack sized meals daily - Patient to contact our office if her GI symptoms worsen prior to her surgical date  History of colon polyps. Colonoscopy 09/15/2023 identified 2 tubular adenomatous polyps removed from the ascending and transverse colon. - Next colon polyp surveillance colonoscopy due 09/2028, recommend a 2-day bowel prep  Constipation - MiraLAX  nightly as needed     [1]  Current Outpatient Medications on File Prior to Visit  Medication Sig Dispense Refill   acetaminophen -codeine (TYLENOL  #3) 300-30 MG tablet Take 1 tablet by mouth 2 (two) times daily as needed.     albuterol  (VENTOLIN  HFA) 108 (90 Base) MCG/ACT inhaler Inhale 2 puffs into the lungs every 6 (six) hours as needed for wheezing or shortness of breath. 8 g 2   atorvastatin  (LIPITOR) 80 MG tablet Take 1 tablet (80 mg total) by mouth daily. 90 tablet 3   azelastine  (ASTELIN ) 0.1 % nasal spray Place 1-2 sprays into both nostrils 2 (two) times daily as needed (nasal drainage). Use in each nostril as directed 90 mL 2   budesonide -formoterol  (SYMBICORT ) 160-4.5 MCG/ACT inhaler Inhale 2 puffs into the lungs in the morning and at bedtime. with spacer and rinse mouth afterwards. 3 each 2   diclofenac  Sodium (VOLTAREN  ARTHRITIS PAIN) 1 % GEL Apply 4 g topically 4 (four) times daily. 350 g 0   ELIQUIS  5 MG TABS tablet TAKE 1 TABLET BY MOUTH TWICE  DAILY 180 tablet 3   EPINEPHrine  0.3 mg/0.3 mL IJ SOAJ injection INJECT INTRAMUSCULARLY 1 PEN AS  NEEDED FOR ALLERGIC RESPONSE AS  DIRECTED BY MD. SEEK MEDICAL  HELP AFTER USE. 4 each 1   Erenumab -aooe (AIMOVIG ) 140 MG/ML SOAJ Inject 140 mg into  the skin every 30 (thirty) days. 1.12 mL 11   ezetimibe  (ZETIA ) 10 MG tablet TAKE 1 TABLET BY MOUTH DAILY 100 tablet 2   ferrous sulfate  325 (65 FE) MG tablet Take 1 tablet (325 mg total) by mouth 3 (three) times daily with meals. 90 tablet 1   fluticasone  (FLONASE ) 50 MCG/ACT nasal spray Place 1-2 sprays into both nostrils daily as needed (nasal congestion). 48 g 2   gabapentin  (NEURONTIN ) 300 MG capsule Take 1 capsule (300 mg total) by mouth at bedtime. 90 capsule 0   levocetirizine (XYZAL ) 5 MG tablet Take 1 tablet (5 mg total) by mouth every evening. 90 tablet 2   meclizine  (ANTIVERT ) 25 MG tablet Take 1 tablet (25 mg total) by mouth 3 (three) times daily as needed for dizziness. 30 tablet 0   ondansetron  (ZOFRAN -ODT) 4  MG disintegrating tablet Take 1 tablet (4 mg total) by mouth every 8 (eight) hours as needed. 20 tablet 5   RESTASIS  0.05 % ophthalmic emulsion Apply 1 drop to eye.     sucralfate (CARAFATE) 1 g tablet Take 1 g by mouth.     traZODone  (DESYREL ) 100 MG tablet Take 1 tablet (100 mg total) by mouth at bedtime. 90 tablet 0   Ubrogepant  (UBRELVY ) 100 MG TABS Take 1 tablet (100 mg total) by mouth as needed (May repeat in 2 hours.  Maximum 2 tablets in 24 hours). 10 tablet 11   VITAMIN D  PO Take 1 capsule by mouth every Thursday.     esomeprazole (NEXIUM) 40 MG capsule Take 40 mg by mouth. (Patient not taking: Reported on 02/21/2024)     famotidine  (PEPCID ) 20 MG tablet TAKE 1 TABLET BY MOUTH TWICE  DAILY (Patient not taking: Reported on 02/21/2024) 120 tablet 5   pantoprazole  (PROTONIX ) 40 MG tablet  (Patient not taking: Reported on 02/21/2024)     No current facility-administered medications on file prior to visit.  [2]  Allergies Allergen Reactions   Fish Allergy  Anaphylaxis, Shortness Of Breath and Swelling    Throat closes Specifically salmon and tilapia

## 2024-02-21 NOTE — Patient Instructions (Addendum)
 _______________________________________________________  If your blood pressure at your visit was 140/90 or greater, please contact your primary care physician to follow up on this.  _______________________________________________________  If you are age 52 or older, your body mass index should be between 23-30. Your Body mass index is 41.04 kg/m. If this is out of the aforementioned range listed, please consider follow up with your Primary Care Provider.  If you are age 44 or younger, your body mass index should be between 19-25. Your Body mass index is 41.04 kg/m. If this is out of the aformentioned range listed, please consider follow up with your Primary Care Provider.   ________________________________________________________  The [facility] providers would like to encourage you to use MYCHART to communicate with providers for non-urgent requests or questions.  Due to long hold times on the telephone, sending your provider a message by [facility] may be a faster and more efficient way to get a response.  Please allow 48 business hours for a response.  Please remember that this is for non-urgent requests.  _______________________________________________________  [facility] is using a team-based approach to care.  Your team is made up of your doctor and two to three APPS. Our APPS (Nurse Practitioners and Physician Assistants) work with your physician to ensure care continuity for you. They are fully qualified to address your health concerns and develop a treatment plan. They communicate directly with your gastroenterologist to care for you. Seeing the Advanced Practice Practitioners on your physician's team can help you by facilitating care more promptly, often allowing for earlier appointments, access to diagnostic testing, procedures, and other specialty referrals.   We have sent the following medications to your pharmacy for you to pick up at your convenience: Miralax  17g mix in  8oz of water at night  Please purchase the following medications over the counter and take as directed: Gaviscon liquid 1 tablespoon take at night   Please follow up with [facility] regarding hiatal hernia surgery and gastric sleeve.  Thank you for trusting me with your gastrointestinal care!   Elida Shawl, CRNP

## 2024-02-23 ENCOUNTER — Other Ambulatory Visit: Payer: Self-pay

## 2024-02-23 ENCOUNTER — Encounter: Attending: General Surgery | Admitting: Dietician

## 2024-02-23 ENCOUNTER — Encounter: Payer: Self-pay | Admitting: Dietician

## 2024-02-23 ENCOUNTER — Ambulatory Visit (HOSPITAL_COMMUNITY): Admitting: Psychiatry

## 2024-02-23 ENCOUNTER — Encounter (HOSPITAL_COMMUNITY): Payer: Self-pay | Admitting: Psychiatry

## 2024-02-23 VITALS — Ht <= 58 in | Wt 191.3 lb

## 2024-02-23 VITALS — BP 117/90 | HR 78 | Ht <= 58 in | Wt 191.0 lb

## 2024-02-23 DIAGNOSIS — F4310 Post-traumatic stress disorder, unspecified: Secondary | ICD-10-CM | POA: Diagnosis not present

## 2024-02-23 DIAGNOSIS — Z6839 Body mass index (BMI) 39.0-39.9, adult: Secondary | ICD-10-CM | POA: Diagnosis not present

## 2024-02-23 DIAGNOSIS — E669 Obesity, unspecified: Secondary | ICD-10-CM | POA: Diagnosis present

## 2024-02-23 DIAGNOSIS — Z713 Dietary counseling and surveillance: Secondary | ICD-10-CM | POA: Insufficient documentation

## 2024-02-23 DIAGNOSIS — K2100 Gastro-esophageal reflux disease with esophagitis, without bleeding: Secondary | ICD-10-CM | POA: Diagnosis not present

## 2024-02-23 DIAGNOSIS — Z9884 Bariatric surgery status: Secondary | ICD-10-CM | POA: Diagnosis not present

## 2024-02-23 DIAGNOSIS — K449 Diaphragmatic hernia without obstruction or gangrene: Secondary | ICD-10-CM | POA: Insufficient documentation

## 2024-02-23 DIAGNOSIS — F331 Major depressive disorder, recurrent, moderate: Secondary | ICD-10-CM | POA: Diagnosis not present

## 2024-02-23 DIAGNOSIS — K219 Gastro-esophageal reflux disease without esophagitis: Secondary | ICD-10-CM | POA: Insufficient documentation

## 2024-02-23 MED ORDER — TRAZODONE HCL 150 MG PO TABS: 150.0000 mg | ORAL_TABLET | Freq: Every day | ORAL | 0 refills | Status: DC

## 2024-02-23 NOTE — Progress Notes (Addendum)
 Nutrition Assessment for Bariatric Surgery: Pre-Surgery Behavioral and Nutrition Intervention Program   Medical Nutrition Therapy  Appt Start Time: 1146    End Time: 1251  Patient was seen on 02/23/2024 for Pre-Operative Nutrition Assessment. Purpose of todays visit  enhance perioperative outcomes along with a healthy weight maintenance   Referral stated Supervised Weight Loss (SWL) visits needed: 6 Months  Planned surgery: conversion from Sleeve Gastrectomy to Gastric By-Pass Pt expectation of surgery: relief from GERD/reflux/hiatal hernia  NUTRITION ASSESSMENT   Anthropometrics  Start weight at [facility]: 191.3 lbs (date: 02/23/2024)  Height: 58 in BMI: 39.98 kg/m2     Clinical  Medical hx: sleeve gastrectomy, GERD, sleep apnea, cancer, anemia, depression, asthma, osteoporosis Medications: vit D, Protonix , Zofran , Antivert , xyzal , gabapentin , Flonase , ferrous sulfate , Nexium, Lipitor, albuterol   Supplements: iron, vit D, B-12 shot once a month Labs: Vit D 19.4, A1c 5.7, iron saturation 14, chol 205, LDL 137 Allergies: lactose intolerant Notable signs/symptoms: none noted Any previous deficiencies? No  Evaluation of Nutritional Deficiencies: Micronutrient Nutrition Focused Physical Exam: Hair: No issues observed Eyes: No issues observed Mouth: No issues observed Neck: No issues observed Nails: No issues observed Skin: No issues observed  Lifestyle & Dietary Hx Pt arrived using public transportation. Pt states she does not work, stating she is disabled. Pt states she had the sleeve surgery in [place], in 2013  Pt states she suffers from reflux and hard to get food down, stating it gets stuck and she has to wash it down with fluid and gets full on the fluid and not the food. Pt states that big pills (supplements) get stuck, stating she will not take it. Pt states she is lactose intolerant. Pt states she has metal plates in both hips. Pt states that she does not drink alcohol, stating  it hurts her stomach for some reason.  Current Physical Activity Recommendations state 150 minutes per week of moderate to vigorous movement including Cardio and 1-2 days of resistance activities as well as flexibility/balance activities:  Pts current physical activity: ADLs, walk to the store and walk the dog, with 0% recommendation reached   Sleep Hygiene: duration and quality: not using a CPAP machine  Current Patient Perceived Stress Level as stated by pt on a scale of 1-10:  6       Stress Management Techniques: read, stress ball, breathing/time-out  According to the Dietary Guidelines for Americans Recommendation: equivalent 1.5-2 cups fruits per day, equivalent 2-3 cups vegetables per day and at least half all grains whole  Fruit servings per day (on average): 2, meeting 100% recommendation  Non-starchy vegetable servings per day (on average): 1-2, meeting 50-66% recommendation  Whole Grains per day (on average): 0-1  Number of meals missed/skipped per week out of 21: 7  24-Hr Dietary Recall First Meal: skip Snack:  Second Meal: apple (no food right now) Snack: fruit Third Meal: rice, chicken, peas and carrots Snack: sometimes a fruit cup Beverages: water, juice, soda  Alcoholic beverages per week: 0   Estimated Energy Needs Calories: 1400  NUTRITION DIAGNOSIS  Overweight/obesity ([place]-3.3) related to past poor dietary habits and physical inactivity as evidenced by patient w/ planned conversion from sleeve to gastric by-pass surgery following dietary guidelines for continued weight loss.  NUTRITION INTERVENTION  Nutrition counseling (C-1) and education (E-2) to facilitate bariatric surgery goals.  Educated pt on micronutrient deficiencies post-surgery and behavioral/dietary strategies to start in order to mitigate that risk   Behavioral and Dietary Interventions Pre-Op Goals Reviewed with the  Patient Nutrition: Healthy Eating Behaviors Switch to non-caloric,  non-carbonated and non-caffeinated beverages such as  water, unsweetened tea, Crystal Light and zero calorie beverages (aim for 64 oz. per day) Cut out grazing between meals or at night  Find a protein shake you like Eat every 3-5 hours        Eliminate distractions while eating (TV, computer, reading, driving, texting) Take 79-69 minutes to eat a meal  Decrease high sugar foods/decrease high fat/fried foods Eliminate alcoholic beverages Increase protein intake (eggs, fish, chicken, yogurt) before surgery Eat non starchy vegetables 2 times a day 7 days a week Eat complex carbohydrates such as whole grains and fruits   Behavioral Modification: Physical Activity Increase my usual daily activity (use stairs, park farther, etc.) Engage in _______________________  activity  _______ minutes ______ times per week  Other:   *Goals that are bolded indicate the pt would like to start working towards these  Handouts Provided Include  Bariatric Surgery handouts (Nutrition Visits, Pre Surgery Behavioral Change Goals, Protein Shakes Brands to Choose From, Vitamins & Mineral Supplementation)  Learning Style & Readiness for Change Teaching method utilized: Visual, Auditory, and hands on  Demonstrated degree of understanding via: Teach Back  Readiness Level: preparation Barriers to learning/adherence to lifestyle change: reflux; disability; financial limitations   RD's Notes for Next Visit Patient progress toward chosen goals   MONITORING & EVALUATION Dietary intake, weekly physical activity, body weight, and preoperative behavioral change goals   Next Steps  Patient is to follow up at [facility] in 2-4 weeks for first SWL visit.
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
## 2024-03-05 ENCOUNTER — Encounter (HOSPITAL_COMMUNITY): Payer: Self-pay | Admitting: Internal Medicine

## 2024-03-05 ENCOUNTER — Ambulatory Visit: Payer: Self-pay | Admitting: Family Medicine

## 2024-03-05 ENCOUNTER — Encounter: Payer: Self-pay | Admitting: Family Medicine

## 2024-03-05 DIAGNOSIS — K449 Diaphragmatic hernia without obstruction or gangrene: Secondary | ICD-10-CM

## 2024-03-05 DIAGNOSIS — K219 Gastro-esophageal reflux disease without esophagitis: Secondary | ICD-10-CM

## 2024-03-05 NOTE — Assessment & Plan Note (Addendum)
 Filled out form and provided letter of medical necessity for bariatric surgery, given her BMI refractory to multiple interventions and history of sleeve gastrectomy I do feel that she would be a good candidate for operative procedure She is on Eliquis , DC 2 days prior to procedure. May need formal preop appointment pending [facility] eval

## 2024-03-05 NOTE — Progress Notes (Signed)
" ° ° °  SUBJECTIVE:   CHIEF COMPLAINT / HPI:   Here for form completion regarding bariatric surgery for her morbid obesity She has a history of sleeve gastrectomy performed in [place]  and saw [facility] [place] [facility] in November given persistent/worsening reflux symptoms and refractory weight gain.  Also has history of hiatal hernia.  She is seeing nutrition as well recently Over the past 5 years she has actually attempted several interventions for weight loss including eating less, eating more healthy, exercising for 2 to 3 hours a week, in addition to sleeve gastrectomy procedure in 2014  However she still has a BMI greater than 40.  Prior [facility] [place] [facility] she would be a candidate for bariatric surgery   PERTINENT  PMH / PSH: hx sleeve gastrectomy, gerd, OSA, anemia, depression, asthma, osteoporosis  OBJECTIVE:   BP 123/84   Pulse 96   Ht 4' 10 (1.473 m)   Wt 197 lb 3.2 oz (89.4 kg)   SpO2 90%   BMI 41.21 kg/m    General: NAD, pleasant, able to participate in exam Respiratory: No respiratory distress Skin: warm and dry, no rashes noted Psych: Normal affect and mood  ASSESSMENT/PLAN:    Assessment & Plan Morbid obesity (HCC) Gastroesophageal reflux disease, unspecified whether esophagitis present Hiatal hernia Filled out form and provided letter of medical necessity for bariatric surgery, given her BMI refractory to multiple interventions and history of sleeve gastrectomy I do feel that she would be a good candidate for operative procedure She is on Eliquis , DC 2 days prior to procedure. May need formal preop appointment pending [facility] eval   Payton Coward, MD [facility] "

## 2024-03-05 NOTE — Patient Instructions (Signed)
 We will fax over your paperwork

## 2024-03-09 ENCOUNTER — Other Ambulatory Visit (HOSPITAL_COMMUNITY): Payer: Self-pay

## 2024-03-09 ENCOUNTER — Encounter (HOSPITAL_COMMUNITY): Payer: Self-pay | Admitting: Internal Medicine

## 2024-03-09 DIAGNOSIS — F4310 Post-traumatic stress disorder, unspecified: Secondary | ICD-10-CM

## 2024-03-09 DIAGNOSIS — F331 Major depressive disorder, recurrent, moderate: Secondary | ICD-10-CM

## 2024-03-09 MED ORDER — GABAPENTIN 300 MG PO CAPS: 300.0000 mg | ORAL_CAPSULE | Freq: Every day | ORAL | 0 refills | Status: AC

## 2024-03-09 MED ORDER — TRAZODONE HCL 150 MG PO TABS: 150.0000 mg | ORAL_TABLET | Freq: Every day | ORAL | 0 refills | Status: AC

## 2024-03-12 ENCOUNTER — Encounter: Payer: Self-pay | Admitting: Dietician

## 2024-03-12 ENCOUNTER — Encounter (HOSPITAL_COMMUNITY): Payer: Self-pay | Admitting: Internal Medicine

## 2024-03-12 ENCOUNTER — Encounter: Attending: General Surgery | Admitting: Dietician

## 2024-03-12 ENCOUNTER — Ambulatory Visit (INDEPENDENT_AMBULATORY_CARE_PROVIDER_SITE_OTHER): Payer: Self-pay | Admitting: Family Medicine

## 2024-03-12 VITALS — BP 135/83 | HR 100 | Ht <= 58 in | Wt 201.4 lb

## 2024-03-12 DIAGNOSIS — E669 Obesity, unspecified: Secondary | ICD-10-CM | POA: Insufficient documentation

## 2024-03-12 DIAGNOSIS — K2100 Gastro-esophageal reflux disease with esophagitis, without bleeding: Secondary | ICD-10-CM | POA: Insufficient documentation

## 2024-03-12 DIAGNOSIS — H269 Unspecified cataract: Secondary | ICD-10-CM | POA: Diagnosis present

## 2024-03-12 DIAGNOSIS — Z01818 Encounter for other preprocedural examination: Secondary | ICD-10-CM

## 2024-03-12 DIAGNOSIS — Z9884 Bariatric surgery status: Secondary | ICD-10-CM | POA: Insufficient documentation

## 2024-03-12 DIAGNOSIS — K219 Gastro-esophageal reflux disease without esophagitis: Secondary | ICD-10-CM | POA: Insufficient documentation

## 2024-03-12 DIAGNOSIS — K449 Diaphragmatic hernia without obstruction or gangrene: Secondary | ICD-10-CM | POA: Insufficient documentation

## 2024-03-12 NOTE — Progress Notes (Signed)
 Supervised Weight Loss Visit Bariatric Nutrition Education Appt Start Time: 1023    End Time: 1047  Planned surgery: conversion from Sleeve Gastrectomy to Gastric By-Pass Pt expectation of surgery: relief from GERD/reflux/hiatal hernia  1 out of 6 SWL Appointments  Referral stated Supervised Weight Loss (SWL) visits needed: 6 Months  NUTRITION ASSESSMENT   Anthropometrics  Start weight at [facility]: 191.3 lbs (date: 02/23/2024)  Height: 58 in BMI: 39.98 kg/m2     Clinical  Medical hx: sleeve gastrectomy, GERD, sleep apnea, cancer, anemia, depression, asthma, osteoporosis Medications: vit D, Protonix , Zofran , Antivert , xyzal , gabapentin , Flonase , ferrous sulfate , Nexium, Lipitor, albuterol   Supplements: iron, vit D, B-12 shot once a month Labs: Vit D 19.4, A1c 5.7, iron saturation 14, chol 205, LDL 137 Allergies: lactose intolerant Notable signs/symptoms: none noted Any previous deficiencies? No  Lifestyle & Dietary Hx  Pt states she is going to have surgery on her left eye on Thursday, stating they will perform surgery on the right later. Pt states right now she can't see things far away, stating it is foggy, and states she can't read small print. Pt states she tries to take small bites and chew well, stating she still feels like some food gets stuck, stating she waits to see if it will go down by its self.  Estimated daily fluid intake: 64 oz (about 6-8 bottles of water) Supplements: iron, vit D, Vit B12 shot once a month Current average weekly physical activity: ADL's; walk to the store and walk the dog  24-Hr Dietary Recall First Meal: skip (drink like orange juice or apple juice) Snack:  Second Meal: apple (no food right now) Snack: fruit Third Meal: rice, chicken, peas and carrots Snack: sometimes a fruit cup Beverages: water, juice, soda  Alcoholic beverages per week: 0   Estimated Energy Needs Calories: 1400  NUTRITION DIAGNOSIS  Overweight/obesity (-3.3) related  to past poor dietary habits and physical inactivity as evidenced by patient w/ planned conversion from sleeve gastrectomy to gastric by-pass surgery following dietary guidelines for continued weight loss.  NUTRITION INTERVENTION  Nutrition counseling (C-1) and education (E-2) to facilitate bariatric surgery goals.  Starting your day with food instead of juice is an important habit when youre preparing for bariatric surgery. Juice digests quickly, offers little to no protein, and can leave you hungry soon after. Choosing a protein-containing breakfast--like eggs, yogurt, cottage cheese, or leftovers from dinner--helps you begin meeting your daily protein goal right away, keeps your blood sugar steadier, and reduces the urge to graze later in the morning. Building this routine now sets the tone for more balanced eating throughout the day and supports the habits youll rely on after surgery.  Pre-Op Goals Progress & New Goals Continue: Switch to non-caloric, non-carbonated and non-caffeinated beverages such as  water, unsweetened tea, Crystal Light and zero calorie beverages (aim for 64 oz. per day) Continue: practice not drinking with meals; stop drinking 15 minutes before eating and wait 30 minutes before drinking again. Continue: Eat every 3-5 hours; avoid skipping meals New: aim for a breakfast food instead of juice  Handouts Provided Include  Goals Printed Bariatric MyPlate  Learning Style & Readiness for Change Teaching method utilized: Visual & Auditory  Demonstrated degree of understanding via: Teach Back  Readiness Level: preparation Barriers to learning/adherence to lifestyle change: reflux; disability; financial limitations   RD's Notes for next Visit  Patient progress towards chosen goals  MONITORING & EVALUATION Dietary intake, weekly physical activity, body weight, and pre-op goals.  Next Steps  Patient is to return to [facility] in 1 month for next SWL visit.

## 2024-03-12 NOTE — Progress Notes (Signed)
" °  Patient Name: Melissa Gaines Date of Birth: 09/27/1970 Date of Visit: 03/12/2024 PCP: Alba Sharper, MD  Chief Complaint: preoperative evaluation Surgeon:  [facility] Surgery: L cataract Date of Procedure: 03/15/2024  Subjective: Melissa Gaines is a pleasant 54 y.o. with medical history significant for antiphospholipid antibody syndrome, SLE, HTN, asthma, SBO, presenting today for pre operative evaluation.   Recently increased trazodone  to 150mg  nightly Taking a PPI but not sure which one Does not have preop paperwork from ophthalmology office  The patient can walk 1 city block without difficulty. Not walking much due to poor vision  Prior surgeries: hip replacement, c-section, hearing aid placement, CABG  Review of systems: Chest pain with exertion? No Dyspnea on exertion? No Can you walk 2 blocks without dyspnea or chest pain? No- dyspnea Can you walk up a flight of stairs without chest pain or dyspnea? No Any history of easy bruising or bleeding? no Any family history of bleeding diathesis? No Any personal or family of difficulty with anesthesia? No Any history of sleep apnea? Yes  Medical History: Cardiac conditions? no History of VTE? Yes- APLA syndrome on Eliquis  History of adverse surgical outcome? no  Diabetes? no Obesity? yes OSA? Yes- does not use CPAP currently   I have reviewed the patient's medical, surgical, family, and social history as appropriate.   Vitals:   03/12/24 1324  BP: 135/83  Pulse: 100  SpO2: 99%   Filed Weights   03/12/24 1324  Weight: 201 lb 6.4 oz (91.4 kg)     Diagnoses and all orders for this visit:  Cataract of both eyes, unspecified cataract type  Pre-operative examination   Patient is low risk for a low risk surgery.  The patient has been medically optimized for this procedure. In addition,  I make the following recommendations for further evaluation and medication adjustments prior to their   planned procedure:  Continue all medications as usual   Rea Raring, MD  Family Medicine Teaching Service   "

## 2024-03-12 NOTE — Patient Instructions (Signed)
 Thank you for coming in today! Here is a summary of what we discussed:  -I have determined that you are medically optimized for your cataract surgery. Please continue taking all your medicines as usual.  Please call the clinic at [phone] if your symptoms worsen or you have any concerns.  Best, Dr Adele

## 2024-03-13 ENCOUNTER — Encounter (HOSPITAL_COMMUNITY): Payer: Self-pay | Admitting: Internal Medicine

## 2024-03-14 ENCOUNTER — Inpatient Hospital Stay: Attending: Nurse Practitioner

## 2024-03-14 ENCOUNTER — Encounter (HOSPITAL_COMMUNITY): Payer: Self-pay | Admitting: Internal Medicine

## 2024-03-14 DIAGNOSIS — D508 Other iron deficiency anemias: Secondary | ICD-10-CM

## 2024-03-14 MED ORDER — CYANOCOBALAMIN 1000 MCG/ML IJ SOLN
1000.0000 ug | INTRAMUSCULAR | Status: DC
  Administered 2024-03-14: 1000 ug via INTRAMUSCULAR
  Filled 2024-03-14: qty 1

## 2024-03-16 ENCOUNTER — Ambulatory Visit: Admitting: *Deleted

## 2024-03-16 ENCOUNTER — Encounter (HOSPITAL_COMMUNITY): Payer: Self-pay | Admitting: Internal Medicine

## 2024-03-16 DIAGNOSIS — J302 Other seasonal allergic rhinitis: Secondary | ICD-10-CM | POA: Diagnosis not present

## 2024-03-19 ENCOUNTER — Ambulatory Visit (HOSPITAL_COMMUNITY): Admitting: Licensed Clinical Social Worker

## 2024-03-19 ENCOUNTER — Encounter (HOSPITAL_COMMUNITY): Payer: Self-pay | Admitting: Internal Medicine

## 2024-03-19 DIAGNOSIS — Z8659 Personal history of other mental and behavioral disorders: Secondary | ICD-10-CM | POA: Diagnosis not present

## 2024-03-19 DIAGNOSIS — F4323 Adjustment disorder with mixed anxiety and depressed mood: Secondary | ICD-10-CM | POA: Diagnosis not present
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
## 2024-03-23 ENCOUNTER — Ambulatory Visit (INDEPENDENT_AMBULATORY_CARE_PROVIDER_SITE_OTHER)

## 2024-03-23 DIAGNOSIS — J302 Other seasonal allergic rhinitis: Secondary | ICD-10-CM

## 2024-03-26 ENCOUNTER — Encounter (HOSPITAL_COMMUNITY): Payer: Self-pay | Admitting: Internal Medicine

## 2024-03-27 ENCOUNTER — Encounter (HOSPITAL_COMMUNITY): Payer: Self-pay | Admitting: Internal Medicine

## 2024-03-27 ENCOUNTER — Ambulatory Visit
Admission: RE | Admit: 2024-03-27 | Discharge: 2024-03-27 | Disposition: A | Source: Ambulatory Visit | Attending: Family Medicine | Admitting: Family Medicine

## 2024-03-27 DIAGNOSIS — Z1231 Encounter for screening mammogram for malignant neoplasm of breast: Secondary | ICD-10-CM

## 2024-03-30 ENCOUNTER — Ambulatory Visit (INDEPENDENT_AMBULATORY_CARE_PROVIDER_SITE_OTHER)

## 2024-03-30 DIAGNOSIS — J302 Other seasonal allergic rhinitis: Secondary | ICD-10-CM | POA: Diagnosis not present

## 2024-04-11 ENCOUNTER — Inpatient Hospital Stay: Attending: Nurse Practitioner

## 2024-04-11 VITALS — BP 123/70 | HR 80 | Temp 98.1°F | Resp 18

## 2024-04-11 DIAGNOSIS — D508 Other iron deficiency anemias: Secondary | ICD-10-CM

## 2024-04-11 MED ORDER — CYANOCOBALAMIN 1000 MCG/ML IJ SOLN
1000.0000 ug | INTRAMUSCULAR | Status: DC
  Administered 2024-04-11: 1000 ug via INTRAMUSCULAR
  Filled 2024-04-11: qty 1

## 2024-04-12 ENCOUNTER — Encounter: Payer: Self-pay | Admitting: Dietician

## 2024-04-12 ENCOUNTER — Encounter: Admitting: Dietician

## 2024-04-12 VITALS — Ht <= 58 in | Wt 199.2 lb

## 2024-04-12 DIAGNOSIS — K219 Gastro-esophageal reflux disease without esophagitis: Secondary | ICD-10-CM

## 2024-04-12 DIAGNOSIS — E669 Obesity, unspecified: Secondary | ICD-10-CM

## 2024-04-12 NOTE — Progress Notes (Signed)
 Supervised Weight Loss Visit Bariatric Nutrition Education Appt Start Time: 1221    End Time: 1236  Planned surgery: conversion from Sleeve Gastrectomy to Gastric By-Pass Pt expectation of surgery: relief from GERD/reflux/hiatal hernia  2 out of 6 SWL Appointments  Referral stated Supervised Weight Loss (SWL) visits needed: 6 Months  NUTRITION ASSESSMENT   Anthropometrics  Start weight at [facility]: 191.3 lbs (date: 02/23/2024)  Height: 58 in Weight today: 199.2 lbs BMI: 41.63 kg/m2     Clinical  Medical hx: sleeve gastrectomy, GERD, sleep apnea, cancer, anemia, depression, asthma, osteoporosis Medications: vit D, Protonix , Zofran , Antivert , xyzal , gabapentin , Flonase , ferrous sulfate , Nexium, Lipitor, albuterol   Supplements: iron, vit D, B-12 shot once a month Labs: Vit D 19.4, A1c 5.7, iron saturation 14, chol 205, LDL 137 Allergies: lactose intolerant Notable signs/symptoms: none noted Any previous deficiencies? No  Lifestyle & Dietary Hx  Pt arrived with her son. Pt had eye surgery on her right eye, stating she already had surgery on her left eye. Pt states she could do nuts or cheese with her fruit. Pt states she is eating more food at breakfast, stating not juice. Pt states she is agreeable to track her protein, aiming for a protein with every meal or snack.  Estimated daily fluid intake: 64 oz (about 6-8 bottles of water) Supplements: iron, vit D, Vit B12 shot once a month Current average weekly physical activity: ADL's; walk to the store and walk the dog  24-Hr Dietary Recall First Meal: cereal with lactaid milk or boiled egg with toast Snack:  Second Meal: apple or sandwich (ham and cheese or chicken salad) Snack: fruit Third Meal: rice, chicken, peas and carrots Snack: sometimes a fruit cup Beverages: water, juice, soda  Alcoholic beverages per week: 0   Estimated Energy Needs Calories: 1400  NUTRITION DIAGNOSIS  Overweight/obesity ([place]-3.3) related to past  poor dietary habits and physical inactivity as evidenced by patient w/ planned conversion from sleeve gastrectomy to gastric by-pass surgery following dietary guidelines for continued weight loss.  NUTRITION INTERVENTION  Nutrition counseling (C-1) and education (E-2) to facilitate bariatric surgery goals.  Aiming for a source of protein at every meal or snack--and tracking it to reach about 60 grams per day--helps you build steady habits before bariatric surgery. Using the Nutrition Facts label makes tracking easier, since you can check the protein amount based on the serving size, and a food scale can help you measure portions accurately. For meats, a simple rule of thumb is that each ounce provides about 7 grams of protein, and you can estimate 3 ounces by comparing it to the size of your palm or a deck of cards. This approach keeps your intake consistent and helps you understand how much protein youre truly getting from food.  Pre-Op Goals Progress & New Goals Continue: Switch to non-caloric, non-carbonated and non-caffeinated beverages such as  water, unsweetened tea, Crystal Light and zero calorie beverages (aim for 64 oz. per day) Continue: practice not drinking with meals; stop drinking 15 minutes before eating and wait 30 minutes before drinking again. Continue: Eat every 3-5 hours; avoid skipping meals Continue: aim for a breakfast food instead of juice New: aim for a protein with every meal or snack; track protein; aim for 60 grams of protein.  Handouts Provided Include  Goals Printed Bariatric MyPlate  Learning Style & Readiness for Change Teaching method utilized: Visual & Auditory  Demonstrated degree of understanding via: Teach Back  Readiness Level: preparation Barriers to learning/adherence to lifestyle  change: reflux; disability; financial limitations   RD's Notes for next Visit  Patient progress towards chosen goals  MONITORING & EVALUATION Dietary intake, weekly  physical activity, body weight, and pre-op goals.  Next Steps  Patient is to return to [facility] in 1 month for next SWL visit.

## 2024-04-13 ENCOUNTER — Telehealth: Payer: Self-pay | Admitting: Neurology

## 2024-04-13 NOTE — Telephone Encounter (Signed)
 [facility] Call ID: [id number]  [facility]: [phone]  Caller states she would like a referral to a provider in [place].

## 2024-05-07 ENCOUNTER — Ambulatory Visit: Admitting: Allergy

## 2024-05-10 ENCOUNTER — Encounter: Admitting: Dietician

## 2024-05-16 ENCOUNTER — Ambulatory Visit: Admitting: Neurology

## 2024-05-21 ENCOUNTER — Other Ambulatory Visit

## 2024-05-21 ENCOUNTER — Ambulatory Visit

## 2024-05-21 ENCOUNTER — Ambulatory Visit: Admitting: Hematology

## 2024-05-24 ENCOUNTER — Ambulatory Visit (HOSPITAL_COMMUNITY): Admitting: Psychiatry

## 2024-09-20 ENCOUNTER — Encounter

## 2024-10-26 ENCOUNTER — Ambulatory Visit: Admitting: Internal Medicine
